# Patient Record
Sex: Female | Born: 1950
Health system: Southern US, Community
[De-identification: ages and names within clinical notes are randomized; demographics above are authoritative.]

## PROBLEM LIST (undated history)

## (undated) DIAGNOSIS — F32A Depression, unspecified: Secondary | ICD-10-CM

## (undated) DIAGNOSIS — K635 Polyp of colon: Secondary | ICD-10-CM

## (undated) DIAGNOSIS — I10 Essential (primary) hypertension: Secondary | ICD-10-CM

## (undated) DIAGNOSIS — G4700 Insomnia, unspecified: Secondary | ICD-10-CM

## (undated) DIAGNOSIS — F329 Major depressive disorder, single episode, unspecified: Secondary | ICD-10-CM

## (undated) DIAGNOSIS — K219 Gastro-esophageal reflux disease without esophagitis: Secondary | ICD-10-CM

## (undated) DIAGNOSIS — G43909 Migraine, unspecified, not intractable, without status migrainosus: Secondary | ICD-10-CM

## (undated) DIAGNOSIS — F419 Anxiety disorder, unspecified: Secondary | ICD-10-CM

## (undated) DIAGNOSIS — M810 Age-related osteoporosis without current pathological fracture: Secondary | ICD-10-CM

## (undated) DIAGNOSIS — F1320 Sedative, hypnotic or anxiolytic dependence, uncomplicated: Secondary | ICD-10-CM

## (undated) DIAGNOSIS — I2699 Other pulmonary embolism without acute cor pulmonale: Secondary | ICD-10-CM

## (undated) DIAGNOSIS — N3281 Overactive bladder: Secondary | ICD-10-CM

## (undated) DIAGNOSIS — J449 Chronic obstructive pulmonary disease, unspecified: Secondary | ICD-10-CM

## (undated) DIAGNOSIS — M1990 Unspecified osteoarthritis, unspecified site: Secondary | ICD-10-CM

## (undated) DIAGNOSIS — K5900 Constipation, unspecified: Secondary | ICD-10-CM

## (undated) DIAGNOSIS — E785 Hyperlipidemia, unspecified: Secondary | ICD-10-CM

## (undated) DIAGNOSIS — E890 Postprocedural hypothyroidism: Secondary | ICD-10-CM

## (undated) DIAGNOSIS — M419 Scoliosis, unspecified: Secondary | ICD-10-CM

## (undated) HISTORY — DX: Depression, unspecified: F32.A

## (undated) HISTORY — DX: Essential (primary) hypertension: I10

## (undated) HISTORY — DX: Insomnia, unspecified: G47.00

## (undated) HISTORY — DX: Unspecified osteoarthritis, unspecified site: M19.90

## (undated) HISTORY — DX: Migraine, unspecified, not intractable, without status migrainosus: G43.909

## (undated) HISTORY — PX: HEMORRHOID SURGERY: SHX153

## (undated) HISTORY — DX: Chronic obstructive pulmonary disease, unspecified: J44.9

## (undated) HISTORY — DX: Other pulmonary embolism without acute cor pulmonale: I26.99

## (undated) HISTORY — DX: Major depressive disorder, single episode, unspecified: F32.9

## (undated) HISTORY — DX: Sedative, hypnotic or anxiolytic dependence, uncomplicated: F13.20

## (undated) HISTORY — DX: Scoliosis, unspecified: M41.9

## (undated) HISTORY — DX: Polyp of colon: K63.5

## (undated) HISTORY — DX: Hyperlipidemia, unspecified: E78.5

## (undated) HISTORY — DX: Gastro-esophageal reflux disease without esophagitis: K21.9

## (undated) HISTORY — DX: Postprocedural hypothyroidism: E89.0

## (undated) HISTORY — PX: BREAST CYST ASPIRATION: SHX578

## (undated) HISTORY — DX: Anxiety disorder, unspecified: F41.9

## (undated) HISTORY — DX: Age-related osteoporosis without current pathological fracture: M81.0

## (undated) HISTORY — PX: THYROIDECTOMY: SHX17

## (undated) HISTORY — DX: Overactive bladder: N32.81

## (undated) HISTORY — DX: Constipation, unspecified: K59.00

---

## 1977-08-01 HISTORY — PX: TUBAL LIGATION: SHX77

## 1997-11-03 ENCOUNTER — Encounter: Admission: RE | Admit: 1997-11-03 | Discharge: 1998-02-01 | Payer: Self-pay | Admitting: Internal Medicine

## 1998-08-05 ENCOUNTER — Ambulatory Visit (HOSPITAL_COMMUNITY): Admission: RE | Admit: 1998-08-05 | Discharge: 1998-08-05 | Payer: Self-pay | Admitting: Family Medicine

## 1998-08-05 ENCOUNTER — Encounter: Payer: Self-pay | Admitting: Family Medicine

## 1998-09-08 ENCOUNTER — Ambulatory Visit (HOSPITAL_COMMUNITY): Admission: RE | Admit: 1998-09-08 | Discharge: 1998-09-08 | Payer: Self-pay | Admitting: Internal Medicine

## 1999-05-18 ENCOUNTER — Ambulatory Visit (HOSPITAL_COMMUNITY): Admission: RE | Admit: 1999-05-18 | Discharge: 1999-05-18 | Payer: Self-pay | Admitting: Family Medicine

## 1999-05-18 ENCOUNTER — Encounter: Payer: Self-pay | Admitting: Family Medicine

## 2000-07-10 ENCOUNTER — Emergency Department (HOSPITAL_COMMUNITY): Admission: EM | Admit: 2000-07-10 | Discharge: 2000-07-10 | Payer: Self-pay | Admitting: Emergency Medicine

## 2000-07-14 ENCOUNTER — Emergency Department (HOSPITAL_COMMUNITY): Admission: EM | Admit: 2000-07-14 | Discharge: 2000-07-14 | Payer: Self-pay | Admitting: Emergency Medicine

## 2000-07-14 ENCOUNTER — Encounter: Payer: Self-pay | Admitting: Emergency Medicine

## 2000-10-05 ENCOUNTER — Encounter: Payer: Self-pay | Admitting: Endocrinology

## 2000-10-05 ENCOUNTER — Ambulatory Visit (HOSPITAL_COMMUNITY): Admission: RE | Admit: 2000-10-05 | Discharge: 2000-10-05 | Payer: Self-pay | Admitting: Endocrinology

## 2001-02-13 ENCOUNTER — Emergency Department (HOSPITAL_COMMUNITY): Admission: EM | Admit: 2001-02-13 | Discharge: 2001-02-14 | Payer: Self-pay | Admitting: Emergency Medicine

## 2001-05-10 ENCOUNTER — Emergency Department (HOSPITAL_COMMUNITY): Admission: EM | Admit: 2001-05-10 | Discharge: 2001-05-10 | Payer: Self-pay | Admitting: Emergency Medicine

## 2007-12-24 ENCOUNTER — Emergency Department (HOSPITAL_COMMUNITY): Admission: EM | Admit: 2007-12-24 | Discharge: 2007-12-24 | Payer: Self-pay | Admitting: Emergency Medicine

## 2016-05-12 LAB — HM COLONOSCOPY

## 2016-12-07 DIAGNOSIS — G2581 Restless legs syndrome: Secondary | ICD-10-CM | POA: Insufficient documentation

## 2016-12-07 DIAGNOSIS — J449 Chronic obstructive pulmonary disease, unspecified: Secondary | ICD-10-CM | POA: Insufficient documentation

## 2016-12-07 DIAGNOSIS — F17210 Nicotine dependence, cigarettes, uncomplicated: Secondary | ICD-10-CM | POA: Insufficient documentation

## 2016-12-07 DIAGNOSIS — F411 Generalized anxiety disorder: Secondary | ICD-10-CM | POA: Insufficient documentation

## 2017-02-06 DIAGNOSIS — Z86010 Personal history of colonic polyps: Secondary | ICD-10-CM | POA: Insufficient documentation

## 2017-02-14 ENCOUNTER — Telehealth: Payer: Self-pay | Admitting: Family Medicine

## 2017-02-14 NOTE — Telephone Encounter (Signed)
Rcvd office notes, labs, colonoscopy from Dr Arnell AsalPaul Cohen

## 2017-04-11 ENCOUNTER — Ambulatory Visit: Payer: Self-pay | Admitting: Family Medicine

## 2017-04-20 ENCOUNTER — Ambulatory Visit: Payer: Self-pay | Admitting: Family Medicine

## 2017-05-02 ENCOUNTER — Ambulatory Visit (INDEPENDENT_AMBULATORY_CARE_PROVIDER_SITE_OTHER): Payer: Medicare Other | Admitting: Family Medicine

## 2017-05-02 ENCOUNTER — Encounter: Payer: Self-pay | Admitting: Family Medicine

## 2017-05-02 VITALS — BP 120/80 | HR 72 | Ht 59.5 in | Wt 131.0 lb

## 2017-05-02 DIAGNOSIS — F1320 Sedative, hypnotic or anxiolytic dependence, uncomplicated: Secondary | ICD-10-CM | POA: Diagnosis not present

## 2017-05-02 DIAGNOSIS — F17200 Nicotine dependence, unspecified, uncomplicated: Secondary | ICD-10-CM

## 2017-05-02 DIAGNOSIS — K635 Polyp of colon: Secondary | ICD-10-CM | POA: Insufficient documentation

## 2017-05-02 DIAGNOSIS — G4700 Insomnia, unspecified: Secondary | ICD-10-CM | POA: Insufficient documentation

## 2017-05-02 DIAGNOSIS — F32A Depression, unspecified: Secondary | ICD-10-CM | POA: Insufficient documentation

## 2017-05-02 DIAGNOSIS — Z720 Tobacco use: Secondary | ICD-10-CM | POA: Insufficient documentation

## 2017-05-02 DIAGNOSIS — E890 Postprocedural hypothyroidism: Secondary | ICD-10-CM | POA: Diagnosis not present

## 2017-05-02 DIAGNOSIS — K5900 Constipation, unspecified: Secondary | ICD-10-CM | POA: Insufficient documentation

## 2017-05-02 DIAGNOSIS — J449 Chronic obstructive pulmonary disease, unspecified: Secondary | ICD-10-CM | POA: Insufficient documentation

## 2017-05-02 DIAGNOSIS — N3281 Overactive bladder: Secondary | ICD-10-CM | POA: Diagnosis not present

## 2017-05-02 DIAGNOSIS — K5909 Other constipation: Secondary | ICD-10-CM | POA: Insufficient documentation

## 2017-05-02 DIAGNOSIS — G43909 Migraine, unspecified, not intractable, without status migrainosus: Secondary | ICD-10-CM | POA: Insufficient documentation

## 2017-05-02 DIAGNOSIS — I10 Essential (primary) hypertension: Secondary | ICD-10-CM | POA: Insufficient documentation

## 2017-05-02 DIAGNOSIS — K219 Gastro-esophageal reflux disease without esophagitis: Secondary | ICD-10-CM | POA: Diagnosis not present

## 2017-05-02 DIAGNOSIS — E785 Hyperlipidemia, unspecified: Secondary | ICD-10-CM | POA: Insufficient documentation

## 2017-05-02 DIAGNOSIS — Z7689 Persons encountering health services in other specified circumstances: Secondary | ICD-10-CM

## 2017-05-02 DIAGNOSIS — M1990 Unspecified osteoarthritis, unspecified site: Secondary | ICD-10-CM | POA: Insufficient documentation

## 2017-05-02 DIAGNOSIS — F329 Major depressive disorder, single episode, unspecified: Secondary | ICD-10-CM | POA: Insufficient documentation

## 2017-05-02 DIAGNOSIS — F419 Anxiety disorder, unspecified: Secondary | ICD-10-CM | POA: Insufficient documentation

## 2017-05-02 DIAGNOSIS — M419 Scoliosis, unspecified: Secondary | ICD-10-CM | POA: Insufficient documentation

## 2017-05-02 LAB — COMPREHENSIVE METABOLIC PANEL
AG RATIO: 1.8 (calc) (ref 1.0–2.5)
ALT: 9 U/L (ref 6–29)
AST: 15 U/L (ref 10–35)
Albumin: 4.4 g/dL (ref 3.6–5.1)
Alkaline phosphatase (APISO): 97 U/L (ref 33–130)
BILIRUBIN TOTAL: 0.4 mg/dL (ref 0.2–1.2)
BUN: 10 mg/dL (ref 7–25)
CALCIUM: 9.9 mg/dL (ref 8.6–10.4)
CO2: 25 mmol/L (ref 20–32)
Chloride: 96 mmol/L — ABNORMAL LOW (ref 98–110)
Creat: 0.78 mg/dL (ref 0.50–0.99)
GLUCOSE: 84 mg/dL (ref 65–99)
Globulin: 2.4 g/dL (calc) (ref 1.9–3.7)
Potassium: 3.8 mmol/L (ref 3.5–5.3)
Sodium: 132 mmol/L — ABNORMAL LOW (ref 135–146)
Total Protein: 6.8 g/dL (ref 6.1–8.1)

## 2017-05-02 LAB — CBC WITH DIFFERENTIAL/PLATELET
BASOS ABS: 22 {cells}/uL (ref 0–200)
Basophils Relative: 0.3 %
EOS ABS: 110 {cells}/uL (ref 15–500)
Eosinophils Relative: 1.5 %
HCT: 36.5 % (ref 35.0–45.0)
HEMOGLOBIN: 12.6 g/dL (ref 11.7–15.5)
Lymphs Abs: 1978 cells/uL (ref 850–3900)
MCH: 31.3 pg (ref 27.0–33.0)
MCHC: 34.5 g/dL (ref 32.0–36.0)
MCV: 90.8 fL (ref 80.0–100.0)
MONOS PCT: 5 %
MPV: 10.7 fL (ref 7.5–12.5)
NEUTROS ABS: 4825 {cells}/uL (ref 1500–7800)
NEUTROS PCT: 66.1 %
Platelets: 171 10*3/uL (ref 140–400)
RBC: 4.02 10*6/uL (ref 3.80–5.10)
RDW: 11.8 % (ref 11.0–15.0)
Total Lymphocyte: 27.1 %
WBC mixed population: 365 cells/uL (ref 200–950)
WBC: 7.3 10*3/uL (ref 3.8–10.8)

## 2017-05-02 LAB — LIPID PANEL
CHOLESTEROL: 200 mg/dL — AB (ref <200)
HDL: 53 mg/dL (ref >50)
LDL Cholesterol (Calc): 118 mg/dL (calc) — ABNORMAL HIGH
Non-HDL Cholesterol (Calc): 147 mg/dL (calc) — ABNORMAL HIGH (ref <130)
Total CHOL/HDL Ratio: 3.8 (calc) (ref <5.0)
Triglycerides: 175 mg/dL — ABNORMAL HIGH (ref <150)

## 2017-05-02 LAB — TSH: TSH: 0.48 mIU/L (ref 0.40–4.50)

## 2017-05-02 MED ORDER — ESOMEPRAZOLE MAGNESIUM 40 MG PO CPDR: 40.0000 mg | DELAYED_RELEASE_CAPSULE | Freq: Every day | ORAL | 3 refills | Status: DC

## 2017-05-02 NOTE — Progress Notes (Signed)
Subjective:    Patient ID: Brenda Dunn, female    DOB: 1951-07-28, 66 y.o.   MRN: [id number]  HPI Chief Complaint  Patient presents with  . new pt    new pt get established. needs refill on meds. would like thyroid checked and cholesterol checked. slow digestive problems.    She is new to the practice and is here to establish care.  Previous medical care: Dr. Iris Pert at [facility] PCP since May 2018 and prior to that was seeing Dr. Arnell Asal in [facility].  Last CPE: unknown.   Other providers: [facility] - Dr. Andrey Cota she would like to see a new GI when she is due for coloscopy and to help with her chronic constipation.  Urologist in [facility]  Podiatrist- here in town.   Has been referred to [facility] psychiatry and pain management in the past. Did not establish with either.    Dr. Corie Chiquito at [facility] is her psychiatrist. She saw her 2 months ago. She has an appointment later this month. She does not have a therapist and is not interested in seeing one.  History of anxiety and has been taking benzodiazepines for many years.   States she is having a lot of gas.  States she drinks a lot of Diet Coke, eats gas producing foods.   HTN- well controlled.  Hyperlipidemia - LDL within goal in June 2018  GERD- states she is not having indigestion. She would like to be switched back to Nexium.  Hypothyroidism post thyroidectomy - TSH and free T4 normal in July 2018 COPD- states she has been told she has mild emphysema. She was pulmonologist.  OAB- Myrbetriq seems to be helping. Requests refill because she states she cannot find her last 30 day supply and pharmacy said she is not due for a refill until November.   She is visibly upset about her son passing away years ago. She is tearful.   Social history: She is married and lives with husband, has been disables since 2010 for severe anxiety and scoliosis per medical record.   Smoker- is not willing to quit. States she has  been smoking since age 32. 1-2 ppd.   Immunizations: Prevnar 13 -09/26/2016  Health maintenance:  Mammogram: 05/2016  Colonoscopy: 05/2016. Polyps. Due back in 05/2019  Last Gynecological Exam: Pap smear has been years ago.  Mother with breast cancer.   Reviewed allergies, medications, past medical, surgical, family, and social history.   Review of Systems Pertinent positives and negatives in the history of present illness.     Objective:   Physical Exam BP 120/80   Pulse 72   Ht 4' 11.5" (1.511 m)   Wt 131 lb (59.4 kg)   BMI 26.02 kg/m  Alert and oriented and in no acute distress. Not otherwise examined.       Assessment & Plan:  Hypertension, unspecified type - Plan: CBC with Differential/Platelet, Comprehensive metabolic panel  Post-surgical hypothyroidism - Plan: TSH  Hyperlipidemia, unspecified hyperlipidemia type - Plan: Lipid panel  Benzodiazepine dependence (HCC)  Encounter to establish care  Smoker  OAB (overactive bladder)  Gastroesophageal reflux disease, esophagitis presence not specified - Plan: esomeprazole (NEXIUM) 40 MG capsule  HTN- well controlled and at goal today. Continue on current medications.  Will check thyroid function and change medication dose if needed.  She is fasting today, lipid panel checked per patient request.  Smoking cessation counseling done and she is not ready to stop.  Myrbetriq  sample given #7 tablets. Will refill this as appropriate.  GERD- counseled on reducing gas producing foods and carbonated beverages since this gives her more gas. Stop omeprazole and start Nexium per patient request.  She is aware that I am not interested in treating her chronic anxiety, sleep issues or other psychiatric issues. She will continue seeing her psychiatrist for this. She is dependent on benzodiazepines and verbalized understanding that I will not prescribe these for her.  Offered counseling for anxiety and she declines.  Follow up  pending labs.  She is also due for an AWV. She will schedule this.  Spent a minimum of 45 minutes face to face with patient and at least 50% was in counseling and coordination of care.

## 2017-05-02 NOTE — Patient Instructions (Addendum)
Stop the Omeprazole and start Nexium. Try to avoid carbonated beverages and high gas producing foods such as raw vegetables, onions, beans, cabbages. Avoid eating and laying down.  Smoking also makes reflux symptoms worse.  Let me know if you are ready to stop smoking and we can help.   Return in the next few months for your annual wellness visit.   We will call you with lab results.

## 2017-05-05 ENCOUNTER — Telehealth: Payer: Self-pay

## 2017-05-05 NOTE — Telephone Encounter (Signed)
Records from [facility] placed in your folder for review. Trixie Rude

## 2017-05-08 ENCOUNTER — Encounter: Payer: Self-pay | Admitting: Family Medicine

## 2017-05-10 ENCOUNTER — Ambulatory Visit (INDEPENDENT_AMBULATORY_CARE_PROVIDER_SITE_OTHER): Payer: Medicare Other | Admitting: Family Medicine

## 2017-05-10 ENCOUNTER — Encounter: Payer: Self-pay | Admitting: Family Medicine

## 2017-05-10 VITALS — BP 124/80 | HR 69 | Wt 133.0 lb

## 2017-05-10 DIAGNOSIS — E871 Hypo-osmolality and hyponatremia: Secondary | ICD-10-CM | POA: Diagnosis not present

## 2017-05-10 DIAGNOSIS — R059 Cough, unspecified: Secondary | ICD-10-CM

## 2017-05-10 DIAGNOSIS — R05 Cough: Secondary | ICD-10-CM

## 2017-05-10 DIAGNOSIS — E785 Hyperlipidemia, unspecified: Secondary | ICD-10-CM

## 2017-05-10 LAB — POCT URINALYSIS DIP (PROADVANTAGE DEVICE)
BILIRUBIN UA: NEGATIVE mg/dL
Bilirubin, UA: NEGATIVE
Glucose, UA: 500 mg/dL — AB
Nitrite, UA: NEGATIVE
PH UA: 6 (ref 5.0–8.0)
PROTEIN UA: NEGATIVE mg/dL
RBC UA: NEGATIVE
SPECIFIC GRAVITY, URINE: 1.02
UUROB: NEGATIVE

## 2017-05-10 NOTE — Progress Notes (Signed)
   Subjective:    Patient ID: Brenda Dunn, female    DOB: [birth year], 66 y.o.   MRN: [id number]  HPI Chief Complaint  Patient presents with  . discuss lab    discuss labs, increased water intake   She is here to discuss hyponatremia and hyperlipidemia.   Sodium was 132 at her last visit without previous history of hyponatremia per patient.    Her LDL was 118 and above goal. Denies missing any doses of atorvastatin.   States she is drinking approximately 16 ounces of water, 2-3 sodas per day and 6-8 cups of coffee in the morning,1-2 cups in the afternoon. States she does not eat a lot of salt.   Denies fever, chills, confusion, fatigue, dizziness, chest pain, palpitations, shortness of breath, abdominal pain, N/V/D, urinary symptoms, LE edema, muscle weakness or cramping.   States she has been coughing for the past 3-4 days and thinks she has bronchitis. Cough is dry. She used her albuterol inhaler 2 times daily for the past 2 days and states she does not usually need it.  History of "mild" COPD per patient.  She is a smoker.  States she has not had bronchitis in years.   Denies rhinorrhea, nasal congestion, ear pain, sore throat.   States she has seen an allergist in the past and states she has allergy to ragweed.   Walks 3-4 days per week.  She is concerned about weight gain but states she has not noticed any difference in her clothing sizes.   Reviewed allergies, medications, past medical, surgical, and social history.   Review of Systems Pertinent positives and negatives in the history of present illness.     Objective:   Physical Exam BP 124/80   Pulse 69   Wt 133 lb (60.3 kg)   BMI 26.41 kg/m   Alert and in no distress. No sinus tenderness. Nares with erythema, no edema or drainage. Tympanic membranes and canals are normal. Pharyngeal area is normal. Neck is supple without adenopathy or thyromegaly. Cardiac exam shows a regular sinus rhythm without murmurs or  gallops. Lungs are clear to auscultation. extremities without edema, normal pulses. Skin is warm and dry, no pallor or rash. PERRLA.  CN II-IX intact. No tremor. MSK strength is normal. DTRs normal.      Assessment & Plan:  Hyponatremia - Plan: POCT Urinalysis DIP (Proadvantage Device), Osmolality, Osmolality, urine, Hemoglobin A1c, BASIC METABOLIC PANEL WITH GFR  Cough  Hyperlipidemia, unspecified hyperlipidemia type  Counseled on cutting back on daily fluids and increasing her salt intake. She is euvolemic.  Will recheck BMP. Plan to look for underlying etiologies for hyponatremia but I suspect this is due to her significant fluid intake and low sodium diet.  Advised her to eat a low cholesterol diet, increase physical activity and continue on statin. She is not at goal and we will follow up on this in 6 months.  She appears to have a cold at this point. Recommend OTC medication with Mucinex. She will follow up if she worsens or if she is not improving in the next 2-3 days.

## 2017-05-10 NOTE — Patient Instructions (Signed)
Take Mucinex for cough and congestion. Continue using albuterol inhaler as needed.  If your cough gets worse or if you develop fever any new symptoms over the next week then let me know.   We will call you with lab results. In the meantime, increase your salt intake and cut back some on the fluids you are drinking.

## 2017-05-11 LAB — BASIC METABOLIC PANEL WITH GFR
BUN: 10 mg/dL (ref 7–25)
CHLORIDE: 93 mmol/L — AB (ref 98–110)
CO2: 24 mmol/L (ref 20–32)
Calcium: 9.3 mg/dL (ref 8.6–10.4)
Creat: 0.72 mg/dL (ref 0.50–0.99)
GFR, EST AFRICAN AMERICAN: 102 mL/min/{1.73_m2} (ref >=60)
GFR, Est Non African American: 88 mL/min/{1.73_m2} (ref >=60)
GLUCOSE: 75 mg/dL (ref 65–99)
POTASSIUM: 4.4 mmol/L (ref 3.5–5.3)
SODIUM: 126 mmol/L — AB (ref 135–146)

## 2017-05-11 LAB — HEMOGLOBIN A1C
HEMOGLOBIN A1C: 5.1 %{Hb} (ref <5.7)
Mean Plasma Glucose: 100 (calc)
eAG (mmol/L): 5.5 (calc)

## 2017-05-11 LAB — OSMOLALITY, URINE: OSMOLALITY UR: 376 mosm/kg (ref 50–1200)

## 2017-05-11 LAB — OSMOLALITY: Osmolality: 260 mOsm/kg — ABNORMAL LOW (ref 278–305)

## 2017-05-12 ENCOUNTER — Telehealth: Payer: Self-pay

## 2017-05-12 MED ORDER — AZITHROMYCIN 250 MG PO TABS: ORAL_TABLET | ORAL | 0 refills | Status: DC

## 2017-05-12 MED ORDER — PROMETHAZINE-DM 6.25-15 MG/5ML PO SYRP: 5.0000 mL | ORAL_SOLUTION | Freq: Every evening | ORAL | 0 refills | Status: DC | PRN

## 2017-05-12 NOTE — Telephone Encounter (Signed)
Pt can take zithromax and wants something for cough as she can not sleep. Per vickie ok to send in promethazine DM cough syrup

## 2017-05-12 NOTE — Telephone Encounter (Signed)
Pt states that she is getting worse. She states she is unable to get out and come to the office and requesting something be called in for her cough. She is not able to sleep at night and is exhausted because she is not able to rest. She is trying Mucinex and inhalers as directed. Trixie Rude

## 2017-05-12 NOTE — Telephone Encounter (Signed)
Please find out if she can take Zithromax (Z-pack) antibiotic. If so please send this in for her. Have her follow up if she is not improving after completing the antibiotic. If she develops fever or shortness of breath over the weekend she may have to go to urgent care.

## 2017-05-13 ENCOUNTER — Emergency Department (HOSPITAL_BASED_OUTPATIENT_CLINIC_OR_DEPARTMENT_OTHER)
Admission: EM | Admit: 2017-05-13 | Discharge: 2017-05-13 | Disposition: A | Discharge status: Home / Self Care | Payer: Medicare Other | Attending: Emergency Medicine | Admitting: Emergency Medicine

## 2017-05-13 ENCOUNTER — Emergency Department (HOSPITAL_BASED_OUTPATIENT_CLINIC_OR_DEPARTMENT_OTHER): Payer: Medicare Other

## 2017-05-13 ENCOUNTER — Encounter (HOSPITAL_BASED_OUTPATIENT_CLINIC_OR_DEPARTMENT_OTHER): Payer: Self-pay | Admitting: Emergency Medicine

## 2017-05-13 DIAGNOSIS — F17200 Nicotine dependence, unspecified, uncomplicated: Secondary | ICD-10-CM | POA: Diagnosis not present

## 2017-05-13 DIAGNOSIS — J189 Pneumonia, unspecified organism: Secondary | ICD-10-CM | POA: Diagnosis not present

## 2017-05-13 DIAGNOSIS — F419 Anxiety disorder, unspecified: Secondary | ICD-10-CM | POA: Diagnosis not present

## 2017-05-13 DIAGNOSIS — Z79899 Other long term (current) drug therapy: Secondary | ICD-10-CM | POA: Insufficient documentation

## 2017-05-13 DIAGNOSIS — M419 Scoliosis, unspecified: Secondary | ICD-10-CM | POA: Insufficient documentation

## 2017-05-13 DIAGNOSIS — J449 Chronic obstructive pulmonary disease, unspecified: Secondary | ICD-10-CM | POA: Insufficient documentation

## 2017-05-13 DIAGNOSIS — I10 Essential (primary) hypertension: Secondary | ICD-10-CM | POA: Insufficient documentation

## 2017-05-13 DIAGNOSIS — F329 Major depressive disorder, single episode, unspecified: Secondary | ICD-10-CM | POA: Insufficient documentation

## 2017-05-13 DIAGNOSIS — Z720 Tobacco use: Secondary | ICD-10-CM

## 2017-05-13 DIAGNOSIS — J181 Lobar pneumonia, unspecified organism: Secondary | ICD-10-CM

## 2017-05-13 DIAGNOSIS — R0602 Shortness of breath: Secondary | ICD-10-CM | POA: Diagnosis present

## 2017-05-13 LAB — I-STAT VENOUS BLOOD GAS, ED
ACID-BASE EXCESS: 1 mmol/L (ref 0.0–2.0)
Bicarbonate: 25.1 mmol/L (ref 20.0–28.0)
O2 Saturation: 92 %
TCO2: 26 mmol/L (ref 22–32)
pCO2, Ven: 36.4 mmHg — ABNORMAL LOW (ref 44.0–60.0)
pH, Ven: 7.445 — ABNORMAL HIGH (ref 7.250–7.430)
pO2, Ven: 60 mmHg — ABNORMAL HIGH (ref 32.0–45.0)

## 2017-05-13 LAB — CBC
HCT: 35.4 % — ABNORMAL LOW (ref 36.0–46.0)
Hemoglobin: 11.8 g/dL — ABNORMAL LOW (ref 12.0–15.0)
MCH: 31.4 pg (ref 26.0–34.0)
MCHC: 33.3 g/dL (ref 30.0–36.0)
MCV: 94.1 fL (ref 78.0–100.0)
PLATELETS: 202 10*3/uL (ref 150–400)
RBC: 3.76 MIL/uL — AB (ref 3.87–5.11)
RDW: 12.8 % (ref 11.5–15.5)
WBC: 8.2 10*3/uL (ref 4.0–10.5)

## 2017-05-13 LAB — BRAIN NATRIURETIC PEPTIDE: B NATRIURETIC PEPTIDE 5: 13 pg/mL (ref 0.0–100.0)

## 2017-05-13 LAB — COMPREHENSIVE METABOLIC PANEL
ALK PHOS: 97 U/L (ref 38–126)
ALT: 18 U/L (ref 14–54)
AST: 29 U/L (ref 15–41)
Albumin: 4.4 g/dL (ref 3.5–5.0)
Anion gap: 11 (ref 5–15)
BILIRUBIN TOTAL: 0.5 mg/dL (ref 0.3–1.2)
BUN: 12 mg/dL (ref 6–20)
CALCIUM: 9.7 mg/dL (ref 8.9–10.3)
CO2: 25 mmol/L (ref 22–32)
CREATININE: 0.78 mg/dL (ref 0.44–1.00)
Chloride: 96 mmol/L — ABNORMAL LOW (ref 101–111)
GFR calc non Af Amer: >60 mL/min (ref >60)
Glucose, Bld: 101 mg/dL — ABNORMAL HIGH (ref 65–99)
Potassium: 4.1 mmol/L (ref 3.5–5.1)
SODIUM: 132 mmol/L — AB (ref 135–145)
Total Protein: 7.6 g/dL (ref 6.5–8.1)

## 2017-05-13 LAB — TROPONIN I: Troponin I: <0.03 ng/mL (ref <0.03)

## 2017-05-13 MED ORDER — DEXTROSE 5 % IV SOLN
1.0000 g | Freq: Once | INTRAVENOUS | Status: AC
  Administered 2017-05-13: 1 g via INTRAVENOUS
  Filled 2017-05-13: qty 10

## 2017-05-13 MED ORDER — MAGNESIUM SULFATE 2 GM/50ML IV SOLN
2.0000 g | Freq: Once | INTRAVENOUS | Status: AC
  Administered 2017-05-13: 2 g via INTRAVENOUS
  Filled 2017-05-13: qty 50

## 2017-05-13 MED ORDER — DOXYCYCLINE HYCLATE 100 MG PO CAPS: 100.0000 mg | ORAL_CAPSULE | Freq: Two times a day (BID) | ORAL | 0 refills | Status: DC

## 2017-05-13 MED ORDER — ALBUTEROL SULFATE (2.5 MG/3ML) 0.083% IN NEBU
INHALATION_SOLUTION | RESPIRATORY_TRACT | Status: AC
  Administered 2017-05-13: 2.5 mg
  Filled 2017-05-13: qty 3

## 2017-05-13 MED ORDER — HYDROCODONE-HOMATROPINE 5-1.5 MG/5ML PO SYRP: 5.0000 mL | ORAL_SOLUTION | Freq: Four times a day (QID) | ORAL | 0 refills | Status: DC | PRN

## 2017-05-13 MED ORDER — ALBUTEROL SULFATE (2.5 MG/3ML) 0.083% IN NEBU
5.0000 mg | INHALATION_SOLUTION | Freq: Once | RESPIRATORY_TRACT | Status: AC
  Administered 2017-05-13: 5 mg via RESPIRATORY_TRACT
  Filled 2017-05-13: qty 6

## 2017-05-13 MED ORDER — ALBUTEROL SULFATE HFA 108 (90 BASE) MCG/ACT IN AERS: 2.0000 | INHALATION_SPRAY | RESPIRATORY_TRACT | 1 refills | Status: DC | PRN

## 2017-05-13 MED ORDER — AEROCHAMBER PLUS W/MASK MISC
1.0000 | Freq: Once | Status: AC
  Administered 2017-05-13: 1
  Filled 2017-05-13: qty 1

## 2017-05-13 MED ORDER — IPRATROPIUM-ALBUTEROL 0.5-2.5 (3) MG/3ML IN SOLN
RESPIRATORY_TRACT | Status: AC
  Administered 2017-05-13: 3 mL
  Filled 2017-05-13: qty 3

## 2017-05-13 MED ORDER — METHYLPREDNISOLONE SODIUM SUCC 125 MG IJ SOLR
125.0000 mg | Freq: Once | INTRAMUSCULAR | Status: AC
  Administered 2017-05-13: 125 mg via INTRAVENOUS
  Filled 2017-05-13: qty 2

## 2017-05-13 NOTE — ED Triage Notes (Addendum)
Patient states that she has been SOB since wed. The patient has course cough, audible wheezing in triage. Patient unable to complete sentances in triage and has RA sat of 89 % after walking to triage

## 2017-05-13 NOTE — ED Notes (Signed)
Portable Xray arrived

## 2017-05-13 NOTE — ED Notes (Signed)
Right Radial Artery x 1 attempt unsuccessful.  Right Brachial Artery x 1 attempt. Bleeding stopped. No complications.

## 2017-05-13 NOTE — ED Provider Notes (Signed)
[facility] Provider Note   CSN: [id number] Arrival date & time: 05/13/17  4098     History   Chief Complaint Chief Complaint  Patient presents with  . Shortness of Breath    HPI Brenda Dunn is a verbose 66 y.o. female. Who presents with SOB.  She had onset of cough 1 week ago. Patient is a heavy smoker. She says she rarely has to use an inhaler however felt that she was getting bronchitis is weak and saw a nurse practitioner at her doctor's office who told her to take Mucinex. She had an inhaler at home and states that she has been using it sometimes almost every 15 minutes. She has a cough which is at times productive. She states she's been unable to sleep tonight due to coughing. She denies orthopnea or PND. She denies peripheral edema, chest pain. She complains of tightness and wheezing. She went to an urgent care prior to arrival this morning who sent her immediately to the ER secondary to 1-2 word speech. She arrived at the ER with 89% oxygen saturations on room air and only speaking and staccato to 2 word sentences. She denies fevers or chills. She has had a DuoNeb prior to my initial evaluation and is speaking much more clearly. Patient states is upset that she did not get codeine cough syrup which she feels may have prevented her acute flare. She is currently taking Ventolin, azithromycin.  HPI  Past Medical History:  Diagnosis Date  . Anxiety   . Benzodiazepine dependence (HCC)   . Colonic polyp    mulitple polyps. repeat in 05/2019  . Constipation   . COPD (chronic obstructive pulmonary disease) (HCC)   . Depression   . GERD (gastroesophageal reflux disease)   . Hyperlipidemia   . Hypertension   . Insomnia   . Migraine headache   . OAB (overactive bladder)   . Osteoarthritis   . Post-surgical hypothyroidism   . Scoliosis     Patient Active Problem List   Diagnosis Date Noted  . Smoker 05/02/2017  . Hypertension   . GERD (gastroesophageal reflux  disease)   . Anxiety   . Post-surgical hypothyroidism   . Hyperlipidemia   . Osteoarthritis   . Depression   . Constipation   . Scoliosis   . Migraine headache   . Benzodiazepine dependence (HCC)   . COPD (chronic obstructive pulmonary disease) (HCC)   . Insomnia   . OAB (overactive bladder)   . Colonic polyp     Past Surgical History:  Procedure Laterality Date  . HEMORRHOID SURGERY    . THYROIDECTOMY     at [facility]  . TUBAL LIGATION  1979    OB History    No data available       Home Medications    Prior to Admission medications   Medication Sig Start Date End Date Taking? Authorizing Provider  atorvastatin (LIPITOR) 40 MG tablet Take 40 mg by mouth daily.    [provider]  azithromycin (ZITHROMAX) 250 MG tablet Take 2 tablets day 1 and 1 tablet days 2-5 05/12/17   Henson, [name], [name]  diazepam (VALIUM) 5 MG tablet Take 5 mg by mouth. Take 1 tablet in the am, 1/2 tablet at lunch and 1 tablet at night    [provider]  Docusate Calcium (STOOL SOFTENER PO) Take 3 tablets by mouth daily.    [provider]  doxepin (SINEQUAN) 50 MG capsule Take 50 mg by  mouth at bedtime.    [provider]  esomeprazole (NEXIUM) 40 MG capsule Take 1 capsule (40 mg total) by mouth daily. 05/02/17   Henson, [name], [name]  gabapentin (NEURONTIN) 300 MG capsule Take 300 mg by mouth 3 (three) times daily.    [provider]  levothyroxine (SYNTHROID, LEVOTHROID) 100 MCG tablet Take 100 mcg by mouth daily before breakfast.    [provider]  linaclotide (LINZESS) 290 MCG CAPS capsule Take 290 mcg by mouth daily before breakfast.    [provider]  lisinopril (PRINIVIL,ZESTRIL) 40 MG tablet Take 40 mg by mouth daily.    [provider]  mirabegron ER (MYRBETRIQ) 50 MG TB24 tablet Take 50 mg by mouth daily.    [provider]  OXcarbazepine (TRILEPTAL) 150 MG tablet Take 450 mg by mouth at bedtime.    [provider]  promethazine-dextromethorphan (PROMETHAZINE-DM) 6.25-15 MG/5ML syrup Take 5 mLs by mouth at bedtime as needed for cough. 05/12/17   Avanell Shackleton, [name]    Family History History reviewed. No pertinent family history.  Social History Social History  Substance Use Topics  . Smoking status: Current Every Day Smoker    Packs/day: 1.50    Years: 44.00  . Smokeless tobacco: Never Used  . Alcohol use No     Allergies   Patient has no known allergies.   Review of Systems Review of Systems Ten systems reviewed and are negative for acute change, except as noted in the HPI. \  Physical Exam Updated Vital Signs BP 131/65   Pulse 87   Temp 98.7 F (37.1 C) (Oral)   Resp 18   Ht  (1.499 m)   Wt 60.3 kg (133 lb)   SpO2 91%   BMI 26.86 kg/m   Physical Exam  Constitutional: She is oriented to person, place, and time. She appears well-developed and well-nourished. No distress.  Smell of tobacco is overwhelming when entering the room.  HENT:  Head: Normocephalic and atraumatic.  Eyes: Conjunctivae are normal. No scleral icterus.  Neck: Normal range of motion.  Cardiovascular: Normal rate, regular rhythm and normal heart sounds.  Exam reveals no gallop and no friction rub.   No murmur heard. Pulmonary/Chest: No respiratory distress. She has wheezes.  Tight, diffuse expiratory wheezes. Poor air mvmt Prolonged expiratory phase. Tight cough. Speaking in full sentences  Abdominal: Soft. Bowel sounds are normal. She exhibits no distension and no mass. There is no tenderness. There is no guarding.  Neurological: She is alert and oriented to person, place, and time.  Skin: Skin is warm and dry. She is not diaphoretic.  Psychiatric: Her behavior is normal.  Nursing note and vitals reviewed.    ED Treatments / Results  Labs (all labs ordered are listed, but only abnormal results are displayed) Labs Reviewed  COMPREHENSIVE METABOLIC PANEL - Abnormal; Notable  for the following:       Result Value   Sodium 132 (*)    Chloride 96 (*)    Glucose, Bld 101 (*)    All other components within normal limits  CBC - Abnormal; Notable for the following:    RBC 3.76 (*)    Hemoglobin 11.8 (*)    HCT 35.4 (*)    All other components within normal limits  BRAIN NATRIURETIC PEPTIDE  TROPONIN I  BLOOD GAS, ARTERIAL  URINALYSIS, ROUTINE W REFLEX MICROSCOPIC    EKG  EKG Interpretation  Date/Time:  Saturday May 13 2017 10:21:14  EDT Ventricular Rate:  87 PR Interval:    QRS Duration: 106 QT Interval:  385 QTC Calculation: 464 R Axis:   -58 Text Interpretation:  Sinus rhythm Incomplete RBBB and LAFB Low voltage, precordial leads Abnormal R-wave progression, late transition No old tracing to compare Confirmed by [place], Doreatha Martin [phone]) on 05/13/2017 10:24:45 AM       Radiology Dg Chest Port 1 View  Result Date: 05/13/2017 CLINICAL DATA:  Cough and shortness of breath for the past week. Decreased oxygen saturation. EXAM: PORTABLE CHEST 1 VIEW COMPARISON:  None. FINDINGS: Normal cardiac silhouette and mediastinal contours. Minimal left basilar/retrocardiac heterogeneous opacities. No pleural effusion or pneumothorax. No evidence of edema. No acute osseus abnormalities. IMPRESSION: Minimal left basilar / retrocardiac opacities, atelectasis versus infiltrate. Further evaluation with a PA and lateral chest radiograph may be obtained as clinically indicated. Electronically Signed   By: Simonne Come M.D.   On: 05/13/2017 11:00    Procedures Procedures (including critical care time)  Medications Ordered in ED Medications  magnesium sulfate IVPB 2 g 50 mL (2 g Intravenous New Bag/Given 05/13/17 1046)  albuterol (PROVENTIL) (2.5 MG/3ML) 0.083% nebulizer solution 5 mg (not administered)  albuterol (PROVENTIL) (2.5 MG/3ML) 0.083% nebulizer solution (2.5 mg  Given 05/13/17 1019)  ipratropium-albuterol (DUONEB) 0.5-2.5 (3) MG/3ML nebulizer solution (3 mLs   Given 05/13/17 1019)  methylPREDNISolone sodium succinate (SOLU-MEDROL) 125 mg/2 mL injection 125 mg (125 mg Intravenous Given 05/13/17 1043)     Initial Impression / Assessment and Plan / ED Course  I have reviewed the triage vital signs and the nursing notes.  Pertinent labs & imaging results that were available during my care of the patient were reviewed by me and considered in my medical decision making (see chart for details).     Patient's port score 55 with confirmed lingular pneumonia on x-ray. Her breathing is significantly improved after intervention here in the emergency department. Patient given Rocephin. She is taking and 500 mg of azithromycin yesterday and 250 8. Patient will be switched to doxycycline. I have given her a spacer to use with her inhaler she has difficulty timing the intake. Patient also received Solu-Medrol and magnesium here in the emergency department. The patient will be discharged with hydrocodone cough syrup and a refill on her Ventolin inhaler. She is advised to follow closely with her PCP. I discussed her return precautions. The patient was counseled on the dangers of tobacco use, and was advised to quit.  Reviewed strategies to maximize success, including removing cigarettes and smoking materials from environment, stress management, substitution of other forms of reinforcement, support of family/friends and written materials.  Final Clinical Impressions(s) / ED Diagnoses   Final diagnoses:  Community acquired pneumonia of left lower lobe of lung (HCC)  Tobacco abuse    New Prescriptions New Prescriptions   No medications on file     Arthor Captain, PA-C 05/13/17 1404    Doug Sou, MD 05/13/17 1551

## 2017-05-13 NOTE — ED Notes (Signed)
ED Provider at bedside. 

## 2017-05-13 NOTE — ED Notes (Signed)
Pt discharged to home with family. NAD.  

## 2017-05-13 NOTE — Discharge Instructions (Signed)
Follow-up as directed with your primary care physician. Please please ask your primary care if he can help you to quit smoking. Follow up with emergency department for the reasons we discussed.   Contact a health care provider if: You have a fever. You are losing sleep because you cannot control your cough with cough medicine. Get help right away if: You have worsening shortness of breath. You have increased chest pain. Your sickness becomes worse, especially if you are an older adult or have a weakened immune system. You cough up blood

## 2017-05-13 NOTE — ED Provider Notes (Signed)
Complains of productive cough onset 4 days ago. No nausea or vomiting no known fever. Treated with her inhaler with transient relief however she states she's been using her inhaler more frequent than normal. On exam patient in no distress lungs with end expiratory wheezes, speaks in paragraphs Chest x-ray viewed by me   Doug Sou, MD 05/13/17 1550

## 2017-05-13 NOTE — ED Notes (Signed)
This is not a VBG. This is an arterial Blood Gas.

## 2017-05-13 NOTE — ED Notes (Signed)
Patient transported to X-ray 

## 2017-05-14 ENCOUNTER — Emergency Department (HOSPITAL_BASED_OUTPATIENT_CLINIC_OR_DEPARTMENT_OTHER)
Admission: EM | Admit: 2017-05-14 | Discharge: 2017-05-14 | Disposition: A | Discharge status: Home / Self Care | Payer: Medicare Other | Attending: Emergency Medicine | Admitting: Emergency Medicine

## 2017-05-14 ENCOUNTER — Encounter (HOSPITAL_BASED_OUTPATIENT_CLINIC_OR_DEPARTMENT_OTHER): Payer: Self-pay | Admitting: Emergency Medicine

## 2017-05-14 DIAGNOSIS — F17200 Nicotine dependence, unspecified, uncomplicated: Secondary | ICD-10-CM | POA: Insufficient documentation

## 2017-05-14 DIAGNOSIS — Z79899 Other long term (current) drug therapy: Secondary | ICD-10-CM | POA: Diagnosis not present

## 2017-05-14 DIAGNOSIS — J441 Chronic obstructive pulmonary disease with (acute) exacerbation: Secondary | ICD-10-CM | POA: Insufficient documentation

## 2017-05-14 DIAGNOSIS — E039 Hypothyroidism, unspecified: Secondary | ICD-10-CM | POA: Diagnosis not present

## 2017-05-14 DIAGNOSIS — I10 Essential (primary) hypertension: Secondary | ICD-10-CM | POA: Diagnosis not present

## 2017-05-14 DIAGNOSIS — R05 Cough: Secondary | ICD-10-CM | POA: Diagnosis present

## 2017-05-14 LAB — BASIC METABOLIC PANEL
Anion gap: 9 (ref 5–15)
BUN: 13 mg/dL (ref 6–20)
CHLORIDE: 94 mmol/L — AB (ref 101–111)
CO2: 24 mmol/L (ref 22–32)
Calcium: 9.4 mg/dL (ref 8.9–10.3)
Creatinine, Ser: 0.74 mg/dL (ref 0.44–1.00)
GFR calc non Af Amer: >60 mL/min (ref >60)
Glucose, Bld: 99 mg/dL (ref 65–99)
POTASSIUM: 3.8 mmol/L (ref 3.5–5.1)
SODIUM: 127 mmol/L — AB (ref 135–145)

## 2017-05-14 LAB — CBC WITH DIFFERENTIAL/PLATELET
BASOS ABS: 0 10*3/uL (ref 0.0–0.1)
BASOS PCT: 0 %
Eosinophils Absolute: 0.6 10*3/uL (ref 0.0–0.7)
Eosinophils Relative: 5 %
HCT: 31.8 % — ABNORMAL LOW (ref 36.0–46.0)
HEMOGLOBIN: 10.6 g/dL — AB (ref 12.0–15.0)
LYMPHS PCT: 19 %
Lymphs Abs: 2.2 10*3/uL (ref 0.7–4.0)
MCH: 31.5 pg (ref 26.0–34.0)
MCHC: 33.3 g/dL (ref 30.0–36.0)
MCV: 94.6 fL (ref 78.0–100.0)
MONOS PCT: 7 %
Monocytes Absolute: 0.8 10*3/uL (ref 0.1–1.0)
NEUTROS ABS: 8 10*3/uL — AB (ref 1.7–7.7)
NEUTROS PCT: 69 %
Platelets: 214 10*3/uL (ref 150–400)
RBC: 3.36 MIL/uL — ABNORMAL LOW (ref 3.87–5.11)
RDW: 12.6 % (ref 11.5–15.5)
WBC: 11.7 10*3/uL — ABNORMAL HIGH (ref 4.0–10.5)

## 2017-05-14 MED ORDER — IPRATROPIUM BROMIDE 0.02 % IN SOLN
0.5000 mg | Freq: Once | RESPIRATORY_TRACT | Status: AC
  Administered 2017-05-14: 0.5 mg via RESPIRATORY_TRACT
  Filled 2017-05-14: qty 2.5

## 2017-05-14 MED ORDER — PREDNISONE 10 MG PO TABS: 50.0000 mg | ORAL_TABLET | Freq: Every day | ORAL | 0 refills | Status: DC

## 2017-05-14 MED ORDER — PREDNISONE 50 MG PO TABS
60.0000 mg | ORAL_TABLET | Freq: Once | ORAL | Status: AC
  Administered 2017-05-14: 10:00:00 60 mg via ORAL
  Filled 2017-05-14: qty 1

## 2017-05-14 MED ORDER — ALBUTEROL SULFATE (2.5 MG/3ML) 0.083% IN NEBU
5.0000 mg | INHALATION_SOLUTION | Freq: Once | RESPIRATORY_TRACT | Status: AC
  Administered 2017-05-14: 5 mg via RESPIRATORY_TRACT
  Filled 2017-05-14: qty 6

## 2017-05-14 NOTE — ED Provider Notes (Signed)
[facility] Provider Note   CSN: [id number] Arrival date & time: 05/14/17  0907     History   Chief Complaint Chief Complaint  Patient presents with  . Cough    HPI Brenda Dunn is a 66 y.o. female.  The history is provided by the patient.  Cough  This is a new problem. The current episode started more than 2 days ago. The problem occurs constantly. The problem has not changed since onset.The cough is non-productive. There has been no fever. Associated symptoms include shortness of breath and wheezing. Pertinent negatives include no chest pain. She has tried cough syrup for the symptoms. The treatment provided no relief. She is a smoker. Her past medical history is significant for COPD.    66 year old female with chronic tobacco abuse and COPD who presents with cough and wheezing. She reports 4 days of cough and shortness of breath. Seen in ED yesterday, and diagnosed with COPD exacerbation and lingular pneumonia. She was discharged with inhaler, doxycyline. States that she was up all night with coughing and tried many sleeping medications but persistent cough. Denies fever, sputum, chest pain, LE edema, leg pain. States shortness of breath is better, but wants something for cough.   Past Medical History:  Diagnosis Date  . Anxiety   . Benzodiazepine dependence (HCC)   . Colonic polyp    mulitple polyps. repeat in 05/2019  . Constipation   . COPD (chronic obstructive pulmonary disease) (HCC)   . Depression   . GERD (gastroesophageal reflux disease)   . Hyperlipidemia   . Hypertension   . Insomnia   . Migraine headache   . OAB (overactive bladder)   . Osteoarthritis   . Post-surgical hypothyroidism   . Scoliosis     Patient Active Problem List   Diagnosis Date Noted  . Smoker 05/02/2017  . Hypertension   . GERD (gastroesophageal reflux disease)   . Anxiety   . Post-surgical hypothyroidism   . Hyperlipidemia   . Osteoarthritis   . Depression   .  Constipation   . Scoliosis   . Migraine headache   . Benzodiazepine dependence (HCC)   . COPD (chronic obstructive pulmonary disease) (HCC)   . Insomnia   . OAB (overactive bladder)   . Colonic polyp     Past Surgical History:  Procedure Laterality Date  . HEMORRHOID SURGERY    . THYROIDECTOMY     at [facility]  . TUBAL LIGATION  1979    OB History    No data available       Home Medications    Prior to Admission medications   Medication Sig Start Date End Date Taking? Authorizing Provider  albuterol (PROVENTIL HFA;VENTOLIN HFA) 108 (90 Base) MCG/ACT inhaler Inhale 2 puffs into the lungs every 4 (four) hours as needed for wheezing or shortness of breath. 05/13/17   Harris, Cammy Copa, PA-C  atorvastatin (LIPITOR) 40 MG tablet Take 40 mg by mouth daily.    [provider]  azithromycin (ZITHROMAX) 250 MG tablet Take 2 tablets day 1 and 1 tablet days 2-5 05/12/17   Henson, [name], [name]  diazepam (VALIUM) 5 MG tablet Take 5 mg by mouth. Take 1 tablet in the am, 1/2 tablet at lunch and 1 tablet at night    [provider]  Docusate Calcium (STOOL SOFTENER PO) Take 3 tablets by mouth daily.    [provider]  doxepin (SINEQUAN) 50 MG capsule Take 50 mg by mouth at  bedtime.    [provider]  doxycycline (VIBRAMYCIN) 100 MG capsule Take 1 capsule (100 mg total) by mouth 2 (two) times daily. 05/13/17   Arthor Captain, PA-C  esomeprazole (NEXIUM) 40 MG capsule Take 1 capsule (40 mg total) by mouth daily. 05/02/17   Henson, [name], [name]  gabapentin (NEURONTIN) 300 MG capsule Take 300 mg by mouth 3 (three) times daily.    [provider]  HYDROcodone-homatropine (HYCODAN) 5-1.5 MG/5ML syrup Take 5 mLs by mouth every 6 (six) hours as needed for cough. 05/13/17   Harris, Cammy Copa, PA-C  levothyroxine (SYNTHROID, LEVOTHROID) 100 MCG tablet Take 100 mcg by mouth daily before breakfast.    [provider]  linaclotide (LINZESS) 290 MCG CAPS  capsule Take 290 mcg by mouth daily before breakfast.    [provider]  lisinopril (PRINIVIL,ZESTRIL) 40 MG tablet Take 40 mg by mouth daily.    [provider]  mirabegron ER (MYRBETRIQ) 50 MG TB24 tablet Take 50 mg by mouth daily.    [provider]  OXcarbazepine (TRILEPTAL) 150 MG tablet Take 450 mg by mouth at bedtime.    [provider]  predniSONE (DELTASONE) 10 MG tablet Take 5 tablets (50 mg total) by mouth daily. 05/15/17 05/19/17  Lavera Guise, MD  promethazine-dextromethorphan (PROMETHAZINE-DM) 6.25-15 MG/5ML syrup Take 5 mLs by mouth at bedtime as needed for cough. 05/12/17   Avanell Shackleton, [name]    Family History History reviewed. No pertinent family history.  Social History Social History  Substance Use Topics  . Smoking status: Current Every Day Smoker    Packs/day: 1.50    Years: 44.00  . Smokeless tobacco: Never Used  . Alcohol use No     Allergies   Patient has no known allergies.   Review of Systems Review of Systems  Constitutional: Negative for fever.  Respiratory: Positive for cough, shortness of breath and wheezing.   Cardiovascular: Negative for chest pain.  All other systems reviewed and are negative.    Physical Exam Updated Vital Signs BP (!) 147/88 (BP Location: Right Arm)   Pulse 87   Temp 98.1 F (36.7 C) (Oral)   Resp (!) 22   Ht  (1.499 m)   Wt 60.3 kg (133 lb)   SpO2 95%   BMI 26.86 kg/m   Physical Exam Physical Exam  Nursing note and vitals reviewed. Constitutional: Non-toxic, and in no acute distress Head: Normocephalic and atraumatic.  Mouth/Throat: Oropharynx is clear and moist.  Neck: Normal range of motion. Neck supple.  Cardiovascular: Normal rate and regular rhythm.   Pulmonary/Chest: Effort normal. Speaks in full sentences. breath sounds with diffuse wheezing  Abdominal: Soft. There is no tenderness. There is no rebound and no guarding.  Musculoskeletal: Normal range of  motion.  Neurological: Alert, no facial droop, fluent speech, moves all extremities symmetrically Skin: Skin is warm and dry.  Psychiatric: Cooperative   ED Treatments / Results  Labs (all labs ordered are listed, but only abnormal results are displayed) Labs Reviewed  CBC WITH DIFFERENTIAL/PLATELET - Abnormal; Notable for the following:       Result Value   WBC 11.7 (*)    RBC 3.36 (*)    Hemoglobin 10.6 (*)    HCT 31.8 (*)    Neutro Abs 8.0 (*)    All other components within normal limits  BASIC METABOLIC PANEL - Abnormal; Notable for the following:    Sodium 127 (*)    Chloride 94 (*)  All other components within normal limits    EKG  EKG Interpretation None       Radiology Dg Chest 2 View  Result Date: 05/13/2017 CLINICAL DATA:  Shortness of breath, cough, wheezing EXAM: CHEST  2 VIEW COMPARISON:  05/13/2017 at 1022 hours FINDINGS: Lingular opacity, suspicious for pneumonia. Mild right basilar opacity, atelectasis versus pneumonia. No pleural effusion or pneumothorax. The heart is normal in size. Degenerative changes of the visualized thoracolumbar spine. IMPRESSION: Lingular opacity, suspicious for pneumonia. Mild right basilar opacity, atelectasis versus pneumonia. Electronically Signed   By: Charline Bills M.D.   On: 05/13/2017 12:00   Dg Chest Port 1 View  Result Date: 05/13/2017 CLINICAL DATA:  Cough and shortness of breath for the past week. Decreased oxygen saturation. EXAM: PORTABLE CHEST 1 VIEW COMPARISON:  None. FINDINGS: Normal cardiac silhouette and mediastinal contours. Minimal left basilar/retrocardiac heterogeneous opacities. No pleural effusion or pneumothorax. No evidence of edema. No acute osseus abnormalities. IMPRESSION: Minimal left basilar / retrocardiac opacities, atelectasis versus infiltrate. Further evaluation with a PA and lateral chest radiograph may be obtained as clinically indicated. Electronically Signed   By: Simonne Come M.D.   On:  05/13/2017 11:00    Procedures Procedures (including critical care time)  Medications Ordered in ED Medications  albuterol (PROVENTIL) (2.5 MG/3ML) 0.083% nebulizer solution 5 mg (5 mg Nebulization Given 05/14/17 1022)  ipratropium (ATROVENT) nebulizer solution 0.5 mg (0.5 mg Nebulization Given 05/14/17 1022)  predniSONE (DELTASONE) tablet 60 mg (60 mg Oral Given 05/14/17 1011)     Initial Impression / Assessment and Plan / ED Course  I have reviewed the triage vital signs and the nursing notes.  Pertinent labs & imaging results that were available during my care of the patient were reviewed by me and considered in my medical decision making (see chart for details).     Returns to ED for persistent cough after evaluation the ED yesterday for COPD exacerbation and pneumonia. She was seen ambulating from triage to her assigned ED room, without increased work of breathing. Pulse ox on arrival to her room was between 96 and 100% on room air.   She does have bronchospastic cough with diffuse expiratory wheezing on lung exam. Given albuterol and Atrovent times one, and wheezing now fully resolve. Coughing subsided. She feels significantly better. At this time it does not appear that her pneumonia has clinically worsened. She does have some hyponatremia of 127, worsened from 131 yesterday. However 4 days ago her sodium was 126 when she was seen in the PCPs office. Told to increase salt intake, which is reaffirmed with her. Does appear euvolemic here on exam. Mentation normal. She has scheduled follow-up with PCP in 2-3 days, where she will have repeat BMP.   Strict return and follow-up instructions reviewed. She expressed understanding of all discharge instructions and felt comfortable with the plan of care.  Smoking cessation instruction/counseling given:  counseled patient on the dangers of tobacco use, advised patient to stop smoking, and reviewed strategies to maximize success   Final  Clinical Impressions(s) / ED Diagnoses   Final diagnoses:  Acute exacerbation of chronic obstructive pulmonary disease (COPD) (HCC)    New Prescriptions New Prescriptions   PREDNISONE (DELTASONE) 10 MG TABLET    Take 5 tablets (50 mg total) by mouth daily.     Lavera Guise, MD 05/14/17 [phone]

## 2017-05-14 NOTE — ED Triage Notes (Signed)
Patient states that she has never had pneumonia before and she has had a cough all night. The patient is coughing sputum up but the "coughing is wearing me out"  - the patient reports that she used up her inhaler and feels like she is using it too much.

## 2017-05-14 NOTE — Discharge Instructions (Signed)
Please do your best to stop smoking as it is making her lung problems worse.  Or the next 24 hours give yourself 8 puffs of albuterol through your spacer every 3-4 hours scheduled. Then you can use her albuterol 2 puffs as needed every 4 hours for shortness of breath only.  Start taking your steroids tomorrow.finish taking her doxycycline.  Please follow-up with her PCP as scheduled on Wednesday. You can discuss with them about whether you need a nebulizer at home.  Please return without fail for worsening symptoms, including difficulty breathing, confusion, fevers, extreme fatigue, or any other symptoms concerning to you.

## 2017-05-18 ENCOUNTER — Encounter: Payer: Self-pay | Admitting: Family Medicine

## 2017-05-18 ENCOUNTER — Ambulatory Visit (INDEPENDENT_AMBULATORY_CARE_PROVIDER_SITE_OTHER): Payer: Medicare Other | Admitting: Family Medicine

## 2017-05-18 VITALS — BP 122/78 | HR 81 | Temp 97.8°F | Resp 16

## 2017-05-18 DIAGNOSIS — J189 Pneumonia, unspecified organism: Secondary | ICD-10-CM | POA: Diagnosis not present

## 2017-05-18 DIAGNOSIS — E871 Hypo-osmolality and hyponatremia: Secondary | ICD-10-CM | POA: Diagnosis not present

## 2017-05-18 LAB — POCT URINALYSIS DIP (PROADVANTAGE DEVICE)
BILIRUBIN UA: NEGATIVE mg/dL
Bilirubin, UA: NEGATIVE
Glucose, UA: NEGATIVE mg/dL
Leukocytes, UA: NEGATIVE
Nitrite, UA: NEGATIVE
PH UA: 6 (ref 5.0–8.0)
PROTEIN UA: NEGATIVE mg/dL
RBC UA: NEGATIVE
SPECIFIC GRAVITY, URINE: 1.02
UUROB: NEGATIVE

## 2017-05-18 MED ORDER — HYDROCODONE-HOMATROPINE 5-1.5 MG/5ML PO SYRP: 5.0000 mL | ORAL_SOLUTION | Freq: Four times a day (QID) | ORAL | 0 refills | Status: DC | PRN

## 2017-05-18 NOTE — Progress Notes (Signed)
   Subjective:    Patient ID: Brenda Dunn, female    DOB: 01/02/1951, 66 y.o.   MRN: [id number]  HPI Chief Complaint  Patient presents with  . follow-up    follow-up. decreased water intake   She is here for follow up on hyponatremia. States she has cut back on fluids and increased salt intake. Denies history of hyponatremia.   She was seen in the ED on 10/13 and again on 10/14 for cough and shortness of breath and is currently being treated for CAP with Doxycycline. She completed a course of steroids today. States she is still coughing but feels at least 75% better. She has DOE but improved somewhat. No new symptoms.  History of COPD. States this has been "mild". She is still smoking heavily and is not interested in stopping.  She was given Hycodan for cough by the ED provider and is requesting a refill.   Denies fever, chills, dizziness, chest pain, palpitations,  abdominal pain, N/V/D, urinary symptoms, LE edema.    Reviewed allergies, medications, past medical, surgical, and social history.   Review of Systems Pertinent positives and negatives in the history of present illness.     Objective:   Physical Exam BP 122/78   Pulse 81   Resp 16   SpO2 94%   Alert and in no distress. Pharyngeal area is erythematous. Neck is supple without adenopathy or thyromegaly. Cardiac exam shows a regular sinus rhythm without murmurs or gallops. Lungs with expiratory wheezing mainly in RUL and RLL and diminished in the bases. Normal work of breathing. Speaking in full sentences. Skin warm and dry, no pallor.       Assessment & Plan:  Hypo-osmolality and hyponatremia - Plan: POCT Urinalysis DIP (Proadvantage Device), Basic metabolic panel, Sodium, urine, random  Community acquired pneumonia, unspecified laterality  Discussed that there is no obvious etiology for hyponatremia at this point. Will check BMP and urine sodium. If hyponatremia persists, I plan to refer her to nephrology. Dr.  Susann GivensLalonde is in agreement.  Her breathing appears to be improving. She completed oral steroids today, will complete Doxycycline and follow up when she is at baseline to have PFTs and further testing for COPD. She will call or come in if she has any new or worsening symptoms.  Smoking cessation done.  Hycodan prescription given to patient.

## 2017-05-18 NOTE — Patient Instructions (Signed)
Follow up when you are back to your baseline breathing status so that we can do more testing in regards to your COPD.   If you get worse or are not back to baseline when you finish the antibiotic, let us know.   We will call you with your lab results.

## 2017-05-19 LAB — BASIC METABOLIC PANEL
BUN: 18 mg/dL (ref 7–25)
CO2: 25 mmol/L (ref 20–32)
CREATININE: 0.98 mg/dL (ref 0.50–0.99)
Calcium: 9.6 mg/dL (ref 8.6–10.4)
Chloride: 100 mmol/L (ref 98–110)
GLUCOSE: 128 mg/dL — AB (ref 65–99)
Potassium: 4.7 mmol/L (ref 3.5–5.3)
Sodium: 133 mmol/L — ABNORMAL LOW (ref 135–146)

## 2017-05-19 LAB — SODIUM, URINE, RANDOM: SODIUM UR: 47 mmol/L (ref 28–272)

## 2017-05-22 ENCOUNTER — Ambulatory Visit
Admission: RE | Admit: 2017-05-22 | Discharge: 2017-05-22 | Disposition: A | Discharge status: Home / Self Care | Payer: Medicare Other | Source: Ambulatory Visit | Attending: Family Medicine | Admitting: Family Medicine

## 2017-05-22 ENCOUNTER — Encounter: Payer: Self-pay | Admitting: Family Medicine

## 2017-05-22 ENCOUNTER — Ambulatory Visit (INDEPENDENT_AMBULATORY_CARE_PROVIDER_SITE_OTHER): Payer: Medicare Other | Admitting: Family Medicine

## 2017-05-22 VITALS — BP 110/70 | HR 78 | Temp 98.3°F | Resp 18

## 2017-05-22 DIAGNOSIS — R0602 Shortness of breath: Secondary | ICD-10-CM | POA: Diagnosis not present

## 2017-05-22 DIAGNOSIS — J189 Pneumonia, unspecified organism: Secondary | ICD-10-CM

## 2017-05-22 DIAGNOSIS — J441 Chronic obstructive pulmonary disease with (acute) exacerbation: Secondary | ICD-10-CM | POA: Diagnosis not present

## 2017-05-22 LAB — CBC WITH DIFFERENTIAL/PLATELET
BASOS PCT: 0.4 %
Basophils Absolute: 43 cells/uL (ref 0–200)
Eosinophils Absolute: 1220 cells/uL — ABNORMAL HIGH (ref 15–500)
Eosinophils Relative: 11.3 %
HEMATOCRIT: 35.7 % (ref 35.0–45.0)
HEMOGLOBIN: 12.2 g/dL (ref 11.7–15.5)
LYMPHS ABS: 2657 {cells}/uL (ref 850–3900)
MCH: 31.4 pg (ref 27.0–33.0)
MCHC: 34.2 g/dL (ref 32.0–36.0)
MCV: 91.8 fL (ref 80.0–100.0)
MPV: 9.5 fL (ref 7.5–12.5)
Monocytes Relative: 6.8 %
NEUTROS ABS: 6145 {cells}/uL (ref 1500–7800)
Neutrophils Relative %: 56.9 %
Platelets: 206 10*3/uL (ref 140–400)
RBC: 3.89 10*6/uL (ref 3.80–5.10)
RDW: 11.6 % (ref 11.0–15.0)
Total Lymphocyte: 24.6 %
WBC: 10.8 10*3/uL (ref 3.8–10.8)
WBCMIX: 734 {cells}/uL (ref 200–950)

## 2017-05-22 MED ORDER — LEVOFLOXACIN 500 MG PO TABS: 500.0000 mg | ORAL_TABLET | Freq: Every day | ORAL | 0 refills | Status: DC

## 2017-05-22 MED ORDER — ALBUTEROL SULFATE (2.5 MG/3ML) 0.083% IN NEBU
2.5000 mg | INHALATION_SOLUTION | Freq: Once | RESPIRATORY_TRACT | Status: AC
  Administered 2017-05-22: 2.5 mg via RESPIRATORY_TRACT

## 2017-05-22 MED ORDER — TIOTROPIUM BROMIDE-OLODATEROL 2.5-2.5 MCG/ACT IN AERS: 2.0000 | INHALATION_SPRAY | Freq: Every day | RESPIRATORY_TRACT | 0 refills | Status: DC

## 2017-05-22 NOTE — Patient Instructions (Signed)
Start the Levaquin today.   Use the Stiolto inhaler 2 puffs once daily.   Use the albuterol as prescribed.   We will call you with your lab and chest XR results.   If you get worse then you will need to go the emergency department or call 911.

## 2017-05-22 NOTE — Progress Notes (Signed)
Subjective:    Patient ID: Brenda Dunn, female    DOB: 01/04/1951, 66 y.o.   MRN: [id number]  HPI Chief Complaint  Patient presents with  . not feeling better    not feeling better, coughing alot, can't sleep,    She is here with complaints of worsening cough, shortness of breath, and fatigue.  States she finished Doxycycline yesterday and she is not any better.  States she is not worse but her SpO2 appears worse upon arrival. She completed a course of steroids approximately 5 days ago. She has been to the ED twice in the past 2 weeks for shortness of breath and cough.  Reports that she spilled her bottle of Hycodan over the weekend and she is unable to sleep due to cough.   Using her albuterol inhaler 2-3 times per day.   She is still smoking 1 ppd and there is an overwhelming odor of cigarette smoke in the room.   Denies fever, chills, dizziness, chest pain, palpitations, N/V/D.   Reviewed allergies, medications, past medical, surgical, and social history.   Review of Systems Pertinent positives and negatives in the history of present illness.     Objective:   Physical Exam  Constitutional: She is oriented to person, place, and time. She appears well-developed and well-nourished. No distress.  HENT:  Nose: Mucosal edema present.  Mouth/Throat: Uvula is midline, oropharynx is clear and moist and mucous membranes are normal.  Eyes: Pupils are equal, round, and reactive to light. Conjunctivae and lids are normal.  Neck: Full passive range of motion without pain. Neck supple. No JVD present.  Cardiovascular: Normal rate, regular rhythm and normal pulses.   No LE edema  Pulmonary/Chest: Effort normal. She has decreased breath sounds in the right lower field and the left lower field. She has wheezes.  Speaking in full sentences  Lymphadenopathy:    She has no cervical adenopathy.  Neurological: She is alert and oriented to person, place, and time. She has normal strength. No  cranial nerve deficit. Gait normal.  Skin: Skin is warm and dry. She is not diaphoretic. No cyanosis. No pallor.  Psychiatric: She has a normal mood and affect. Her speech is normal and behavior is normal. Thought content normal.   BP 110/70   Pulse 78   Temp 98.3 F (36.8 C) (Oral)   Resp 18   SpO2 90%       Assessment & Plan:  Community acquired pneumonia, unspecified laterality - Plan: albuterol (PROVENTIL) (2.5 MG/3ML) 0.083% nebulizer solution 2.5 mg, CBC with Differential/Platelet, DG Chest 2 View, levofloxacin (LEVAQUIN) 500 MG tablet  Shortness of breath - Plan: CBC with Differential/Platelet, DG Chest 2 View, levofloxacin (LEVAQUIN) 500 MG tablet  COPD with acute exacerbation (HCC)  Post breathing treatment SpO2 90% and patient reported improved breathing. She continues to have a course cough, no longer having audible wheezing but expiratory wheezes throughout persist.  Plan to start her on Levaquin. She has not improved with azithromycin or Doxycycline.  Discussed potential risk of QT elongation based on medications but benefit outweighs the risk. She will report any new symptoms Will check CBC and get a repeat chest XR.  She has known COPD but apparently this has not been treated in the past. Sample of Stiolto given with instructions for use. She will use her albuterol as prescribed.  Strict precautions to go to the ED or call 911 if her symptoms worsen. She and her husband verbalize understanding.  I will  follow up pending XR and lab results and see her back later in the week unless she needs to come in sooner.  Advised that I cannot refill her Hycodan even though she reports "spilling" the bottle. She may use OTC medications for cough. She declines anything without codeine in it and states it does not work for her.

## 2017-05-23 ENCOUNTER — Encounter (HOSPITAL_COMMUNITY): Payer: Self-pay

## 2017-05-23 ENCOUNTER — Emergency Department (HOSPITAL_COMMUNITY)
Admission: EM | Admit: 2017-05-23 | Discharge: 2017-05-23 | Disposition: A | Discharge status: Home / Self Care | Payer: Medicare Other | Attending: Emergency Medicine | Admitting: Emergency Medicine

## 2017-05-23 ENCOUNTER — Telehealth: Payer: Self-pay | Admitting: Internal Medicine

## 2017-05-23 DIAGNOSIS — J189 Pneumonia, unspecified organism: Secondary | ICD-10-CM

## 2017-05-23 DIAGNOSIS — Z79899 Other long term (current) drug therapy: Secondary | ICD-10-CM | POA: Insufficient documentation

## 2017-05-23 DIAGNOSIS — J449 Chronic obstructive pulmonary disease, unspecified: Secondary | ICD-10-CM | POA: Diagnosis not present

## 2017-05-23 DIAGNOSIS — F17210 Nicotine dependence, cigarettes, uncomplicated: Secondary | ICD-10-CM | POA: Diagnosis not present

## 2017-05-23 DIAGNOSIS — I10 Essential (primary) hypertension: Secondary | ICD-10-CM | POA: Diagnosis not present

## 2017-05-23 DIAGNOSIS — J181 Lobar pneumonia, unspecified organism: Secondary | ICD-10-CM | POA: Insufficient documentation

## 2017-05-23 DIAGNOSIS — R05 Cough: Secondary | ICD-10-CM | POA: Diagnosis present

## 2017-05-23 MED ORDER — ALBUTEROL SULFATE (2.5 MG/3ML) 0.083% IN NEBU
5.0000 mg | INHALATION_SOLUTION | Freq: Once | RESPIRATORY_TRACT | Status: AC
  Administered 2017-05-23: 5 mg via RESPIRATORY_TRACT

## 2017-05-23 MED ORDER — ALBUTEROL SULFATE (2.5 MG/3ML) 0.083% IN NEBU
INHALATION_SOLUTION | RESPIRATORY_TRACT | Status: DC
Start: 2017-05-23 — End: 2017-05-23
  Filled 2017-05-23: qty 3

## 2017-05-23 MED ORDER — HYDROCOD POLST-CPM POLST ER 10-8 MG/5ML PO SUER: 5.0000 mL | Freq: Every evening | ORAL | 0 refills | Status: DC | PRN

## 2017-05-23 MED ORDER — ALBUTEROL SULFATE HFA 108 (90 BASE) MCG/ACT IN AERS
6.0000 | INHALATION_SPRAY | Freq: Once | RESPIRATORY_TRACT | Status: AC
  Administered 2017-05-23: 6 via RESPIRATORY_TRACT
  Filled 2017-05-23: qty 6.7

## 2017-05-23 NOTE — Discharge Instructions (Signed)
Use your inhaler every 4 hours(6 puffs) while awake, return for sudden worsening shortness of breath, or if you need to use your inhaler more often.  ° °

## 2017-05-23 NOTE — ED Provider Notes (Signed)
[facility] EMERGENCY DEPARTMENT Provider Note   CSN: [id number] Arrival date & time: 05/23/17  1113     History   Chief Complaint Chief Complaint  Patient presents with  . Shortness of Breath    HPI Brenda Dunn is a 66 y.o. female.  66 year old female with a chief complaint of coughing. Going on for the past 10 days. The patient has not had any fevers or chills. This is her fourth palpation visit for the same. She has been on steroids as well as this is her second round of antibiotics. She started yesterday on Levaquin. Continues to have coughing fits having trouble sleeping due to coughing and became to the ED. She feels that she needs to be admitted. She has been out of her albuterol inhaler for the past couple weeks.    The history is provided by the patient.  Illness  This is a new problem. The current episode started more than 1 week ago. The problem occurs constantly. The problem has been gradually worsening. Associated symptoms include shortness of breath. Pertinent negatives include no chest pain and no headaches. Nothing aggravates the symptoms. Nothing relieves the symptoms. She has tried nothing for the symptoms. The treatment provided no relief.    Past Medical History:  Diagnosis Date  . Anxiety   . Benzodiazepine dependence (HCC)   . Colonic polyp    mulitple polyps. repeat in 05/2019  . Constipation   . COPD (chronic obstructive pulmonary disease) (HCC)   . Depression   . GERD (gastroesophageal reflux disease)   . Hyperlipidemia   . Hypertension   . Insomnia   . Migraine headache   . OAB (overactive bladder)   . Osteoarthritis   . Post-surgical hypothyroidism   . Scoliosis     Patient Active Problem List   Diagnosis Date Noted  . Hypo-osmolality and hyponatremia 05/18/2017  . Smoker 05/02/2017  . Hypertension   . GERD (gastroesophageal reflux disease)   . Anxiety   . Post-surgical hypothyroidism   . Hyperlipidemia   .  Osteoarthritis   . Depression   . Constipation   . Scoliosis   . Migraine headache   . Benzodiazepine dependence (HCC)   . COPD (chronic obstructive pulmonary disease) (HCC)   . Insomnia   . OAB (overactive bladder)   . Colonic polyp     Past Surgical History:  Procedure Laterality Date  . HEMORRHOID SURGERY    . THYROIDECTOMY     at [facility]  . TUBAL LIGATION  1979    OB History    No data available       Home Medications    Prior to Admission medications   Medication Sig Start Date End Date Taking? Authorizing Provider  albuterol (PROVENTIL HFA;VENTOLIN HFA) 108 (90 Base) MCG/ACT inhaler Inhale 2 puffs into the lungs every 4 (four) hours as needed for wheezing or shortness of breath. 05/13/17   Harris, Cammy Copa, PA-C  atorvastatin (LIPITOR) 40 MG tablet Take 40 mg by mouth daily.    [provider]  chlorpheniramine-HYDROcodone (TUSSIONEX PENNKINETIC ER) 10-8 MG/5ML SUER Take 5 mLs by mouth at bedtime as needed for cough. 05/23/17   Melene Plan, DO  diazepam (VALIUM) 5 MG tablet Take 5 mg by mouth. Take 1 tablet in the am, 1/2 tablet at lunch and 1 tablet at night    [provider]  Docusate Calcium (STOOL SOFTENER PO) Take 3 tablets by mouth daily.    [provider]  doxepin (  SINEQUAN) 50 MG capsule Take 50 mg by mouth at bedtime.    [provider]  doxycycline (VIBRAMYCIN) 100 MG capsule Take 1 capsule (100 mg total) by mouth 2 (two) times daily. 05/13/17   Arthor Captain, PA-C  esomeprazole (NEXIUM) 40 MG capsule Take 1 capsule (40 mg total) by mouth daily. 05/02/17   Henson, [name], [name]  gabapentin (NEURONTIN) 300 MG capsule Take 300 mg by mouth 3 (three) times daily.    [provider]  HYDROcodone-homatropine (HYCODAN) 5-1.5 MG/5ML syrup Take 5 mLs by mouth every 6 (six) hours as needed for cough. Patient not taking: Reported on 05/22/2017 05/18/17   Hetty Blend L, [name]  levofloxacin (LEVAQUIN) 500 MG tablet Take 1  tablet (500 mg total) by mouth daily. 05/22/17   Henson, [name], [name]  levothyroxine (SYNTHROID, LEVOTHROID) 100 MCG tablet Take 100 mcg by mouth daily before breakfast.    [provider]  linaclotide (LINZESS) 290 MCG CAPS capsule Take 290 mcg by mouth daily before breakfast.    [provider]  lisinopril (PRINIVIL,ZESTRIL) 40 MG tablet Take 40 mg by mouth daily.    [provider]  mirabegron ER (MYRBETRIQ) 50 MG TB24 tablet Take 50 mg by mouth daily.    [provider]  OXcarbazepine (TRILEPTAL) 150 MG tablet Take 450 mg by mouth at bedtime.    [provider]  Tiotropium Bromide-Olodaterol (STIOLTO RESPIMAT) 2.5-2.5 MCG/ACT AERS Inhale 2 puffs into the lungs daily. 05/22/17   Avanell Shackleton, [name]    Family History No family history on file.  Social History Social History  Substance Use Topics  . Smoking status: Current Every Day Smoker    Packs/day: 1.50    Years: 44.00  . Smokeless tobacco: Never Used  . Alcohol use No     Allergies   Patient has no known allergies.   Review of Systems Review of Systems  Constitutional: Negative for chills and fever.  HENT: Positive for congestion and voice change. Negative for rhinorrhea.   Eyes: Negative for redness and visual disturbance.  Respiratory: Positive for cough and shortness of breath. Negative for wheezing.   Cardiovascular: Negative for chest pain and palpitations.  Gastrointestinal: Negative for nausea and vomiting.  Genitourinary: Negative for dysuria and urgency.  Musculoskeletal: Negative for arthralgias and myalgias.  Skin: Negative for pallor and wound.  Neurological: Negative for dizziness and headaches.     Physical Exam Updated Vital Signs BP 104/71   Pulse 80   Temp 97.8 F (36.6 C) (Oral)   Resp 15   Wt 60.3 kg (133 lb)   SpO2 94%   BMI 26.86 kg/m   Physical Exam  Constitutional: She is oriented to person, place, and time. She appears  well-developed and well-nourished. No distress.  HENT:  Head: Normocephalic and atraumatic.  Eyes: Pupils are equal, round, and reactive to light. EOM are normal.  Neck: Normal range of motion. Neck supple.  Cardiovascular: Normal rate and regular rhythm.  Exam reveals no gallop and no friction rub.   No murmur heard. Pulmonary/Chest: Effort normal. She has wheezes (end expiratory, diffuse). She has no rales.  Abdominal: Soft. She exhibits no distension and no mass. There is no tenderness. There is no guarding.  Musculoskeletal: She exhibits no edema or tenderness.  Neurological: She is alert and oriented to person, place, and time.  Skin: Skin is warm and dry. She is not diaphoretic.  Psychiatric: She has a normal mood and affect. Her behavior is normal.  Nursing note and vitals reviewed.    ED Treatments / Results  Labs (all labs ordered are listed, but only abnormal results are displayed) Labs Reviewed - No data to display  EKG  EKG Interpretation  Date/Time:  Tuesday May 23 2017 15:21:57 EDT Ventricular Rate:  81 PR Interval:    QRS Duration: 106 QT Interval:  385 QTC Calculation: 447 R Axis:   -89 Text Interpretation:  Sinus rhythm LAD, consider left anterior fascicular block Low voltage, precordial leads Consider anterior infarct No significant change since last tracing Confirmed by Melene PlanFloyd, Taren Dymek [phone]([id number]) on 05/23/2017 3:24:12 PM       Radiology Dg Chest 2 View  Result Date: 05/22/2017 CLINICAL DATA:  Cough and chest congestion. Recent pneumonia. Smoker. EXAM: CHEST  2 VIEW COMPARISON:  05/13/2017. FINDINGS: Normal sized heart. Small amount of linear density at both lung bases. Minimal airspace opacity in the lingula with a mild increase. Minimal patchy opacity at the right lung base. Thoracic spine degenerative changes and mild thoracolumbar scoliosis. IMPRESSION: 1. Minimally increased patchy opacity in the lingula suspicious for pneumonia. 2. Interval minimal patchy  opacity at the right lung base. This could represent atelectasis or pneumonia. 3. Mild linear atelectasis at both lung bases. Electronically Signed   By: Beckie SaltsSteven  Reid M.D.   On: 05/22/2017 13:50    Procedures Procedures (including critical care time)  Medications Ordered in ED Medications  albuterol (PROVENTIL) (2.5 MG/3ML) 0.083% nebulizer solution (  Not Given 05/23/17 1208)  albuterol (PROVENTIL) (2.5 MG/3ML) 0.083% nebulizer solution 5 mg (5 mg Nebulization Given 05/23/17 1208)  albuterol (PROVENTIL HFA;VENTOLIN HFA) 108 (90 Base) MCG/ACT inhaler 6 puff (6 puffs Inhalation Given 05/23/17 1543)     Initial Impression / Assessment and Plan / ED Course  I have reviewed the triage vital signs and the nursing notes.  Pertinent labs & imaging results that were available during my care of the patient were reviewed by me and considered in my medical decision making (see chart for details).     66 yo F With chief complaints of coughing. Diagnosis yesterday with pneumonia. Was changed to Levaquin. There is faint wheezes on my exam. No hypoxic. Well appearing,nontoxic. Patient has been without her albuterol inhaler as well as continues to smoke. I feel this is likely the cause of her continued coughing. We'll do a short course of the narcotic cough syrup to help with sleep. Given an albuterol inhaler. Discharge home.  3:47 PM:  I have discussed the diagnosis/risks/treatment options with the patient and family and believe the pt to be eligible for discharge home to follow-up with PCP. We also discussed returning to the ED immediately if new or worsening sx occur. We discussed the sx which are most concerning (e.g., sudden worsening sob, need to use inhaler more often than every 4 hours) that necessitate immediate return. Medications administered to the patient during their visit and any new prescriptions provided to the patient are listed below.  Medications given during this visit Medications    albuterol (PROVENTIL) (2.5 MG/3ML) 0.083% nebulizer solution (  Not Given 05/23/17 1208)  albuterol (PROVENTIL) (2.5 MG/3ML) 0.083% nebulizer solution 5 mg (5 mg Nebulization Given 05/23/17 1208)  albuterol (PROVENTIL HFA;VENTOLIN HFA) 108 (90 Base) MCG/ACT inhaler 6 puff (6 puffs Inhalation Given 05/23/17 1543)     The patient appears reasonably screen and/or stabilized for discharge and I doubt any other medical condition or other [facility] requiring further screening, evaluation, or treatment in the ED at this time prior  to discharge.    Final Clinical Impressions(s) / ED Diagnoses   Final diagnoses:  Community acquired pneumonia of left upper lobe of lung (HCC)    New Prescriptions New Prescriptions   CHLORPHENIRAMINE-HYDROCODONE (TUSSIONEX PENNKINETIC ER) 10-8 MG/5ML SUER    Take 5 mLs by mouth at bedtime as needed for cough.     Melene Plan, DO 05/23/17 [phone]

## 2017-05-23 NOTE — ED Notes (Signed)
Pt stable, ambulatory, states understanding of discharge instructions 

## 2017-05-23 NOTE — ED Triage Notes (Signed)
Pt arrives POV from home after being dx with pneumonia yesterday and started on inhaler and Levaquin. Pt states she has been sob and coughing since 10/14. She states this morning when she woke up she felt just as short of breath so she decided to come in.

## 2017-05-23 NOTE — Telephone Encounter (Signed)
That is fine. Thanks 

## 2017-05-23 NOTE — Telephone Encounter (Signed)
Just FYI- pt's husband called to let us know that she is worse than yesterday and thinks her oxygen level has dropped even more since being in the office and is taking her to the ER to be checked out.

## 2017-05-26 ENCOUNTER — Encounter: Payer: Self-pay | Admitting: Family Medicine

## 2017-05-26 ENCOUNTER — Ambulatory Visit (INDEPENDENT_AMBULATORY_CARE_PROVIDER_SITE_OTHER): Payer: Medicare Other | Admitting: Family Medicine

## 2017-05-26 VITALS — BP 120/74 | HR 80 | Temp 98.2°F | Resp 16

## 2017-05-26 DIAGNOSIS — J439 Emphysema, unspecified: Secondary | ICD-10-CM | POA: Diagnosis not present

## 2017-05-26 DIAGNOSIS — J189 Pneumonia, unspecified organism: Secondary | ICD-10-CM

## 2017-05-26 DIAGNOSIS — F17200 Nicotine dependence, unspecified, uncomplicated: Secondary | ICD-10-CM

## 2017-05-26 DIAGNOSIS — R143 Flatulence: Secondary | ICD-10-CM | POA: Diagnosis not present

## 2017-05-26 MED ORDER — TIOTROPIUM BROMIDE-OLODATEROL 2.5-2.5 MCG/ACT IN AERS: 2.0000 | INHALATION_SPRAY | Freq: Every day | RESPIRATORY_TRACT | 0 refills | Status: DC

## 2017-05-26 NOTE — Patient Instructions (Signed)
For gas- avoid foods that cause gas such as broccoli, cabbage, onions, beans, carbonated drinks, chewing gum, hard candy.  You can take over the counter gas medications. Take a probiotic.  Avoid eating and laying down.   You are well hydrated when your urine is a light yellow. If your urine is clear like water then you are too hydrated.   I am referring you to a pulmonologist, a lung specialist. They will call you to schedule an appointment.   Continue using the Stialto once daily inhaler for COPD maintenance. Use your albuterol inhaler as needed but not more than prescribed.   Stop smoking.

## 2017-05-26 NOTE — Progress Notes (Signed)
   Subjective:    Patient ID: Brenda Dunn, female    DOB: 06/26/1951, 66 y.o.   MRN: [id number]  HPI Chief Complaint  Patient presents with  . follow-up    follow-up on pneumonia   She is a 66 year old caucasian female with a history of COPD and a heavy smoking history who is here to follow up on recent CAP diagnosis. At her last visit here on 05/22/2017, she felt like she was having worsening shortness of breath and cough. She had taken Z-pak, oral steroids and Doxycycline so we started her on Levaquin. She is still taking Levaquin and symptoms seem to be improving. Continues having congestion and cough but shortness of breath is improving. We also started her on Stialto for COPD and she states she is doing fine on this.  Apparently she went to the ED on 05/23/2017 for the same symptoms and was given another albuterol inhaler and codeine cough syrup. She has had 3 different codeine cough syrups over the past 2 weeks.   Her husband states that her illness has not kept her "from going". States she is still very active.   COPD- diagnosed as "mild" per patient by her PCP in [place] in 2011 or prior. States she has never taken any maintenance medication for COPD. Has not established with a pulmonologist since moving to [place].   States her psychiatrist increased gabapentin to 4 times per day and she is sleeping better with this change.   Reports ongoing issue with gas. States she likes to eat onions, broccoli, salads and drinks carbonated beverages. She is chewing gum during our visit.  States she is taking gas X and Beano.   Denies fever, chills, chest pain, palpitations, abdominal pain, N/V/D. No constipation.   Reviewed allergies, medications, past medical, surgical, and social history.   Review of Systems Pertinent positives and negatives in the history of present illness.     Objective:   Physical Exam BP 120/74   Pulse 80   Temp 98.2 F (36.8 C) (Oral)   Resp 16    SpO2 93%   Alert and in no distress. Pharyngeal area is normal. Neck is supple without adenopathy or thyromegaly. Cardiac exam shows a regular sinus rhythm without murmurs or gallops. Lungs with expiratory wheezes throughout. Normal expansion and work of breathing.      Assessment & Plan:  Community acquired pneumonia, unspecified laterality - Plan: Ambulatory referral to Pulmonology  Pulmonary emphysema, unspecified emphysema type (HCC) - Plan: Ambulatory referral to Pulmonology  Smoker - Plan: Ambulatory referral to Pulmonology  Excessive gas  Reviewed recent ED visit notes and  Discussed that she appears to be doing better. She is afebrile and hemodynamically stable. Normal work of breathing and speaking in full sentences. No sign of distress.  History of COPD and at the time she established with us she was not been taking maintenance medication for this. Started her on  Stialto last week. She is using her albuterol as needed.  Smoking cessation counseling done.  Plan to refer to pulmonology to establish care and for further evaluation.  Counseled on avoiding gas producing foods and carbonated beverages.

## 2017-05-28 ENCOUNTER — Observation Stay (HOSPITAL_BASED_OUTPATIENT_CLINIC_OR_DEPARTMENT_OTHER)
Admission: EM | Admit: 2017-05-28 | Discharge: 2017-05-30 | Disposition: A | Discharge status: Home / Self Care | Payer: Medicare Other | Attending: Internal Medicine | Admitting: Internal Medicine

## 2017-05-28 ENCOUNTER — Encounter (HOSPITAL_BASED_OUTPATIENT_CLINIC_OR_DEPARTMENT_OTHER): Payer: Self-pay | Admitting: Emergency Medicine

## 2017-05-28 ENCOUNTER — Emergency Department (HOSPITAL_BASED_OUTPATIENT_CLINIC_OR_DEPARTMENT_OTHER): Payer: Medicare Other

## 2017-05-28 DIAGNOSIS — F1320 Sedative, hypnotic or anxiolytic dependence, uncomplicated: Secondary | ICD-10-CM | POA: Diagnosis not present

## 2017-05-28 DIAGNOSIS — J9621 Acute and chronic respiratory failure with hypoxia: Principal | ICD-10-CM | POA: Insufficient documentation

## 2017-05-28 DIAGNOSIS — Z720 Tobacco use: Secondary | ICD-10-CM | POA: Diagnosis present

## 2017-05-28 DIAGNOSIS — E871 Hypo-osmolality and hyponatremia: Secondary | ICD-10-CM | POA: Diagnosis not present

## 2017-05-28 DIAGNOSIS — J440 Chronic obstructive pulmonary disease with acute lower respiratory infection: Secondary | ICD-10-CM | POA: Insufficient documentation

## 2017-05-28 DIAGNOSIS — N3281 Overactive bladder: Secondary | ICD-10-CM | POA: Insufficient documentation

## 2017-05-28 DIAGNOSIS — E785 Hyperlipidemia, unspecified: Secondary | ICD-10-CM | POA: Diagnosis not present

## 2017-05-28 DIAGNOSIS — E0590 Thyrotoxicosis, unspecified without thyrotoxic crisis or storm: Secondary | ICD-10-CM | POA: Diagnosis not present

## 2017-05-28 DIAGNOSIS — F419 Anxiety disorder, unspecified: Secondary | ICD-10-CM | POA: Diagnosis not present

## 2017-05-28 DIAGNOSIS — K219 Gastro-esophageal reflux disease without esophagitis: Secondary | ICD-10-CM | POA: Diagnosis not present

## 2017-05-28 DIAGNOSIS — F17210 Nicotine dependence, cigarettes, uncomplicated: Secondary | ICD-10-CM | POA: Diagnosis not present

## 2017-05-28 DIAGNOSIS — D649 Anemia, unspecified: Secondary | ICD-10-CM | POA: Insufficient documentation

## 2017-05-28 DIAGNOSIS — J441 Chronic obstructive pulmonary disease with (acute) exacerbation: Secondary | ICD-10-CM

## 2017-05-28 DIAGNOSIS — J9620 Acute and chronic respiratory failure, unspecified whether with hypoxia or hypercapnia: Secondary | ICD-10-CM | POA: Diagnosis present

## 2017-05-28 DIAGNOSIS — J432 Centrilobular emphysema: Secondary | ICD-10-CM | POA: Insufficient documentation

## 2017-05-28 DIAGNOSIS — I10 Essential (primary) hypertension: Secondary | ICD-10-CM | POA: Diagnosis present

## 2017-05-28 DIAGNOSIS — Z79899 Other long term (current) drug therapy: Secondary | ICD-10-CM | POA: Diagnosis not present

## 2017-05-28 DIAGNOSIS — E890 Postprocedural hypothyroidism: Secondary | ICD-10-CM | POA: Diagnosis present

## 2017-05-28 DIAGNOSIS — F17200 Nicotine dependence, unspecified, uncomplicated: Secondary | ICD-10-CM | POA: Diagnosis not present

## 2017-05-28 LAB — CBC WITH DIFFERENTIAL/PLATELET
Basophils Absolute: 0 10*3/uL (ref 0.0–0.1)
Basophils Relative: 0 %
EOS ABS: 1.1 10*3/uL — AB (ref 0.0–0.7)
Eosinophils Relative: 14 %
HEMATOCRIT: 30.5 % — AB (ref 36.0–46.0)
HEMOGLOBIN: 10.4 g/dL — AB (ref 12.0–15.0)
LYMPHS ABS: 1.4 10*3/uL (ref 0.7–4.0)
Lymphocytes Relative: 18 %
MCH: 31.4 pg (ref 26.0–34.0)
MCHC: 34.1 g/dL (ref 30.0–36.0)
MCV: 92.1 fL (ref 78.0–100.0)
MONO ABS: 0.4 10*3/uL (ref 0.1–1.0)
MONOS PCT: 6 %
NEUTROS PCT: 62 %
Neutro Abs: 4.9 10*3/uL (ref 1.7–7.7)
Platelets: 164 10*3/uL (ref 150–400)
RBC: 3.31 MIL/uL — ABNORMAL LOW (ref 3.87–5.11)
RDW: 12.2 % (ref 11.5–15.5)
WBC: 7.8 10*3/uL (ref 4.0–10.5)

## 2017-05-28 LAB — BASIC METABOLIC PANEL
Anion gap: 8 (ref 5–15)
BUN: 9 mg/dL (ref 6–20)
CO2: 25 mmol/L (ref 22–32)
CREATININE: 0.72 mg/dL (ref 0.44–1.00)
Calcium: 9.2 mg/dL (ref 8.9–10.3)
Chloride: 89 mmol/L — ABNORMAL LOW (ref 101–111)
GFR calc Af Amer: >60 mL/min (ref >60)
GFR calc non Af Amer: >60 mL/min (ref >60)
Glucose, Bld: 90 mg/dL (ref 65–99)
Potassium: 3.9 mmol/L (ref 3.5–5.1)
SODIUM: 122 mmol/L — AB (ref 135–145)

## 2017-05-28 LAB — PROCALCITONIN: Procalcitonin: <0.1 ng/mL

## 2017-05-28 MED ORDER — ALBUTEROL SULFATE (2.5 MG/3ML) 0.083% IN NEBU: 2.5000 mg | INHALATION_SOLUTION | RESPIRATORY_TRACT | Status: DC | PRN

## 2017-05-28 MED ORDER — GABAPENTIN 300 MG PO CAPS
300.0000 mg | ORAL_CAPSULE | Freq: Three times a day (TID) | ORAL | Status: DC
  Administered 2017-05-28 – 2017-05-30 (×5): 300 mg via ORAL
  Filled 2017-05-28 (×5): qty 1

## 2017-05-28 MED ORDER — GUAIFENESIN-DM 100-10 MG/5ML PO SYRP
5.0000 mL | ORAL_SOLUTION | ORAL | Status: DC | PRN
  Administered 2017-05-29 (×2): 5 mL via ORAL
  Filled 2017-05-28 (×2): qty 10

## 2017-05-28 MED ORDER — PHENOL 1.4 % MT LIQD
1.0000 | OROMUCOSAL | Status: DC | PRN
  Administered 2017-05-28: 1 via OROMUCOSAL
  Filled 2017-05-28: qty 177

## 2017-05-28 MED ORDER — LEVOTHYROXINE SODIUM 100 MCG PO TABS
100.0000 ug | ORAL_TABLET | Freq: Every day | ORAL | Status: DC
  Administered 2017-05-29 – 2017-05-30 (×2): 100 ug via ORAL
  Filled 2017-05-28 (×2): qty 1

## 2017-05-28 MED ORDER — MAGNESIUM SULFATE 2 GM/50ML IV SOLN
2.0000 g | Freq: Once | INTRAVENOUS | Status: AC
  Administered 2017-05-28: 2 g via INTRAVENOUS
  Filled 2017-05-28: qty 50

## 2017-05-28 MED ORDER — ONDANSETRON HCL 4 MG/2ML IJ SOLN: 4.0000 mg | Freq: Four times a day (QID) | INTRAMUSCULAR | Status: DC | PRN

## 2017-05-28 MED ORDER — IPRATROPIUM BROMIDE 0.02 % IN SOLN
0.5000 mg | Freq: Once | RESPIRATORY_TRACT | Status: AC
  Administered 2017-05-28: 0.5 mg via RESPIRATORY_TRACT
  Filled 2017-05-28: qty 2.5

## 2017-05-28 MED ORDER — ZOLPIDEM TARTRATE 5 MG PO TABS: 5.0000 mg | ORAL_TABLET | Freq: Every evening | ORAL | Status: DC | PRN

## 2017-05-28 MED ORDER — PREDNISONE 50 MG PO TABS
60.0000 mg | ORAL_TABLET | Freq: Once | ORAL | Status: AC
  Administered 2017-05-28: 12:00:00 60 mg via ORAL
  Filled 2017-05-28: qty 1

## 2017-05-28 MED ORDER — DIAZEPAM 5 MG PO TABS
2.5000 mg | ORAL_TABLET | Freq: Every day | ORAL | Status: DC
  Administered 2017-05-29 – 2017-05-30 (×2): 2.5 mg via ORAL
  Filled 2017-05-28 (×2): qty 1

## 2017-05-28 MED ORDER — HYDROCOD POLST-CPM POLST ER 10-8 MG/5ML PO SUER
5.0000 mL | Freq: Once | ORAL | Status: AC
  Administered 2017-05-28: 5 mL via ORAL
  Filled 2017-05-28: qty 5

## 2017-05-28 MED ORDER — ENOXAPARIN SODIUM 40 MG/0.4ML ~~LOC~~ SOLN
40.0000 mg | SUBCUTANEOUS | Status: DC
  Administered 2017-05-28 – 2017-05-29 (×2): 40 mg via SUBCUTANEOUS
  Filled 2017-05-28 (×2): qty 0.4

## 2017-05-28 MED ORDER — MIRABEGRON ER 25 MG PO TB24
50.0000 mg | ORAL_TABLET | Freq: Every day | ORAL | Status: DC
  Administered 2017-05-29: 50 mg via ORAL
  Filled 2017-05-28 (×2): qty 2

## 2017-05-28 MED ORDER — SODIUM CHLORIDE 0.9 % IV BOLUS (SEPSIS)
1000.0000 mL | Freq: Once | INTRAVENOUS | Status: AC
  Administered 2017-05-28: 1000 mL via INTRAVENOUS

## 2017-05-28 MED ORDER — ACETAMINOPHEN 325 MG PO TABS: 650.0000 mg | ORAL_TABLET | Freq: Four times a day (QID) | ORAL | Status: DC | PRN

## 2017-05-28 MED ORDER — DIAZEPAM 5 MG PO TABS
5.0000 mg | ORAL_TABLET | Freq: Every day | ORAL | Status: DC
  Administered 2017-05-28 – 2017-05-29 (×2): 5 mg via ORAL
  Filled 2017-05-28 (×2): qty 1

## 2017-05-28 MED ORDER — ATORVASTATIN CALCIUM 40 MG PO TABS
40.0000 mg | ORAL_TABLET | Freq: Every day | ORAL | Status: DC
  Administered 2017-05-29 – 2017-05-30 (×2): 40 mg via ORAL
  Filled 2017-05-28 (×2): qty 1

## 2017-05-28 MED ORDER — LISINOPRIL 20 MG PO TABS
40.0000 mg | ORAL_TABLET | Freq: Every day | ORAL | Status: DC
  Administered 2017-05-29 – 2017-05-30 (×2): 40 mg via ORAL
  Filled 2017-05-28 (×2): qty 2

## 2017-05-28 MED ORDER — LINACLOTIDE 145 MCG PO CAPS
290.0000 ug | ORAL_CAPSULE | Freq: Every day | ORAL | Status: DC
  Administered 2017-05-28 – 2017-05-29 (×2): 290 ug via ORAL
  Filled 2017-05-28 (×2): qty 2

## 2017-05-28 MED ORDER — IPRATROPIUM-ALBUTEROL 0.5-2.5 (3) MG/3ML IN SOLN
3.0000 mL | Freq: Four times a day (QID) | RESPIRATORY_TRACT | Status: DC
  Administered 2017-05-28 – 2017-05-29 (×5): 3 mL via RESPIRATORY_TRACT
  Filled 2017-05-28 (×5): qty 3

## 2017-05-28 MED ORDER — DIAZEPAM 5 MG PO TABS: 5.0000 mg | ORAL_TABLET | ORAL | Status: DC

## 2017-05-28 MED ORDER — PANTOPRAZOLE SODIUM 40 MG PO TBEC
40.0000 mg | DELAYED_RELEASE_TABLET | Freq: Every day | ORAL | Status: DC
Start: 2017-05-29 — End: 2017-05-30
  Administered 2017-05-29 (×2): 40 mg via ORAL
  Filled 2017-05-28 (×2): qty 1

## 2017-05-28 MED ORDER — DIAZEPAM 5 MG PO TABS
5.0000 mg | ORAL_TABLET | Freq: Every morning | ORAL | Status: DC
  Administered 2017-05-29 – 2017-05-30 (×2): 5 mg via ORAL
  Filled 2017-05-28 (×2): qty 1

## 2017-05-28 MED ORDER — LINACLOTIDE 145 MCG PO CAPS
290.0000 ug | ORAL_CAPSULE | Freq: Every day | ORAL | Status: DC
  Filled 2017-05-28: qty 2

## 2017-05-28 MED ORDER — METHYLPREDNISOLONE SODIUM SUCC 125 MG IJ SOLR
80.0000 mg | Freq: Two times a day (BID) | INTRAMUSCULAR | Status: DC
  Administered 2017-05-28 – 2017-05-30 (×4): 80 mg via INTRAVENOUS
  Filled 2017-05-28 (×4): qty 2

## 2017-05-28 MED ORDER — DOCUSATE SODIUM 100 MG PO CAPS
100.0000 mg | ORAL_CAPSULE | Freq: Two times a day (BID) | ORAL | Status: DC
  Administered 2017-05-28 – 2017-05-30 (×4): 100 mg via ORAL
  Filled 2017-05-28 (×4): qty 1

## 2017-05-28 MED ORDER — ONDANSETRON HCL 4 MG PO TABS
4.0000 mg | ORAL_TABLET | Freq: Four times a day (QID) | ORAL | Status: DC | PRN
Start: 2017-05-28 — End: 2017-05-30

## 2017-05-28 MED ORDER — IPRATROPIUM-ALBUTEROL 0.5-2.5 (3) MG/3ML IN SOLN
3.0000 mL | Freq: Four times a day (QID) | RESPIRATORY_TRACT | Status: DC
  Filled 2017-05-28: qty 3

## 2017-05-28 MED ORDER — ALBUTEROL (5 MG/ML) CONTINUOUS INHALATION SOLN
10.0000 mg/h | INHALATION_SOLUTION | Freq: Once | RESPIRATORY_TRACT | Status: AC
  Administered 2017-05-28: 10 mg/h via RESPIRATORY_TRACT

## 2017-05-28 MED ORDER — IPRATROPIUM-ALBUTEROL 0.5-2.5 (3) MG/3ML IN SOLN
3.0000 mL | Freq: Once | RESPIRATORY_TRACT | Status: AC
  Administered 2017-05-28: 3 mL via RESPIRATORY_TRACT

## 2017-05-28 MED ORDER — SODIUM CHLORIDE 0.9 % IV SOLN: Freq: Once | INTRAVENOUS | Status: DC

## 2017-05-28 MED ORDER — ACETAMINOPHEN 650 MG RE SUPP: 650.0000 mg | Freq: Four times a day (QID) | RECTAL | Status: DC | PRN

## 2017-05-28 MED ORDER — TRAZODONE HCL 50 MG PO TABS
50.0000 mg | ORAL_TABLET | Freq: Once | ORAL | Status: AC
  Administered 2017-05-28: 50 mg via ORAL
  Filled 2017-05-28: qty 1

## 2017-05-28 NOTE — ED Provider Notes (Signed)
[facility] EMERGENCY DEPARTMENT Provider Note   CSN: [id number] Arrival date & time: 05/28/17  1042     History   Chief Complaint Chief Complaint  Patient presents with  . Cough    HPI Brenda Dunn is a 66 y.o. female.  HPI Brenda Dunn is a 66 y.o. female with hx of COPD, HTN, presents to ED with complaining of cough. States has been diagnosed with pneumonia 10 days ago. Was initially treated with zpack and steroids. Was started on doxycycline on  10/13.  Was not improving, started on Levaquin on 10/22, still has 1 left. States cough continues.  Was seen 2 days ago by her PCP last, will be referred to pulmonologist.  Has not gotten an appointment yet.  States she is here today because the cough is just getting worse, she is unable to sleep, she states now she is having pain everywhere from coughing.  Reports associated shortness of breath worse with exertion.  Total of 7 visits between PCP and ER For same complaint.   Past Medical History:  Diagnosis Date  . Anxiety   . Benzodiazepine dependence (HCC)   . Colonic polyp    mulitple polyps. repeat in 05/2019  . Constipation   . COPD (chronic obstructive pulmonary disease) (HCC)   . Depression   . GERD (gastroesophageal reflux disease)   . Hyperlipidemia   . Hypertension   . Insomnia   . Migraine headache   . OAB (overactive bladder)   . Osteoarthritis   . Post-surgical hypothyroidism   . Scoliosis     Patient Active Problem List   Diagnosis Date Noted  . Hypo-osmolality and hyponatremia 05/18/2017  . Smoker 05/02/2017  . Hypertension   . GERD (gastroesophageal reflux disease)   . Anxiety   . Post-surgical hypothyroidism   . Hyperlipidemia   . Osteoarthritis   . Depression   . Constipation   . Scoliosis   . Migraine headache   . Benzodiazepine dependence (HCC)   . COPD (chronic obstructive pulmonary disease) (HCC)   . Insomnia   . OAB (overactive bladder)   . Colonic polyp     Past Surgical  History:  Procedure Laterality Date  . HEMORRHOID SURGERY    . TH[facility]ROIDECTOMY     at [facility]  . TUBAL LIGATION  1979    OB History    No data available       Home Medications    Prior to Admission medications   Medication Sig Start Date End Date Taking? Authorizing Provider  albuterol (PROVENTIL HFA;VENTOLIN HFA) 108 (90 Base) MCG/ACT inhaler Inhale 2 puffs into the lungs every 4 (four) hours as needed for wheezing or shortness of breath. 05/13/17   Harris, Cammy Copa, PA-C  atorvastatin (LIPITOR) 40 MG tablet Take 40 mg by mouth daily.    [provider]  chlorpheniramine-HYDROcodone (TUSSIONEX PENNKINETIC ER) 10-8 MG/5ML SUER Take 5 mLs by mouth at bedtime as needed for cough. 05/23/17   Melene Plan, DO  diazepam (VALIUM) 5 MG tablet Take 5 mg by mouth. Take 1 tablet in the am, 1/2 tablet at lunch and 1 tablet at night    [provider]  Docusate Calcium (STOOL SOFTENER PO) Take 3 tablets by mouth daily.    [provider]  doxepin (SINEQUAN) 50 MG capsule Take 50 mg by mouth at bedtime.    [provider]  esomeprazole (NEXIUM) 40 MG capsule Take 1 capsule (40 mg total) by mouth daily. 05/02/17  Henson, [name], [name]  gabapentin (NEURONTIN) 300 MG capsule Take 300 mg by mouth 3 (three) times daily.    [provider]  HYDROcodone-homatropine (HYCODAN) 5-1.5 MG/5ML syrup Take 5 mLs by mouth every 6 (six) hours as needed for cough. 05/18/17   Henson, [name], [name]  levofloxacin (LEVAQUIN) 500 MG tablet Take 1 tablet (500 mg total) by mouth daily. 05/22/17   Henson, [name], [name]  levothyroxine (SYNTHROID, LEVOTHROID) 100 MCG tablet Take 100 mcg by mouth daily before breakfast.    [provider]  linaclotide (LINZESS) 290 MCG CAPS capsule Take 290 mcg by mouth daily before breakfast.    [provider]  lisinopril (PRINIVIL,ZESTRIL) 40 MG tablet Take 40 mg by mouth daily.    [provider]  mirabegron ER (MYRBETRIQ)  50 MG TB24 tablet Take 50 mg by mouth daily.    [provider]  OXcarbazepine (TRILEPTAL) 150 MG tablet Take 450 mg by mouth at bedtime.    [provider]  Tiotropium Bromide-Olodaterol (STIOLTO RESPIMAT) 2.5-2.5 MCG/ACT AERS Inhale 2 puffs into the lungs daily. 05/26/17   Avanell ShackletonHenson, [name], [name]    Family History No family history on file.  Social History Social History  Substance Use Topics  . Smoking status: Current Every Day Smoker    Packs/day: 1.50    Years: 44.00  . Smokeless tobacco: Never Used  . Alcohol use No     Allergies   Patient has no known allergies.   Review of Systems Review of Systems  Constitutional: Negative for chills and fever.  Respiratory: Positive for cough and shortness of breath. Negative for chest tightness.   Cardiovascular: Negative for chest pain, palpitations and leg swelling.  Gastrointestinal: Negative for abdominal pain, diarrhea, nausea and vomiting.  Genitourinary: Negative for dysuria, flank pain and pelvic pain.  Musculoskeletal: Negative for arthralgias, myalgias, neck pain and neck stiffness.  Skin: Negative for rash.  Neurological: Negative for dizziness, weakness and headaches.  All other systems reviewed and are negative.    Physical Exam Updated Vital Signs BP 118/65 (BP Location: Right Arm)   Pulse 74   Temp 99.4 F (37.4 C) (Oral)   Resp (!) 22   Ht 4\' 11"  (1.499 m)   Wt 60.3 kg (133 lb)   SpO2 95%   BMI 26.86 kg/m   Physical Exam  Constitutional: She is oriented to person, place, and time. She appears well-developed and well-nourished. No distress.  HENT:  Head: Normocephalic.  Eyes: Conjunctivae are normal.  Neck: Neck supple.  Cardiovascular: Normal rate, regular rhythm and normal heart sounds.   Pulmonary/Chest: She has wheezes. She has no rales.  Tachepneic.  Inspiratory and expiratory wheezes bilaterally.  Decreased air movement bilaterally.  Rhonchi present in all lung fields    Abdominal: Soft. Bowel sounds are normal. She exhibits no distension. There is no tenderness. There is no rebound.  Musculoskeletal: She exhibits no edema.  Neurological: She is alert and oriented to person, place, and time.  Skin: Skin is warm and dry.  Psychiatric: She has a normal mood and affect. Her behavior is normal.  Nursing note and vitals reviewed.    ED Treatments / Results  Labs (all labs ordered are listed, but only abnormal results are displayed) Labs Reviewed  CBC WITH DIFFERENTIAL/PLATELET - Abnormal; Notable for the following:       Result Value   RBC 3.31 (*)    Hemoglobin 10.4 (*)    HCT 30.5 (*)  Eosinophils Absolute 1.1 (*)    All other components within normal limits  BASIC METABOLIC PANEL - Abnormal; Notable for the following:    Sodium 122 (*)    Chloride 89 (*)    All other components within normal limits    EKG  EKG Interpretation None       Radiology Dg Chest 2 View  Result Date: 05/28/2017 CLINICAL DATA:  Pt with cough x 2-3 weeks; smoker; pt was dx about a week ago with pneumonia; COPD EXAM: CHEST  2 VIEW COMPARISON:  05/22/2017 FINDINGS: Lungs are mildly hyperinflated. There is mild perihilar peribronchial thickening. There has been improvement in aeration of the lingula and right lung base. No new infiltrates are identified. Heart size is normal. There is mild midthoracic spondylosis. IMPRESSION: 1. Bronchitic changes. 2. Interval resolution of infiltrates. Electronically Signed   By: Norva Pavlov M.D.   On: 05/28/2017 12:58    Procedures Procedures (including critical care time)  Medications Ordered in ED Medications - No data to display   Initial Impression / Assessment and Plan / ED Course  I have reviewed the triage vital signs and the nursing notes.  Pertinent labs & imaging results that were available during my care of the patient were reviewed by me and considered in my medical decision making (see chart for details).      Patient in emergency department with worsening cough and shortness of breath.  Has been seen 7 times prior to today for the same symptoms.  Has had 3 rounds of antibiotics.  One round of steroids.  Currently taking albuterol and Stiolto respimat.  Patient does appear to be short of breath, she is tachypneic, she has decreased air movement bilaterally with wheezing and rhonchi.  Will repeat chest x-ray to make sure of the resolution of pneumonia.  Will administer another breathing treatment.   Patient's chest x-ray improved.  She is not improving with one regular breathing treatment and 1 hour long treatment.  She is hypoxic with oxygen in the mid to upper 80s on room air.  Given failed courses of antibiotics and at home treatment, will admit for COPD exacerbation with hypoxia.  Patient is also found to be hyponatremic with sodium of 122.  Fluid boluses ordered.  Spoke with Dr. Melynda Ripple with [facility], who will admit.  Vitals:   05/28/17 1500 05/28/17 1527 05/28/17 1600 05/28/17 1618  BP: 110/78 132/85  (!) 143/73  Pulse: 91 94 95 98  Resp: 17 17 19 19   Temp:      TempSrc:      SpO2: 92% 96% 98% 97%  Weight:      Height:         Final Clinical Impressions(s) / ED Diagnoses   Final diagnoses:  COPD exacerbation [facility])    New Prescriptions New Prescriptions   No medications on file     Jaynie Crumble, PA-C 05/28/17 1635    Arby Barrette, MD 06/05/17 [phone]

## 2017-05-28 NOTE — H&P (Addendum)
History and Physical    Brenda Dunn XBJ:[id number] DOB: September 19, 1950 DOA: 05/28/2017  PCP: Avanell Shackleton, [name] Consultants:  Kathryne Eriksson Patient coming from:  Home - lives with husband; NOK: husband, [phone]  Chief Complaint:  cough  HPI: Brenda Dunn is a 66 y.o. female with medical history significant of hypothyroidism; HTN; HLD: overactive bladder; GERD; depression/anxiety; IBS with constipation; and COPD not on home O2 presenting with persistent cough and SOB despite multiple visits/treatments.  She established care with her new PCP on 10/2 and had labs that showed hyponatremia; she was seen in f/u on 10/10. At that visit, she reported cough for 3-4 days and she thought it was bronchitis; it was thought to be a viral URI and she was encouraged to take Mucinex.  She has h/o acute bronchitis in the past but none in 7-8 years.  She called the PCP back with ongoing symptoms on 10/12; the PCP called in a z-pack and "a cough medicine that didn't work" (promethazine DM).  She went to urgent care on 10/13, couldn't speak in more than 2-work sentences.  O2 sat was 89% and they sent her to the ER and they diagnosed her with lingular pneumonia.  She was given breathing treatments, Rocephin, Solumedrol, and magnesium; she was discharged with doxycycline and hydrocodone cough syrup as well as prn albuterol MDI.  The following day, 10/14, she was coughing a nonproductive cough so hard that she would gag.  She went back to the hospital that AM; they gave her another breathing treatment and prednisone and encouraged her to stop smoking.  She has had 2 additional clinic visits on 10/18 and 10/22 and was finally given Levaquin and Stialto.  She finally asked the ER doctor for admission on 10/23 because she wasn't getting any relief; he prescribed Tussionex cough syrup.  She was again seen in f/u with her PCP on 10/26 and referred to pulmonology.  No problems at all prior to 10/13.  She has had low  sodium for uncertain reason - no alcohol use and her husband also has it; this has been asymptomatic but her only other current medical concern. +SOB all the time.  +cough with post-tussive emesis, minimally productive.  No fever.  No abdominal pain other than from coughing.  She has overactive bladder and sleeps separately from her husband; she would wake up gasping for air and use the inhaler.  She was using her inhaler constantly yesterday.  She seems to be wheezing more now.  She smokes 1ppd, sometimes 1.5 ppd.  She can't smoke because of this, reports last cigarette was on 10/13 (although the recent notes report ongoing smoking and her room smells strongly of tobacco).  No night sweats other than with severe coughing spells.  She has coughed so much her throat is sore and feels swollen. Maybe for the last 3-4 days, her scales say she is gaining weight of a couple of pounds.  No LE edema.  She currently feels better, like she could walk around.  She thinks the oxygen is what is making her better.  They moved here in April.  She has "always had nerve problems" and they had to go to a psychiatrist to get the pills prescribed.  Poor sleep.   ED Course:  4 PCP visits plus 4 ER visits (including today), 3 rounds of antibiotics with ongoing symptoms.  CXR shows improvement with clearing of pneumonia.  Given regular neb and continuous neb with persistent hypoxia to  mid to upper 80s on room air.  Also with hyponatremia to 122.  Review of Systems: As per HPI; otherwise review of systems reviewed and negative.   Ambulatory Status:  Ambulates without assistance  Past Medical History:  Diagnosis Date  . Anxiety   . Benzodiazepine dependence (HCC)   . Colonic polyp    mulitple polyps. repeat in 05/2019  . Constipation    IBS  . COPD (chronic obstructive pulmonary disease) (HCC)   . Depression   . GERD (gastroesophageal reflux disease)   . Hyperlipidemia   . Hypertension   . Insomnia   . Migraine  headache   . OAB (overactive bladder)   . Osteoarthritis   . Post-surgical hypothyroidism   . Scoliosis     Past Surgical History:  Procedure Laterality Date  . HEMORRHOID SURGERY    . THYROIDECTOMY     at [facility]  . TUBAL LIGATION  1979    Social History   Social History  . Marital status: Married    Spouse name: N/A  . Number of children: N/A  . Years of education: N/A   Occupational History  . disabled due "my nerves"    Social History Main Topics  . Smoking status: Current Every Day Smoker    Packs/day: 1.50    Years: 44.00  . Smokeless tobacco: Never Used  . Alcohol use No  . Drug use: No  . Sexual activity: Not on file   Other Topics Concern  . Not on file   Social History Narrative  . No narrative on file    No Known Allergies  History reviewed. No pertinent family history.  Prior to Admission medications   Medication Sig Start Date End Date Taking? Authorizing Provider  albuterol (PROVENTIL HFA;VENTOLIN HFA) 108 (90 Base) MCG/ACT inhaler Inhale 2 puffs into the lungs every 4 (four) hours as needed for wheezing or shortness of breath. 05/13/17   Harris, Cammy Copa, PA-C  atorvastatin (LIPITOR) 40 MG tablet Take 40 mg by mouth daily.    [provider]  chlorpheniramine-HYDROcodone (TUSSIONEX PENNKINETIC ER) 10-8 MG/5ML SUER Take 5 mLs by mouth at bedtime as needed for cough. 05/23/17   Melene Plan, DO  diazepam (VALIUM) 5 MG tablet Take 5 mg by mouth. Take 1 tablet in the am, 1/2 tablet at lunch and 1 tablet at night    [provider]  Docusate Calcium (STOOL SOFTENER PO) Take 3 tablets by mouth daily.    [provider]  doxepin (SINEQUAN) 50 MG capsule Take 50 mg by mouth at bedtime.    [provider]  esomeprazole (NEXIUM) 40 MG capsule Take 1 capsule (40 mg total) by mouth daily. 05/02/17   Henson, [name], [name]  gabapentin (NEURONTIN) 300 MG capsule Take 300 mg by mouth 3 (three) times daily.    [provider]   HYDROcodone-homatropine (HYCODAN) 5-1.5 MG/5ML syrup Take 5 mLs by mouth every 6 (six) hours as needed for cough. 05/18/17   Henson, [name], [name]  levofloxacin (LEVAQUIN) 500 MG tablet Take 1 tablet (500 mg total) by mouth daily. 05/22/17   Henson, [name], [name]  levothyroxine (SYNTHROID, LEVOTHROID) 100 MCG tablet Take 100 mcg by mouth daily before breakfast.    [provider]  linaclotide (LINZESS) 290 MCG CAPS capsule Take 290 mcg by mouth daily before breakfast.    [provider]  lisinopril (PRINIVIL,ZESTRIL) 40 MG tablet Take 40 mg by mouth daily.    [provider]  mirabegron ER (  MYRBETRIQ) 50 MG TB24 tablet Take 50 mg by mouth daily.    [provider]  OXcarbazepine (TRILEPTAL) 150 MG tablet Take 450 mg by mouth at bedtime.    [provider]  Tiotropium Bromide-Olodaterol (STIOLTO RESPIMAT) 2.5-2.5 MCG/ACT AERS Inhale 2 puffs into the lungs daily. 05/26/17   Avanell Shackleton, [name]    Physical Exam: Vitals:   05/28/17 1527 05/28/17 1600 05/28/17 1618 05/28/17 1815  BP: 132/85  (!) 143/73 106/86  Pulse: 94 95 98 96  Resp: 17 19 19 16   Temp:    97.9 F (36.6 C)  TempSrc:    Oral  SpO2: 96% 98% 97% 93%  Weight:    64.3 kg (141 lb 12.1 oz)  Height:    4' 11.5" (1.511 m)     General:  Appears calm and comfortable and is NAD; very conversant; she did not cough despite very long conversation Eyes:  PERRL, EOMI, normal lids, iris ENT:  grossly normal hearing, lips & tongue, mmm Neck:  no LAD, masses or thyromegaly Cardiovascular:  RRR, no m/r/g. No LE edema.  Respiratory:   CTA bilaterally with scattered wheezes.  Normal respiratory effort. Abdomen:  soft, NT, ND, NABS Back:   normal alignment, no CVAT Skin:  no rash or induration seen on limited exam Musculoskeletal:  grossly normal tone BUE/BLE, good ROM, no bony abnormality Lower extremity:  No LE edema.   2+ distal pulses. Psychiatric:  grossly normal mood and affect,  speech fluent and appropriate, AOx3 Neurologic:  CN 2-12 grossly intact, moves all extremities in coordinated fashion, sensation intact    Radiological Exams on Admission: Dg Chest 2 View  Result Date: 05/28/2017 CLINICAL DATA:  Pt with cough x 2-3 weeks; smoker; pt was dx about a week ago with pneumonia; COPD EXAM: CHEST  2 VIEW COMPARISON:  05/22/2017 FINDINGS: Lungs are mildly hyperinflated. There is mild perihilar peribronchial thickening. There has been improvement in aeration of the lingula and right lung base. No new infiltrates are identified. Heart size is normal. There is mild midthoracic spondylosis. IMPRESSION: 1. Bronchitic changes. 2. Interval resolution of infiltrates. Electronically Signed   By: Norva Pavlov M.D.   On: 05/28/2017 12:58    EKG: Independently reviewed.  NSR with rate 75; Incomplete RBBB and LAFB; low voltage with no evidence of acute ischemia; NSCSLT   Labs on Admission: I have personally reviewed the available labs and imaging studies at the time of the admission.  Pertinent labs:   Na++ 122 - variable from 126 to 132 recently; 144 on 01/11/17 Chl 89 WBC 7.8 Hgb 10.4; prior 12.2 on 10/22 Differential on CBC with eosinophilia, new since 10/14   Assessment/Plan Principal Problem:   Acute on chronic respiratory failure (HCC) Active Problems:   Hypertension   Post-surgical hypothyroidism   Benzodiazepine dependence (HCC)   Smoker   Hypo-osmolality and hyponatremia   COPD exacerbation (HCC)   Acute on chronic respiratory failure -Patient with 8 total clinic and ER visits since 10/10 for persistent cough, SOB -Patient with marked smoking history and ongoing use and her husband also continues to smoke -She has been treated with 3 courses of antibiotics (Azithromycin, Doxycycline, and Levaquin) without improvement - which appears to be a fairly strong indication that this is not bacterial in nature -CXR previously showed PNA and now shows  improvement -This raises concern for alternate causes - to include underlying ILD; malignancy; viral or atypical infection including Legionella (see below); progressive COPD now advanced and requiring home  O2 and nebs (unusual to have developed so quickly, but perhaps the patient simply overlooked earlier symptoms). -She also had eosinophilia, raising concern for eosinophilic PNA or pneumonitis. -Will observe overnight -Chest CT -Duonebs standing, Albuterol q2h prn -Chloraseptic for throat pain -Solu-Medrol 80 mg IV BID  -PT/OT evaluations; nutrition, CM, SW, RT consults -Legionella testing - Legionella is a concern in the summer; with elderly patients, smokers, and those with COPD; and in the setting of hyponatremia.  It is effectively treated with Azithromycin, so the initial round of antibiotics would have been expected to treat this infection. -Respiratory virus panel ordered to include pertussis -Will order lower respiratory tract procalcitonin level.  Antibiotics would not be indicated for PCT <0.1 and probably should not be used for < 0.25.  >0.5 indicates infection and >>0.5 indicates more serious disease.  As the procalcitonin level normalizes, it will be reasonable to consider de-escalation of antibiotic coverage. -Based on CT results, consider pulmonary consult tomorrow  Hyponatremia -Etiology appears to be euvolemic hyponatremia -This likely evolved as a result of excessive oral fluid intake in conjunction with medications to include Trileptal (known to cause hyponatremia) and doxepin (can cause SIADH) -SIADH is also a consideration, particularly given her extensive tobacco history.  Will check CT C/A/P. -Will check urinary and serum Osm as well as urinary sodium and potassium levels -Because the patient is asymptomatic, it is likely chronic in nature -Will treat with fluid restriction of 1.5L day -This should be adjusted based on UOP with goal fluid intake of 500 cc less per day  than her total urinary output -Will recheck Na++ in the AM -If fluid restriction is unsuccessful, would need to consider vaptan therapy  HTN -Continue Lisinopril  Hypothyroidism -Recent low-normal TSH -Continue Synthroid at home dose at this time  BZD dependence -Continue Valium -Holding Trileptal (and doxepin) -Will give Ambien as a substitute  Tobacco dependence -Encourage cessation.  This was discussed with the patient and should be reviewed on an ongoing basis.     DVT prophylaxis:   Lovenox Code Status:  Full - confirmed with patient/family.  She would not want more than 1 month of artificial life sustaining measures. Family Communication: Husband present throughout evaluation  Disposition Plan:  Home once clinically improved Consults called: PT/OT/RT/Nutrition/CM/SW  Admission status: It is my clinical opinion that referral for OBSERVATION is reasonable and necessary in this patient based on the above information provided. The aforementioned taken together are felt to place the patient at high risk for further clinical deterioration. However it is anticipated that the patient may be medically stable for discharge from the hospital within 24 to 48 hours.    Jonah BlueJennifer Glendal Cassaday MD [facility]  If note is complete, please contact covering daytime or nighttime physician. [email or web address] Password [email or web address]  05/28/2017, 7:59 PM

## 2017-05-28 NOTE — ED Notes (Signed)
SpO2 conssitently running 88% on room air, placed on 1l/m [place] RN aware

## 2017-05-28 NOTE — ED Triage Notes (Addendum)
"   I have been to the DR 4 times in the 2 weeks   and I need more cough medicine " Cough, productive at times . Has 1 dose of levequin 500mg  left and scant amt of hydrocodone syrup

## 2017-05-28 NOTE — Plan of Care (Signed)
Problem: Safety: Goal: Ability to remain free from injury will improve Outcome: Completed/Met Date Met: 05/28/17 Bed alarm set. Discussed safety prevention plan, pt agreeable

## 2017-05-28 NOTE — Plan of Care (Signed)
[age]yo F with pmhx COPD, Migraine, HTN, bezo dependency. She's had 8 provider visits for this exacerbation. Orginally diagnosed with pna. Has done once round of steroids. Has been on a course of azithomycin, levaquin and doxyclcline. CXR today shows prior CXR has resolved. Needing 2L of O2.   Pt has low NA of unknown origin. IVF started in ED.  Haydee SalterPhillip M Hobbs

## 2017-05-29 ENCOUNTER — Ambulatory Visit: Payer: Medicare Other | Admitting: Family Medicine

## 2017-05-29 ENCOUNTER — Observation Stay (HOSPITAL_COMMUNITY): Payer: Medicare Other

## 2017-05-29 ENCOUNTER — Encounter (HOSPITAL_COMMUNITY): Payer: Self-pay | Admitting: Radiology

## 2017-05-29 DIAGNOSIS — J441 Chronic obstructive pulmonary disease with (acute) exacerbation: Secondary | ICD-10-CM | POA: Diagnosis not present

## 2017-05-29 DIAGNOSIS — F17200 Nicotine dependence, unspecified, uncomplicated: Secondary | ICD-10-CM

## 2017-05-29 DIAGNOSIS — F1320 Sedative, hypnotic or anxiolytic dependence, uncomplicated: Secondary | ICD-10-CM

## 2017-05-29 DIAGNOSIS — I10 Essential (primary) hypertension: Secondary | ICD-10-CM

## 2017-05-29 DIAGNOSIS — E871 Hypo-osmolality and hyponatremia: Secondary | ICD-10-CM

## 2017-05-29 DIAGNOSIS — J9621 Acute and chronic respiratory failure with hypoxia: Secondary | ICD-10-CM | POA: Diagnosis not present

## 2017-05-29 LAB — RESPIRATORY PANEL BY PCR
Adenovirus: NOT DETECTED
BORDETELLA PERTUSSIS-RVPCR: NOT DETECTED
CORONAVIRUS 229E-RVPPCR: NOT DETECTED
CORONAVIRUS HKU1-RVPPCR: NOT DETECTED
CORONAVIRUS OC43-RVPPCR: NOT DETECTED
Chlamydophila pneumoniae: NOT DETECTED
Coronavirus NL63: NOT DETECTED
INFLUENZA B-RVPPCR: NOT DETECTED
Influenza A: NOT DETECTED
MYCOPLASMA PNEUMONIAE-RVPPCR: NOT DETECTED
Metapneumovirus: NOT DETECTED
PARAINFLUENZA VIRUS 1-RVPPCR: NOT DETECTED
Parainfluenza Virus 2: NOT DETECTED
Parainfluenza Virus 3: NOT DETECTED
Parainfluenza Virus 4: NOT DETECTED
RESPIRATORY SYNCYTIAL VIRUS-RVPPCR: NOT DETECTED
Rhinovirus / Enterovirus: NOT DETECTED

## 2017-05-29 LAB — CBC
HCT: 33.5 % — ABNORMAL LOW (ref 36.0–46.0)
Hemoglobin: 11.5 g/dL — ABNORMAL LOW (ref 12.0–15.0)
MCH: 31.3 pg (ref 26.0–34.0)
MCHC: 34.3 g/dL (ref 30.0–36.0)
MCV: 91 fL (ref 78.0–100.0)
PLATELETS: 191 10*3/uL (ref 150–400)
RBC: 3.68 MIL/uL — AB (ref 3.87–5.11)
RDW: 12.6 % (ref 11.5–15.5)
WBC: 12.6 10*3/uL — AB (ref 4.0–10.5)

## 2017-05-29 LAB — BASIC METABOLIC PANEL
Anion gap: 12 (ref 5–15)
BUN: 11 mg/dL (ref 6–20)
CALCIUM: 9.6 mg/dL (ref 8.9–10.3)
CO2: 20 mmol/L — ABNORMAL LOW (ref 22–32)
CREATININE: 0.78 mg/dL (ref 0.44–1.00)
Chloride: 96 mmol/L — ABNORMAL LOW (ref 101–111)
GFR calc Af Amer: >60 mL/min (ref >60)
Glucose, Bld: 141 mg/dL — ABNORMAL HIGH (ref 65–99)
Potassium: 4 mmol/L (ref 3.5–5.1)
SODIUM: 128 mmol/L — AB (ref 135–145)

## 2017-05-29 LAB — HIV ANTIBODY (ROUTINE TESTING W REFLEX): HIV SCREEN 4TH GENERATION: NONREACTIVE

## 2017-05-29 LAB — OSMOLALITY, URINE: Osmolality, Ur: 271 mOsm/kg — ABNORMAL LOW (ref 300–900)

## 2017-05-29 LAB — NA AND K (SODIUM & POTASSIUM), RAND UR
POTASSIUM UR: 26 mmol/L
SODIUM UR: 65 mmol/L

## 2017-05-29 LAB — OSMOLALITY: OSMOLALITY: 267 mosm/kg — AB (ref 275–295)

## 2017-05-29 MED ORDER — GI COCKTAIL ~~LOC~~
30.0000 mL | Freq: Three times a day (TID) | ORAL | Status: DC | PRN
  Administered 2017-05-29: 30 mL via ORAL
  Filled 2017-05-29: qty 30

## 2017-05-29 MED ORDER — IOPAMIDOL (ISOVUE-300) INJECTION 61%
INTRAVENOUS | Status: AC
  Administered 2017-05-29: 30 mL via ORAL
  Filled 2017-05-29: qty 30

## 2017-05-29 MED ORDER — IOPAMIDOL (ISOVUE-300) INJECTION 61%: 15.0000 mL | Freq: Once | INTRAVENOUS | Status: AC | PRN

## 2017-05-29 MED ORDER — IOPAMIDOL (ISOVUE-300) INJECTION 61%
INTRAVENOUS | Status: AC
  Filled 2017-05-29: qty 100

## 2017-05-29 MED ORDER — IOPAMIDOL (ISOVUE-300) INJECTION 61%
100.0000 mL | Freq: Once | INTRAVENOUS | Status: AC | PRN
  Administered 2017-05-29: 100 mL via INTRAVENOUS

## 2017-05-29 MED ORDER — OXCARBAZEPINE 150 MG PO TABS
450.0000 mg | ORAL_TABLET | Freq: Every day | ORAL | Status: DC
  Administered 2017-05-29: 450 mg via ORAL
  Filled 2017-05-29: qty 1

## 2017-05-29 MED ORDER — NICOTINE 21 MG/24HR TD PT24
21.0000 mg | MEDICATED_PATCH | Freq: Every day | TRANSDERMAL | Status: DC
  Filled 2017-05-29 (×2): qty 1

## 2017-05-29 MED ORDER — IPRATROPIUM-ALBUTEROL 0.5-2.5 (3) MG/3ML IN SOLN
3.0000 mL | Freq: Three times a day (TID) | RESPIRATORY_TRACT | Status: DC
  Administered 2017-05-30: 3 mL via RESPIRATORY_TRACT
  Filled 2017-05-29 (×2): qty 3

## 2017-05-29 NOTE — Evaluation (Signed)
Physical Therapy Evaluation Patient Details Name: Brenda Dunn MRN: [id number] DOB: Mar 09, 1951 Today's Date: 05/29/2017   History of Present Illness  Brenda Dunn is a 66 y.o. female with medical history significant of hypothyroidism; HTN; HLD: overactive bladder; GERD; depression/anxiety; IBS with constipation; and COPD not on home O2 presenting with persistent cough and SOB despite multiple visits/treatments.  Clinical Impression  The patient is mobilizing without assistance. Oxygen saturation > 90% on RA for ambulation. Has multiple steps to enter home s  Will practice and check oxygen next visit. Pt admitted with above diagnosis. Pt currently with functional limitations due to the deficits listed below (see PT Problem List).  Pt will benefit from skilled PT to increase their independence and safety with mobility to allow discharge to the venue listed below.       Follow Up Recommendations No PT follow up (ppulmonary rehab out patient program)    Equipment Recommendations  None recommended by PT    Recommendations for Other Services       Precautions / Restrictions Precautions Precautions: None      Mobility  Bed Mobility Overal bed mobility: Independent                Transfers Overall transfer level: Independent                  Ambulation/Gait Ambulation/Gait assistance: Independent Ambulation Distance (Feet): 200 Feet Assistive device: None Gait Pattern/deviations: WFL(Within Functional Limits)   Gait velocity interpretation: at or above normal speed for age/gender General Gait Details: no assistance required, did hold onto the  Dynamap , no coughing while ambulatring. oxygen saturation >90%  Stairs            Wheelchair Mobility    Modified Rankin (Stroke Patients Only)       Balance Overall balance assessment: Independent                                           Pertinent Vitals/Pain Pain Assessment: No/denies  pain    Home Living Family/patient expects to be discharged to:: Private residence Living Arrangements: Spouse/significant other Available Help at Discharge: Family Type of Home: House Home Access: Stairs to enter Entrance Stairs-Rails: Right Entrance Stairs-Number of Steps: 7 x 2 Home Layout: One level Home Equipment: None      Prior Function Level of Independence: Independent               Hand Dominance        Extremity/Trunk Assessment        Lower Extremity Assessment Lower Extremity Assessment: Overall WFL for tasks assessed    Cervical / Trunk Assessment Cervical / Trunk Assessment: Normal  Communication   Communication: No difficulties  Cognition Arousal/Alertness: Awake/alert Behavior During Therapy: WFL for tasks assessed/performed Overall Cognitive Status: Within Functional Limits for tasks assessed                                        General Comments      Exercises     Assessment/Plan    PT Assessment Patient needs continued PT services  PT Problem List Cardiopulmonary status limiting activity       PT Treatment Interventions      PT Goals (Current goals can be found in the  Care Plan section)  Acute Rehab PT Goals Patient Stated Goal: to get back wlaking PT Goal Formulation: With patient Time For Goal Achievement: 05/31/17 Potential to Achieve Goals: Good    Frequency Min 2X/week   Barriers to discharge        Co-evaluation               AM-PAC PT "6 Clicks" Daily Activity  Outcome Measure Difficulty turning over in bed (including adjusting bedclothes, sheets and blankets)?: None Difficulty moving from lying on back to sitting on the side of the bed? : None Difficulty sitting down on and standing up from a chair with arms (e.g., wheelchair, bedside commode, etc,.)?: None Help needed moving to and from a bed to chair (including a wheelchair)?: None Help needed walking in hospital room?: None Help  needed climbing 3-5 steps with a railing? : A Little 6 Click Score: 23    End of Session   Activity Tolerance: Patient tolerated treatment well Patient left: in bed Nurse Communication: Mobility status PT Visit Diagnosis: Difficulty in walking, not elsewhere classified (R26.2)    Time: 1430-1500 PT Time Calculation (min) (ACUTE ONLY): 30 min   Charges:   PT Evaluation $PT Eval Low Complexity: 1 Low PT Treatments $Gait Training: 8-22 mins   PT G Codes:   PT G-Codes **NOT FOR INPATIENT CLASS** Functional Assessment Tool Used: AM-PAC 6 Clicks Basic Mobility;Clinical judgement Functional Limitation: Mobility: Walking and moving around Mobility: Walking and Moving Around Current Status ([id number](G8978): At least 1 percent but less than 20 percent impaired, limited or restricted Mobility: Walking and Moving Around Goal Status [phone](G8979): 0 percent impaired, limited or restricted    Blanchard KelchKaren Olive Zmuda PT [phone] }  Rada HayHill, Kamilla Hands Elizabeth 05/29/2017, 3:21 PM

## 2017-05-29 NOTE — Progress Notes (Signed)
CSW received consult for "COPD protocol". Patient has not had 3 admissions within the past 6 months and does not meet COPD protocol requirements.   Stacy GardnerErin Jackilyn Umphlett, [facility]SWA Emergency Room Clinical Social Worker [phone]

## 2017-05-29 NOTE — Progress Notes (Signed)
Nutrition Brief Note  RD consulted via COPD gold protocol.  Pt with weight gain. Pt noted weight gain on her scales at home.  Wt Readings from Last 15 Encounters:  05/28/17 141 lb 12.1 oz (64.3 kg)  05/23/17 133 lb (60.3 kg)  05/14/17 133 lb (60.3 kg)  05/13/17 133 lb (60.3 kg)  05/10/17 133 lb (60.3 kg)  05/02/17 131 lb (59.4 kg)    Body mass index is 28.15 kg/m. Patient meets criteria for overweight based on current BMI.   Current diet order is Regular. Labs and medications reviewed.   No nutrition interventions warranted at this time. If nutrition issues arise, please consult RD.   Tilda FrancoLindsey Hilary Milks, MS, RD, LDN [facility] Clinical Dietitian Pager: [phone] After Hours Pager: [phone]

## 2017-05-29 NOTE — Care Management Note (Signed)
Case Management Note  Patient Details  Name: Brenda Dunn MRN: [name] Date of Birth: 08/18/1950  Subjective/Objective:66 y/o f admitted w/COPD. From home. Hx: smokes, depression,anxiety. PT cons-await recc.                   Action/Plan:d/c plan home.   Expected Discharge Date:                  Expected Discharge Plan:  Home/Self Care  In-House Referral:     Discharge planning Services  CM Consult  Post Acute Care Choice:    Choice offered to:     DME Arranged:    DME Agency:     HH Arranged:    HH Agency:     Status of Service:  In process, will continue to follow  If discussed at Long Length of Stay Meetings, dates discussed:    Additional Comments:  Lanier ClamMahabir, Otniel Hoe, RN 05/29/2017, 12:10 PM

## 2017-05-29 NOTE — Progress Notes (Signed)
Pt's home medications sent home with husband. Pt is anxious and frustrated that not all her home medications are ordered. Pt informed that these medications are being held per MD order. On call provider made aware. Will continue to monitor.

## 2017-05-29 NOTE — Progress Notes (Signed)
OT Cancellation Note  Patient Details Name: Brenda PonderSonja Dunn No MRN: [id number] DOB: 06/01/1951    Cancelled Treatment:    Pt was working with PT, now back in bed. Will check on pt in the morning Lise AuerLori Albertus Chiarelli, [place] [phone]  Einar CrowEDDING, [name] 05/29/2017, 3:33 PM

## 2017-05-29 NOTE — Care Management Obs Status (Signed)
MEDICARE OBSERVATION STATUS NOTIFICATION   Patient Details  Name: Elisha PonderSonja E Stipes MRN: [id number] Date of Birth: 09/04/1950   Medicare Observation Status Notification Given:  Yes    MahabirOlegario Messier, Eliya Geiman, RN 05/29/2017, 3:17 PM

## 2017-05-29 NOTE — Progress Notes (Signed)
PT Cancellation Note  Patient Details Name: Brenda Dunn MRN: [id number] DOB: 11/27/1950   Cancelled Treatment:    Reason Eval/Treat Not Completed: Patient at procedure or test/unavailable   Sharen HeckHill, Lamaj Metoyer Elizabeth Hessie Varone PT [phone]  05/29/2017, 11:38 AM

## 2017-05-29 NOTE — Progress Notes (Signed)
PROGRESS NOTE    Brenda Dunn  ZOX:[id number] DOB: January 25, 1951 DOA: 05/28/2017 PCP: Avanell Shackleton, [name]    Brief Narrative: Brenda Dunn is a 66 y.o. female with medical history significant of hypothyroidism; HTN; HLD: overactive bladder; GERD; depression/anxiety; IBS with constipation; and COPD not on home O2 presenting with persistent cough and SOB despite multiple visits/treatments.  Assessment & Plan:   Principal Problem:   Acute on chronic respiratory failure (HCC) Active Problems:   Hypertension   Post-surgical hypothyroidism   Benzodiazepine dependence (HCC)   Smoker   Hypo-osmolality and hyponatremia   COPD exacerbation (HCC)   Acute respiratory failure:  Unclear etiology. Strongly suspect from copd exacerbation, as she reports active smoking.  CXR shows improving pneumonia.  Plan for CT chest for further evaluation.  Resume solumedrol, prn duonebs and [place] OXYGEN as needed.    Hyponatremia: probably SIADH vs from meds.  Improving.  Continue to monitor.    Hypertension;  Well controlled.    Hyperthyroidism:  Resume synthroid.   Tobacco dependence  Resume nicotine patch.    BZD dependence:  Resume al lmeds except for doxepin sec to hyponatremia   Mild normocytic anemia:  Continue to monitor hemoglobin.   Constipation:  Stool softeners added.   DVT prophylaxis: lovenox.  Code Status: full code.  Family Communication: none at bedside.  Disposition Plan: pending eval by PT.    Consultants:  None.   Procedures: none.   Antimicrobials: None.  Subjective: Unable to sleep, sob .   Objective: Vitals:   05/28/17 2045 05/29/17 0143 05/29/17 0429 05/29/17 0751  BP:   (!) 145/63   Pulse:   95   Resp:   18   Temp:   97.9 F (36.6 C)   TempSrc:   Oral   SpO2: 94% 97% 95% 94%  Weight:      Height:        Intake/Output Summary (Last 24 hours) at 05/29/17 1447 Last data filed at 05/29/17 1237  Gross per 24 hour  Intake             1290 ml    Output             2300 ml  Net            -1010 ml   Filed Weights   05/28/17 1051 05/28/17 1815  Weight: 60.3 kg (133 lb) 64.3 kg (141 lb 12.1 oz)    Examination:  General exam: Appears calm and comfortable  Respiratory system: Clear to auscultation. Respiratory effort normal. Cardiovascular system: S1 & S2 heard, RRR. No JVD, murmurs, rubs, gallops or clicks. No pedal edema. Gastrointestinal system: Abdomen is nondistended, soft and nontender. No organomegaly or masses felt. Normal bowel sounds heard. Central nervous system: Alert and oriented. No focal neurological deficits. Extremities: Symmetric 5 x 5 power. Skin: No rashes, lesions or ulcers Psychiatry: Judgement and insight appear normal. Mood & affect appropriate.     Data Reviewed: I have personally reviewed following labs and imaging studies  CBC:  Recent Labs Lab 05/28/17 1138 05/29/17 0527  WBC 7.8 12.6*  NEUTROABS 4.9  --   HGB 10.4* 11.5*  HCT 30.5* 33.5*  MCV 92.1 91.0  PLT 164 191   Basic Metabolic Panel:  Recent Labs Lab 05/28/17 1138 05/29/17 0527  NA 122* 128*  K 3.9 4.0  CL 89* 96*  CO2 25 20*  GLUCOSE 90 141*  BUN 9 11  CREATININE 0.72 0.78  CALCIUM 9.2 9.6  GFR: Estimated Creatinine Clearance: 58 mL/min (by C-G formula based on SCr of 0.78 mg/dL). Liver Function Tests: No results for input(s): AST, ALT, ALKPHOS, BILITOT, PROT, ALBUMIN in the last 168 hours. No results for input(s): LIPASE, AMYLASE in the last 168 hours. No results for input(s): AMMONIA in the last 168 hours. Coagulation Profile: No results for input(s): INR, PROTIME in the last 168 hours. Cardiac Enzymes: No results for input(s): CKTOTAL, CKMB, CKMBINDEX, TROPONINI in the last 168 hours. BNP (last 3 results) No results for input(s): PROBNP in the last 8760 hours. HbA1C: No results for input(s): HGBA1C in the last 72 hours. CBG: No results for input(s): GLUCAP in the last 168 hours. Lipid Profile: No results  for input(s): CHOL, HDL, LDLCALC, TRIG, CHOLHDL, LDLDIRECT in the last 72 hours. Thyroid Function Tests: No results for input(s): TSH, T4TOTAL, FREET4, T3FREE, THYROIDAB in the last 72 hours. Anemia Panel: No results for input(s): VITAMINB12, FOLATE, FERRITIN, TIBC, IRON, RETICCTPCT in the last 72 hours. Sepsis Labs:  Recent Labs Lab 05/28/17 2153  PROCALCITON <0.10    Recent Results (from the past 240 hour(s))  Respiratory Panel by PCR     Status: None   Collection Time: 05/28/17  9:25 PM  Result Value Ref Range Status   Adenovirus NOT DETECTED NOT DETECTED Final   Coronavirus 229E NOT DETECTED NOT DETECTED Final   Coronavirus HKU1 NOT DETECTED NOT DETECTED Final   Coronavirus NL63 NOT DETECTED NOT DETECTED Final   Coronavirus OC43 NOT DETECTED NOT DETECTED Final   Metapneumovirus NOT DETECTED NOT DETECTED Final   Rhinovirus / Enterovirus NOT DETECTED NOT DETECTED Final   Influenza A NOT DETECTED NOT DETECTED Final   Influenza B NOT DETECTED NOT DETECTED Final   Parainfluenza Virus 1 NOT DETECTED NOT DETECTED Final   Parainfluenza Virus 2 NOT DETECTED NOT DETECTED Final   Parainfluenza Virus 3 NOT DETECTED NOT DETECTED Final   Parainfluenza Virus 4 NOT DETECTED NOT DETECTED Final   Respiratory Syncytial Virus NOT DETECTED NOT DETECTED Final   Bordetella pertussis NOT DETECTED NOT DETECTED Final   Chlamydophila pneumoniae NOT DETECTED NOT DETECTED Final   Mycoplasma pneumoniae NOT DETECTED NOT DETECTED Final    Comment: Performed at [facility], [address] [place], [place] [address]         Radiology Studies: Dg Chest 2 View  Result Date: 05/28/2017 CLINICAL DATA:  Pt with cough x 2-3 weeks; smoker; pt was dx about a week ago with pneumonia; COPD EXAM: CHEST  2 VIEW COMPARISON:  05/22/2017 FINDINGS: Lungs are mildly hyperinflated. There is mild perihilar peribronchial thickening. There has been improvement in aeration of the lingula and right lung base. No new  infiltrates are identified. Heart size is normal. There is mild midthoracic spondylosis. IMPRESSION: 1. Bronchitic changes. 2. Interval resolution of infiltrates. Electronically Signed   By: Norva PavlovElizabeth  Brown M.D.   On: 05/28/2017 12:58   Ct Chest W Contrast  Result Date: 05/29/2017 CLINICAL DATA:  Cough for the past 2 weeks.  Hyponatremia. EXAM: CT CHEST, ABDOMEN, AND PELVIS WITH CONTRAST TECHNIQUE: Multidetector CT imaging of the chest, abdomen and pelvis was performed following the standard protocol during bolus administration of intravenous contrast. CONTRAST:  100mL ISOVUE-300 IOPAMIDOL (ISOVUE-300) INJECTION 61%, 30mL ISOVUE-300 IOPAMIDOL (ISOVUE-300) INJECTION 61% COMPARISON:  Chest x-ray from yesterday. FINDINGS: CT CHEST FINDINGS Cardiovascular: No significant vascular findings. Normal heart size. No pericardial effusion. Normal caliber thoracic aorta. Aortic atherosclerotic vascular calcifications. Mediastinum/Nodes: No enlarged mediastinal, hilar, or axillary lymph  nodes. Thyroid gland, trachea, and esophagus demonstrate no significant findings. Lungs/Pleura: Mild upper lobe centrilobular emphysema. Diffuse peribronchial thickening. Scattered areas of scarring. Bibasilar subsegmental atelectasis. 5 mm solid nodule in the lingula (series 4, image 85). No consolidation, pleural effusion, or pneumothorax. Musculoskeletal: No acute or significant osseous findings. Degenerative changes of the thoracic spine. CT ABDOMEN PELVIS FINDINGS Hepatobiliary: No focal liver abnormality is seen. No gallstones, gallbladder wall thickening, or biliary dilatation. Pancreas: Unremarkable. No pancreatic ductal dilatation or surrounding inflammatory changes. Spleen: Normal in size without focal abnormality. Adrenals/Urinary Tract: Adrenal glands are unremarkable. Kidneys are normal, without renal calculi, focal lesion, or hydronephrosis. Bladder is prominently distended. Stomach/Bowel: Small hiatal hernia. The stomach is  otherwise within normal limits. There are a few loops of mildly distended small bowel in the left abdomen. There is no evidence of obstruction. Oral contrast reaches the cecum. Moderate colonic stool burden. Normal appendix. Vascular/Lymphatic: Aortic atherosclerosis. No enlarged abdominal or pelvic lymph nodes. Reproductive: Uterus and bilateral adnexa are unremarkable. Other: No free fluid or pneumoperitoneum. Musculoskeletal: No acute or significant osseous findings. Dextroscoliosis and degenerative changes of the lower spine. Grade 1 anterolisthesis of L4 on L5. Small amount of stranding and a few foci of subcutaneous air along the right anterolateral lower abdominal wall. Correlate for recent injection. IMPRESSION: 1.  No acute intrathoracic process. 2. Diffuse peribronchial thickening and mild upper lobe centrilobular emphysema, consistent with COPD. Emphysema (ICD10-J43.9). 3. 5 mm nodule in the lingula. No follow-up needed if patient is low-risk. Non-contrast chest CT can be considered in 12 months if patient is high-risk. This recommendation follows the consensus statement: Guidelines for Management of Incidental Pulmonary Nodules Detected on CT Images: From the Fleischner Society 2017; Radiology 2017; 284:228-243. 4.  No acute intra-abdominal process. 5. There are a few loops of mildly distended small bowel in the left abdomen, which may reflect dysmotility. No evidence of obstruction. 6.  Prominent stool throughout the colon favors constipation. 7.  Aortic atherosclerosis (ICD10-I70.0). Electronically Signed   By: Obie Dredge M.D.   On: 05/29/2017 12:26   Ct Abdomen Pelvis W Contrast  Result Date: 05/29/2017 CLINICAL DATA:  Cough for the past 2 weeks.  Hyponatremia. EXAM: CT CHEST, ABDOMEN, AND PELVIS WITH CONTRAST TECHNIQUE: Multidetector CT imaging of the chest, abdomen and pelvis was performed following the standard protocol during bolus administration of intravenous contrast. CONTRAST:   ISOVUE-300 IOPAMIDOL (ISOVUE-300) INJECTION 61%, 30mL ISOVUE-300 IOPAMIDOL (ISOVUE-300) INJECTION 61% COMPARISON:  Chest x-ray from yesterday. FINDINGS: CT CHEST FINDINGS Cardiovascular: No significant vascular findings. Normal heart size. No pericardial effusion. Normal caliber thoracic aorta. Aortic atherosclerotic vascular calcifications. Mediastinum/Nodes: No enlarged mediastinal, hilar, or axillary lymph nodes. Thyroid gland, trachea, and esophagus demonstrate no significant findings. Lungs/Pleura: Mild upper lobe centrilobular emphysema. Diffuse peribronchial thickening. Scattered areas of scarring. Bibasilar subsegmental atelectasis. 5 mm solid nodule in the lingula (series 4, image 85). No consolidation, pleural effusion, or pneumothorax. Musculoskeletal: No acute or significant osseous findings. Degenerative changes of the thoracic spine. CT ABDOMEN PELVIS FINDINGS Hepatobiliary: No focal liver abnormality is seen. No gallstones, gallbladder wall thickening, or biliary dilatation. Pancreas: Unremarkable. No pancreatic ductal dilatation or surrounding inflammatory changes. Spleen: Normal in size without focal abnormality. Adrenals/Urinary Tract: Adrenal glands are unremarkable. Kidneys are normal, without renal calculi, focal lesion, or hydronephrosis. Bladder is prominently distended. Stomach/Bowel: Small hiatal hernia. The stomach is otherwise within normal limits. There are a few loops of mildly distended small bowel in the left abdomen. There is no evidence of obstruction.  Oral contrast reaches the cecum. Moderate colonic stool burden. Normal appendix. Vascular/Lymphatic: Aortic atherosclerosis. No enlarged abdominal or pelvic lymph nodes. Reproductive: Uterus and bilateral adnexa are unremarkable. Other: No free fluid or pneumoperitoneum. Musculoskeletal: No acute or significant osseous findings. Dextroscoliosis and degenerative changes of the lower spine. Grade 1 anterolisthesis of L4 on L5. Small  amount of stranding and a few foci of subcutaneous air along the right anterolateral lower abdominal wall. Correlate for recent injection. IMPRESSION: 1.  No acute intrathoracic process. 2. Diffuse peribronchial thickening and mild upper lobe centrilobular emphysema, consistent with COPD. Emphysema (ICD10-J43.9). 3. 5 mm nodule in the lingula. No follow-up needed if patient is low-risk. Non-contrast chest CT can be considered in 12 months if patient is high-risk. This recommendation follows the consensus statement: Guidelines for Management of Incidental Pulmonary Nodules Detected on CT Images: From the Fleischner Society 2017; Radiology 2017; 284:228-243. 4.  No acute intra-abdominal process. 5. There are a few loops of mildly distended small bowel in the left abdomen, which may reflect dysmotility. No evidence of obstruction. 6.  Prominent stool throughout the colon favors constipation. 7.  Aortic atherosclerosis (ICD10-I70.0). Electronically Signed   By: Obie Dredge M.D.   On: 05/29/2017 12:26        Scheduled Meds: . atorvastatin  40 mg Oral Daily  . diazepam  2.5 mg Oral Q lunch  . diazepam  5 mg Oral q morning - 10a  . diazepam  5 mg Oral QHS  . docusate sodium  100 mg Oral BID  . enoxaparin (LOVENOX) injection  40 mg Subcutaneous Q24H  . gabapentin  300 mg Oral TID  . iopamidol      . ipratropium-albuterol  3 mL Nebulization Q6H  . levothyroxine  100 mcg Oral QAC breakfast  . linaclotide  290 mcg Oral QHS  . lisinopril  40 mg Oral Daily  . methylPREDNISolone (SOLU-MEDROL) injection  80 mg Intravenous Q12H  . mirabegron ER  50 mg Oral Daily  . nicotine  21 mg Transdermal Daily  . OXcarbazepine  450 mg Oral QHS  . pantoprazole  40 mg Oral Daily   Continuous Infusions:   LOS: 0 days    Time spent: 35 minutes.     Kathlen Mody, MD [facility] Pager [phone]  If 7PM-7AM, please contact night-coverage [email or web address] Password [email or web address] 05/29/2017, 2:47 PM

## 2017-05-30 DIAGNOSIS — J9621 Acute and chronic respiratory failure with hypoxia: Secondary | ICD-10-CM | POA: Diagnosis not present

## 2017-05-30 DIAGNOSIS — I10 Essential (primary) hypertension: Secondary | ICD-10-CM | POA: Diagnosis not present

## 2017-05-30 DIAGNOSIS — J441 Chronic obstructive pulmonary disease with (acute) exacerbation: Secondary | ICD-10-CM | POA: Diagnosis not present

## 2017-05-30 LAB — BASIC METABOLIC PANEL
ANION GAP: 11 (ref 5–15)
BUN: 17 mg/dL (ref 6–20)
CALCIUM: 9.7 mg/dL (ref 8.9–10.3)
CO2: 25 mmol/L (ref 22–32)
CREATININE: 0.85 mg/dL (ref 0.44–1.00)
Chloride: 98 mmol/L — ABNORMAL LOW (ref 101–111)
GFR calc Af Amer: >60 mL/min (ref >60)
GFR calc non Af Amer: >60 mL/min (ref >60)
Glucose, Bld: 105 mg/dL — ABNORMAL HIGH (ref 65–99)
Potassium: 4.3 mmol/L (ref 3.5–5.1)
Sodium: 134 mmol/L — ABNORMAL LOW (ref 135–145)

## 2017-05-30 LAB — LEGIONELLA PNEUMOPHILA SEROGP 1 UR AG: L. PNEUMOPHILA SEROGP 1 UR AG: NEGATIVE

## 2017-05-30 MED ORDER — IPRATROPIUM-ALBUTEROL 0.5-2.5 (3) MG/3ML IN SOLN: 3.0000 mL | Freq: Three times a day (TID) | RESPIRATORY_TRACT | 1 refills | Status: DC

## 2017-05-30 MED ORDER — NICOTINE 21 MG/24HR TD PT24: 21.0000 mg | MEDICATED_PATCH | Freq: Every day | TRANSDERMAL | 0 refills | Status: DC

## 2017-05-30 MED ORDER — GUAIFENESIN-DM 100-10 MG/5ML PO SYRP: 5.0000 mL | ORAL_SOLUTION | ORAL | 0 refills | Status: DC | PRN

## 2017-05-30 MED ORDER — PREDNISONE 10 MG (21) PO TBPK: ORAL_TABLET | ORAL | 0 refills | Status: DC

## 2017-05-30 NOTE — Progress Notes (Signed)
Physical Therapy Treatment Patient Details Name: Brenda Dunn MRN: [id number] DOB: [birth year] Today's Date: 05/30/2017    History of Present Illness Brenda Dunn is a 66 y.o. female with medical history significant of hypothyroidism; HTN; HLD: overactive bladder; GERD; depression/anxiety; IBS with constipation; and COPD not on home O2 presenting with persistent cough and SOB despite multiple visits/treatments.    PT Comments    Pt is independent with mobility, she ambulated 350' without an assistive device with no loss of balance, SaO2 91-95% on room air walking. She is ready to DC home from PT standpoint. PT signing off, encouraged pt to ambulate in halls independently TID.   Follow Up Recommendations  No PT follow up (ppulmonary rehab out patient program)     Equipment Recommendations  None recommended by PT    Recommendations for Other Services       Precautions / Restrictions Precautions Precautions: None Restrictions Weight Bearing Restrictions: No    Mobility  Bed Mobility Overal bed mobility: Independent                Transfers Overall transfer level: Independent                  Ambulation/Gait Ambulation/Gait assistance: Independent Ambulation Distance (Feet): 350 Feet Assistive device: None Gait Pattern/deviations: WFL(Within Functional Limits)   Gait velocity interpretation: at or above normal speed for age/gender General Gait Details: SaO2 91-95% on room air with walking and talking, no LOB   Stairs            Wheelchair Mobility    Modified Rankin (Stroke Patients Only)       Balance Overall balance assessment: Independent                                          Cognition Arousal/Alertness: Awake/alert Behavior During Therapy: WFL for tasks assessed/performed Overall Cognitive Status: Within Functional Limits for tasks assessed                                        Exercises       General Comments        Pertinent Vitals/Pain Pain Assessment: No/denies pain    Home Living                      Prior Function            PT Goals (current goals can now be found in the care plan section) Acute Rehab PT Goals Patient Stated Goal: to get back walking PT Goal Formulation: All assessment and education complete, DC therapy Time For Goal Achievement: 05/31/17 Potential to Achieve Goals: Good Progress towards PT goals: Goals met/education completed, patient discharged from PT    Frequency    Min 2X/week      PT Plan Current plan remains appropriate    Co-evaluation              AM-PAC PT "6 Clicks" Daily Activity  Outcome Measure  Difficulty turning over in bed (including adjusting bedclothes, sheets and blankets)?: None Difficulty moving from lying on back to sitting on the side of the bed? : None Difficulty sitting down on and standing up from a chair with arms (e.g., wheelchair, bedside commode, etc,.)?: None Help needed moving to  and from a bed to chair (including a wheelchair)?: None Help needed walking in hospital room?: None Help needed climbing 3-5 steps with a railing? : A Little 6 Click Score: 23    End of Session   Activity Tolerance: Patient tolerated treatment well Patient left: in bed;with family/visitor present Nurse Communication: Mobility status PT Visit Diagnosis: Difficulty in walking, not elsewhere classified (R26.2)     Time: 4196-2229 PT Time Calculation (min) (ACUTE ONLY): 21 min  Charges:  $Gait Training: 8-22 mins                    G Codes:          Blondell Reveal Kistler 05/30/2017, 11:18 AM [phone]

## 2017-05-30 NOTE — Evaluation (Signed)
Occupational Therapy Evaluation Patient Details Name: Brenda Dunn MRN: [id number] DOB: Mar 02, 1951 Today's Date: 05/30/2017    History of Present Illness SHATORA WEATHERBEE is a 66 y.o. female with medical history significant of hypothyroidism; HTN; HLD: overactive bladder; GERD; depression/anxiety; IBS with constipation; and COPD not on home O2 presenting with persistent cough and SOB despite multiple visits/treatments.   Clinical Impression   OT eval complete.  Pt overall I with ADL activity     Follow Up Recommendations  No OT follow up    Equipment Recommendations  None recommended by OT       Precautions / Restrictions Precautions Precautions: None Restrictions Weight Bearing Restrictions: No      Mobility Bed Mobility Overal bed mobility: Independent                Transfers Overall transfer level: Independent                    Balance Overall balance assessment: Independent                                         ADL either performed or assessed with clinical judgement   ADL Overall ADL's : Modified independent                                             Vision Patient Visual Report: No change from baseline              Pertinent Vitals/Pain Pain Assessment: No/denies pain        Extremity/Trunk Assessment Upper Extremity Assessment Upper Extremity Assessment: Overall WFL for tasks assessed           Communication Communication Communication: No difficulties   Cognition Arousal/Alertness: Awake/alert Behavior During Therapy: WFL for tasks assessed/performed Overall Cognitive Status: Within Functional Limits for tasks assessed                                                Home Living Family/patient expects to be discharged to:: Private residence Living Arrangements: Spouse/significant other Available Help at Discharge: Family Type of Home: House Home Access: Stairs to  enter Secretary/administrator of Steps: 7 x 2 Entrance Stairs-Rails: Right Home Layout: One level     Bathroom Shower/Tub: Producer, television/film/video: Standard     Home Equipment: None          Prior Functioning/Environment Level of Independence: Independent                          OT Goals(Current goals can be found in the care plan section) Acute Rehab OT Goals Patient Stated Goal: to get back walking  OT Frequency:      AM-PAC PT "6 Clicks" Daily Activity     Outcome Measure Help from another person eating meals?: None Help from another person taking care of personal grooming?: None Help from another person toileting, which includes using toliet, bedpan, or urinal?: None Help from another person bathing (including washing, rinsing, drying)?: None Help from another person to put on and taking off regular upper body  clothing?: None Help from another person to put on and taking off regular lower body clothing?: None 6 Click Score: 24   End of Session    Activity Tolerance: Patient tolerated treatment well Patient left: in chair;with call bell/phone within reach                   Time: 1301-1319 OT Time Calculation (min): 18 min Charges:  OT General Charges $OT Visit: 1 Visit OT Evaluation $OT Eval Moderate Complexity: 1 Mod G-Codes:     Lise AuerLori Maniya Dunn, OT [phone]  Einar CrowEDDING, [employer] D 05/30/2017, 1:49 PM

## 2017-05-30 NOTE — Progress Notes (Signed)
D/C instructions reviewed w/ pt and SO. Both verbalize understanding and all questions answered. Pt d/c in stable condition in w/c by NT to SO's car. Pt in possession of d/c packet, script, and all personal belongings.

## 2017-05-30 NOTE — Care Management Note (Signed)
Case Management Note  Patient Details  Name: Elisha PonderSonja E Lennon MRN: [id number] Date of Birth: 04/09/1951  Subjective/Objective:PT recc otpt pulmonary rehab-provided patient w/otpt rehab resource list. Will need otpt Pulmonary manual script-MD aware. Benefit checked for albuterol neb soln/duo nebs-cost $5-Tier 1, neb machine-$5-Informed patient of cost-voiced understanding.[facility] will deliver neb machine to rm prior d/c once ordered.                     Action/Plan:d/c home w/neb machine.   Expected Discharge Date:                  Expected Discharge Plan:  OP Rehab  In-House Referral:     Discharge planning Services  CM Consult  Post Acute Care Choice:    Choice offered to:  Patient  DME Arranged:  Nebulizer machine DME Agency:  [facility]  [facility]:     Status of Service:  Completed, signed off  If discussed at Long Length of Stay Meetings, dates discussed:    Additional Comments:  Lanier ClamMahabir, Minh [place], RN 05/30/2017, 10:29 AM

## 2017-05-31 ENCOUNTER — Ambulatory Visit (INDEPENDENT_AMBULATORY_CARE_PROVIDER_SITE_OTHER): Payer: Medicare Other | Admitting: Family Medicine

## 2017-05-31 ENCOUNTER — Encounter: Payer: Self-pay | Admitting: Family Medicine

## 2017-05-31 VITALS — BP 110/68 | HR 101 | Wt 130.2 lb

## 2017-05-31 DIAGNOSIS — J441 Chronic obstructive pulmonary disease with (acute) exacerbation: Secondary | ICD-10-CM

## 2017-05-31 DIAGNOSIS — J189 Pneumonia, unspecified organism: Secondary | ICD-10-CM

## 2017-05-31 MED ORDER — HYDROCOD POLST-CPM POLST ER 10-8 MG/5ML PO SUER: 5.0000 mL | Freq: Every evening | ORAL | 0 refills | Status: DC | PRN

## 2017-05-31 NOTE — Progress Notes (Signed)
   Subjective:    Patient ID: Elisha PonderSonja E Morace, female    DOB: 03/24/1951, 66 y.o.   MRN: [id number]  HPI she is here after recent discharge from: Hospital. She was sent home yesterday. The discharge summary is still not in the chart. She was seen by multiple people there and she does have underlying COPD and did have pneumonia. She also has had difficulty with hypoxia and hyponatremia. They did stop the doxepin thinking that might of been causing the hyponatremia. She has concerns over the medicines that she is on. Presently she is taking DuoNeb and is also using Stiolto. She has concerns over need of albuterol. It also like to have more cough medication. She states she is using this mainly at night.  Review of Systems     Objective:   Physical Exam Alert and in no distress otherwise not examined.       Assessment & Plan:  COPD with acute exacerbation (HCC)  Community acquired pneumonia, unspecified laterality - Plan: chlorpheniramine-HYDROcodone (TUSSIONEX PENNKINETIC ER) 10-8 MG/5ML SUER I reviewed her medications with her. Explained that she is actually getting albuterol and one of her inhalers. Recommend she continue with her present dosing regimen of the above inhalers. We'll give her a small 2 months prescription for Tussionex and strongly encouraged her to be careful with its use. We will check the state website concerning controlled substances.

## 2017-06-01 ENCOUNTER — Encounter: Payer: Self-pay | Admitting: Internal Medicine

## 2017-06-01 ENCOUNTER — Ambulatory Visit (INDEPENDENT_AMBULATORY_CARE_PROVIDER_SITE_OTHER): Payer: Medicare Other | Admitting: Internal Medicine

## 2017-06-01 VITALS — BP 118/70 | HR 97 | Ht 59.0 in | Wt 128.8 lb

## 2017-06-01 DIAGNOSIS — I10 Essential (primary) hypertension: Secondary | ICD-10-CM

## 2017-06-01 DIAGNOSIS — R911 Solitary pulmonary nodule: Secondary | ICD-10-CM | POA: Diagnosis not present

## 2017-06-01 DIAGNOSIS — J449 Chronic obstructive pulmonary disease, unspecified: Secondary | ICD-10-CM

## 2017-06-01 MED ORDER — BUDESONIDE-FORMOTEROL FUMARATE 80-4.5 MCG/ACT IN AERO: 2.0000 | INHALATION_SPRAY | Freq: Two times a day (BID) | RESPIRATORY_TRACT | 0 refills | Status: DC

## 2017-06-01 NOTE — Patient Instructions (Addendum)
Congratulations on not smoking  Plan A = Automatic =  Breathe clean air / finish all your prednisone  (we could add stiolto x 2 pffs as new Plan A if you start to get worse)   Plan B = Backup Only use your albuterol as a rescue medication to be used if you can't catch your breath by resting or doing a relaxed purse lip breathing pattern.  - The less you use it, the better it will work when you need it. - Ok to use the inhaler up to 2 puffs  every 4 hours if you must but call for appointment if use goes up over your usual need - Don't leave home without it !!  (think of it like the spare tire for your car)   Plan C = Crisis - only use your albuterol nebulizer if you first try Plan B and it fails to help > ok to use the nebulizer up to every 4 hours but if start needing it regularly call for immediate appointment   For cough/ congestion > mucinex 1200 mg every 12 hours as needed    Please schedule a follow up office visit in 2  weeks, sooner if needed  with all medications /inhalers/ solutions in hand so we can verify exactly what you are taking. This includes all medications from all doctors and over the counters

## 2017-06-01 NOTE — Progress Notes (Signed)
Subjective:     Patient ID: Brenda Dunn, female   DOB: 06/01/1951,     MRN: [id number]  HPI  17 yowf says quit smoking May 15 2017 with admit to [facility]:  Admit 05/28/17 D/c     05/30/17    Assessment & Plan:   Principal Problem:   Acute on chronic respiratory failure (HCC) Active Problems:   Hypertension   Post-surgical hypothyroidism   Benzodiazepine dependence (HCC)   Smoker   Hypo-osmolality and hyponatremia   COPD exacerbation (HCC)   Acute respiratory failure:  Unclear etiology. Strongly suspect from copd exacerbation, as she reports active smoking.  CXR shows improving pneumonia.  Plan for CT chest for further evaluation.  Resume solumedrol, prn duonebs and [place] OXYGEN as needed.    Hyponatremia: probably SIADH vs from meds.  Improving.  Continue to monitor.    Hypertension;  Well controlled.    Hyperthyroidism:  Resume synthroid.   Tobacco dependence  Resume nicotine patch.    BZD dependence:  Resume al lmeds except for doxepin sec to hyponatremia   Mild normocytic anemia:  Continue to monitor hemoglobin.   Constipation:  Stool softeners added.      06/02/2017 [facility] office visit/ Amzie Sillas   Chief Complaint  Patient presents with  . Advice Only    Referred from Dr. Blake Divine. hospitalization 10/28-10/30, COPD   Baseline doe up to a mile and a half s stopping including some hills  Not typically on any maint rx but sent home on neb/ multiple forms of albuterol extremely confused re details of care/ how to use meds  Neb was used around 7 h prior to OV  And doe  Now = MMRC2 = can't walk a nl pace on a flat grade s sob but does fine slow and flat eg shopping ok    Does have intermittent mild hb/ cough better on tussionex but still on ACEi    No obvious day to day or daytime variability or assoc excess/ purulent sputum or mucus plugs or hemoptysis or cp or chest tightness, subjective wheeze or overt sinus  symptoms. No unusual  exp hx or h/o childhood pna/ asthma or knowledge of premature birth.  Sleeping ok flat without nocturnal  or early am exacerbation  of respiratory  c/o's or need for noct saba. Also denies any obvious fluctuation of symptoms with weather or environmental changes or other aggravating or alleviating factors except as outlined above   Current Allergies, Medications/Complete Past Medical History, Past Surgical History, Family History, and Social History were reviewed in [employer] record.  ROS  The following are not active complaints unless bolded Hoarseness, sore throat, dysphagia, dental problems, itching, sneezing,  nasal congestion or discharge of excess mucus or purulent secretions, ear ache,   fever, chills, sweats, unintended wt loss or wt gain, classically pleuritic or exertional cp,  orthopnea pnd or leg swelling, presyncope, palpitations, abdominal pain, anorexia, nausea, vomiting, diarrhea  or change in bowel habits or change in bladder habits, change in stools or change in urine, dysuria, hematuria,  rash, arthralgias, visual complaints, headache, numbness, weakness or ataxia or problems with walking or coordination,  change in mood/affect or memory.               Review of Systems     Objective:   Physical Exam    amb extremely animated wf nad   Wt Readings from Last 3 Encounters:  06/01/17 128 lb 12.8 oz (58.4 kg)  05/31/17 130 lb 3.2 oz (59.1 kg)  05/28/17 141 lb 12.1 oz (64.3 kg)    Vital signs reviewed  - Note on arrival 02 sats  97% on RA     HEENT: nl dentition, turbinates bilaterally, and oropharynx. Nl external ear canals without cough reflex   NECK :  without JVD/Nodes/TM/ nl carotid upstrokes bilaterally   LUNGS: no acc muscle use,  Nl contour chest which is clear to A and P bilaterally without cough on insp or exp maneuvers   CV:  RRR  no s3 or murmur or increase in P2, and no edema   ABD:  soft and nontender with nl inspiratory  excursion in the supine position. No bruits or organomegaly appreciated, bowel sounds nl  MS:  Nl gait/ ext warm without deformities, calf tenderness, cyanosis or clubbing No obvious joint restrictions   SKIN: warm and dry without lesions    NEURO:  alert, approp, nl sensorium with  no motor or cerebellar deficits apparent.     I personally reviewed images and agree with radiology impression as follows:   Chest CT w/contrast 05/29/17 2. Diffuse peribronchial thickening and mild upper lobe centrilobular emphysema, consistent with COPD. Emphysema (ICD10-J43.9). 3. 5 mm nodule in the lingula. No follow-up needed if patient is low-risk. Non-contrast chest CT can be considered in 12 months  Assessment:

## 2017-06-02 ENCOUNTER — Telehealth: Payer: Self-pay | Admitting: Internal Medicine

## 2017-06-02 DIAGNOSIS — R911 Solitary pulmonary nodule: Secondary | ICD-10-CM | POA: Insufficient documentation

## 2017-06-02 NOTE — Assessment & Plan Note (Addendum)
See CT 05/29/17  SPN  Lingula 5 mm > placed in reminder file for recall 05/29/18

## 2017-06-02 NOTE — Assessment & Plan Note (Signed)
Variably reports quit smoking - 06/01/2017  After extensive coaching HFA effectiveness =    75% from a baseline of 25  Baseline ex tol per pt reports is very good and likely if she does continue off cigs should stabilize out with reasonable function   I reviewed the Fletcher curve with the patient that basically indicates  if you quit smoking when your best day FEV1 is still   preserved (as is should prove to be   the case here)  it is highly unlikely you will progress to severe disease and informed the patient there was  no medication on the market that has proven to alter the curve/ its downward trajectory  or the likelihood of progression of their disease(unlike other chronic medical conditions such as atheroclerosis where we do think we can change the natural hx with risk reducing meds)    Therefore stopping smoking and maintaining abstinence is the most important aspect of care, not choice of inhalers or for that matter, doctors.    rx is focused going forward on symptom management and reduction of exacerbations, not trying to change the natural hx of the dz so if not well controlled on prn saba then ok to add back laba/lama as a maint rx (since she already has stiolto on hand but not using) pending f/u here with full pfts   Total time devoted to counseling  > 50 % of initial 60 min office visit:  review case (including extensive epic records)  with pt/ discussion of options/alternatives/ personally creating written customized instructions in presence of pt  then going over those specific Instructions directly with the pt including how to use all of the meds but in particular covering each new medication in detail and the difference between the maintenance= "automatic" meds and the prns using an action plan format for the latter (If this problem/symptom => do that organization reading Left to right).  Please see AVS from this visit for a full list of these instructions which I personally wrote for this  pt and  are unique to this visit.

## 2017-06-02 NOTE — Telephone Encounter (Signed)
She seemed very agitated typical of a pt on steroids and is on ACEi which may need to be d/c'd on return so best to set up with Tammy NP and let her sort out next step which may just be f/u PCP

## 2017-06-02 NOTE — Telephone Encounter (Signed)
Spoke with patient. She is aware of MW's recs. Appt with MW has been cancelled. She has been scheduled with TP on 11/16 at 1115. She verbalized understanding. Nothing else needed at time of call.

## 2017-06-02 NOTE — Telephone Encounter (Signed)
Called spoke with patient at length regarding her [id number] consult with MW Pt is requesting to change providers She is currently scheduled for 11.15.18 for 2 week follow up - [facility]  Dr Sherene SiresWert please advise, thank you

## 2017-06-02 NOTE — Assessment & Plan Note (Signed)
Note pt on acei.  ACE inhibitors are problematic in  pts with airway complaints because  even experienced pulmonologists can't always distinguish ace effects from copd/asthma.  By themselves they don't actually cause a problem, much like oxygen can't by itself start a fire, but they certainly serve as a powerful catalyst or enhancer for any "fire"  or inflammatory process in the upper airway, be it caused by an ET  tube or more commonly reflux (especially in the obese or pts with known GERD or who are on biphoshonates).    if symptoms flare would have very low threshold to replace acei with arb on a trial basis .

## 2017-06-05 NOTE — Discharge Summary (Signed)
Physician Discharge Summary  [name]:[id number] DOB: Mar 06, 1951 DOA: 05/28/2017  PCP: Avanell Shackleton, [name]  Admit date: 05/28/2017 Discharge date: 05/30/2017  Admitted From: Home Disposition: Home.   Recommendations for Outpatient Follow-up:  1. Follow up with PCP in 1-2 weeks 2. Please obtain BMP/CBC in one week Please follow up with pulmonary as recommended.   Discharge Condition:stable.  CODE STATUS:full code.  Diet recommendation: Heart Healthy   Brief/Interim Summary: Brenda Dunn a 66 y.o.femalewith medical history significant of hypothyroidism; HTN; HLD: overactive bladder; GERD; depression/anxiety; IBS with constipation; and COPD not on home O2 presenting with persistent cough and SOB despite multiple visits/treatments    Discharge Diagnoses:  Principal Problem:   Acute on chronic respiratory failure (HCC) Active Problems:   Essential hypertension   Post-surgical hypothyroidism   Benzodiazepine dependence (HCC)   Smoker   Hypo-osmolality and hyponatremia   COPD exacerbation (HCC)   Acute respiratory failure:  Unclear etiology. Strongly suspect from copd exacerbation, as she reports active smoking.  CXR shows improving pneumonia.   CT chest and abdomen showed Diffuse peribronchial thickening and mild upper lobe centrilobular emphysema, consistent with COPD. Emphysema  5 mm nodule in the lingula. No follow-up needed if patient is low-risk. Non-contrast chest CT can be considered in 12 months if patient is high-risk. This recommendation follows the consensus statement: Guidelines for Management of Incidental Pulmonary Nodules  Scheduled an appt with pulmonary.  She was discharged on tapering steroids and duonebs.     Hyponatremia: probably SIADH vs from meds.  Improving.  Continue to monitor.    Hypertension;  Well controlled.    Hyperthyroidism:  Resume synthroid.   Tobacco dependence  Resume nicotine patch.    BZD  dependence:  Resume all meds except for doxepin sec to hyponatremia   Mild normocytic anemia:  Continue to monitor hemoglobin.   Constipation:  Stool softeners added.      Discharge Instructions  Discharge Instructions    Diet - low sodium heart healthy   Complete by:  As directed    Discharge instructions   Complete by:  As directed    Follow up with Pulmonology on Thursday at 3 pm.  Follow up with PCP in one week.     Allergies as of 05/30/2017   No Known Allergies     Medication List    STOP taking these medications   doxepin 50 MG capsule Commonly known as:  SINEQUAN   levofloxacin 500 MG tablet Commonly known as:  LEVAQUIN     TAKE these medications   albuterol 108 (90 Base) MCG/ACT inhaler Commonly known as:  PROVENTIL HFA;VENTOLIN HFA Inhale 2 puffs into the lungs every 4 (four) hours as needed for wheezing or shortness of breath.   atorvastatin 40 MG tablet Commonly known as:  LIPITOR Take 40 mg by mouth daily.   diazepam 5 MG tablet Commonly known as:  VALIUM Take 5 mg by mouth. Take 1 tablet in the am, 1/2 tablet at lunch and 1 tablet at night   esomeprazole 40 MG capsule Commonly known as:  NEXIUM Take 1 capsule (40 mg total) by mouth daily.   gabapentin 300 MG capsule Commonly known as:  NEURONTIN Take 300 mg by mouth 3 (three) times daily.   guaiFENesin-dextromethorphan 100-10 MG/5ML syrup Commonly known as:  ROBITUSSIN DM Take 5 mLs by mouth every 4 (four) hours as needed for cough (chest congestion).   HYDROcodone-homatropine 5-1.5 MG/5ML syrup Commonly known as:  HYCODAN Take 5 mLs  by mouth every 6 (six) hours as needed for cough.   ipratropium-albuterol 0.5-2.5 (3) MG/3ML Soln Commonly known as:  DUONEB Take 3 mLs by nebulization 3 (three) times daily.   levothyroxine 100 MCG tablet Commonly known as:  SYNTHROID, LEVOTHROID Take 100 mcg by mouth daily before breakfast.   LINZESS 290 MCG Caps capsule Generic drug:   linaclotide Take 290 mcg by mouth daily before breakfast.   lisinopril 40 MG tablet Commonly known as:  PRINIVIL,ZESTRIL Take 40 mg by mouth daily.   MYRBETRIQ 50 MG Tb24 tablet Generic drug:  mirabegron ER Take 50 mg by mouth daily.   OXcarbazepine 150 MG tablet Commonly known as:  TRILEPTAL Take 450 mg by mouth at bedtime.   predniSONE 10 MG (21) Tbpk tablet Commonly known as:  STERAPRED UNI-PAK 21 TAB Prednisone 60 mg daily  for 3 days followed by  Prednisone 40 mg daily for 3 days followed by  Prednisone 20 mg daily for 3 days followed by  Prednisone 10 mg daily for 3 days.   STOOL SOFTENER PO Take 3 tablets by mouth daily.   Tiotropium Bromide-Olodaterol 2.5-2.5 MCG/ACT Aers Commonly known as:  STIOLTO RESPIMAT Inhale 2 puffs into the lungs daily.      Follow-up Information    [facility] - Dme Follow up.   Why:  nebulizer machine Contact information: [address] [place] [address] [phone]        [facility] Follow up on 06/01/2017.   Specialty:  Pulmonology Why:  at 3 pm , will need pulmonary function test. Contact information: [address] [place] [address] [phone]       Avanell Shackleton, [name]. Schedule an appointment as soon as possible for a visit in 1 week(s).   Specialty:  Family Medicine Contact information: [address] [place] [address] [phone]          No Known Allergies  Consultations:  None.    Procedures/Studies: Dg Chest 2 View  Result Date: 05/28/2017 CLINICAL DATA:  Pt with cough x 2-3 weeks; smoker; pt was dx about a week ago with pneumonia; COPD EXAM: CHEST  2 VIEW COMPARISON:  05/22/2017 FINDINGS: Lungs are mildly hyperinflated. There is mild perihilar peribronchial thickening. There has been improvement in aeration of the lingula and right lung base. No new infiltrates are identified. Heart size is normal. There is mild midthoracic  spondylosis. IMPRESSION: 1. Bronchitic changes. 2. Interval resolution of infiltrates. Electronically Signed   By: Norva Pavlov M.D.   On: 05/28/2017 12:58   Dg Chest 2 View  Result Date: 05/22/2017 CLINICAL DATA:  Cough and chest congestion. Recent pneumonia. Smoker. EXAM: CHEST  2 VIEW COMPARISON:  05/13/2017. FINDINGS: Normal sized heart. Small amount of linear density at both lung bases. Minimal airspace opacity in the lingula with a mild increase. Minimal patchy opacity at the right lung base. Thoracic spine degenerative changes and mild thoracolumbar scoliosis. IMPRESSION: 1. Minimally increased patchy opacity in the lingula suspicious for pneumonia. 2. Interval minimal patchy opacity at the right lung base. This could represent atelectasis or pneumonia. 3. Mild linear atelectasis at both lung bases. Electronically Signed   By: Beckie Salts M.D.   On: 05/22/2017 13:50   Dg Chest 2 View  Result Date: 05/13/2017 CLINICAL DATA:  Shortness of breath, cough, wheezing EXAM: CHEST  2 VIEW COMPARISON:  05/13/2017 at 1022 hours FINDINGS: Lingular opacity, suspicious for pneumonia. Mild right basilar opacity, atelectasis versus pneumonia. No  pleural effusion or pneumothorax. The heart is normal in size. Degenerative changes of the visualized thoracolumbar spine. IMPRESSION: Lingular opacity, suspicious for pneumonia. Mild right basilar opacity, atelectasis versus pneumonia. Electronically Signed   By: Charline BillsSriyesh  Krishnan M.D.   On: 05/13/2017 12:00   Ct Chest W Contrast  Result Date: 05/29/2017 CLINICAL DATA:  Cough for the past 2 weeks.  Hyponatremia. EXAM: CT CHEST, ABDOMEN, AND PELVIS WITH CONTRAST TECHNIQUE: Multidetector CT imaging of the chest, abdomen and pelvis was performed following the standard protocol during bolus administration of intravenous contrast. CONTRAST:  100mL ISOVUE-300 IOPAMIDOL (ISOVUE-300) INJECTION 61%, 30mL ISOVUE-300 IOPAMIDOL (ISOVUE-300) INJECTION 61% COMPARISON:  Chest  x-ray from yesterday. FINDINGS: CT CHEST FINDINGS Cardiovascular: No significant vascular findings. Normal heart size. No pericardial effusion. Normal caliber thoracic aorta. Aortic atherosclerotic vascular calcifications. Mediastinum/Nodes: No enlarged mediastinal, hilar, or axillary lymph nodes. Thyroid gland, trachea, and esophagus demonstrate no significant findings. Lungs/Pleura: Mild upper lobe centrilobular emphysema. Diffuse peribronchial thickening. Scattered areas of scarring. Bibasilar subsegmental atelectasis. 5 mm solid nodule in the lingula (series 4, image 85). No consolidation, pleural effusion, or pneumothorax. Musculoskeletal: No acute or significant osseous findings. Degenerative changes of the thoracic spine. CT ABDOMEN PELVIS FINDINGS Hepatobiliary: No focal liver abnormality is seen. No gallstones, gallbladder wall thickening, or biliary dilatation. Pancreas: Unremarkable. No pancreatic ductal dilatation or surrounding inflammatory changes. Spleen: Normal in size without focal abnormality. Adrenals/Urinary Tract: Adrenal glands are unremarkable. Kidneys are normal, without renal calculi, focal lesion, or hydronephrosis. Bladder is prominently distended. Stomach/Bowel: Small hiatal hernia. The stomach is otherwise within normal limits. There are a few loops of mildly distended small bowel in the left abdomen. There is no evidence of obstruction. Oral contrast reaches the cecum. Moderate colonic stool burden. Normal appendix. Vascular/Lymphatic: Aortic atherosclerosis. No enlarged abdominal or pelvic lymph nodes. Reproductive: Uterus and bilateral adnexa are unremarkable. Other: No free fluid or pneumoperitoneum. Musculoskeletal: No acute or significant osseous findings. Dextroscoliosis and degenerative changes of the lower spine. Grade 1 anterolisthesis of L4 on L5. Small amount of stranding and a few foci of subcutaneous air along the right anterolateral lower abdominal wall. Correlate for  recent injection. IMPRESSION: 1.  No acute intrathoracic process. 2. Diffuse peribronchial thickening and mild upper lobe centrilobular emphysema, consistent with COPD. Emphysema (ICD10-J43.9). 3. 5 mm nodule in the lingula. No follow-up needed if patient is low-risk. Non-contrast chest CT can be considered in 12 months if patient is high-risk. This recommendation follows the consensus statement: Guidelines for Management of Incidental Pulmonary Nodules Detected on CT Images: From the Fleischner Society 2017; Radiology 2017; 284:228-243. 4.  No acute intra-abdominal process. 5. There are a few loops of mildly distended small bowel in the left abdomen, which may reflect dysmotility. No evidence of obstruction. 6.  Prominent stool throughout the colon favors constipation. 7.  Aortic atherosclerosis (ICD10-I70.0). Electronically Signed   By: Obie DredgeWilliam T Derry M.D.   On: 05/29/2017 12:26   Ct Abdomen Pelvis W Contrast  Result Date: 05/29/2017 CLINICAL DATA:  Cough for the past 2 weeks.  Hyponatremia. EXAM: CT CHEST, ABDOMEN, AND PELVIS WITH CONTRAST TECHNIQUE: Multidetector CT imaging of the chest, abdomen and pelvis was performed following the standard protocol during bolus administration of intravenous contrast. CONTRAST:  100mL ISOVUE-300 IOPAMIDOL (ISOVUE-300) INJECTION 61%, 30mL ISOVUE-300 IOPAMIDOL (ISOVUE-300) INJECTION 61% COMPARISON:  Chest x-ray from yesterday. FINDINGS: CT CHEST FINDINGS Cardiovascular: No significant vascular findings. Normal heart size. No pericardial effusion. Normal caliber thoracic aorta. Aortic atherosclerotic vascular calcifications. Mediastinum/Nodes: No enlarged mediastinal, hilar,  or axillary lymph nodes. Thyroid gland, trachea, and esophagus demonstrate no significant findings. Lungs/Pleura: Mild upper lobe centrilobular emphysema. Diffuse peribronchial thickening. Scattered areas of scarring. Bibasilar subsegmental atelectasis. 5 mm solid nodule in the lingula (series 4, image  85). No consolidation, pleural effusion, or pneumothorax. Musculoskeletal: No acute or significant osseous findings. Degenerative changes of the thoracic spine. CT ABDOMEN PELVIS FINDINGS Hepatobiliary: No focal liver abnormality is seen. No gallstones, gallbladder wall thickening, or biliary dilatation. Pancreas: Unremarkable. No pancreatic ductal dilatation or surrounding inflammatory changes. Spleen: Normal in size without focal abnormality. Adrenals/Urinary Tract: Adrenal glands are unremarkable. Kidneys are normal, without renal calculi, focal lesion, or hydronephrosis. Bladder is prominently distended. Stomach/Bowel: Small hiatal hernia. The stomach is otherwise within normal limits. There are a few loops of mildly distended small bowel in the left abdomen. There is no evidence of obstruction. Oral contrast reaches the cecum. Moderate colonic stool burden. Normal appendix. Vascular/Lymphatic: Aortic atherosclerosis. No enlarged abdominal or pelvic lymph nodes. Reproductive: Uterus and bilateral adnexa are unremarkable. Other: No free fluid or pneumoperitoneum. Musculoskeletal: No acute or significant osseous findings. Dextroscoliosis and degenerative changes of the lower spine. Grade 1 anterolisthesis of L4 on L5. Small amount of stranding and a few foci of subcutaneous air along the right anterolateral lower abdominal wall. Correlate for recent injection. IMPRESSION: 1.  No acute intrathoracic process. 2. Diffuse peribronchial thickening and mild upper lobe centrilobular emphysema, consistent with COPD. Emphysema (ICD10-J43.9). 3. 5 mm nodule in the lingula. No follow-up needed if patient is low-risk. Non-contrast chest CT can be considered in 12 months if patient is high-risk. This recommendation follows the consensus statement: Guidelines for Management of Incidental Pulmonary Nodules Detected on CT Images: From the Fleischner Society 2017; Radiology 2017; 284:228-243. 4.  No acute intra-abdominal process.  5. There are a few loops of mildly distended small bowel in the left abdomen, which may reflect dysmotility. No evidence of obstruction. 6.  Prominent stool throughout the colon favors constipation. 7.  Aortic atherosclerosis (ICD10-I70.0). Electronically Signed   By: Obie Dredge M.D.   On: 05/29/2017 12:26   Dg Chest Port 1 View  Result Date: 05/13/2017 CLINICAL DATA:  Cough and shortness of breath for the past week. Decreased oxygen saturation. EXAM: PORTABLE CHEST 1 VIEW COMPARISON:  None. FINDINGS: Normal cardiac silhouette and mediastinal contours. Minimal left basilar/retrocardiac heterogeneous opacities. No pleural effusion or pneumothorax. No evidence of edema. No acute osseus abnormalities. IMPRESSION: Minimal left basilar / retrocardiac opacities, atelectasis versus infiltrate. Further evaluation with a PA and lateral chest radiograph may be obtained as clinically indicated. Electronically Signed   By: Simonne Come M.D.   On: 05/13/2017 11:00       Subjective: No new complaints.   Discharge Exam: Vitals:   05/30/17 0923 05/30/17 1331  BP: 119/65 127/64  Pulse: 95 85  Resp: 16 18  Temp:  98.3 F (36.8 C)  SpO2: 99% 96%   Vitals:   05/30/17 0512 05/30/17 0806 05/30/17 0923 05/30/17 1331  BP: 109/73  119/65 127/64  Pulse: 92  95 85  Resp: 18  16 18   Temp: 97.9 F (36.6 C)   98.3 F (36.8 C)  TempSrc: Oral   Oral  SpO2: 98% 98% 99% 96%  Weight:      Height:        General: Pt is alert, awake, not in acute distress Cardiovascular: RRR, S1/S2 +, no rubs, no gallops Respiratory: CTA bilaterally, no wheezing, no rhonchi Abdominal: Soft, NT, ND, bowel sounds +  Extremities: no edema, no cyanosis    The results of significant diagnostics from this hospitalization (including imaging, microbiology, ancillary and laboratory) are listed below for reference.     Microbiology: Recent Results (from the past 240 hour(s))  Respiratory Panel by PCR     Status: None    Collection Time: 05/28/17  9:25 PM  Result Value Ref Range Status   Adenovirus NOT DETECTED NOT DETECTED Final   Coronavirus 229E NOT DETECTED NOT DETECTED Final   Coronavirus HKU1 NOT DETECTED NOT DETECTED Final   Coronavirus NL63 NOT DETECTED NOT DETECTED Final   Coronavirus OC43 NOT DETECTED NOT DETECTED Final   Metapneumovirus NOT DETECTED NOT DETECTED Final   Rhinovirus / Enterovirus NOT DETECTED NOT DETECTED Final   Influenza A NOT DETECTED NOT DETECTED Final   Influenza B NOT DETECTED NOT DETECTED Final   Parainfluenza Virus 1 NOT DETECTED NOT DETECTED Final   Parainfluenza Virus 2 NOT DETECTED NOT DETECTED Final   Parainfluenza Virus 3 NOT DETECTED NOT DETECTED Final   Parainfluenza Virus 4 NOT DETECTED NOT DETECTED Final   Respiratory Syncytial Virus NOT DETECTED NOT DETECTED Final   Bordetella pertussis NOT DETECTED NOT DETECTED Final   Chlamydophila pneumoniae NOT DETECTED NOT DETECTED Final   Mycoplasma pneumoniae NOT DETECTED NOT DETECTED Final    Comment: Performed at [facility], [address], [place], [place] [address]     Labs: BNP (last 3 results) Recent Labs    05/13/17 1029  BNP 13.0   Basic Metabolic Panel: Recent Labs  Lab 05/30/17 1015  NA 134*  K 4.3  CL 98*  CO2 25  GLUCOSE 105*  BUN 17  CREATININE 0.85  CALCIUM 9.7   Liver Function Tests: No results for input(s): AST, ALT, ALKPHOS, BILITOT, PROT, ALBUMIN in the last 168 hours. No results for input(s): LIPASE, AMYLASE in the last 168 hours. No results for input(s): AMMONIA in the last 168 hours. CBC: No results for input(s): WBC, NEUTROABS, HGB, HCT, MCV, PLT in the last 168 hours. Cardiac Enzymes: No results for input(s): CKTOTAL, CKMB, CKMBINDEX, TROPONINI in the last 168 hours. BNP: Invalid input(s): POCBNP CBG: No results for input(s): GLUCAP in the last 168 hours. D-Dimer No results for input(s): DDIMER in the last 72 hours. Hgb A1c No results for input(s): HGBA1C in the  last 72 hours. Lipid Profile No results for input(s): CHOL, HDL, LDLCALC, TRIG, CHOLHDL, LDLDIRECT in the last 72 hours. Thyroid function studies No results for input(s): TSH, T4TOTAL, T3FREE, THYROIDAB in the last 72 hours.  Invalid input(s): FREET3 Anemia work up No results for input(s): VITAMINB12, FOLATE, FERRITIN, TIBC, IRON, RETICCTPCT in the last 72 hours. Urinalysis    Component Value Date/Time   LABSPEC 1.020 05/18/2017 1402   BILIRUBINUR negative 05/18/2017 1402   KETONESUR negative 05/18/2017 1402   PROTEINUR negative 05/18/2017 1402   NITRITE Negative 05/18/2017 1402   LEUKOCYTESUR Negative 05/18/2017 1402   Sepsis Labs Invalid input(s): PROCALCITONIN,  WBC,  LACTICIDVEN Microbiology Recent Results (from the past 240 hour(s))  Respiratory Panel by PCR     Status: None   Collection Time: 05/28/17  9:25 PM  Result Value Ref Range Status   Adenovirus NOT DETECTED NOT DETECTED Final   Coronavirus 229E NOT DETECTED NOT DETECTED Final   Coronavirus HKU1 NOT DETECTED NOT DETECTED Final   Coronavirus NL63 NOT DETECTED NOT DETECTED Final   Coronavirus OC43 NOT DETECTED NOT DETECTED Final   Metapneumovirus NOT DETECTED NOT DETECTED Final  Rhinovirus / Enterovirus NOT DETECTED NOT DETECTED Final   Influenza A NOT DETECTED NOT DETECTED Final   Influenza B NOT DETECTED NOT DETECTED Final   Parainfluenza Virus 1 NOT DETECTED NOT DETECTED Final   Parainfluenza Virus 2 NOT DETECTED NOT DETECTED Final   Parainfluenza Virus 3 NOT DETECTED NOT DETECTED Final   Parainfluenza Virus 4 NOT DETECTED NOT DETECTED Final   Respiratory Syncytial Virus NOT DETECTED NOT DETECTED Final   Bordetella pertussis NOT DETECTED NOT DETECTED Final   Chlamydophila pneumoniae NOT DETECTED NOT DETECTED Final   Mycoplasma pneumoniae NOT DETECTED NOT DETECTED Final    Comment: Performed at [facility], [address] [place], [place] [address]     Time coordinating discharge: Over 30  minutes  SIGNED:   Kathlen Mody, MD  [facility] 06/05/2017, 11:35 PM Pager   If 7PM-7AM, please contact night-coverage [email or web address] Password [email or web address]

## 2017-06-12 ENCOUNTER — Other Ambulatory Visit: Payer: Self-pay | Admitting: Family Medicine

## 2017-06-12 NOTE — Telephone Encounter (Signed)
Ok

## 2017-06-12 NOTE — Telephone Encounter (Signed)
Is this okay to refill? 

## 2017-06-15 ENCOUNTER — Ambulatory Visit: Payer: Medicare Other | Admitting: Internal Medicine

## 2017-06-16 ENCOUNTER — Ambulatory Visit: Payer: Medicare Other | Admitting: Adult Health

## 2017-06-16 ENCOUNTER — Encounter: Payer: Self-pay | Admitting: Adult Health

## 2017-06-16 VITALS — BP 112/64 | HR 78 | Ht 59.5 in | Wt 132.6 lb

## 2017-06-16 DIAGNOSIS — J441 Chronic obstructive pulmonary disease with (acute) exacerbation: Secondary | ICD-10-CM

## 2017-06-16 DIAGNOSIS — R911 Solitary pulmonary nodule: Secondary | ICD-10-CM | POA: Diagnosis not present

## 2017-06-16 NOTE — Patient Instructions (Addendum)
Work on not smoking .  Mucinex DM Twice daily As needed  Cough/congestion  CT chest 05/2018 to follow lung nodule  Can discuss with Primary MD that Lisinopril may aggravate cough if it returns.  Follow up with Dr. Isaiah SergeMannam in 3 months with PFT and  As needed

## 2017-06-16 NOTE — Assessment & Plan Note (Signed)
Plan  CT chest 05/2018 .

## 2017-06-16 NOTE — Assessment & Plan Note (Signed)
Recent flare now resolved  Smoking cessation encouraged.  Spirometry shows no significant obstruction w. Moderate restriction  CT chest showed emphysema  Return for full PFT .   Plan  Patient Instructions  Work on not smoking .  Mucinex DM Twice daily As needed  Cough/congestion  CT chest 05/2018 to follow lung nodule  Can discuss with Primary MD that Lisinopril may aggravate cough if it returns.  Follow up with Dr. Isaiah SergeMannam in 3 months with PFT and  As needed

## 2017-06-16 NOTE — Progress Notes (Signed)
 @Patient  ID: Brenda PonderSonja E Dunn, female    DOB: 02/16/1951, 66 y.o.   MRN: [id number]  Chief Complaint  Patient presents with  . Follow-up    COPD     Referring provider: Avanell ShackletonHenson, [name], [name]  HPI: 66 yo female active smoker seen for pulmonary consult 06/01/17 after recent hospitalization for presumed COPD exacerbation -PFT pending  .   TEST  CT chest 05/2017 noted Emphysema, 5 mm lingula nodule .   06/16/2017 Follow up : COPD  Pt returns for 2 week follow up .  She was admitted in late Oct for COPD exacerbation .  CT chest showed emphysema and 5 mm lingula nodule . She was treated for PNA in early to mid Oct with antibiotics. Serial CXR showed resolution of infiltrates.  She says she is smoking 1/2 PPD and using vaps. Not interested in stopping smoking . Says "everyone dies of something and if she is it is going to be with a cigarette. "  She stopped the inhalers recommended. Feels she does not need them. Feels her breathing only got bad because of bad weather outside.  Spirometry today shows moderate restriction with FVC at 73%, FEV1 70%, ratio 73.  She is feeling better with little cough . Denies dyspnea or wheezing .    No Known Allergies  Immunization History  Administered Date(s) Administered  . Influenza, High Dose Seasonal PF 05/07/2017    Past Medical History:  Diagnosis Date  . Anxiety   . Benzodiazepine dependence (HCC)   . Colonic polyp    mulitple polyps. repeat in 05/2019  . Constipation    IBS  . COPD (chronic obstructive pulmonary disease) (HCC)   . Depression   . GERD (gastroesophageal reflux disease)   . Hyperlipidemia   . Hypertension   . Insomnia   . Migraine headache   . OAB (overactive bladder)   . Osteoarthritis   . Post-surgical hypothyroidism   . Scoliosis     Tobacco History: Social History   Tobacco Use  Smoking Status Current Every Day Smoker  . Packs/day: 1.50  . Years: 44.00  . Pack years: 66.00  Smokeless Tobacco Never Used    Ready to quit: No Counseling given: Yes   Outpatient Encounter Medications as of 06/16/2017  Medication Sig  . atorvastatin (LIPITOR) 40 MG tablet Take 40 mg by mouth daily.  . diazepam (VALIUM) 5 MG tablet Take 5 mg by mouth. Take 1 tablet in the am, 1/2 tablet at lunch and 1 tablet at night  . Docusate Calcium (STOOL SOFTENER PO) Take 3 tablets by mouth daily.  Marland Kitchen. esomeprazole (NEXIUM) 40 MG capsule Take 1 capsule (40 mg total) by mouth daily.  Marland Kitchen. gabapentin (NEURONTIN) 300 MG capsule Take 300 mg by mouth 3 (three) times daily.  Marland Kitchen. levothyroxine (SYNTHROID, LEVOTHROID) 100 MCG tablet Take 100 mcg by mouth daily before breakfast.  . LINZESS 290 MCG CAPS capsule TAKE 1 CAPSULE BY MOUTH ONCE A DAY  . lisinopril (PRINIVIL,ZESTRIL) 40 MG tablet Take 40 mg by mouth daily.  . mirabegron ER (MYRBETRIQ) 50 MG TB24 tablet Take 50 mg by mouth daily.  . OXcarbazepine (TRILEPTAL) 150 MG tablet Take 450 mg by mouth at bedtime.  Marland Kitchen. albuterol (PROVENTIL HFA;VENTOLIN HFA) 108 (90 Base) MCG/ACT inhaler Inhale 2 puffs into the lungs every 4 (four) hours as needed for wheezing or shortness of breath. (Patient not taking: Reported on 06/16/2017)  . ipratropium-albuterol (DUONEB) 0.5-2.5 (3) MG/3ML SOLN Take 3 mLs by nebulization 3 (  three) times daily. (Patient not taking: Reported on 06/16/2017)  . Tiotropium Bromide-Olodaterol (STIOLTO RESPIMAT) 2.5-2.5 MCG/ACT AERS Inhale 2 puffs into the lungs daily. (Patient not taking: Reported on 06/16/2017)  . [DISCONTINUED] chlorpheniramine-HYDROcodone (TUSSIONEX PENNKINETIC ER) 10-8 MG/5ML SUER Take 5 mLs by mouth at bedtime as needed for cough. (Patient not taking: Reported on 06/16/2017)  . [DISCONTINUED] guaiFENesin-dextromethorphan (ROBITUSSIN DM) 100-10 MG/5ML syrup Take 5 mLs by mouth every 4 (four) hours as needed for cough (chest congestion). (Patient not taking: Reported on 06/16/2017)  . [DISCONTINUED] HYDROcodone-homatropine (HYCODAN) 5-1.5 MG/5ML syrup Take 5  mLs by mouth every 6 (six) hours as needed for cough. (Patient not taking: Reported on 05/31/2017)  . [DISCONTINUED] predniSONE (STERAPRED UNI-PAK 21 TAB) 10 MG (21) TBPK tablet Prednisone 60 mg daily  for 3 days followed by  Prednisone 40 mg daily for 3 days followed by  Prednisone 20 mg daily for 3 days followed by  Prednisone 10 mg daily for 3 days. (Patient not taking: Reported on 06/16/2017)   No facility-administered encounter medications on file as of 06/16/2017.      Review of Systems  Constitutional:   No  weight loss, night sweats,  Fevers, chills, fatigue, or  lassitude.  HEENT:   No headaches,  Difficulty swallowing,  Tooth/dental problems, or  Sore throat,                No sneezing, itching, ear ache, nasal congestion, post nasal drip,   CV:  No chest pain,  Orthopnea, PND, swelling in lower extremities, anasarca, dizziness, palpitations, syncope.   GI  No heartburn, indigestion, abdominal pain, nausea, vomiting, diarrhea, change in bowel habits, loss of appetite, bloody stools.   Resp: No shortness of breath with exertion or at rest.  No excess mucus, no productive cough,  No non-productive cough,  No coughing up of blood.  No change in color of mucus.  No wheezing.  No chest wall deformity  Skin: no rash or lesions.  GU: no dysuria, change in color of urine, no urgency or frequency.  No flank pain, no hematuria   MS:  No joint pain or swelling.  No decreased range of motion.  No back pain.    Physical Exam  BP 112/64 (BP Location: Left Arm, Cuff Size: Normal)   Pulse 78   Ht 4' 11.5" (1.511 m)   Wt 132 lb 9.6 oz (60.1 kg)   SpO2 97%   BMI 26.33 kg/m   GEN: A/Ox3; pleasant , NAD    HEENT:  [place]/AT,  EACs-clear, TMs-wnl, NOSE-clear, THROAT-clear, no lesions, no postnasal drip or exudate noted.   NECK:  Supple w/ fair ROM; no JVD; normal carotid impulses w/o bruits; no thyromegaly or nodules palpated; no lymphadenopathy.    RESP  Clear  P & A; w/o, wheezes/  rales/ or rhonchi. no accessory muscle use, no dullness to percussion  CARD:  RRR, no m/r/g, no peripheral edema, pulses intact, no cyanosis or clubbing.  GI:   Soft & nt; nml bowel sounds; no organomegaly or masses detected.   Musco: Warm bil, no deformities or joint swelling noted.   Neuro: alert, no focal deficits noted.    Skin: Warm, no lesions or rashes    Lab Results:   BMET  BNP  ProBNP No results found for: PROBNP  Imaging: Dg Chest 2 View  Result Date: 05/28/2017 CLINICAL DATA:  Pt with cough x 2-3 weeks; smoker; pt was dx about a week ago with pneumonia; COPD EXAM: CHEST  2 VIEW COMPARISON:  05/22/2017 FINDINGS: Lungs are mildly hyperinflated. There is mild perihilar peribronchial thickening. There has been improvement in aeration of the lingula and right lung base. No new infiltrates are identified. Heart size is normal. There is mild midthoracic spondylosis. IMPRESSION: 1. Bronchitic changes. 2. Interval resolution of infiltrates. Electronically Signed   By: Norva Pavlov M.D.   On: 05/28/2017 12:58   Dg Chest 2 View  Result Date: 05/22/2017 CLINICAL DATA:  Cough and chest congestion. Recent pneumonia. Smoker. EXAM: CHEST  2 VIEW COMPARISON:  05/13/2017. FINDINGS: Normal sized heart. Small amount of linear density at both lung bases. Minimal airspace opacity in the lingula with a mild increase. Minimal patchy opacity at the right lung base. Thoracic spine degenerative changes and mild thoracolumbar scoliosis. IMPRESSION: 1. Minimally increased patchy opacity in the lingula suspicious for pneumonia. 2. Interval minimal patchy opacity at the right lung base. This could represent atelectasis or pneumonia. 3. Mild linear atelectasis at both lung bases. Electronically Signed   By: Beckie Salts M.D.   On: 05/22/2017 13:50   Ct Chest W Contrast  Result Date: 05/29/2017 CLINICAL DATA:  Cough for the past 2 weeks.  Hyponatremia. EXAM: CT CHEST, ABDOMEN, AND PELVIS WITH  CONTRAST TECHNIQUE: Multidetector CT imaging of the chest, abdomen and pelvis was performed following the standard protocol during bolus administration of intravenous contrast. CONTRAST:  ISOVUE-300 IOPAMIDOL (ISOVUE-300) INJECTION 61%, 30mL ISOVUE-300 IOPAMIDOL (ISOVUE-300) INJECTION 61% COMPARISON:  Chest x-ray from yesterday. FINDINGS: CT CHEST FINDINGS Cardiovascular: No significant vascular findings. Normal heart size. No pericardial effusion. Normal caliber thoracic aorta. Aortic atherosclerotic vascular calcifications. Mediastinum/Nodes: No enlarged mediastinal, hilar, or axillary lymph nodes. Thyroid gland, trachea, and esophagus demonstrate no significant findings. Lungs/Pleura: Mild upper lobe centrilobular emphysema. Diffuse peribronchial thickening. Scattered areas of scarring. Bibasilar subsegmental atelectasis. 5 mm solid nodule in the lingula (series 4, image 85). No consolidation, pleural effusion, or pneumothorax. Musculoskeletal: No acute or significant osseous findings. Degenerative changes of the thoracic spine. CT ABDOMEN PELVIS FINDINGS Hepatobiliary: No focal liver abnormality is seen. No gallstones, gallbladder wall thickening, or biliary dilatation. Pancreas: Unremarkable. No pancreatic ductal dilatation or surrounding inflammatory changes. Spleen: Normal in size without focal abnormality. Adrenals/Urinary Tract: Adrenal glands are unremarkable. Kidneys are normal, without renal calculi, focal lesion, or hydronephrosis. Bladder is prominently distended. Stomach/Bowel: Small hiatal hernia. The stomach is otherwise within normal limits. There are a few loops of mildly distended small bowel in the left abdomen. There is no evidence of obstruction. Oral contrast reaches the cecum. Moderate colonic stool burden. Normal appendix. Vascular/Lymphatic: Aortic atherosclerosis. No enlarged abdominal or pelvic lymph nodes. Reproductive: Uterus and bilateral adnexa are unremarkable. Other: No free  fluid or pneumoperitoneum. Musculoskeletal: No acute or significant osseous findings. Dextroscoliosis and degenerative changes of the lower spine. Grade 1 anterolisthesis of L4 on L5. Small amount of stranding and a few foci of subcutaneous air along the right anterolateral lower abdominal wall. Correlate for recent injection. IMPRESSION: 1.  No acute intrathoracic process. 2. Diffuse peribronchial thickening and mild upper lobe centrilobular emphysema, consistent with COPD. Emphysema (ICD10-J43.9). 3. 5 mm nodule in the lingula. No follow-up needed if patient is low-risk. Non-contrast chest CT can be considered in 12 months if patient is high-risk. This recommendation follows the consensus statement: Guidelines for Management of Incidental Pulmonary Nodules Detected on CT Images: From the Fleischner Society 2017; Radiology 2017; 284:228-243. 4.  No acute intra-abdominal process. 5. There are a few loops of mildly distended small bowel in  the left abdomen, which may reflect dysmotility. No evidence of obstruction. 6.  Prominent stool throughout the colon favors constipation. 7.  Aortic atherosclerosis (ICD10-I70.0). Electronically Signed   By: Obie DredgeWilliam T Derry M.D.   On: 05/29/2017 12:26   Ct Abdomen Pelvis W Contrast  Result Date: 05/29/2017 CLINICAL DATA:  Cough for the past 2 weeks.  Hyponatremia. EXAM: CT CHEST, ABDOMEN, AND PELVIS WITH CONTRAST TECHNIQUE: Multidetector CT imaging of the chest, abdomen and pelvis was performed following the standard protocol during bolus administration of intravenous contrast. CONTRAST:  100mL ISOVUE-300 IOPAMIDOL (ISOVUE-300) INJECTION 61%, 30mL ISOVUE-300 IOPAMIDOL (ISOVUE-300) INJECTION 61% COMPARISON:  Chest x-ray from yesterday. FINDINGS: CT CHEST FINDINGS Cardiovascular: No significant vascular findings. Normal heart size. No pericardial effusion. Normal caliber thoracic aorta. Aortic atherosclerotic vascular calcifications. Mediastinum/Nodes: No enlarged mediastinal,  hilar, or axillary lymph nodes. Thyroid gland, trachea, and esophagus demonstrate no significant findings. Lungs/Pleura: Mild upper lobe centrilobular emphysema. Diffuse peribronchial thickening. Scattered areas of scarring. Bibasilar subsegmental atelectasis. 5 mm solid nodule in the lingula (series 4, image 85). No consolidation, pleural effusion, or pneumothorax. Musculoskeletal: No acute or significant osseous findings. Degenerative changes of the thoracic spine. CT ABDOMEN PELVIS FINDINGS Hepatobiliary: No focal liver abnormality is seen. No gallstones, gallbladder wall thickening, or biliary dilatation. Pancreas: Unremarkable. No pancreatic ductal dilatation or surrounding inflammatory changes. Spleen: Normal in size without focal abnormality. Adrenals/Urinary Tract: Adrenal glands are unremarkable. Kidneys are normal, without renal calculi, focal lesion, or hydronephrosis. Bladder is prominently distended. Stomach/Bowel: Small hiatal hernia. The stomach is otherwise within normal limits. There are a few loops of mildly distended small bowel in the left abdomen. There is no evidence of obstruction. Oral contrast reaches the cecum. Moderate colonic stool burden. Normal appendix. Vascular/Lymphatic: Aortic atherosclerosis. No enlarged abdominal or pelvic lymph nodes. Reproductive: Uterus and bilateral adnexa are unremarkable. Other: No free fluid or pneumoperitoneum. Musculoskeletal: No acute or significant osseous findings. Dextroscoliosis and degenerative changes of the lower spine. Grade 1 anterolisthesis of L4 on L5. Small amount of stranding and a few foci of subcutaneous air along the right anterolateral lower abdominal wall. Correlate for recent injection. IMPRESSION: 1.  No acute intrathoracic process. 2. Diffuse peribronchial thickening and mild upper lobe centrilobular emphysema, consistent with COPD. Emphysema (ICD10-J43.9). 3. 5 mm nodule in the lingula. No follow-up needed if patient is low-risk.  Non-contrast chest CT can be considered in 12 months if patient is high-risk. This recommendation follows the consensus statement: Guidelines for Management of Incidental Pulmonary Nodules Detected on CT Images: From the Fleischner Society 2017; Radiology 2017; 284:228-243. 4.  No acute intra-abdominal process. 5. There are a few loops of mildly distended small bowel in the left abdomen, which may reflect dysmotility. No evidence of obstruction. 6.  Prominent stool throughout the colon favors constipation. 7.  Aortic atherosclerosis (ICD10-I70.0). Electronically Signed   By: Obie DredgeWilliam T Derry M.D.   On: 05/29/2017 12:26     Assessment & Plan:   COPD exacerbation (HCC) Recent flare now resolved  Smoking cessation encouraged.  Spirometry shows no significant obstruction w. Moderate restriction  CT chest showed emphysema  Return for full PFT .   Plan  Patient Instructions  Work on not smoking .  Mucinex DM Twice daily As needed  Cough/congestion  CT chest 05/2018 to follow lung nodule  Can discuss with Primary MD that Lisinopril may aggravate cough if it returns.  Follow up with Dr. Isaiah SergeMannam in 3 months with PFT and  As needed  Solitary pulmonary nodule on lung CT Plan  CT chest 05/2018 .       Rubye Oaks, NP 06/16/2017

## 2017-06-18 NOTE — Progress Notes (Signed)
Chart and office note reviewed in detail  > agree with a/p as outlined    

## 2017-07-06 ENCOUNTER — Other Ambulatory Visit: Payer: Self-pay | Admitting: Family Medicine

## 2017-07-06 NOTE — Telephone Encounter (Signed)
Okay to refill?  Thank you, last provider was historical so just wanted to check.

## 2017-07-06 NOTE — Telephone Encounter (Signed)
Ok

## 2017-07-14 ENCOUNTER — Encounter (HOSPITAL_BASED_OUTPATIENT_CLINIC_OR_DEPARTMENT_OTHER): Payer: Self-pay | Admitting: *Deleted

## 2017-07-14 ENCOUNTER — Inpatient Hospital Stay (HOSPITAL_BASED_OUTPATIENT_CLINIC_OR_DEPARTMENT_OTHER)
Admission: EM | Admit: 2017-07-14 | Discharge: 2017-07-16 | DRG: 175 | Disposition: A | Discharge status: Home / Self Care | Payer: Medicare Other | Attending: Internal Medicine | Admitting: Internal Medicine

## 2017-07-14 ENCOUNTER — Other Ambulatory Visit: Payer: Self-pay

## 2017-07-14 ENCOUNTER — Emergency Department (HOSPITAL_BASED_OUTPATIENT_CLINIC_OR_DEPARTMENT_OTHER): Payer: Medicare Other

## 2017-07-14 DIAGNOSIS — Z79899 Other long term (current) drug therapy: Secondary | ICD-10-CM

## 2017-07-14 DIAGNOSIS — K5900 Constipation, unspecified: Secondary | ICD-10-CM | POA: Diagnosis present

## 2017-07-14 DIAGNOSIS — R778 Other specified abnormalities of plasma proteins: Secondary | ICD-10-CM

## 2017-07-14 DIAGNOSIS — E785 Hyperlipidemia, unspecified: Secondary | ICD-10-CM | POA: Diagnosis present

## 2017-07-14 DIAGNOSIS — I2699 Other pulmonary embolism without acute cor pulmonale: Secondary | ICD-10-CM | POA: Diagnosis not present

## 2017-07-14 DIAGNOSIS — F419 Anxiety disorder, unspecified: Secondary | ICD-10-CM | POA: Diagnosis present

## 2017-07-14 DIAGNOSIS — F17210 Nicotine dependence, cigarettes, uncomplicated: Secondary | ICD-10-CM | POA: Diagnosis present

## 2017-07-14 DIAGNOSIS — J9621 Acute and chronic respiratory failure with hypoxia: Secondary | ICD-10-CM | POA: Diagnosis present

## 2017-07-14 DIAGNOSIS — E890 Postprocedural hypothyroidism: Secondary | ICD-10-CM | POA: Diagnosis present

## 2017-07-14 DIAGNOSIS — F4329 Adjustment disorder with other symptoms: Secondary | ICD-10-CM | POA: Diagnosis present

## 2017-07-14 DIAGNOSIS — Z803 Family history of malignant neoplasm of breast: Secondary | ICD-10-CM

## 2017-07-14 DIAGNOSIS — Z801 Family history of malignant neoplasm of trachea, bronchus and lung: Secondary | ICD-10-CM

## 2017-07-14 DIAGNOSIS — R7989 Other specified abnormal findings of blood chemistry: Secondary | ICD-10-CM

## 2017-07-14 DIAGNOSIS — J9601 Acute respiratory failure with hypoxia: Secondary | ICD-10-CM | POA: Diagnosis present

## 2017-07-14 DIAGNOSIS — E871 Hypo-osmolality and hyponatremia: Secondary | ICD-10-CM | POA: Diagnosis not present

## 2017-07-14 DIAGNOSIS — K219 Gastro-esophageal reflux disease without esophagitis: Secondary | ICD-10-CM | POA: Diagnosis present

## 2017-07-14 DIAGNOSIS — J9620 Acute and chronic respiratory failure, unspecified whether with hypoxia or hypercapnia: Secondary | ICD-10-CM | POA: Diagnosis present

## 2017-07-14 DIAGNOSIS — J441 Chronic obstructive pulmonary disease with (acute) exacerbation: Secondary | ICD-10-CM | POA: Diagnosis present

## 2017-07-14 DIAGNOSIS — D649 Anemia, unspecified: Secondary | ICD-10-CM | POA: Diagnosis present

## 2017-07-14 DIAGNOSIS — I248 Other forms of acute ischemic heart disease: Secondary | ICD-10-CM | POA: Diagnosis present

## 2017-07-14 DIAGNOSIS — I10 Essential (primary) hypertension: Secondary | ICD-10-CM | POA: Diagnosis present

## 2017-07-14 LAB — CBC
HCT: 32.5 % — ABNORMAL LOW (ref 36.0–46.0)
HEMATOCRIT: 34.5 % — AB (ref 36.0–46.0)
HEMOGLOBIN: 11.8 g/dL — AB (ref 12.0–15.0)
HEMOGLOBIN: 11 g/dL — AB (ref 12.0–15.0)
MCH: 31.3 pg (ref 26.0–34.0)
MCH: 31.1 pg (ref 26.0–34.0)
MCHC: 34.2 g/dL (ref 30.0–36.0)
MCHC: 33.8 g/dL (ref 30.0–36.0)
MCV: 91.5 fL (ref 78.0–100.0)
MCV: 91.8 fL (ref 78.0–100.0)
Platelets: 179 10*3/uL (ref 150–400)
Platelets: 197 10*3/uL (ref 150–400)
RBC: 3.77 MIL/uL — ABNORMAL LOW (ref 3.87–5.11)
RBC: 3.54 MIL/uL — AB (ref 3.87–5.11)
RDW: 12.6 % (ref 11.5–15.5)
RDW: 12.8 % (ref 11.5–15.5)
WBC: 9.5 10*3/uL (ref 4.0–10.5)
WBC: 10.2 10*3/uL (ref 4.0–10.5)

## 2017-07-14 LAB — RESPIRATORY PANEL BY PCR
ADENOVIRUS-RVPPCR: NOT DETECTED
Bordetella pertussis: NOT DETECTED
CORONAVIRUS HKU1-RVPPCR: NOT DETECTED
CORONAVIRUS NL63-RVPPCR: NOT DETECTED
CORONAVIRUS OC43-RVPPCR: NOT DETECTED
Chlamydophila pneumoniae: NOT DETECTED
Coronavirus 229E: NOT DETECTED
INFLUENZA A-RVPPCR: NOT DETECTED
Influenza B: NOT DETECTED
MYCOPLASMA PNEUMONIAE-RVPPCR: NOT DETECTED
Metapneumovirus: NOT DETECTED
PARAINFLUENZA VIRUS 1-RVPPCR: NOT DETECTED
PARAINFLUENZA VIRUS 3-RVPPCR: NOT DETECTED
PARAINFLUENZA VIRUS 4-RVPPCR: NOT DETECTED
Parainfluenza Virus 2: NOT DETECTED
Respiratory Syncytial Virus: NOT DETECTED
Rhinovirus / Enterovirus: NOT DETECTED

## 2017-07-14 LAB — URINALYSIS, ROUTINE W REFLEX MICROSCOPIC
Bilirubin Urine: NEGATIVE
GLUCOSE, UA: NEGATIVE mg/dL
HGB URINE DIPSTICK: NEGATIVE
Ketones, ur: NEGATIVE mg/dL
Leukocytes, UA: NEGATIVE
Nitrite: NEGATIVE
PH: 5 (ref 5.0–8.0)
PROTEIN: NEGATIVE mg/dL
Specific Gravity, Urine: 1.005 (ref 1.005–1.030)

## 2017-07-14 LAB — BASIC METABOLIC PANEL
ANION GAP: 10 (ref 5–15)
BUN: 10 mg/dL (ref 6–20)
CO2: 24 mmol/L (ref 22–32)
Calcium: 9.3 mg/dL (ref 8.9–10.3)
Chloride: 91 mmol/L — ABNORMAL LOW (ref 101–111)
Creatinine, Ser: 0.63 mg/dL (ref 0.44–1.00)
Glucose, Bld: 97 mg/dL (ref 65–99)
POTASSIUM: 4.1 mmol/L (ref 3.5–5.1)
SODIUM: 125 mmol/L — AB (ref 135–145)

## 2017-07-14 LAB — TROPONIN I
TROPONIN I: 0.04 ng/mL — AB (ref <0.03)
TROPONIN I: 0.04 ng/mL — AB (ref <0.03)

## 2017-07-14 LAB — D-DIMER, QUANTITATIVE (NOT AT ARMC): D DIMER QUANT: 0.74 ug{FEU}/mL — AB (ref 0.00–0.50)

## 2017-07-14 MED ORDER — DOCUSATE SODIUM 100 MG PO CAPS
100.0000 mg | ORAL_CAPSULE | Freq: Three times a day (TID) | ORAL | Status: DC
  Administered 2017-07-14 – 2017-07-16 (×5): 100 mg via ORAL
  Filled 2017-07-14 (×5): qty 1

## 2017-07-14 MED ORDER — ALBUTEROL SULFATE (2.5 MG/3ML) 0.083% IN NEBU: 2.5000 mg | INHALATION_SOLUTION | RESPIRATORY_TRACT | Status: DC | PRN

## 2017-07-14 MED ORDER — ONDANSETRON HCL 4 MG/2ML IJ SOLN: 4.0000 mg | Freq: Four times a day (QID) | INTRAMUSCULAR | Status: DC | PRN

## 2017-07-14 MED ORDER — LISINOPRIL 20 MG PO TABS
40.0000 mg | ORAL_TABLET | Freq: Every day | ORAL | Status: DC
  Administered 2017-07-14 – 2017-07-15 (×2): 40 mg via ORAL
  Filled 2017-07-14 (×2): qty 2

## 2017-07-14 MED ORDER — PANTOPRAZOLE SODIUM 40 MG PO TBEC
40.0000 mg | DELAYED_RELEASE_TABLET | Freq: Every day | ORAL | Status: DC
  Administered 2017-07-15: 40 mg via ORAL
  Filled 2017-07-14: qty 1

## 2017-07-14 MED ORDER — AZITHROMYCIN 250 MG PO TABS
500.0000 mg | ORAL_TABLET | Freq: Once | ORAL | Status: AC
  Administered 2017-07-14: 500 mg via ORAL
  Filled 2017-07-14: qty 2

## 2017-07-14 MED ORDER — DEXTROSE 5 % IV SOLN
1.0000 g | Freq: Three times a day (TID) | INTRAVENOUS | Status: DC
  Administered 2017-07-14 – 2017-07-16 (×5): 1 g via INTRAVENOUS
  Filled 2017-07-14 (×6): qty 1

## 2017-07-14 MED ORDER — MIRABEGRON ER 25 MG PO TB24
50.0000 mg | ORAL_TABLET | Freq: Every day | ORAL | Status: DC
  Administered 2017-07-14 – 2017-07-15 (×2): 50 mg via ORAL
  Filled 2017-07-14 (×2): qty 2

## 2017-07-14 MED ORDER — ALBUTEROL SULFATE (2.5 MG/3ML) 0.083% IN NEBU
2.5000 mg | INHALATION_SOLUTION | Freq: Once | RESPIRATORY_TRACT | Status: AC
  Administered 2017-07-14: 2.5 mg via RESPIRATORY_TRACT
  Filled 2017-07-14: qty 3

## 2017-07-14 MED ORDER — DIAZEPAM 5 MG PO TABS
2.5000 mg | ORAL_TABLET | Freq: Every day | ORAL | Status: DC
  Administered 2017-07-15 – 2017-07-16 (×2): 2.5 mg via ORAL
  Filled 2017-07-14 (×2): qty 1

## 2017-07-14 MED ORDER — SODIUM CHLORIDE 0.9 % IV BOLUS (SEPSIS)
500.0000 mL | Freq: Once | INTRAVENOUS | Status: AC
Start: 2017-07-14 — End: 2017-07-14
  Administered 2017-07-14: 500 mL via INTRAVENOUS

## 2017-07-14 MED ORDER — ATORVASTATIN CALCIUM 40 MG PO TABS
40.0000 mg | ORAL_TABLET | Freq: Every day | ORAL | Status: DC
  Administered 2017-07-15: 40 mg via ORAL
  Filled 2017-07-14: qty 1

## 2017-07-14 MED ORDER — ALBUTEROL SULFATE (2.5 MG/3ML) 0.083% IN NEBU: 2.5000 mg | INHALATION_SOLUTION | RESPIRATORY_TRACT | Status: DC

## 2017-07-14 MED ORDER — ALBUTEROL SULFATE (2.5 MG/3ML) 0.083% IN NEBU
INHALATION_SOLUTION | RESPIRATORY_TRACT | Status: AC
  Administered 2017-07-14: 2.5 mg
  Filled 2017-07-14: qty 3

## 2017-07-14 MED ORDER — LINACLOTIDE 145 MCG PO CAPS
290.0000 ug | ORAL_CAPSULE | Freq: Every day | ORAL | Status: DC
  Administered 2017-07-14 – 2017-07-15 (×2): 290 ug via ORAL
  Filled 2017-07-14 (×2): qty 2

## 2017-07-14 MED ORDER — ACETAMINOPHEN 325 MG PO TABS: 650.0000 mg | ORAL_TABLET | Freq: Four times a day (QID) | ORAL | Status: DC | PRN

## 2017-07-14 MED ORDER — DIAZEPAM 5 MG PO TABS
5.0000 mg | ORAL_TABLET | Freq: Two times a day (BID) | ORAL | Status: DC
  Administered 2017-07-15 – 2017-07-16 (×4): 5 mg via ORAL
  Filled 2017-07-14 (×4): qty 1

## 2017-07-14 MED ORDER — AZITHROMYCIN 250 MG PO TABS: 250.0000 mg | ORAL_TABLET | Freq: Every day | ORAL | Status: DC

## 2017-07-14 MED ORDER — OXCARBAZEPINE 150 MG PO TABS
450.0000 mg | ORAL_TABLET | Freq: Every day | ORAL | Status: DC
  Administered 2017-07-14 – 2017-07-15 (×2): 450 mg via ORAL
  Filled 2017-07-14 (×2): qty 1

## 2017-07-14 MED ORDER — ACETAMINOPHEN 650 MG RE SUPP: 650.0000 mg | Freq: Four times a day (QID) | RECTAL | Status: DC | PRN

## 2017-07-14 MED ORDER — MIRTAZAPINE 15 MG PO TABS
45.0000 mg | ORAL_TABLET | Freq: Every day | ORAL | Status: DC
  Administered 2017-07-14 – 2017-07-15 (×2): 45 mg via ORAL
  Filled 2017-07-14 (×2): qty 3

## 2017-07-14 MED ORDER — IPRATROPIUM BROMIDE 0.02 % IN SOLN: 0.5000 mg | RESPIRATORY_TRACT | Status: DC

## 2017-07-14 MED ORDER — VANCOMYCIN HCL 10 G IV SOLR
1250.0000 mg | Freq: Once | INTRAVENOUS | Status: AC
  Administered 2017-07-15: 1250 mg via INTRAVENOUS
  Filled 2017-07-14: qty 1250

## 2017-07-14 MED ORDER — DEXTROSE 5 % IV SOLN
500.0000 mg | INTRAVENOUS | Status: DC
  Administered 2017-07-15: 500 mg via INTRAVENOUS
  Filled 2017-07-14: qty 500

## 2017-07-14 MED ORDER — IPRATROPIUM-ALBUTEROL 0.5-2.5 (3) MG/3ML IN SOLN
RESPIRATORY_TRACT | Status: AC
  Administered 2017-07-14: 3 mL
  Filled 2017-07-14: qty 3

## 2017-07-14 MED ORDER — ONDANSETRON HCL 4 MG PO TABS: 4.0000 mg | ORAL_TABLET | Freq: Four times a day (QID) | ORAL | Status: DC | PRN

## 2017-07-14 MED ORDER — ENOXAPARIN SODIUM 40 MG/0.4ML ~~LOC~~ SOLN
40.0000 mg | Freq: Every day | SUBCUTANEOUS | Status: DC
  Filled 2017-07-14: qty 0.4

## 2017-07-14 MED ORDER — GABAPENTIN 300 MG PO CAPS
300.0000 mg | ORAL_CAPSULE | Freq: Three times a day (TID) | ORAL | Status: DC
  Administered 2017-07-14 – 2017-07-16 (×5): 300 mg via ORAL
  Filled 2017-07-14 (×5): qty 1

## 2017-07-14 MED ORDER — LEVOTHYROXINE SODIUM 100 MCG PO TABS
100.0000 ug | ORAL_TABLET | Freq: Every day | ORAL | Status: DC
  Administered 2017-07-14 – 2017-07-15 (×2): 100 ug via ORAL
  Filled 2017-07-14 (×2): qty 1

## 2017-07-14 MED ORDER — HYDROCOD POLST-CPM POLST ER 10-8 MG/5ML PO SUER
5.0000 mL | Freq: Once | ORAL | Status: AC
  Administered 2017-07-14: 5 mL via ORAL
  Filled 2017-07-14: qty 5

## 2017-07-14 MED ORDER — HYDROCOD POLST-CPM POLST ER 10-8 MG/5ML PO SUER
5.0000 mL | Freq: Two times a day (BID) | ORAL | Status: DC | PRN
  Administered 2017-07-14 – 2017-07-16 (×3): 5 mL via ORAL
  Filled 2017-07-14 (×3): qty 5

## 2017-07-14 MED ORDER — IPRATROPIUM-ALBUTEROL 0.5-2.5 (3) MG/3ML IN SOLN
3.0000 mL | Freq: Four times a day (QID) | RESPIRATORY_TRACT | Status: DC
  Administered 2017-07-14: 3 mL via RESPIRATORY_TRACT

## 2017-07-14 MED ORDER — IPRATROPIUM-ALBUTEROL 0.5-2.5 (3) MG/3ML IN SOLN
3.0000 mL | RESPIRATORY_TRACT | Status: DC
  Administered 2017-07-15: 3 mL via RESPIRATORY_TRACT
  Filled 2017-07-14: qty 3

## 2017-07-14 MED ORDER — IPRATROPIUM-ALBUTEROL 0.5-2.5 (3) MG/3ML IN SOLN
3.0000 mL | Freq: Once | RESPIRATORY_TRACT | Status: AC
  Administered 2017-07-14: 3 mL via RESPIRATORY_TRACT
  Filled 2017-07-14: qty 3

## 2017-07-14 MED ORDER — DEXAMETHASONE SODIUM PHOSPHATE 10 MG/ML IJ SOLN
10.0000 mg | Freq: Once | INTRAMUSCULAR | Status: AC
  Administered 2017-07-14: 10 mg via INTRAVENOUS
  Filled 2017-07-14: qty 1

## 2017-07-14 NOTE — ED Provider Notes (Signed)
[facility] EMERGENCY DEPARTMENT Provider Note   CSN: [id number] Arrival date & time: 07/14/17  1117     History   Chief Complaint Chief Complaint  Patient presents with  . Cough    HPI Brenda Dunn is a 66 y.o. female presenting with cough and shortness of breath.  Patient states that for the past 4 days, she has had persistent dry cough and shortness of breath.  She has been using her at home nebulizer every couple hours with short-term mild improvement.  She describes a feeling of mucus being stuck at the base of her throat, but she is unable to cough it up.  Shortness of breath is constant, worse with exertion.  She has a history of COPD, but does not take any daily inhalers.  She denies fevers, chills, sore throat, chest pain, nausea, vomiting, abdominal pain, urinary symptoms, abnormal bowel movements, or leg pain or swelling.  She smokes and vapes daily.  She was recently on a cruise, returned December 6.  Symptoms started several days later.  HPI  Past Medical History:  Diagnosis Date  . Anxiety   . Benzodiazepine dependence (HCC)   . Colonic polyp    mulitple polyps. repeat in 05/2019  . Constipation    IBS  . COPD (chronic obstructive pulmonary disease) (HCC)   . Depression   . GERD (gastroesophageal reflux disease)   . Hyperlipidemia   . Hypertension   . Insomnia   . Migraine headache   . OAB (overactive bladder)   . Osteoarthritis   . Post-surgical hypothyroidism   . Scoliosis     Patient Active Problem List   Diagnosis Date Noted  . Solitary pulmonary nodule on lung CT 06/02/2017  . COPD exacerbation (HCC) 05/28/2017  . Acute on chronic respiratory failure (HCC) 05/28/2017  . Hypo-osmolality and hyponatremia 05/18/2017  . Smoker 05/02/2017  . Essential hypertension   . GERD (gastroesophageal reflux disease)   . Anxiety   . Post-surgical hypothyroidism   . Hyperlipidemia   . Osteoarthritis   . Depression   . Constipation   . Scoliosis    . Migraine headache   . Benzodiazepine dependence (HCC)   . COPD pfts pending    . Insomnia   . OAB (overactive bladder)   . Colonic polyp     Past Surgical History:  Procedure Laterality Date  . HEMORRHOID SURGERY    . TH[facility]ROIDECTOMY     at [facility]  . TUBAL LIGATION  1979    OB History    No data available       Home Medications    Prior to Admission medications   Medication Sig Start Date End Date Taking? Authorizing Provider  albuterol (PROVENTIL HFA;VENTOLIN HFA) 108 (90 Base) MCG/ACT inhaler Inhale 2 puffs into the lungs every 4 (four) hours as needed for wheezing or shortness of breath. Patient not taking: Reported on 06/16/2017 05/13/17   Arthor Captain, PA-C  atorvastatin (LIPITOR) 40 MG tablet Take 40 mg by mouth daily.    [provider]  diazepam (VALIUM) 5 MG tablet Take 5 mg by mouth. Take 1 tablet in the am, 1/2 tablet at lunch and 1 tablet at night    [provider]  Docusate Calcium (STOOL SOFTENER PO) Take 3 tablets by mouth daily.    [provider]  esomeprazole (NEXIUM) 40 MG capsule Take 1 capsule (40 mg total) by mouth daily. 05/02/17   Henson, [name], [name]  gabapentin (NEURONTIN) 300 MG capsule  Take 300 mg by mouth 3 (three) times daily.    [provider]  ipratropium-albuterol (DUONEB) 0.5-2.5 (3) MG/3ML SOLN Take 3 mLs by nebulization 3 (three) times daily. Patient not taking: Reported on 06/16/2017 05/30/17   Kathlen ModyAkula, Vijaya, MD  levothyroxine (SYNTHROID, LEVOTHROID) 100 MCG tablet Take 100 mcg by mouth daily before breakfast.    [provider]  LINZESS 290 MCG CAPS capsule TAKE 1 CAPSULE BY MOUTH ONCE A DAY 06/12/17   Henson, [name], [name]  lisinopril (PRINIVIL,ZESTRIL) 40 MG tablet Take 40 mg by mouth daily.    [provider]  MYRBETRIQ 50 MG TB24 tablet TAKE 1 TABLET BY MOUTH EVERY DAY 07/06/17   Henson, [name], [name]  OXcarbazepine (TRILEPTAL) 150 MG tablet Take 450 mg by mouth at bedtime.     [provider]  Tiotropium Bromide-Olodaterol (STIOLTO RESPIMAT) 2.5-2.5 MCG/ACT AERS Inhale 2 puffs into the lungs daily. Patient not taking: Reported on 06/16/2017 05/26/17   Avanell ShackletonHenson, [name], [name]    Family History Family History  Problem Relation Age of Onset  . Breast cancer Mother   . Lung cancer Maternal Uncle   . Lung cancer Maternal Grandmother     Social History Social History   Tobacco Use  . Smoking status: Current Every Day Smoker    Packs/day: 1.50    Years: 44.00    Pack years: 66.00  . Smokeless tobacco: Never Used  Substance Use Topics  . Alcohol use: No  . Drug use: No     Allergies   Patient has no known allergies.   Review of Systems Review of Systems  Respiratory: Positive for cough, chest tightness, shortness of breath and wheezing.   All other systems reviewed and are negative.    Physical Exam Updated Vital Signs BP 139/73   Pulse 100   Temp 99 F (37.2 C) (Oral)   Resp 13   Ht 4\' 11"  (1.499 m)   Wt 59.9 kg (132 lb)   SpO2 92%   BMI 26.66 kg/m   Physical Exam  Constitutional: She is oriented to person, place, and time. She appears well-developed and well-nourished.  HENT:  Head: Normocephalic and atraumatic.  Right Ear: Tympanic membrane, external ear and ear canal normal.  Left Ear: Tympanic membrane, external ear and ear canal normal.  Nose: Nose normal.  Mouth/Throat: Uvula is midline, oropharynx is clear and moist and mucous membranes are normal.  Eyes: EOM are normal.  Neck: Normal range of motion.  Cardiovascular: Normal rate, regular rhythm and intact distal pulses.  Pulmonary/Chest: No respiratory distress. She has wheezes.  Patient with increased of breathing, taking a breath every couple words.  Wheezing heard in all fields.  Abdominal: Soft. She exhibits no distension. There is no tenderness.  Musculoskeletal: Normal range of motion.  No leg pain or swelling  Lymphadenopathy:    She has no cervical  adenopathy.  Neurological: She is alert and oriented to person, place, and time.  Skin: Skin is warm and dry.  Psychiatric: She has a normal mood and affect.  Nursing note and vitals reviewed.    ED Treatments / Results  Labs (all labs ordered are listed, but only abnormal results are displayed) Labs Reviewed  BASIC METABOLIC PANEL - Abnormal; Notable for the following components:      Result Value   Sodium 125 (*)    Chloride 91 (*)    All other components within normal limits  CBC - Abnormal; Notable for the following components:  RBC 3.77 (*)    Hemoglobin 11.8 (*)    HCT 34.5 (*)    All other components within normal limits  TROPONIN I - Abnormal; Notable for the following components:   Troponin I 0.04 (*)    All other components within normal limits  RESPIRATORY PANEL BY PCR  URINALYSIS, ROUTINE W REFLEX MICROSCOPIC  TROPONIN I    EKG  EKG Interpretation  Date/Time:  Friday July 14 2017 11:52:48 EST Ventricular Rate:  86 PR Interval:    QRS Duration: 95 QT Interval:  384 QTC Calculation: 460 R Axis:   -96 Text Interpretation:  Sinus rhythm Left anterior fascicular block Low voltage, precordial leads Abnormal R-wave progression, late transition No significant change since last tracing Confirmed by Linwood Dibbles [phone]) on 07/14/2017 11:56:30 AM       Radiology Dg Chest 2 View  Result Date: 07/14/2017 CLINICAL DATA:  Shortness of breath.  Chest tightness. EXAM: CHEST  2 VIEW COMPARISON:  CT 05/29/2017.  Chest x-ray 05/28/2017. FINDINGS: Mediastinum and hilar structures normal. Cardiomegaly with normal pulmonary vascularity. Mild bilateral increased interstitial markings. Mild pneumonitis cannot be excluded No focal infiltrate. No pleural effusion or pneumothorax. Degenerative changes thoracic spine. IMPRESSION: Mild bilateral increased interstitial markings. Mild pneumonitis cannot be excluded. Electronically Signed   By: Maisie Fus  Register   On: 07/14/2017 12:59      Procedures Procedures (including critical care time)  Medications Ordered in ED Medications  azithromycin (ZITHROMAX) tablet 250 mg (not administered)  albuterol (PROVENTIL) (2.5 MG/3ML) 0.083% nebulizer solution (2.5 mg  Given 07/14/17 1146)  ipratropium-albuterol (DUONEB) 0.5-2.5 (3) MG/3ML nebulizer solution (3 mLs  Given 07/14/17 1146)  ipratropium-albuterol (DUONEB) 0.5-2.5 (3) MG/3ML nebulizer solution 3 mL (3 mLs Nebulization Given 07/14/17 1251)  dexamethasone (DECADRON) injection 10 mg (10 mg Intravenous Given 07/14/17 1428)  sodium chloride 0.9 % bolus 500 mL (0 mLs Intravenous Stopped 07/14/17 1502)  chlorpheniramine-HYDROcodone (TUSSIONEX) 10-8 MG/5ML suspension 5 mL (5 mLs Oral Given 07/14/17 1600)  azithromycin (ZITHROMAX) tablet 500 mg (500 mg Oral Given 07/14/17 1601)  ipratropium-albuterol (DUONEB) 0.5-2.5 (3) MG/3ML nebulizer solution 3 mL (3 mLs Nebulization Given 07/14/17 1625)  albuterol (PROVENTIL) (2.5 MG/3ML) 0.083% nebulizer solution 2.5 mg (2.5 mg Nebulization Given 07/14/17 1625)     Initial Impression / Assessment and Plan / ED Course  I have reviewed the triage vital signs and the nursing notes.  Pertinent labs & imaging results that were available during my care of the patient were reviewed by me and considered in my medical decision making (see chart for details).     Patient presented with several day history of shortness of breath and cough.  Mildly relieved with nebulizer, but returns quickly.  Physical exam shows patient who has increase in work of breathing and wheezing throughout even after breathing treatment.  Physical exam otherwise reassuring.  Doubt PE.  Will obtain x-ray to rule out pneumonia.  Basic labs obtained for further evaluation.  Another breathing treatment given.  On reassessment, lung sounds are much improved, but the patient reports she is still feeling short of breath and her chest is tight.  Her oxygen saturations vary between  89 and 93.  Not on oxygen at home.  X-ray negative for infiltrate.  Troponin with slight increase at 0.04.  Likely due to respiratory exertion, doubt ACS. EKG reassuring. BMP shows hyponatremia, this has been present in the past.  Hemoglobin stable.  Will give Decadron for chest tightness, gentle IV fluids, and ambulate on pulse ox. Case  discussed with attending, Dr. Lynelle DoctorKnapp evaluated the pt.   While ambulating on pulse ox, SPO2 decreased to 88% afterwards.  Will consult with hospitalist for admission for COPD exacerbation and troponin trending.  Case discussed with hospitalist, patient to be admitted. Requests Respiratory panel and UA. On reassessment, patient reports shortness of breath and tightness improved but did not completely resolved with Decadron.  She has continued cough.  Tussionex given.  Wheezing returned, will give another breathing treatment.  Final Clinical Impressions(s) / ED Diagnoses   Final diagnoses:  COPD exacerbation (HCC)  Hyponatremia  Elevated troponin    ED Discharge Orders    None       Alveria ApleyCaccavale, Venie Montesinos, PA-C 07/14/17 1652    Linwood DibblesKnapp, Jon, MD 07/15/17 [phone]

## 2017-07-14 NOTE — ED Triage Notes (Signed)
Sob and cough x 4 days.

## 2017-07-14 NOTE — Progress Notes (Signed)
Pharmacy Antibiotic Note  Brenda Dunn is a 66 y.o. female with cough and SOB admitted on 07/14/2017 with pneumonia.  Pharmacy has been consulted for vancomycin dosing.  Plan: Vancomycin 1250 mg x1 then 500 mg IV q24h for est AUC=437 Goal AUC = 400-500 Cefepime 1 Gm IV q8h (MD) Zmax 500 mg IV q24h (MD)  Height: 4\' 11"  (149.9 cm) Weight: 132 lb (59.9 kg) IBW/kg (Calculated) : 43.2  Temp (24hrs), Avg:98.6 F (37 C), Min:98.2 F (36.8 C), Max:99 F (37.2 C)  Recent Labs  Lab 07/14/17 1201  WBC 9.5  CREATININE 0.63    Estimated Creatinine Clearance: 55.2 mL/min (by C-G formula based on SCr of 0.63 mg/dL).    No Known Allergies  Antimicrobials this admission: 12/14 cefepime  >>  12/14  zmax  >>  12/14 vancomycin >>  Dose adjustments this admission:   Microbiology results:  BCx:   UCx:    Sputum:    MRSA PCR:   Thank you for allowing pharmacy to be a part of this patient's care.  Lorenza EvangelistGreen, Anaisa Radi R 07/14/2017 10:33 PM

## 2017-07-14 NOTE — Progress Notes (Addendum)
Brenda Dunn, Brenda Dunn Female, 66 y.o., [birth year]  H/o copd, not on home oxygen not on home oxygen, [facility] due to cough/ short of breath for 4 days.  ED course: No fever, no hypoxia , blood pressure stable.  She does have significant dyspnea, diffuse wheezing, respiration rate in the 20s, initially she was not able to finish full sentences. Chest x-ray no acute infiltrate , labs no leukocytosis, as has hyponatremia, sodium 125.  she received nebulizer, steroid.  EDP request admit.    I request EDP to add on flu test, respiratory viral  panel . Start  Z-Pak.  MedSurg obs.  Please call low meld up on patient's arrival.

## 2017-07-14 NOTE — ED Notes (Signed)
Ambulated in hall on room air, HR 100-110, RR 24-28, SpO2 93-95%, denies +DOE, dry congested cough remains.

## 2017-07-14 NOTE — ED Notes (Signed)
SpO2 88% on monitored once back in room.

## 2017-07-14 NOTE — ED Notes (Signed)
RT in to access.

## 2017-07-14 NOTE — Progress Notes (Addendum)
Up to the bathroom to void, developed coughing & s.o.b , Dr Toniann FailKakrakandy called and will be her in 1 hour

## 2017-07-15 ENCOUNTER — Observation Stay (HOSPITAL_BASED_OUTPATIENT_CLINIC_OR_DEPARTMENT_OTHER): Payer: Medicare Other

## 2017-07-15 ENCOUNTER — Encounter (HOSPITAL_COMMUNITY): Payer: Self-pay | Admitting: Radiology

## 2017-07-15 ENCOUNTER — Observation Stay (HOSPITAL_COMMUNITY): Payer: Medicare Other

## 2017-07-15 DIAGNOSIS — K5900 Constipation, unspecified: Secondary | ICD-10-CM | POA: Diagnosis present

## 2017-07-15 DIAGNOSIS — F17210 Nicotine dependence, cigarettes, uncomplicated: Secondary | ICD-10-CM | POA: Diagnosis present

## 2017-07-15 DIAGNOSIS — I10 Essential (primary) hypertension: Secondary | ICD-10-CM | POA: Diagnosis present

## 2017-07-15 DIAGNOSIS — E785 Hyperlipidemia, unspecified: Secondary | ICD-10-CM | POA: Diagnosis present

## 2017-07-15 DIAGNOSIS — J441 Chronic obstructive pulmonary disease with (acute) exacerbation: Secondary | ICD-10-CM | POA: Diagnosis present

## 2017-07-15 DIAGNOSIS — R748 Abnormal levels of other serum enzymes: Secondary | ICD-10-CM

## 2017-07-15 DIAGNOSIS — I248 Other forms of acute ischemic heart disease: Secondary | ICD-10-CM | POA: Diagnosis present

## 2017-07-15 DIAGNOSIS — R7989 Other specified abnormal findings of blood chemistry: Secondary | ICD-10-CM

## 2017-07-15 DIAGNOSIS — J9601 Acute respiratory failure with hypoxia: Secondary | ICD-10-CM | POA: Diagnosis not present

## 2017-07-15 DIAGNOSIS — E871 Hypo-osmolality and hyponatremia: Secondary | ICD-10-CM | POA: Diagnosis present

## 2017-07-15 DIAGNOSIS — Z801 Family history of malignant neoplasm of trachea, bronchus and lung: Secondary | ICD-10-CM | POA: Diagnosis not present

## 2017-07-15 DIAGNOSIS — D649 Anemia, unspecified: Secondary | ICD-10-CM | POA: Diagnosis present

## 2017-07-15 DIAGNOSIS — Z79899 Other long term (current) drug therapy: Secondary | ICD-10-CM | POA: Diagnosis not present

## 2017-07-15 DIAGNOSIS — F4329 Adjustment disorder with other symptoms: Secondary | ICD-10-CM | POA: Diagnosis present

## 2017-07-15 DIAGNOSIS — J9621 Acute and chronic respiratory failure with hypoxia: Secondary | ICD-10-CM | POA: Diagnosis present

## 2017-07-15 DIAGNOSIS — I509 Heart failure, unspecified: Secondary | ICD-10-CM

## 2017-07-15 DIAGNOSIS — R778 Other specified abnormalities of plasma proteins: Secondary | ICD-10-CM

## 2017-07-15 DIAGNOSIS — I2699 Other pulmonary embolism without acute cor pulmonale: Secondary | ICD-10-CM | POA: Diagnosis present

## 2017-07-15 DIAGNOSIS — Z803 Family history of malignant neoplasm of breast: Secondary | ICD-10-CM | POA: Diagnosis not present

## 2017-07-15 DIAGNOSIS — E890 Postprocedural hypothyroidism: Secondary | ICD-10-CM | POA: Diagnosis present

## 2017-07-15 DIAGNOSIS — F419 Anxiety disorder, unspecified: Secondary | ICD-10-CM | POA: Diagnosis present

## 2017-07-15 DIAGNOSIS — K219 Gastro-esophageal reflux disease without esophagitis: Secondary | ICD-10-CM | POA: Diagnosis present

## 2017-07-15 LAB — ECHOCARDIOGRAM COMPLETE
HEIGHTINCHES: 59 in
Weight: 2112 oz

## 2017-07-15 LAB — CBC
HEMATOCRIT: 30 % — AB (ref 36.0–46.0)
HEMOGLOBIN: 10 g/dL — AB (ref 12.0–15.0)
MCH: 30.9 pg (ref 26.0–34.0)
MCHC: 33.3 g/dL (ref 30.0–36.0)
MCV: 92.6 fL (ref 78.0–100.0)
Platelets: 212 10*3/uL (ref 150–400)
RBC: 3.24 MIL/uL — ABNORMAL LOW (ref 3.87–5.11)
RDW: 13 % (ref 11.5–15.5)
WBC: 9.8 10*3/uL (ref 4.0–10.5)

## 2017-07-15 LAB — BASIC METABOLIC PANEL
ANION GAP: 12 (ref 5–15)
ANION GAP: 9 (ref 5–15)
BUN: 9 mg/dL (ref 6–20)
BUN: 12 mg/dL (ref 6–20)
CALCIUM: 9 mg/dL (ref 8.9–10.3)
CHLORIDE: 93 mmol/L — AB (ref 101–111)
CO2: 22 mmol/L (ref 22–32)
CO2: 24 mmol/L (ref 22–32)
Calcium: 9.2 mg/dL (ref 8.9–10.3)
Chloride: 95 mmol/L — ABNORMAL LOW (ref 101–111)
Creatinine, Ser: 0.72 mg/dL (ref 0.44–1.00)
Creatinine, Ser: 0.64 mg/dL (ref 0.44–1.00)
GFR calc Af Amer: >60 mL/min (ref >60)
GFR calc non Af Amer: >60 mL/min (ref >60)
GFR calc non Af Amer: >60 mL/min (ref >60)
GLUCOSE: 102 mg/dL — AB (ref 65–99)
Glucose, Bld: 156 mg/dL — ABNORMAL HIGH (ref 65–99)
POTASSIUM: 3.7 mmol/L (ref 3.5–5.1)
Potassium: 4.5 mmol/L (ref 3.5–5.1)
Sodium: 127 mmol/L — ABNORMAL LOW (ref 135–145)
Sodium: 128 mmol/L — ABNORMAL LOW (ref 135–145)

## 2017-07-15 LAB — TROPONIN I
Troponin I: 0.03 ng/mL (ref <0.03)
Troponin I: <0.03 ng/mL (ref <0.03)
Troponin I: <0.03 ng/mL (ref <0.03)

## 2017-07-15 LAB — TSH: TSH: 0.439 u[IU]/mL (ref 0.350–4.500)

## 2017-07-15 MED ORDER — METHYLPREDNISOLONE SODIUM SUCC 40 MG IJ SOLR
40.0000 mg | Freq: Every day | INTRAMUSCULAR | Status: DC
  Administered 2017-07-15 – 2017-07-16 (×2): 40 mg via INTRAVENOUS
  Filled 2017-07-15 (×2): qty 1

## 2017-07-15 MED ORDER — FAMOTIDINE 20 MG PO TABS
20.0000 mg | ORAL_TABLET | Freq: Every day | ORAL | Status: DC
  Administered 2017-07-15 – 2017-07-16 (×2): 20 mg via ORAL
  Filled 2017-07-15 (×2): qty 1

## 2017-07-15 MED ORDER — VANCOMYCIN HCL 500 MG IV SOLR
500.0000 mg | INTRAVENOUS | Status: DC
  Administered 2017-07-15: 500 mg via INTRAVENOUS
  Filled 2017-07-15: qty 500

## 2017-07-15 MED ORDER — BUDESONIDE 0.25 MG/2ML IN SUSP
0.2500 mg | Freq: Two times a day (BID) | RESPIRATORY_TRACT | Status: DC
  Administered 2017-07-15 – 2017-07-16 (×3): 0.25 mg via RESPIRATORY_TRACT
  Filled 2017-07-15 (×3): qty 2

## 2017-07-15 MED ORDER — GUAIFENESIN-DM 100-10 MG/5ML PO SYRP
5.0000 mL | ORAL_SOLUTION | ORAL | Status: DC | PRN
  Administered 2017-07-15 – 2017-07-16 (×4): 5 mL via ORAL
  Filled 2017-07-15 (×4): qty 10

## 2017-07-15 MED ORDER — BENZONATATE 100 MG PO CAPS
100.0000 mg | ORAL_CAPSULE | Freq: Three times a day (TID) | ORAL | Status: DC
  Administered 2017-07-15 – 2017-07-16 (×4): 100 mg via ORAL
  Filled 2017-07-15 (×4): qty 1

## 2017-07-15 MED ORDER — ALUM & MAG HYDROXIDE-SIMETH 200-200-20 MG/5ML PO SUSP: 30.0000 mL | ORAL | Status: DC | PRN

## 2017-07-15 MED ORDER — IOPAMIDOL (ISOVUE-370) INJECTION 76%
100.0000 mL | Freq: Once | INTRAVENOUS | Status: AC | PRN
  Administered 2017-07-15: 100 mL via INTRAVENOUS

## 2017-07-15 MED ORDER — DM-GUAIFENESIN ER 30-600 MG PO TB12
1.0000 | ORAL_TABLET | Freq: Two times a day (BID) | ORAL | Status: DC
  Administered 2017-07-15 – 2017-07-16 (×3): 1 via ORAL
  Filled 2017-07-15 (×3): qty 1

## 2017-07-15 MED ORDER — IPRATROPIUM-ALBUTEROL 0.5-2.5 (3) MG/3ML IN SOLN
3.0000 mL | Freq: Three times a day (TID) | RESPIRATORY_TRACT | Status: DC
  Administered 2017-07-15 – 2017-07-16 (×4): 3 mL via RESPIRATORY_TRACT
  Filled 2017-07-15 (×5): qty 3

## 2017-07-15 MED ORDER — APIXABAN 5 MG PO TABS
10.0000 mg | ORAL_TABLET | Freq: Two times a day (BID) | ORAL | Status: DC
  Administered 2017-07-15 – 2017-07-16 (×3): 10 mg via ORAL
  Filled 2017-07-15 (×3): qty 2

## 2017-07-15 MED ORDER — APIXABAN 5 MG PO TABS: 5.0000 mg | ORAL_TABLET | Freq: Two times a day (BID) | ORAL | Status: DC

## 2017-07-15 MED ORDER — IOPAMIDOL (ISOVUE-370) INJECTION 76%
INTRAVENOUS | Status: AC
  Filled 2017-07-15: qty 100

## 2017-07-15 NOTE — Progress Notes (Signed)
Patient requesting medication for heartburn. Dr. Allena KatzPatel notified via text page.

## 2017-07-15 NOTE — H&P (Signed)
History and Physical    Brenda Dunn:[id number] DOB: Mar 19, 1951 DOA: 07/14/2017  PCP: Avanell Shackleton, [name]  Patient coming from: Home.  Chief Complaint: Cough and shortness of breath.  HPI: Brenda Dunn is a 66 y.o. female with COPD, hypertension, hyperlipidemia and tobacco abuse presents to the ER with complaints of persistent cough since December 11.  Patient had just come back from a cruise from [place] and released on December 6.  Patient states that during the cruise he did have some throat congestion.  Denies any fever chills chest pain nausea vomiting diarrhea fever or chills.  December 11 patient has been having persistent dry cough which has been progressively worsening with shortness of breath.  ED Course: In the ER patient had a chest x-ray which shows possible pneumonitis.  Was found to be wheezing which improved with nebulizer treatment and Decadron.  But since patient has been persistently coughing and short of breath admitted for further management.  At the time of my exam patient is still coughing but not in distress and is mildly short of breath.  Labs revealed mildly elevated troponin EKG shows normal sinus rhythm.  Review of Systems: As per HPI, rest all negative.   Past Medical History:  Diagnosis Date  . Anxiety   . Benzodiazepine dependence (HCC)   . Colonic polyp    mulitple polyps. repeat in 05/2019  . Constipation    IBS  . COPD (chronic obstructive pulmonary disease) (HCC)   . Depression   . GERD (gastroesophageal reflux disease)   . Hyperlipidemia   . Hypertension   . Insomnia   . Migraine headache   . OAB (overactive bladder)   . Osteoarthritis   . Post-surgical hypothyroidism   . Scoliosis     Past Surgical History:  Procedure Laterality Date  . HEMORRHOID SURGERY    . TH[facility]ROIDECTOMY     at [facility]  . TUBAL LIGATION  1979     reports that she has been smoking.  She has a 66.00 pack-year smoking history. she has never used smokeless  tobacco. She reports that she does not drink alcohol or use drugs.  No Known Allergies  Family History  Problem Relation Age of Onset  . Breast cancer Mother   . Lung cancer Maternal Uncle   . Lung cancer Maternal Grandmother     Prior to Admission medications   Medication Sig Start Date End Date Taking? Authorizing Provider  albuterol (PROVENTIL HFA;VENTOLIN HFA) 108 (90 Base) MCG/ACT inhaler Inhale 2 puffs into the lungs every 4 (four) hours as needed for wheezing or shortness of breath. 05/13/17  Yes Harris, Abigail, PA-C  atorvastatin (LIPITOR) 40 MG tablet Take 40 mg by mouth daily.   Yes [provider]  chlorpheniramine-HYDROcodone (TUSSIONEX) 10-8 MG/5ML SUER Take 5 mLs by mouth at bedtime. 06/01/17  Yes [provider]  diazepam (VALIUM) 5 MG tablet Take 5 mg by mouth. Take 1 tablet in the am, 1/2 tablet at lunch and 1 tablet at night   Yes [provider]  Docusate Calcium (STOOL SOFTENER PO) Take 3 tablets by mouth daily.   Yes [provider]  gabapentin (NEURONTIN) 300 MG capsule Take 300 mg by mouth 3 (three) times daily.   Yes [provider]  ipratropium-albuterol (DUONEB) 0.5-2.5 (3) MG/3ML SOLN Take 3 mLs by nebulization 3 (three) times daily. 05/30/17  Yes Kathlen Mody, MD  levothyroxine (SYNTHROID, LEVOTHROID) 100 MCG tablet Take 100 mcg by mouth daily before breakfast.  Yes [provider]  LINZESS 290 MCG CAPS capsule TAKE 1 CAPSULE BY MOUTH ONCE A DAY 06/12/17  Yes Henson, [name], [name]  lisinopril (PRINIVIL,ZESTRIL) 40 MG tablet Take 40 mg by mouth daily.   Yes [provider]  mirtazapine (REMERON) 45 MG tablet Take 45 mg by mouth at bedtime.   Yes [provider]  MYRBETRIQ 50 MG TB24 tablet TAKE 1 TABLET BY MOUTH EVERY DAY 07/06/17  Yes Henson, [name], [name]  omeprazole (PRILOSEC) 40 MG capsule Take 40 mg by mouth daily.   Yes [provider]  OXcarbazepine (TRILEPTAL) 150 MG tablet  Take 450 mg by mouth at bedtime.   Yes [provider]  Tiotropium Bromide-Olodaterol (STIOLTO RESPIMAT) 2.5-2.5 MCG/ACT AERS Inhale 2 puffs into the lungs daily. 05/26/17  Yes Henson, [name], [name]  esomeprazole (NEXIUM) 40 MG capsule Take 1 capsule (40 mg total) by mouth daily. Patient not taking: Reported on 07/14/2017 05/02/17   Avanell ShackletonHenson, [name], [name]    Physical Exam: Vitals:   07/14/17 1949 07/14/17 2033 07/15/17 0035 07/15/17 0521  BP: (!) 114/96   99/63  Pulse: 99   86  Resp: 18   15  Temp: 98.2 F (36.8 C)   97.8 F (36.6 C)  TempSrc: Oral   Oral  SpO2: 95% 92% 94% 96%  Weight:      Height:          Constitutional: Moderately built and nourished. Vitals:   07/14/17 1949 07/14/17 2033 07/15/17 0035 07/15/17 0521  BP: (!) 114/96   99/63  Pulse: 99   86  Resp: 18   15  Temp: 98.2 F (36.8 C)   97.8 F (36.6 C)  TempSrc: Oral   Oral  SpO2: 95% 92% 94% 96%  Weight:      Height:       Eyes: Anicteric no pallor. ENMT: No discharge from the ears eyes nose or mouth. Neck: No mass felt.  No neck rigidity. Respiratory: No rhonchi or crepitation at the time of my exam.  Patient's nurse states that she had wheezing periodically. Cardiovascular: S1-S2 heard. Abdomen: Soft nontender bowel sounds present. Musculoskeletal: No edema.  No joint effusion. Skin: No rash.  Skin appears warm. Neurologic: Alert awake oriented to time place and person.  Moves all extremities. Psychiatric: Appears normal.  Normal affect.   Labs on Admission: I have personally reviewed following labs and imaging studies  CBC: Recent Labs  Lab 07/14/17 1201 07/14/17 2249  WBC 9.5 10.2  HGB 11.8* 11.0*  HCT 34.5* 32.5*  MCV 91.5 91.8  PLT 179 197   Basic Metabolic Panel: Recent Labs  Lab 07/14/17 1201 07/14/17 2249  NA 125* 127*  K 4.1 4.5  CL 91* 93*  CO2 24 22  GLUCOSE 97 156*  BUN 10 9  CREATININE 0.63 0.72  CALCIUM 9.3 9.2   GFR: Estimated Creatinine Clearance:  55.2 mL/min (by C-G formula based on SCr of 0.72 mg/dL). Liver Function Tests: No results for input(s): AST, ALT, ALKPHOS, BILITOT, PROT, ALBUMIN in the last 168 hours. No results for input(s): LIPASE, AMYLASE in the last 168 hours. No results for input(s): AMMONIA in the last 168 hours. Coagulation Profile: No results for input(s): INR, PROTIME in the last 168 hours. Cardiac Enzymes: Recent Labs  Lab 07/14/17 1201 07/14/17 1630 07/14/17 2249  TROPONINI 0.04* 0.04* 0.03*   BNP (last 3 results) No results for input(s): PROBNP in the last 8760 hours. HbA1C: No results for input(s):  HGBA1C in the last 72 hours. CBG: No results for input(s): GLUCAP in the last 168 hours. Lipid Profile: No results for input(s): CHOL, HDL, LDLCALC, TRIG, CHOLHDL, LDLDIRECT in the last 72 hours. Thyroid Function Tests: No results for input(s): TSH, T4TOTAL, FREET4, T3FREE, THYROIDAB in the last 72 hours. Anemia Panel: No results for input(s): VITAMINB12, FOLATE, FERRITIN, TIBC, IRON, RETICCTPCT in the last 72 hours. Urine analysis:    Component Value Date/Time   COLORURINE STRAW (A) 07/14/2017 2110   APPEARANCEUR CLEAR 07/14/2017 2110   LABSPEC 1.005 07/14/2017 2110   LABSPEC 1.020 05/18/2017 1402   PHURINE 5.0 07/14/2017 2110   GLUCOSEU NEGATIVE 07/14/2017 2110   HGBUR NEGATIVE 07/14/2017 2110   BILIRUBINUR NEGATIVE 07/14/2017 2110   BILIRUBINUR negative 05/18/2017 1402   KETONESUR NEGATIVE 07/14/2017 2110   PROTEINUR NEGATIVE 07/14/2017 2110   NITRITE NEGATIVE 07/14/2017 2110   LEUKOCYTESUR NEGATIVE 07/14/2017 2110   Sepsis Labs: @LABRCNTIP (procalcitonin:4,lacticidven:4) ) Recent Results (from the past 240 hour(s))  Respiratory Panel by PCR     Status: None   Collection Time: 07/14/17  3:46 PM  Result Value Ref Range Status   Adenovirus NOT DETECTED NOT DETECTED Final   Coronavirus 229E NOT DETECTED NOT DETECTED Final   Coronavirus HKU1 NOT DETECTED NOT DETECTED Final   Coronavirus  NL63 NOT DETECTED NOT DETECTED Final   Coronavirus OC43 NOT DETECTED NOT DETECTED Final   Metapneumovirus NOT DETECTED NOT DETECTED Final   Rhinovirus / Enterovirus NOT DETECTED NOT DETECTED Final   Influenza A NOT DETECTED NOT DETECTED Final   Influenza B NOT DETECTED NOT DETECTED Final   Parainfluenza Virus 1 NOT DETECTED NOT DETECTED Final   Parainfluenza Virus 2 NOT DETECTED NOT DETECTED Final   Parainfluenza Virus 3 NOT DETECTED NOT DETECTED Final   Parainfluenza Virus 4 NOT DETECTED NOT DETECTED Final   Respiratory Syncytial Virus NOT DETECTED NOT DETECTED Final   Bordetella pertussis NOT DETECTED NOT DETECTED Final   Chlamydophila pneumoniae NOT DETECTED NOT DETECTED Final   Mycoplasma pneumoniae NOT DETECTED NOT DETECTED Final    Comment: Performed at [facility], [address] [place], [place] [address]     Radiological Exams on Admission: Dg Chest 2 View  Result Date: 07/14/2017 CLINICAL DATA:  Shortness of breath.  Chest tightness. EXAM: CHEST  2 VIEW COMPARISON:  CT 05/29/2017.  Chest x-ray 05/28/2017. FINDINGS: Mediastinum and hilar structures normal. Cardiomegaly with normal pulmonary vascularity. Mild bilateral increased interstitial markings. Mild pneumonitis cannot be excluded No focal infiltrate. No pleural effusion or pneumothorax. Degenerative changes thoracic spine. IMPRESSION: Mild bilateral increased interstitial markings. Mild pneumonitis cannot be excluded. Electronically Signed   By: Maisie Fus  Register   On: 07/14/2017 12:59    EKG: Independently reviewed.  Normal sinus rhythm.  Assessment/Plan Active Problems:   Essential hypertension   COPD exacerbation (HCC)   Acute on chronic respiratory failure (HCC)   Hyponatremia   Acute respiratory failure with hypoxia (HCC)   Elevated troponin    1. Acute respiratory failure with hypoxia -differentials include possible COPD exacerbation/possible pneumonia.  Respiratory viral panel has been pending.  Check  urine for Legionella strep antigen sputum cultures.  Patient is empirically placed on antibiotics for healthcare associated pneumonia since patient was recently admitted in October.  Will check d-dimer if positive will get CT angiogram of the chest to rule out PE. 2. Elevated troponin -denies any chest pain.  We will cycle cardiac markers check 2D echo.  3. Hyponatremia -appears to be chronic.  Patient is also on Trileptal but patient not want at this time to be stopping it.  Will check urine studies for hyponatremia check TSH.  Follow metabolic panel closely. 4. Hypertension on lisinopril. 5. Hyperlipidemia on statins. 6. Tobacco abuse patient states she stopped 3 days ago. 7. Hypothyroidism on Synthroid. 8. Constipation on stool softeners. 9. Anxiety on Valium. 10. Chronic anemia -follow CBC. 11. Patient states she takes Trileptal for sleeping.   DVT prophylaxis: Lovenox. Code Status: Full code. Family Communication: Discussed with patient. Disposition Plan: Home. Consults called: None. Admission status: Observation.   Eduard ClosArshad N Octavion Mollenkopf MD [facility] Pager [phone].  If 7PM-7AM, please contact night-coverage [email or web address] Password [email or web address]  07/15/2017, 7:34 AM

## 2017-07-15 NOTE — Progress Notes (Signed)
[facility] PLAN OF CARE NOTE Patient: Brenda Dunn BJY:[id number]   PCP: Avanell ShackletonHenson, [name], [name] DOB: 04/21/1951   DOA: 07/14/2017   DOS: 07/15/2017    Patient was admitted by my colleague Dr. Toniann FailKakrakandy earlier on 07/15/2017. I have reviewed the H&P as well as assessment and plan and agree with the same. Important changes in the plan are listed below.  Plan of care: Active Problems:   Essential hypertension   COPD exacerbation (HCC)   Acute on chronic respiratory failure (HCC)   Hyponatremia   Acute respiratory failure with hypoxia (HCC)   Elevated troponin Acute pulmonary embolism. Provoked, mild elevation of d-dimer, recent long travel with the cruise. CT scan positive for left lower lobe pulmonary arterial embolus. Echocardiogram shows preserved EF, normal RV function. Troponin negative. No history of significant GI bleed in the past no recent surgery no recent procedures. Given that I recommend patient start on oral DO AC medication for antic regulation for at least 3 months. Check lower extremity Doppler. After discussion with case manager Eliquis was chosen for treatment. Patient was given information regarding Coumadin, heparin, Lovenox, Xarelto including their side effects and reversal method.  Author: Lynden OxfordPranav Patel, MD [facility] Pager: [phone] 07/15/2017 5:14 PM   If 7PM-7AM, please contact night-coverage at [email or web address], password [email or web address]

## 2017-07-15 NOTE — Progress Notes (Signed)
Pt taken to radiology via wheelchair off tele, off O2 for her CT chest

## 2017-07-15 NOTE — Progress Notes (Addendum)
ANTICOAGULATION CONSULT NOTE   Pharmacy Consult for Eliquis Indication: pulmonary embolus  No Known Allergies  Patient Measurements: Height: 4\' 11"  (149.9 cm) Weight: 132 lb (59.9 kg) IBW/kg (Calculated) : 43.2  Vital Signs: Temp: 97.8 F (36.6 C) (12/15 0521) Temp Source: Oral (12/15 0521) BP: 99/63 (12/15 0521) Pulse Rate: 86 (12/15 0521)  Labs: Recent Labs    07/14/17 1201 07/14/17 1630 07/14/17 2249 07/15/17 0536  HGB 11.8*  --  11.0* 10.0*  HCT 34.5*  --  32.5* 30.0*  PLT 179  --  197 212  CREATININE 0.63  --  0.72 0.64  TROPONINI 0.04* 0.04* 0.03* <0.03    Estimated Creatinine Clearance: 55.2 mL/min (by C-G formula based on SCr of 0.64 mg/dL).   Medical History: Past Medical History:  Diagnosis Date  . Anxiety   . Benzodiazepine dependence (HCC)   . Colonic polyp    mulitple polyps. repeat in 05/2019  . Constipation    IBS  . COPD (chronic obstructive pulmonary disease) (HCC)   . Depression   . GERD (gastroesophageal reflux disease)   . Hyperlipidemia   . Hypertension   . Insomnia   . Migraine headache   . OAB (overactive bladder)   . Osteoarthritis   . Post-surgical hypothyroidism   . Scoliosis     Medications:  Medications Prior to Admission  Medication Sig Dispense Refill Last Dose  . albuterol (PROVENTIL HFA;VENTOLIN HFA) 108 (90 Base) MCG/ACT inhaler Inhale 2 puffs into the lungs every 4 (four) hours as needed for wheezing or shortness of breath. 1 Inhaler 1 07/13/2017 at Unknown time  . atorvastatin (LIPITOR) 40 MG tablet Take 40 mg by mouth daily.   07/13/2017 at Unknown time  . chlorpheniramine-HYDROcodone (TUSSIONEX) 10-8 MG/5ML SUER Take 5 mLs by mouth at bedtime.  0 07/13/2017 at Unknown time  . diazepam (VALIUM) 5 MG tablet Take 5 mg by mouth. Take 1 tablet in the am, 1/2 tablet at lunch and 1 tablet at night   07/13/2017 at Unknown time  . Docusate Calcium (STOOL SOFTENER PO) Take 3 tablets by mouth daily.   07/13/2017 at Unknown  time  . gabapentin (NEURONTIN) 300 MG capsule Take 300 mg by mouth 3 (three) times daily.   07/13/2017 at Unknown time  . ipratropium-albuterol (DUONEB) 0.5-2.5 (3) MG/3ML SOLN Take 3 mLs by nebulization 3 (three) times daily. 360 mL 1 07/14/2017 at Unknown time  . levothyroxine (SYNTHROID, LEVOTHROID) 100 MCG tablet Take 100 mcg by mouth daily before breakfast.   07/13/2017 at Unknown time  . LINZESS 290 MCG CAPS capsule TAKE 1 CAPSULE BY MOUTH ONCE A DAY 90 capsule 0 07/13/2017 at Unknown time  . lisinopril (PRINIVIL,ZESTRIL) 40 MG tablet Take 40 mg by mouth daily.   07/13/2017 at Unknown time  . mirtazapine (REMERON) 45 MG tablet Take 45 mg by mouth at bedtime.   07/13/2017 at Unknown time  . MYRBETRIQ 50 MG TB24 tablet TAKE 1 TABLET BY MOUTH EVERY DAY 90 tablet 1 07/13/2017 at Unknown time  . omeprazole (PRILOSEC) 40 MG capsule Take 40 mg by mouth daily.   07/13/2017 at Unknown time  . OXcarbazepine (TRILEPTAL) 150 MG tablet Take 450 mg by mouth at bedtime.   07/13/2017 at Unknown time  . Tiotropium Bromide-Olodaterol (STIOLTO RESPIMAT) 2.5-2.5 MCG/ACT AERS Inhale 2 puffs into the lungs daily. 1 Inhaler 0 unknown  . esomeprazole (NEXIUM) 40 MG capsule Take 1 capsule (40 mg total) by mouth daily. (Patient not taking: Reported on 07/14/2017) 30 capsule 3  Not Taking at Unknown time   Scheduled:  . apixaban  10 mg Oral BID   Followed by  . [START ON 07/22/2017] apixaban  5 mg Oral BID  . atorvastatin  40 mg Oral q1800  . benzonatate  100 mg Oral TID  . budesonide (PULMICORT) nebulizer solution  0.25 mg Nebulization BID  . dextromethorphan-guaiFENesin  1 tablet Oral BID  . diazepam  2.5 mg Oral QPC lunch  . diazepam  5 mg Oral BID  . docusate sodium  100 mg Oral TID  . gabapentin  300 mg Oral TID  . iopamidol      . ipratropium-albuterol  3 mL Nebulization TID  . levothyroxine  100 mcg Oral QHS  . linaclotide  290 mcg Oral QHS  . lisinopril  40 mg Oral QHS  . methylPREDNISolone  (SOLU-MEDROL) injection  40 mg Intravenous Daily  . mirabegron ER  50 mg Oral QHS  . mirtazapine  45 mg Oral QHS  . OXcarbazepine  450 mg Oral QHS  . pantoprazole  40 mg Oral QHS    Assessment: [age] yoF with COPD, HTN, HLD, tobacco use admitted for acute resp failure. Treating for COPD vs PNA, but following elevated D-dimer and elevated troponins, a CTa was done which showed LLL thrombus. Pharmacy to dose Eliquis for PE.   Today, 07/15/2017:  Hgb low but stable; Plt wnl  SCr wnl; CrCl 55 ml/min  Although charted as incomplete, patient states she did receive Lovenox 40 mg SQ injection this morning some time before CTa done around 7 AM  No drug interactions with current or home medications noted  Goal of Therapy:  Prevention of worsening PE or recurrent PE   Plan:   Discontinue Lovenox SQ ppx  Eliquis 10 mg PO bid x 7 days, followed by 5 mg PO bid thereafter  Monitor SCr, CBC while on Eliquis  Monitor for signs of bleeding or worsened thrombosis  Pharmacy to provide education prior to discharge   Bernadene Personrew Storm Dulski, PharmD, BCPS Pager: [phone] 07/15/2017, 10:49 AM

## 2017-07-15 NOTE — Progress Notes (Signed)
SATURATION QUALIFICATIONS: (This note is used to comply with regulatory documentation for home oxygen)  Patient Saturations on Room Air at Rest = 94%  Patient Saturations on Room Air while Ambulating = 90%    

## 2017-07-15 NOTE — Progress Notes (Signed)
  Echocardiogram 2D Echocardiogram has been performed.  Roosvelt MaserLane, Zenon Leaf F 07/15/2017, 3:41 PM

## 2017-07-15 NOTE — Progress Notes (Signed)
3rd Troponin level = 0.03 - had been 0.04

## 2017-07-15 NOTE — Care Management Note (Signed)
Case Management Note  Patient Details  Name: Brenda Dunn MRN: [id number] Date of Birth: 10/29/1950  Subjective/Objective:      COPD exacerbation              Action/Plan: Discharge Planning: NCM spoke to pt and provided with Eliquis 30 day free trial card. [facility]. Unable to check benefit on weekend. Has neb machine and RW. Will check to make home oxygen is not needed. Will have Unit RN check walking oxygen sat.   PCP Avanell ShackletonHENSON, [name] MD  Expected Discharge Date:  (07/16/17)               Expected Discharge Plan:  Home/Self Care  In-House Referral:  NA  Discharge planning Services  CM Consult  Post Acute Care Choice:  NA Choice offered to:  NA  DME Arranged:  N/A DME Agency:  NA  HH Arranged:  NA HH Agency:  NA  Status of Service:  Completed, signed off  If discussed at Long Length of Stay Meetings, dates discussed:    Additional Comments:  Elliot CousinShavis, Kaesen Rodriguez Ellen, RN 07/15/2017, 4:33 PM

## 2017-07-15 NOTE — Discharge Instructions (Addendum)
Information on my medicine - ELIQUIS (apixaban)  This medication education was reviewed with me or my healthcare representative as part of my discharge preparation.    Why was Eliquis prescribed for you? Eliquis was prescribed to treat blood clots that may have been found in the veins of your legs (deep vein thrombosis) or in your lungs (pulmonary embolism) and to reduce the risk of them occurring again.  What do You need to know about Eliquis ? The starting dose is 10 mg (two 5 mg tablets) taken TWICE daily for the FIRST SEVEN (7) DAYS, then on 07/22/17  the dose is reduced to ONE 5 mg tablet taken TWICE daily.  Eliquis may be taken with or without food.   Try to take the dose about the same time in the morning and in the evening. If you have difficulty swallowing the tablet whole please discuss with your pharmacist how to take the medication safely.  Take Eliquis exactly as prescribed and DO NOT stop taking Eliquis without talking to the doctor who prescribed the medication.  Stopping may increase your risk of developing a new blood clot.  Refill your prescription before you run out.  After discharge, you should have regular check-up appointments with your healthcare provider that is prescribing your Eliquis.    What do you do if you miss a dose? If a dose of ELIQUIS is not taken at the scheduled time, take it as soon as possible on the same day and twice-daily administration should be resumed. The dose should not be doubled to make up for a missed dose.  Important Safety Information A possible side effect of Eliquis is bleeding. You should call your healthcare provider right away if you experience any of the following: ? Bleeding from an injury or your nose that does not stop. ? Unusual colored urine (red or dark brown) or unusual colored stools (red or black). ? Unusual bruising for unknown reasons. ? A serious fall or if you hit your head (even if there is no bleeding).  Some  medicines may interact with Eliquis and might increase your risk of bleeding or clotting while on Eliquis. To help avoid this, consult your healthcare provider or pharmacist prior to using any new prescription or non-prescription medications, including herbals, vitamins, non-steroidal anti-inflammatory drugs (NSAIDs) and supplements.  This website has more information on Eliquis (apixaban): http://www.eliquis.com/eliquis/home      Chronic Obstructive Pulmonary Disease Exacerbation Chronic obstructive pulmonary disease (COPD) is a common lung problem. In COPD, the flow of air from the lungs is limited. COPD exacerbations are times that breathing gets worse and you need extra treatment. Without treatment they can be life threatening. If they happen often, your lungs can become more damaged. If your COPD gets worse, your doctor may treat you with:  Medicines.  Oxygen.  Different ways to clear your airway, such as using a mask.  Follow these instructions at home:  Do not smoke.  Avoid tobacco smoke and other things that bother your lungs.  If given, take your antibiotic medicine as told. Finish the medicine even if you start to feel better.  Only take medicines as told by your doctor.  Drink enough fluids to keep your pee (urine) clear or pale yellow (unless your doctor has told you not to).  Use a cool mist machine (vaporizer).  If you use oxygen or a machine that turns liquid medicine into a mist (nebulizer), continue to use them as told.  Keep up with shots (vaccinations)  as told by your doctor.  Exercise regularly.  Eat healthy foods.  Keep all doctor visits as told. Get help right away if:  You are very short of breath and it gets worse.  You have trouble talking.  You have bad chest pain.  You have blood in your spit (sputum).  You have a fever.  You keep throwing up (vomiting).  You feel weak, or you pass out (faint).  You feel confused.  You keep  getting worse. This information is not intended to replace advice given to you by your health care provider. Make sure you discuss any questions you have with your health care provider. Document Released: 07/07/2011 Document Revised: 12/24/2015 Document Reviewed: 03/22/2013 Elsevier Interactive Patient Education  2017 [employer]

## 2017-07-16 ENCOUNTER — Inpatient Hospital Stay (HOSPITAL_COMMUNITY): Payer: Medicare Other

## 2017-07-16 DIAGNOSIS — I2699 Other pulmonary embolism without acute cor pulmonale: Secondary | ICD-10-CM

## 2017-07-16 LAB — BASIC METABOLIC PANEL
ANION GAP: 8 (ref 5–15)
BUN: 12 mg/dL (ref 6–20)
CALCIUM: 9.4 mg/dL (ref 8.9–10.3)
CO2: 24 mmol/L (ref 22–32)
CREATININE: 0.79 mg/dL (ref 0.44–1.00)
Chloride: 102 mmol/L (ref 101–111)
GLUCOSE: 137 mg/dL — AB (ref 65–99)
Potassium: 3.8 mmol/L (ref 3.5–5.1)
Sodium: 134 mmol/L — ABNORMAL LOW (ref 135–145)

## 2017-07-16 LAB — CBC
HCT: 34.1 % — ABNORMAL LOW (ref 36.0–46.0)
HEMOGLOBIN: 11.1 g/dL — AB (ref 12.0–15.0)
MCH: 31 pg (ref 26.0–34.0)
MCHC: 32.6 g/dL (ref 30.0–36.0)
MCV: 95.3 fL (ref 78.0–100.0)
PLATELETS: 214 10*3/uL (ref 150–400)
RBC: 3.58 MIL/uL — ABNORMAL LOW (ref 3.87–5.11)
RDW: 13.6 % (ref 11.5–15.5)
WBC: 12.7 10*3/uL — ABNORMAL HIGH (ref 4.0–10.5)

## 2017-07-16 MED ORDER — TIOTROPIUM BROMIDE-OLODATEROL 2.5-2.5 MCG/ACT IN AERS: 2.0000 | INHALATION_SPRAY | Freq: Every day | RESPIRATORY_TRACT | 0 refills | Status: DC

## 2017-07-16 MED ORDER — HYDROCOD POLST-CPM POLST ER 10-8 MG/5ML PO SUER: 5.0000 mL | Freq: Two times a day (BID) | ORAL | 0 refills | Status: DC | PRN

## 2017-07-16 MED ORDER — ALBUTEROL SULFATE HFA 108 (90 BASE) MCG/ACT IN AERS: 2.0000 | INHALATION_SPRAY | RESPIRATORY_TRACT | 1 refills | Status: DC | PRN

## 2017-07-16 MED ORDER — PREDNISONE 10 MG PO TABS: ORAL_TABLET | ORAL | 0 refills | Status: DC

## 2017-07-16 MED ORDER — DM-GUAIFENESIN ER 30-600 MG PO TB12: 1.0000 | ORAL_TABLET | Freq: Two times a day (BID) | ORAL | 0 refills | Status: DC

## 2017-07-16 MED ORDER — BENZONATATE 100 MG PO CAPS: 100.0000 mg | ORAL_CAPSULE | Freq: Three times a day (TID) | ORAL | 0 refills | Status: DC

## 2017-07-16 MED ORDER — IPRATROPIUM-ALBUTEROL 0.5-2.5 (3) MG/3ML IN SOLN: 3.0000 mL | Freq: Three times a day (TID) | RESPIRATORY_TRACT | 0 refills | Status: DC

## 2017-07-16 MED ORDER — HYDROCOD POLST-CPM POLST ER 10-8 MG/5ML PO SUER
5.0000 mL | Freq: Two times a day (BID) | ORAL | Status: DC
  Administered 2017-07-16: 5 mL via ORAL
  Filled 2017-07-16: qty 5

## 2017-07-16 MED ORDER — DOXYCYCLINE HYCLATE 100 MG PO TABS
100.0000 mg | ORAL_TABLET | Freq: Two times a day (BID) | ORAL | Status: DC
  Administered 2017-07-16: 100 mg via ORAL
  Filled 2017-07-16: qty 1

## 2017-07-16 MED ORDER — APIXABAN 5 MG PO TABS: ORAL_TABLET | ORAL | 0 refills | Status: DC

## 2017-07-16 MED ORDER — DOXYCYCLINE HYCLATE 100 MG PO TABS: 100.0000 mg | ORAL_TABLET | Freq: Two times a day (BID) | ORAL | 0 refills | Status: AC

## 2017-07-16 NOTE — Progress Notes (Signed)
SATURATION QUALIFICATIONS: (This note is used to comply with regulatory documentation for home oxygen)  Patient Saturations on Room Air at Rest = 96%  Patient Saturations on Room Air while Ambulating = 93%  

## 2017-07-16 NOTE — Progress Notes (Signed)
Discharge instructions and medications discussed with patient.  Prescription and AVS given to patient.  All questions answered.  

## 2017-07-16 NOTE — Progress Notes (Signed)
LE venous duplex prelim: negative for DVT. Cherye Gaertner Eunice, RDMS, RVT  

## 2017-07-17 ENCOUNTER — Telehealth: Payer: Self-pay | Admitting: Family Medicine

## 2017-07-17 NOTE — Telephone Encounter (Signed)
Called pt for hospital follow up, she is doing better, said has blood clot in lung, taking Eliquis.  Her meds were reconciled current and discharged, she has gotten her new meds and are taking them with no problems.  Appt was set for Thursday.

## 2017-07-18 NOTE — Discharge Summary (Signed)
[facility] Discharge Summary   Patient: Brenda PonderSonja E Guidroz ZOX:[id number]   PCP: Avanell ShackletonHenson, [name], [name] DOB: 05/11/1951   Date of admission: 07/14/2017   Date of discharge: 07/16/2017     Discharge Diagnoses:  Active Problems:   Essential hypertension   COPD exacerbation (HCC)   Acute on chronic respiratory failure (HCC)   Hyponatremia   Acute respiratory failure with hypoxia (HCC)   Elevated troponin   Acute pulmonary embolism (HCC)   Admitted From: home Disposition:  home  Recommendations for Outpatient Follow-up:  1. Please follow-up with PCP in 1 week  Follow-up Information    Henson, [name], [name]. Schedule an appointment as soon as possible for a visit in 1 week(s).   Specialty:  Family Medicine Contact information: [address] [place] [address] [phone]          Diet recommendation: Cardiac diet  Activity: The patient is advised to gradually reintroduce usual activities.  Discharge Condition: good  Code Status: Full code  History of present illness: As per the H and P dictated on admission, "Brenda PonderSonja E Bleier is a 66 y.o. female with COPD, hypertension, hyperlipidemia and tobacco abuse presents to the ER with complaints of persistent cough since December 11.  Patient had just come back from a cruise from [place] and released on December 6.  Patient states that during the cruise he did have some throat congestion.  Denies any fever chills chest pain nausea vomiting diarrhea fever or chills.  December 11 patient has been having persistent dry cough which has been progressively worsening with shortness of breath.  "  Hospital Course:  Summary of her active problems in the hospital is as following. Acute pulmonary embolism. Provoked, mild elevation of d-dimer, recent long travel with the cruise. CT scan positive for left lower lobe pulmonary arterial embolus. Echocardiogram shows preserved EF, normal RV function. Troponin negative. No history  of significant GI bleed in the past no recent surgery no recent procedures. Given that I recommend patient start on oral DO AC medication for antic regulation for at least 3 months. Negative lower extremity Doppler. After discussion with case manager Eliquis was chosen for treatment. Patient was given information regarding other treatment options including their side effects and reversal method.  Acute respiratory failure. COPD exacerbation with bronchitis. Continue antibiotics steroids and nebulizer on home. Continue Mucinex. Patient has severe coughing spells, will also provide Tussionex on discharge.  Elevated troponin. Most likely demand ischemia, no evidence of acute ACS. Outpatient follow-up.  Hyponatremia. Chronic. Patient asymptomatic. Most likely from medication that she is on. Patient would not want to change any medication at present. Follow-up with PCP as an outpatient.  Essential hypertension. Continue home regimen.  Dyslipidemia. Continue statin.  Active smoker. Recommended patient as well as a her husband to stop smoking as patient can have reactive airway disease due to secondhand smoking.  Hypothyroidism. Continue Synthroid.  Constipation. Resolved with stool softeners in the hospital.  Anxiety and prolonged grief.. Continue home regimen for now.  All other chronic medical condition were stable during the hospitalization.  Patient was ambulatory without any assistance. On the day of the discharge the patient's vitals were stable, and no other acute medical condition were reported by patient. the patient was felt safe to be discharge at home with home.  Procedures and Results:  Echocardiogram      Consultations:  none  DISCHARGE MEDICATION: Allergies as of 07/16/2017   No Known Allergies     Medication List  STOP taking these medications   esomeprazole 40 MG capsule Commonly known as:  NEXIUM     TAKE these medications   albuterol  108 (90 Base) MCG/ACT inhaler Commonly known as:  PROVENTIL HFA;VENTOLIN HFA Inhale 2 puffs into the lungs every 4 (four) hours as needed for wheezing or shortness of breath.   apixaban 5 MG Tabs tablet Commonly known as:  ELIQUIS Take 10 mg twice a day till 07/21/2017, start taking 5 mg twice a day from 07/22/2017   atorvastatin 40 MG tablet Commonly known as:  LIPITOR Take 40 mg by mouth daily.   benzonatate 100 MG capsule Commonly known as:  TESSALON Take 1 capsule (100 mg total) by mouth 3 (three) times daily.   chlorpheniramine-HYDROcodone 10-8 MG/5ML Suer Commonly known as:  TUSSIONEX Take 5 mLs by mouth every 12 (twelve) hours as needed for cough. What changed:    when to take this  reasons to take this   dextromethorphan-guaiFENesin 30-600 MG 12hr tablet Commonly known as:  MUCINEX DM Take 1 tablet by mouth 2 (two) times daily.   diazepam 5 MG tablet Commonly known as:  VALIUM Take 5 mg by mouth. Take 1 tablet in the am, 1/2 tablet at lunch and 1 tablet at night   doxycycline 100 MG tablet Commonly known as:  VIBRA-TABS Take 1 tablet (100 mg total) by mouth every 12 (twelve) hours for 4 days.   gabapentin 300 MG capsule Commonly known as:  NEURONTIN Take 300 mg by mouth 3 (three) times daily.   ipratropium-albuterol 0.5-2.5 (3) MG/3ML Soln Commonly known as:  DUONEB Take 3 mLs by nebulization 3 (three) times daily.   levothyroxine 100 MCG tablet Commonly known as:  SYNTHROID, LEVOTHROID Take 100 mcg by mouth daily before breakfast.   LINZESS 290 MCG Caps capsule Generic drug:  linaclotide TAKE 1 CAPSULE BY MOUTH ONCE A DAY   lisinopril 40 MG tablet Commonly known as:  PRINIVIL,ZESTRIL Take 40 mg by mouth daily.   mirtazapine 45 MG tablet Commonly known as:  REMERON Take 45 mg by mouth at bedtime.   MYRBETRIQ 50 MG Tb24 tablet Generic drug:  mirabegron ER TAKE 1 TABLET BY MOUTH EVERY DAY   omeprazole 40 MG capsule Commonly known as:   PRILOSEC Take 40 mg by mouth daily.   OXcarbazepine 150 MG tablet Commonly known as:  TRILEPTAL Take 450 mg by mouth at bedtime.   predniSONE 10 MG tablet Commonly known as:  DELTASONE Take 30mg  daily for 3days,Take 20mg  daily for 3days,Take 10mg  daily for 3days, then stop.   STOOL SOFTENER PO Take 3 tablets by mouth daily.   Tiotropium Bromide-Olodaterol 2.5-2.5 MCG/ACT Aers Commonly known as:  STIOLTO RESPIMAT Inhale 2 puffs into the lungs daily.      No Known Allergies Discharge Instructions    Diet - low sodium heart healthy   Complete by:  As directed    Discharge instructions   Complete by:  As directed    It is important that you read following instructions as well as go over your medication list with RN to help you understand your care after this hospitalization.  Discharge Instructions: Please follow-up with PCP in one week  Please request your primary care physician to go over all Hospital Tests and Procedure/Radiological results at the follow up,  Please get all Hospital records sent to your PCP by signing hospital release before you go home.   Do not drive, operating heavy machinery, perform activities at heights, swimming or participation  in water activities or provide baby sitting services while you are on Pain, Sleep and Anxiety Medications; until you have been seen by Primary Care Physician or a Neurologist and advised to do so again. Do not take more than prescribed Pain, Sleep and Anxiety Medications. You were cared for by a hospitalist during your hospital stay. If you have any questions about your discharge medications or the care you received while you were in the hospital after you are discharged, you can call the unit and ask to speak with the hospitalist on call if the hospitalist that took care of you is not available.  Once you are discharged, your primary care physician will handle any further medical issues. Please note that NO REFILLS for any  discharge medications will be authorized once you are discharged, as it is imperative that you return to your primary care physician (or establish a relationship with a primary care physician if you do not have one) for your aftercare needs so that they can reassess your need for medications and monitor your lab values. You Must read complete instructions/literature along with all the possible adverse reactions/side effects for all the Medicines you take and that have been prescribed to you. Take any new Medicines after you have completely understood and accept all the possible adverse reactions/side effects. Wear Seat belts while driving. If you have smoked or chewed Tobacco in the last 2 yrs please stop smoking and/or stop any Recreational drug use.   Increase activity slowly   Complete by:  As directed      Discharge Exam: Filed Weights   07/14/17 1130  Weight: 59.9 kg (132 lb)   Vitals:   07/16/17 0823 07/16/17 0825  BP:    Pulse:    Resp:    Temp:    SpO2: 96% 96%   General: Appear in no distress, no Rash; Oral Mucosa moist. Cardiovascular: S1 and S2 Present, no Murmur, no JVD Respiratory: Bilateral Air entry present and Clear to Auscultation, no Crackles, no wheezes Abdomen: Bowel Sound present, Soft and no tenderness Extremities: no Pedal edema, no calf tenderness Neurology: Grossly no focal neuro deficit.  The results of significant diagnostics from this hospitalization (including imaging, microbiology, ancillary and laboratory) are listed below for reference.    Significant Diagnostic Studies: Dg Chest 2 View  Result Date: 07/14/2017 CLINICAL DATA:  Shortness of breath.  Chest tightness. EXAM: CHEST  2 VIEW COMPARISON:  CT 05/29/2017.  Chest x-ray 05/28/2017. FINDINGS: Mediastinum and hilar structures normal. Cardiomegaly with normal pulmonary vascularity. Mild bilateral increased interstitial markings. Mild pneumonitis cannot be excluded No focal infiltrate. No pleural  effusion or pneumothorax. Degenerative changes thoracic spine. IMPRESSION: Mild bilateral increased interstitial markings. Mild pneumonitis cannot be excluded. Electronically Signed   By: Maisie Fushomas  Register   On: 07/14/2017 12:59   Ct Angio Chest Pe W Or Wo Contrast  Result Date: 07/15/2017 CLINICAL DATA:  Shortness of breath with cough for several days EXAM: CT ANGIOGRAPHY CHEST WITH CONTRAST TECHNIQUE: Multidetector CT imaging of the chest was performed using the standard protocol during bolus administration of intravenous contrast. Multiplanar CT image reconstructions and MIPs were obtained to evaluate the vascular anatomy. CONTRAST:  100mL ISOVUE-370 IOPAMIDOL (ISOVUE-370) INJECTION 76% COMPARISON:  Plain film from 07/14/2017, CT from 05/29/2017. FINDINGS: Cardiovascular: Thoracic aorta shows mild atherosclerotic change without aneurysmal dilatation. No cardiac enlargement is seen. No significant coronary calcifications are noted. The pulmonary artery demonstrates a normal branching pattern. Decreased opacification of the left lower lobe posteromedial branches  is noted with relatively normal appearing opacification in the remainder of the lungs. Some motion artifact is noted and a vague filling defect is identified consistent with pulmonary embolus. No other filling defects are identified. Mediastinum/Nodes: No significant hilar or mediastinal adenopathy is noted. The thoracic inlet is within normal limits. The esophagus is within normal limits with the exception of a small sliding-type hiatal hernia. Lungs/Pleura: The lungs are well aerated bilaterally with mild bibasilar atelectasis. Mild emphysematous changes are noted. No focal infiltrate or sizable effusion is seen. No sizable parenchymal nodules are noted. Upper Abdomen: No acute abnormality. Musculoskeletal: Degenerative changes of the thoracic spine are noted. No acute bony abnormality is seen. Review of the MIP images confirms the above findings.  IMPRESSION: Left lower lobe pulmonary arterial embolus with decreased opacification of the distal branches posterior medially. No focal infiltrate is seen.  Mild bibasilar atelectasis is noted. Aortic Atherosclerosis (ICD10-I70.0) and Emphysema (ICD10-J43.9). Electronically Signed   By: Alcide Clever M.D.   On: 07/15/2017 07:35    Microbiology: Recent Results (from the past 240 hour(s))  Respiratory Panel by PCR     Status: None   Collection Time: 07/14/17  3:46 PM  Result Value Ref Range Status   Adenovirus NOT DETECTED NOT DETECTED Final   Coronavirus 229E NOT DETECTED NOT DETECTED Final   Coronavirus HKU1 NOT DETECTED NOT DETECTED Final   Coronavirus NL63 NOT DETECTED NOT DETECTED Final   Coronavirus OC43 NOT DETECTED NOT DETECTED Final   Metapneumovirus NOT DETECTED NOT DETECTED Final   Rhinovirus / Enterovirus NOT DETECTED NOT DETECTED Final   Influenza A NOT DETECTED NOT DETECTED Final   Influenza B NOT DETECTED NOT DETECTED Final   Parainfluenza Virus 1 NOT DETECTED NOT DETECTED Final   Parainfluenza Virus 2 NOT DETECTED NOT DETECTED Final   Parainfluenza Virus 3 NOT DETECTED NOT DETECTED Final   Parainfluenza Virus 4 NOT DETECTED NOT DETECTED Final   Respiratory Syncytial Virus NOT DETECTED NOT DETECTED Final   Bordetella pertussis NOT DETECTED NOT DETECTED Final   Chlamydophila pneumoniae NOT DETECTED NOT DETECTED Final   Mycoplasma pneumoniae NOT DETECTED NOT DETECTED Final    Comment: Performed at [facility], [address], [place], [place] [address]     Labs: CBC: Recent Labs  Lab 07/14/17 1201 07/14/17 2249 07/15/17 0536 07/16/17 0626  WBC 9.5 10.2 9.8 12.7*  HGB 11.8* 11.0* 10.0* 11.1*  HCT 34.5* 32.5* 30.0* 34.1*  MCV 91.5 91.8 92.6 95.3  PLT 179 197 212 214   Basic Metabolic Panel: Recent Labs  Lab 07/14/17 1201 07/14/17 2249 07/15/17 0536 07/16/17 0626  NA 125* 127* 128* 134*  K 4.1 4.5 3.7 3.8  CL 91* 93* 95* 102  CO2 24 22 24 24   GLUCOSE  97 156* 102* 137*  BUN 10 9 12 12   CREATININE 0.63 0.72 0.64 0.79  CALCIUM 9.3 9.2 9.0 9.4   Liver Function Tests: No results for input(s): AST, ALT, ALKPHOS, BILITOT, PROT, ALBUMIN in the last 168 hours. No results for input(s): LIPASE, AMYLASE in the last 168 hours. No results for input(s): AMMONIA in the last 168 hours. Cardiac Enzymes: Recent Labs  Lab 07/14/17 1201 07/14/17 1630 07/14/17 2249 07/15/17 0536 07/15/17 1021  TROPONINI 0.04* 0.04* 0.03* <0.03 <0.03   BNP (last 3 results) Recent Labs    05/13/17 1029  BNP 13.0   CBG: No results for input(s): GLUCAP in the last 168 hours. Time spent: 35 minutes  Signed:  Lynden Oxford  [facility] 07/16/2017 , 9:04 AM

## 2017-07-20 ENCOUNTER — Ambulatory Visit (INDEPENDENT_AMBULATORY_CARE_PROVIDER_SITE_OTHER): Payer: Medicare Other | Admitting: Family Medicine

## 2017-07-20 ENCOUNTER — Encounter: Payer: Self-pay | Admitting: Family Medicine

## 2017-07-20 VITALS — BP 132/86 | HR 88 | Temp 98.4°F | Resp 16 | Ht 59.5 in | Wt 134.6 lb

## 2017-07-20 DIAGNOSIS — Z09 Encounter for follow-up examination after completed treatment for conditions other than malignant neoplasm: Secondary | ICD-10-CM

## 2017-07-20 DIAGNOSIS — I2699 Other pulmonary embolism without acute cor pulmonale: Secondary | ICD-10-CM | POA: Diagnosis not present

## 2017-07-20 DIAGNOSIS — F17200 Nicotine dependence, unspecified, uncomplicated: Secondary | ICD-10-CM

## 2017-07-20 DIAGNOSIS — I10 Essential (primary) hypertension: Secondary | ICD-10-CM

## 2017-07-20 DIAGNOSIS — R05 Cough: Secondary | ICD-10-CM

## 2017-07-20 DIAGNOSIS — E871 Hypo-osmolality and hyponatremia: Secondary | ICD-10-CM | POA: Diagnosis not present

## 2017-07-20 DIAGNOSIS — K5900 Constipation, unspecified: Secondary | ICD-10-CM | POA: Diagnosis not present

## 2017-07-20 DIAGNOSIS — J449 Chronic obstructive pulmonary disease, unspecified: Secondary | ICD-10-CM | POA: Diagnosis not present

## 2017-07-20 DIAGNOSIS — R059 Cough, unspecified: Secondary | ICD-10-CM

## 2017-07-20 MED ORDER — BENZONATATE 100 MG PO CAPS: 100.0000 mg | ORAL_CAPSULE | Freq: Three times a day (TID) | ORAL | 0 refills | Status: DC

## 2017-07-20 NOTE — Progress Notes (Signed)
   Subjective:    Patient ID: Brenda Dunn, female    DOB: 01/23/1951, 66 y.o.   MRN: [id number]  HPI Chief Complaint  Patient presents with  . Follow-up    hospital follow up. Still coughing even though she is using all these numerous cough medications, inhalers and nebulizer as directed. Asking for refill on albuterol inhaler and tessalon perles.    She is here to follow up on recent hospitalization for COPD exacerbation and acute pulmonary embolism.  She was discharged on 07/18/2017.  She was advised to follow-up in 1 week with me however due to the holidays she is decided to come in today.  Denies previous history of blood clot.  PE presumed to be related to recent vacation she took which included a cruise where she was less mobile than usual.  She is taking Eliquis and doing fine on this.  States she stopped smoking for approximately 1 week but has started back.  While in the hospital she had a cardiac workup.  She also had Doppler studies of her lower extremities and no DVT was found.   Denies fever, chills, dizziness, chest pain, palpitations, shortness of breath, abdominal pain, N/V/D, urinary symptoms, LE edema.  No bleeding.     Review of Systems Pertinent positives and negatives in the history of present illness.     Objective:   Physical Exam BP 132/86   Pulse 88   Temp 98.4 F (36.9 C) (Tympanic)   Resp 16   Ht 4' 11.5" (1.511 m)   Wt 134 lb 9.6 oz (61.1 kg)   SpO2 95%   BMI 26.73 kg/m   Alert and in no distress. Pharyngeal area is normal. Neck is supple without adenopathy or thyromegaly. Cardiac exam shows a regular sinus rhythm without murmurs or gallops. LLL with diminished lung sounds, otherwise lungs are clear to auscultation.  Normal work of breathing.  Speaking in full sentences without difficulty.  Skin is warm and dry, no pallor or cyanosis.       Assessment & Plan:  Other acute pulmonary embolism without acute cor pulmonale  (HCC)  Hyponatremia  Chronic obstructive pulmonary disease, unspecified COPD type (HCC)  Essential hypertension  Smoker  Follow-up exam  Cough - Plan: benzonatate (TESSALON) 100 MG capsule  Reviewed lab results, imaging results and notes from recent hospitalization and discharge. She appears to be doing well currently.  Reports good compliance with Eliquis and all medications. She is not in any distress today and has a normal work of breathing. She is in good spirits and is looking forward to the holidays.  Requests a refill on Tessalon Perles. Strongly encouraged her to stop smoking.  Her husband is with her who also smokes and I discussed smoking cessation with both of them. Reports having an appointment with a pulmonologist in the next 3-4 weeks. Vital signs including blood pressure is within goal range today. She is scheduled to return for Medicare annual wellness visit with me in 2 weeks.  At that time we will check labs.

## 2017-07-20 NOTE — Patient Instructions (Signed)
Add Miralax to your regimen for constipation. Start with 1 cap full in 8 ounces of water or you can even put this in your coffee. You can titrate this medication to keep your stools soft and regular.   Take the Eliquis daily as prescribed. Be aware of the dose change on the 22nd.   I will see you back in January as scheduled.

## 2017-07-21 ENCOUNTER — Telehealth: Payer: Self-pay | Admitting: Family Medicine

## 2017-07-21 MED ORDER — LEVOFLOXACIN 500 MG PO TABS: 500.0000 mg | ORAL_TABLET | Freq: Every day | ORAL | 0 refills | Status: DC

## 2017-07-21 NOTE — Telephone Encounter (Signed)
Pt called and states she is still having a real bad dry cough, states she was up all night coughing, states vickie gave her a refill on the albuterol inhaler and the cough pearls, states she can not get anything productive to come up, she wants to know if she can take any muci nux DM or anything that can ,help with the cough pt can be reached at [phone] informed pt that vickie was out of town

## 2017-07-21 NOTE — Telephone Encounter (Signed)
Pt aware. /RLB  

## 2017-07-21 NOTE — Telephone Encounter (Signed)
Pt states she has just finished doxycycline given to her in the hospital. She is still not feeling better. Already taking Mucinex DM. Vickie prescribed tessalon 100mg - no relief in cough. She has also taken tussionex at night with no relief. Pt is using inhaler every 4 hours. She is taking Prednisone.  Any other recommendations?    Advised cool mist humidifer. Trixie Rude/RLB

## 2017-07-21 NOTE — Telephone Encounter (Signed)
She can take Mucinex DM.  She can also try NyQuil at night.

## 2017-07-21 NOTE — Telephone Encounter (Signed)
Let her know that I called a different antibiotic in 

## 2017-07-28 ENCOUNTER — Telehealth: Payer: Self-pay | Admitting: Family Medicine

## 2017-07-28 ENCOUNTER — Other Ambulatory Visit: Payer: Self-pay

## 2017-07-28 MED ORDER — DOXYCYCLINE HYCLATE 100 MG PO TABS: 100.0000 mg | ORAL_TABLET | Freq: Two times a day (BID) | ORAL | 0 refills | Status: DC

## 2017-07-28 NOTE — Telephone Encounter (Signed)
Pt said she still has yellowish mucus, congestion, cough. She can not walk much like she used to without getting tired and out of breathe. Pt requesting another round of antibiotic. She is out of town but plan to be back this afternoon.

## 2017-07-28 NOTE — Telephone Encounter (Signed)
Submitted

## 2017-07-28 NOTE — Telephone Encounter (Signed)
I am ok putting her back on Doxycycline for now and see if this helps. When is her appointment with pulmonology? We may need to see if they can see her sooner if she is getting worse. Please send in Doxycycline 100 mg bid x 7 days. No refills.

## 2017-08-02 ENCOUNTER — Other Ambulatory Visit: Payer: Self-pay | Admitting: Family Medicine

## 2017-08-02 ENCOUNTER — Telehealth: Payer: Self-pay | Admitting: Family Medicine

## 2017-08-02 DIAGNOSIS — R059 Cough, unspecified: Secondary | ICD-10-CM

## 2017-08-02 DIAGNOSIS — R05 Cough: Secondary | ICD-10-CM

## 2017-08-02 MED ORDER — BENZONATATE 100 MG PO CAPS: 100.0000 mg | ORAL_CAPSULE | Freq: Three times a day (TID) | ORAL | 0 refills | Status: DC

## 2017-08-02 NOTE — Telephone Encounter (Signed)
Called pt she has appt in Feb for pulmonology.  She will try and get in sooner.  Sent in rx.  Pt is coming in tomorrow to see you.

## 2017-08-02 NOTE — Telephone Encounter (Signed)
Pt's husband called and stated that pt has been coughing really bad all afternoon. He was wanting to know if we could call something in for this pt for cough. Pt uses [facility] [address] [place]. He can be reached at  [phone].

## 2017-08-02 NOTE — Telephone Encounter (Signed)
I am ok with her having Tessalon refilled. Please let her know. When is her pulmonology appt? We need to get them involved with her chronic cough.

## 2017-08-03 ENCOUNTER — Inpatient Hospital Stay (HOSPITAL_COMMUNITY)
Admission: EM | Admit: 2017-08-03 | Discharge: 2017-08-07 | DRG: 190 | Disposition: A | Discharge status: Home / Self Care | Payer: PPO | Attending: Internal Medicine | Admitting: Internal Medicine

## 2017-08-03 ENCOUNTER — Emergency Department (HOSPITAL_COMMUNITY): Payer: PPO

## 2017-08-03 ENCOUNTER — Encounter (HOSPITAL_COMMUNITY): Payer: Self-pay | Admitting: Emergency Medicine

## 2017-08-03 ENCOUNTER — Ambulatory Visit (INDEPENDENT_AMBULATORY_CARE_PROVIDER_SITE_OTHER): Payer: PPO | Admitting: Family Medicine

## 2017-08-03 ENCOUNTER — Encounter: Payer: Self-pay | Admitting: Family Medicine

## 2017-08-03 VITALS — BP 124/72 | HR 86 | Temp 98.5°F | Resp 26 | Ht 59.5 in | Wt 137.0 lb

## 2017-08-03 DIAGNOSIS — R0602 Shortness of breath: Secondary | ICD-10-CM | POA: Diagnosis not present

## 2017-08-03 DIAGNOSIS — J441 Chronic obstructive pulmonary disease with (acute) exacerbation: Secondary | ICD-10-CM

## 2017-08-03 DIAGNOSIS — R05 Cough: Secondary | ICD-10-CM | POA: Diagnosis not present

## 2017-08-03 DIAGNOSIS — F17200 Nicotine dependence, unspecified, uncomplicated: Secondary | ICD-10-CM | POA: Diagnosis not present

## 2017-08-03 DIAGNOSIS — Z8701 Personal history of pneumonia (recurrent): Secondary | ICD-10-CM

## 2017-08-03 DIAGNOSIS — E785 Hyperlipidemia, unspecified: Secondary | ICD-10-CM | POA: Diagnosis not present

## 2017-08-03 DIAGNOSIS — E890 Postprocedural hypothyroidism: Secondary | ICD-10-CM | POA: Diagnosis not present

## 2017-08-03 DIAGNOSIS — J9601 Acute respiratory failure with hypoxia: Secondary | ICD-10-CM | POA: Diagnosis present

## 2017-08-03 DIAGNOSIS — Z86010 Personal history of colonic polyps: Secondary | ICD-10-CM

## 2017-08-03 DIAGNOSIS — Z86711 Personal history of pulmonary embolism: Secondary | ICD-10-CM | POA: Diagnosis not present

## 2017-08-03 DIAGNOSIS — R059 Cough, unspecified: Secondary | ICD-10-CM

## 2017-08-03 DIAGNOSIS — K219 Gastro-esophageal reflux disease without esophagitis: Secondary | ICD-10-CM | POA: Diagnosis present

## 2017-08-03 DIAGNOSIS — Z7901 Long term (current) use of anticoagulants: Secondary | ICD-10-CM

## 2017-08-03 DIAGNOSIS — M419 Scoliosis, unspecified: Secondary | ICD-10-CM | POA: Diagnosis present

## 2017-08-03 DIAGNOSIS — F419 Anxiety disorder, unspecified: Secondary | ICD-10-CM | POA: Diagnosis present

## 2017-08-03 DIAGNOSIS — I10 Essential (primary) hypertension: Secondary | ICD-10-CM | POA: Diagnosis present

## 2017-08-03 DIAGNOSIS — Z79899 Other long term (current) drug therapy: Secondary | ICD-10-CM

## 2017-08-03 DIAGNOSIS — R062 Wheezing: Secondary | ICD-10-CM

## 2017-08-03 DIAGNOSIS — K589 Irritable bowel syndrome without diarrhea: Secondary | ICD-10-CM | POA: Diagnosis not present

## 2017-08-03 DIAGNOSIS — N3281 Overactive bladder: Secondary | ICD-10-CM | POA: Diagnosis present

## 2017-08-03 DIAGNOSIS — F17210 Nicotine dependence, cigarettes, uncomplicated: Secondary | ICD-10-CM | POA: Diagnosis not present

## 2017-08-03 DIAGNOSIS — M1990 Unspecified osteoarthritis, unspecified site: Secondary | ICD-10-CM | POA: Diagnosis not present

## 2017-08-03 DIAGNOSIS — R0682 Tachypnea, not elsewhere classified: Secondary | ICD-10-CM

## 2017-08-03 DIAGNOSIS — G4700 Insomnia, unspecified: Secondary | ICD-10-CM | POA: Diagnosis not present

## 2017-08-03 DIAGNOSIS — R0902 Hypoxemia: Secondary | ICD-10-CM

## 2017-08-03 DIAGNOSIS — F1320 Sedative, hypnotic or anxiolytic dependence, uncomplicated: Secondary | ICD-10-CM | POA: Diagnosis present

## 2017-08-03 DIAGNOSIS — Z720 Tobacco use: Secondary | ICD-10-CM

## 2017-08-03 LAB — CBC WITH DIFFERENTIAL/PLATELET
BASOS ABS: 0 10*3/uL (ref 0.0–0.1)
BASOS PCT: 0 %
Eosinophils Absolute: 1.6 10*3/uL — ABNORMAL HIGH (ref 0.0–0.7)
Eosinophils Relative: 16 %
HCT: 37.6 % (ref 36.0–46.0)
HEMOGLOBIN: 12.3 g/dL (ref 12.0–15.0)
Lymphocytes Relative: 27 %
Lymphs Abs: 2.6 10*3/uL (ref 0.7–4.0)
MCH: 31.2 pg (ref 26.0–34.0)
MCHC: 32.7 g/dL (ref 30.0–36.0)
MCV: 95.4 fL (ref 78.0–100.0)
Monocytes Absolute: 0.7 10*3/uL (ref 0.1–1.0)
Monocytes Relative: 7 %
NEUTROS PCT: 50 %
Neutro Abs: 4.8 10*3/uL (ref 1.7–7.7)
Platelets: 206 10*3/uL (ref 150–400)
RBC: 3.94 MIL/uL (ref 3.87–5.11)
RDW: 13.3 % (ref 11.5–15.5)
WBC: 9.6 10*3/uL (ref 4.0–10.5)

## 2017-08-03 LAB — BASIC METABOLIC PANEL
ANION GAP: 9 (ref 5–15)
BUN: 13 mg/dL (ref 6–20)
CHLORIDE: 98 mmol/L — AB (ref 101–111)
CO2: 26 mmol/L (ref 22–32)
CREATININE: 0.83 mg/dL (ref 0.44–1.00)
Calcium: 9.1 mg/dL (ref 8.9–10.3)
GFR calc non Af Amer: >60 mL/min (ref >60)
Glucose, Bld: 89 mg/dL (ref 65–99)
POTASSIUM: 3.9 mmol/L (ref 3.5–5.1)
SODIUM: 133 mmol/L — AB (ref 135–145)

## 2017-08-03 LAB — I-STAT TROPONIN, ED: TROPONIN I, POC: 0 ng/mL (ref 0.00–0.08)

## 2017-08-03 MED ORDER — DM-GUAIFENESIN ER 30-600 MG PO TB12
1.0000 | ORAL_TABLET | Freq: Two times a day (BID) | ORAL | Status: DC
  Administered 2017-08-03 – 2017-08-07 (×8): 1 via ORAL
  Filled 2017-08-03 (×8): qty 1

## 2017-08-03 MED ORDER — GABAPENTIN 300 MG PO CAPS
600.0000 mg | ORAL_CAPSULE | Freq: Every day | ORAL | Status: DC
  Administered 2017-08-03 – 2017-08-06 (×4): 600 mg via ORAL
  Filled 2017-08-03 (×4): qty 2

## 2017-08-03 MED ORDER — ATORVASTATIN CALCIUM 40 MG PO TABS
40.0000 mg | ORAL_TABLET | Freq: Every day | ORAL | Status: DC
  Administered 2017-08-04 – 2017-08-06 (×3): 40 mg via ORAL
  Filled 2017-08-03 (×3): qty 1

## 2017-08-03 MED ORDER — LEVOTHYROXINE SODIUM 88 MCG PO TABS
88.0000 ug | ORAL_TABLET | Freq: Every day | ORAL | Status: DC
  Administered 2017-08-04 – 2017-08-07 (×4): 88 ug via ORAL
  Filled 2017-08-03 (×4): qty 1

## 2017-08-03 MED ORDER — OXCARBAZEPINE 300 MG PO TABS
450.0000 mg | ORAL_TABLET | Freq: Every day | ORAL | Status: DC
  Administered 2017-08-03 – 2017-08-06 (×4): 450 mg via ORAL
  Filled 2017-08-03 (×4): qty 1

## 2017-08-03 MED ORDER — HYDROCOD POLST-CPM POLST ER 10-8 MG/5ML PO SUER
5.0000 mL | Freq: Two times a day (BID) | ORAL | Status: DC | PRN
  Administered 2017-08-04 – 2017-08-05 (×3): 5 mL via ORAL
  Filled 2017-08-03 (×3): qty 5

## 2017-08-03 MED ORDER — LINACLOTIDE 145 MCG PO CAPS
290.0000 ug | ORAL_CAPSULE | Freq: Every day | ORAL | Status: DC
  Administered 2017-08-04 – 2017-08-07 (×4): 290 ug via ORAL
  Filled 2017-08-03 (×4): qty 2

## 2017-08-03 MED ORDER — ALBUTEROL SULFATE (2.5 MG/3ML) 0.083% IN NEBU: 2.5000 mg | INHALATION_SOLUTION | RESPIRATORY_TRACT | Status: DC | PRN

## 2017-08-03 MED ORDER — METHYLPREDNISOLONE SODIUM SUCC 125 MG IJ SOLR
125.0000 mg | Freq: Once | INTRAMUSCULAR | Status: AC
  Administered 2017-08-03: 125 mg via INTRAVENOUS
  Filled 2017-08-03: qty 2

## 2017-08-03 MED ORDER — GABAPENTIN 300 MG PO CAPS: 300.0000 mg | ORAL_CAPSULE | Freq: Four times a day (QID) | ORAL | Status: DC

## 2017-08-03 MED ORDER — ALBUTEROL SULFATE (2.5 MG/3ML) 0.083% IN NEBU: 2.5000 mg | INHALATION_SOLUTION | RESPIRATORY_TRACT | Status: DC

## 2017-08-03 MED ORDER — APIXABAN 5 MG PO TABS
5.0000 mg | ORAL_TABLET | Freq: Two times a day (BID) | ORAL | Status: DC
  Administered 2017-08-03 – 2017-08-07 (×8): 5 mg via ORAL
  Filled 2017-08-03 (×8): qty 1

## 2017-08-03 MED ORDER — ALBUTEROL SULFATE (2.5 MG/3ML) 0.083% IN NEBU
5.0000 mg | INHALATION_SOLUTION | Freq: Once | RESPIRATORY_TRACT | Status: AC
  Administered 2017-08-03: 5 mg via RESPIRATORY_TRACT
  Filled 2017-08-03: qty 6

## 2017-08-03 MED ORDER — IPRATROPIUM BROMIDE 0.02 % IN SOLN
0.5000 mg | Freq: Once | RESPIRATORY_TRACT | Status: AC
  Administered 2017-08-03: 0.5 mg via RESPIRATORY_TRACT
  Filled 2017-08-03: qty 2.5

## 2017-08-03 MED ORDER — ALBUTEROL (5 MG/ML) CONTINUOUS INHALATION SOLN
10.0000 mg/h | INHALATION_SOLUTION | RESPIRATORY_TRACT | Status: DC
  Administered 2017-08-03: 10 mg/h via RESPIRATORY_TRACT
  Filled 2017-08-03: qty 20

## 2017-08-03 MED ORDER — IPRATROPIUM-ALBUTEROL 0.5-2.5 (3) MG/3ML IN SOLN
3.0000 mL | Freq: Three times a day (TID) | RESPIRATORY_TRACT | Status: DC
  Administered 2017-08-04 – 2017-08-07 (×9): 3 mL via RESPIRATORY_TRACT
  Filled 2017-08-03 (×9): qty 3

## 2017-08-03 MED ORDER — PANTOPRAZOLE SODIUM 40 MG PO TBEC
40.0000 mg | DELAYED_RELEASE_TABLET | Freq: Every day | ORAL | Status: DC
  Administered 2017-08-04 – 2017-08-07 (×4): 40 mg via ORAL
  Filled 2017-08-03 (×4): qty 1

## 2017-08-03 MED ORDER — DIAZEPAM 5 MG PO TABS
5.0000 mg | ORAL_TABLET | Freq: Two times a day (BID) | ORAL | Status: DC | PRN
  Administered 2017-08-04 – 2017-08-07 (×7): 5 mg via ORAL
  Filled 2017-08-03 (×7): qty 1

## 2017-08-03 MED ORDER — MAGNESIUM SULFATE 2 GM/50ML IV SOLN
2.0000 g | Freq: Once | INTRAVENOUS | Status: AC
  Administered 2017-08-03: 2 g via INTRAVENOUS
  Filled 2017-08-03: qty 50

## 2017-08-03 MED ORDER — ALBUTEROL SULFATE HFA 108 (90 BASE) MCG/ACT IN AERS: 2.0000 | INHALATION_SPRAY | RESPIRATORY_TRACT | Status: DC | PRN

## 2017-08-03 MED ORDER — GABAPENTIN 300 MG PO CAPS
300.0000 mg | ORAL_CAPSULE | Freq: Two times a day (BID) | ORAL | Status: DC
  Administered 2017-08-04 – 2017-08-07 (×7): 300 mg via ORAL
  Filled 2017-08-03 (×7): qty 1

## 2017-08-03 MED ORDER — BENZONATATE 100 MG PO CAPS
100.0000 mg | ORAL_CAPSULE | Freq: Three times a day (TID) | ORAL | Status: DC
  Administered 2017-08-03 – 2017-08-07 (×11): 100 mg via ORAL
  Filled 2017-08-03 (×11): qty 1

## 2017-08-03 MED ORDER — LISINOPRIL 20 MG PO TABS
40.0000 mg | ORAL_TABLET | Freq: Every day | ORAL | Status: DC
  Filled 2017-08-03: qty 2

## 2017-08-03 MED ORDER — LEVOFLOXACIN IN D5W 750 MG/150ML IV SOLN
750.0000 mg | Freq: Once | INTRAVENOUS | Status: AC
  Administered 2017-08-03: 750 mg via INTRAVENOUS
  Filled 2017-08-03: qty 150

## 2017-08-03 MED ORDER — MIRABEGRON ER 25 MG PO TB24
50.0000 mg | ORAL_TABLET | Freq: Every day | ORAL | Status: DC
  Administered 2017-08-03 – 2017-08-07 (×5): 50 mg via ORAL
  Filled 2017-08-03 (×5): qty 2

## 2017-08-03 MED ORDER — IPRATROPIUM-ALBUTEROL 0.5-2.5 (3) MG/3ML IN SOLN: 3.0000 mL | Freq: Three times a day (TID) | RESPIRATORY_TRACT | Status: DC

## 2017-08-03 MED ORDER — IPRATROPIUM-ALBUTEROL 0.5-2.5 (3) MG/3ML IN SOLN
3.0000 mL | RESPIRATORY_TRACT | Status: DC
  Administered 2017-08-03: 3 mL via RESPIRATORY_TRACT
  Filled 2017-08-03: qty 3

## 2017-08-03 MED ORDER — METHYLPREDNISOLONE SODIUM SUCC 125 MG IJ SOLR
60.0000 mg | Freq: Four times a day (QID) | INTRAMUSCULAR | Status: DC
  Administered 2017-08-04 – 2017-08-06 (×10): 60 mg via INTRAVENOUS
  Filled 2017-08-03 (×10): qty 2

## 2017-08-03 MED ORDER — LEVOFLOXACIN 500 MG PO TABS
500.0000 mg | ORAL_TABLET | Freq: Every day | ORAL | Status: DC
  Administered 2017-08-04 – 2017-08-06 (×3): 500 mg via ORAL
  Filled 2017-08-03 (×3): qty 1

## 2017-08-03 MED ORDER — MIRTAZAPINE 30 MG PO TABS
45.0000 mg | ORAL_TABLET | Freq: Every day | ORAL | Status: DC
  Administered 2017-08-03 – 2017-08-06 (×4): 45 mg via ORAL
  Filled 2017-08-03 (×4): qty 2

## 2017-08-03 NOTE — ED Triage Notes (Signed)
Per [facility] patient from medical center for SOB, cough sometimes productive that has been on going for weeks..Brenda Dunn

## 2017-08-03 NOTE — Progress Notes (Signed)
   Subjective:    Patient ID: Elisha PonderSonja E Bouley, female    DOB: 10/01/1950, 67 y.o.   MRN: [id number]  HPI Chief Complaint  Patient presents with  . Acute Visit    same thing as before, coughing, wheezing    She is a 67 year old female with a history of COPD and recent diagnosis of PE who is here with complaints of shortness of breath, coughing and wheezing. She is visibly in distress and unable to speak in complete sentences.   She is currently on doxycycline. Reports good compliance with Eliquis.   Completed oral steroids 2-3 weeks ago and states will not take steroids again.     Review of Systems Pertinent positives and negatives in the history of present illness.     Objective:   Physical Exam  Constitutional: She is oriented to person, place, and time. She appears well-developed and well-nourished. No distress.  Cardiovascular: Normal rate, regular rhythm and normal heart sounds.  Pulmonary/Chest: Accessory muscle usage present. She is in respiratory distress. She has wheezes. She has rhonchi.  Unable to speak in full sentences. Labored breathing   Neurological: She is alert and oriented to person, place, and time. No cranial nerve deficit.  Skin: Skin is warm and dry.   BP 124/72 (BP Location: Right Arm, Patient Position: Sitting)   Pulse 86   Temp 98.5 F (36.9 C) (Oral)   Resp (!) 26   Ht 4' 11.5" (1.511 m)   Wt 137 lb (62.1 kg)   SpO2 (!) 84%   BMI 27.21 kg/m       Assessment & Plan:  Hypoxic  COPD with acute exacerbation (HCC)  Tachypnea  Smoker  Chronic anticoagulation  She is in acute respiratory distress with SpO2 84%. Patient is asking for oxygen. Plan to send her to the ED via ambulance.

## 2017-08-03 NOTE — H&P (Signed)
History and Physical    Brenda Dunn ZDG:[id number] DOB: 1950/11/23 DOA: 08/03/2017  PCP: Avanell Shackleton, NP-CPatient coming from: home.  I have personally briefly reviewed patient's old medical records in [facility]  Chief Complaint: cant breathe  HPI: Brenda Dunn is a 67 y.o. female with medical history significant of copd,recent PE admitted with increasing shortness of breath.  She has a past medical history significant for hypertension hyperlipidemia history of PE on Eliquis COPD, hypothyroidism, admitted with shortness of breath wheezing and cough for 1 week.  She saw her primary doctor as an outpatient who prescribed her doxycycline and Deltasone.  Patient reports that she took doxycycline but she did not take Deltasone as she does not like the way it makes her feel.  She was hospitalized last month for acute pulmonary embolism and discharged on Eliquis she reports that she has been taking Eliquis faithfully.  Before that she was hospitalized in October with possible pneumonia and she reports that she has been taking a lot of antibiotics in the past few months.  Her oxygen saturation was 84% with PCP office.  She was placed on 2 L of oxygen and his saturation went up.  She denies any fever chills chest pain nausea vomiting or diarrhea.  She continues to smoke apparently she is cut down her smoking to 1 pack/week. ED Course: She received IV Solu-Medrol Levaquin and continued nebulizer treatment and magnesium sulfate.  Her saturation drops with exertion to the 80s.  Review of Systems: As per HPI otherwise 10 point review of systems negative.   Past Medical History:  Diagnosis Date  . Anxiety   . Benzodiazepine dependence (HCC)   . Colonic polyp    mulitple polyps. repeat in 05/2019  . Constipation    IBS  . COPD (chronic obstructive pulmonary disease) (HCC)   . Depression   . GERD (gastroesophageal reflux disease)   . Hyperlipidemia   . Hypertension   . Insomnia   . Migraine  headache   . OAB (overactive bladder)   . Osteoarthritis   . Post-surgical hypothyroidism   . Scoliosis     Past Surgical History:  Procedure Laterality Date  . HEMORRHOID SURGERY    . THYROIDECTOMY     at [facility]  . TUBAL LIGATION  1979     reports that she has been smoking cigarettes.  She has a 66.00 pack-year smoking history. she has never used smokeless tobacco. She reports that she does not drink alcohol or use drugs.  No Known Allergies  Family History  Problem Relation Age of Onset  . Breast cancer Mother   . Lung cancer Maternal Uncle   . Lung cancer Maternal Grandmother    Unacceptable: Noncontributory, unremarkable, or negative. Acceptable: Family history reviewed and not pertinent (If you reviewed it)  Prior to Admission medications   Medication Sig Start Date End Date Taking? Authorizing Provider  albuterol (PROVENTIL HFA;VENTOLIN HFA) 108 (90 Base) MCG/ACT inhaler Inhale 2 puffs into the lungs every 4 (four) hours as needed for wheezing or shortness of breath. 07/16/17  Yes Rolly Salter, MD  apixaban (ELIQUIS) 5 MG TABS tablet Take 10 mg twice a day till 07/21/2017, start taking 5 mg twice a day from 07/22/2017 Patient taking differently: Take 5 mg by mouth 2 (two) times daily.  07/16/17  Yes Rolly Salter, MD  atorvastatin (LIPITOR) 40 MG tablet Take 40 mg by mouth daily.   Yes [provider]  benzonatate (TESSALON) 100  MG capsule Take 1 capsule (100 mg total) by mouth 3 (three) times daily. 08/02/17  Yes Henson, [name], [name]  chlorpheniramine-HYDROcodone (TUSSIONEX) 10-8 MG/5ML SUER Take 5 mLs by mouth every 12 (twelve) hours as needed for cough. 07/16/17  Yes Rolly Salter, MD  dextromethorphan-guaiFENesin [facility] DM) 30-600 MG 12hr tablet Take 1 tablet by mouth 2 (two) times daily. 07/16/17  Yes Rolly Salter, MD  diazepam (VALIUM) 5 MG tablet Take 5 mg by mouth. Take 1 tablet in the am, 1/2 tablet at lunch and 1 tablet at night   Yes [provider]  Docusate Calcium (STOOL SOFTENER PO) Take 3 tablets by mouth daily.   Yes [provider]  doxycycline (VIBRA-TABS) 100 MG tablet Take 1 tablet (100 mg total) by mouth 2 (two) times daily. 07/28/17  Yes Henson, [name], [name]  gabapentin (NEURONTIN) 300 MG capsule Take 300 mg by mouth 4 (four) times daily. 300 MG IN THE MORNING, 300 MG AT LUNCH AND 600 MG AT BEDTIME   Yes [provider]  ipratropium-albuterol (DUONEB) 0.5-2.5 (3) MG/3ML SOLN Take 3 mLs by nebulization 3 (three) times daily. 07/16/17  Yes Rolly Salter, MD  levothyroxine (SYNTHROID, LEVOTHROID) 88 MCG tablet TAKE 1 TABLET EVERY DAY ON EMPTY STOMACH 07/31/17  Yes [provider]  LINZESS 290 MCG CAPS capsule TAKE 1 CAPSULE BY MOUTH ONCE A DAY 06/12/17  Yes Henson, [name], [name]  lisinopril (PRINIVIL,ZESTRIL) 40 MG tablet Take 40 mg by mouth daily.   Yes [provider]  mirtazapine (REMERON) 45 MG tablet Take 45 mg by mouth at bedtime.   Yes [provider]  MYRBETRIQ 50 MG TB24 tablet TAKE 1 TABLET BY MOUTH EVERY DAY 07/06/17  Yes Henson, [name], [name]  omeprazole (PRILOSEC) 40 MG capsule Take 40 mg by mouth daily.   Yes [provider]  OXcarbazepine (TRILEPTAL) 150 MG tablet Take 450 mg by mouth at bedtime.   Yes [provider]  levofloxacin (LEVAQUIN) 500 MG tablet Take 1 tablet (500 mg total) by mouth daily. Patient not taking: Reported on 08/03/2017 07/21/17   Ronnald Nian, MD  predniSONE (DELTASONE) 10 MG tablet Take 30mg  daily for 3days,Take 20mg  daily for 3days,Take 10mg  daily for 3days, then stop. Patient not taking: Reported on 08/03/2017 07/16/17   Rolly Salter, MD  Tiotropium Bromide-Olodaterol (STIOLTO RESPIMAT) 2.5-2.5 MCG/ACT AERS Inhale 2 puffs into the lungs daily. Patient not taking: Reported on 08/03/2017 07/16/17   Rolly Salter, MD    Physical Exam: Vitals:   08/03/17 1231 08/03/17 1441 08/03/17 1640 08/03/17 1704  BP: (!)  172/102 122/74  118/71  Pulse:  85  80  Resp:  17  18  Temp:      TempSrc:      SpO2: 90% 95% 98% 100%  Weight:      Height:        Constitutional: NAD, calm, comfortable Vitals:   08/03/17 1231 08/03/17 1441 08/03/17 1640 08/03/17 1704  BP: (!) 172/102 122/74  118/71  Pulse:  85  80  Resp:  17  18  Temp:      TempSrc:      SpO2: 90% 95% 98% 100%  Weight:      Height:       Eyes: PERRL, lids and conjunctivae normal ENMT: Mucous membranes are moist. Posterior pharynx clear of any exudate or lesions.Normal dentition.  Neck: normal, supple, no masses, no thyromegaly Respiratory: Coarse breath sounds and wheezing  bilaterally, no crackles. Normal respiratory effort. No accessory muscle use.  Cardiovascular: Regular rate and rhythm, no murmurs / rubs / gallops. No extremity edema. 2+ pedal pulses. No carotid bruits.  Abdomen: no tenderness, no masses palpated. No hepatosplenomegaly. Bowel sounds positive.  Musculoskeletal: no clubbing / cyanosis. No joint deformity upper and lower extremities. Good ROM, no contractures. Normal muscle tone.  Skin: no rashes, lesions, ulcers. No induration Neurologic: CN 2-12 grossly intact. Sensation intact, DTR normal. Strength 5/5 in all 4.  Psychiatric: Normal judgment and insight. Alert and oriented x 3. Normal mood.   Labs on Admission: I have personally reviewed following labs and imaging studies  CBC: Recent Labs  Lab 08/03/17 1428  WBC 9.6  NEUTROABS 4.8  HGB 12.3  HCT 37.6  MCV 95.4  PLT 206   Basic Metabolic Panel: Recent Labs  Lab 08/03/17 1428  NA 133*  K 3.9  CL 98*  CO2 26  GLUCOSE 89  BUN 13  CREATININE 0.83  CALCIUM 9.1   GFR: Estimated Creatinine Clearance: 54.9 mL/min (by C-G formula based on SCr of 0.83 mg/dL). Liver Function Tests: No results for input(s): AST, ALT, ALKPHOS, BILITOT, PROT, ALBUMIN in the last 168 hours. No results for input(s): LIPASE, AMYLASE in the last 168 hours. No results for input(s):  AMMONIA in the last 168 hours. Coagulation Profile: No results for input(s): INR, PROTIME in the last 168 hours. Cardiac Enzymes: No results for input(s): CKTOTAL, CKMB, CKMBINDEX, TROPONINI in the last 168 hours. BNP (last 3 results) No results for input(s): PROBNP in the last 8760 hours. HbA1C: No results for input(s): HGBA1C in the last 72 hours. CBG: No results for input(s): GLUCAP in the last 168 hours. Lipid Profile: No results for input(s): CHOL, HDL, LDLCALC, TRIG, CHOLHDL, LDLDIRECT in the last 72 hours. Thyroid Function Tests: No results for input(s): TSH, T4TOTAL, FREET4, T3FREE, THYROIDAB in the last 72 hours. Anemia Panel: No results for input(s): VITAMINB12, FOLATE, FERRITIN, TIBC, IRON, RETICCTPCT in the last 72 hours. Urine analysis:    Component Value Date/Time   COLORURINE STRAW (A) 07/14/2017 2110   APPEARANCEUR CLEAR 07/14/2017 2110   LABSPEC 1.005 07/14/2017 2110   LABSPEC 1.020 05/18/2017 1402   PHURINE 5.0 07/14/2017 2110   GLUCOSEU NEGATIVE 07/14/2017 2110   HGBUR NEGATIVE 07/14/2017 2110   BILIRUBINUR NEGATIVE 07/14/2017 2110   BILIRUBINUR negative 05/18/2017 1402   KETONESUR NEGATIVE 07/14/2017 2110   PROTEINUR NEGATIVE 07/14/2017 2110   NITRITE NEGATIVE 07/14/2017 2110   LEUKOCYTESUR NEGATIVE 07/14/2017 2110    Radiological Exams on Admission: Dg Chest 2 View  Result Date: 08/03/2017 CLINICAL DATA:  Productive cough. EXAM: CHEST  2 VIEW COMPARISON:  CT 07/15/2017.  Chest x-ray 07/14/2017 . FINDINGS: Mediastinum and hilar structures normal. Interval near complete clearing of bibasilar atelectasis. No focal alveolar infiltrate. No pleural effusion or pneumothorax. Degenerative changes thoracic spine. IMPRESSION: No focal alveolar infiltrate. Interim near complete clearing of previously identified bibasilar atelectasis . Electronically Signed   By: Maisie Fushomas  Register   On: 08/03/2017 14:11    EKG: Independently reviewed.  Assessment/Plan Active  Problems:   COPD exacerbation (HCC) Acute COPD exacerbation in a patient with ongoing smoking.  Patient has had multiple hospital admissions for the same however she continues to smoke.  I will treated with IV Solu-Medrol and IV Levaquin and taper the dose as possible continue as needed oxygen and SVN every 4.  Start Protonix 40 mg daily.  Chest x-raytoday shows actually looks better than 2 weeks  ago.  With no evidence of inflammatory infiltrates.  Recent acute pulmonary embolism restart L Eliquis 5 mg twice a day.  History of anxiety with benzodiazepine dependence I will restart her home dose of Valium.  Constipation continue Linzess  Insomnia I will continue her Remeron.     DVT prophylaxis Eliquis Code Status: Full code Family Communication husband in the room discussed with him Disposition Plan: TBD none Consults called: Admission status: Inpatient  Alwyn Ren MD [facility]  If 7PM-7AM, please contact night-coverage [email or web address] Password [facility]  08/03/2017, 6:24 PM

## 2017-08-03 NOTE — ED Notes (Signed)
ED TO INPATIENT HANDOFF REPORT  Name/Age/Gender Brenda Dunn 67 y.o. female  Code Status    Code Status Orders  (From admission, onward)        Start     Ordered   08/03/17 1810  Full code  Continuous     08/03/17 1810    Code Status History    Date Active Date Inactive Code Status Order ID Comments User Context   07/14/2017 22:07 07/16/2017 17:25 Full Code [id number]  Rise Patience, MD Inpatient   05/28/2017 19:51 05/30/2017 18:36 Full Code [id number]  Karmen Bongo, MD Inpatient      Home/SNF/Other Home  Chief Complaint SOB  Level of Care/Admitting Diagnosis ED Disposition    ED Disposition Condition [place]: [facility] [[id number]]  Level of Care: Med-Surg [16]  Diagnosis: COPD exacerbation [facility]) [[id number]]  Admitting Physician: Georgette Shell [[id number]]  Attending Physician: Georgette Shell [phone]  Estimated length of stay: 3 - 4 days  Certification:: I certify this patient will need inpatient services for at least 2 midnights  PT Class (Do Not Modify): Inpatient [101]  PT Acc Code (Do Not Modify): Private [1]       Medical History Past Medical History:  Diagnosis Date  . Anxiety   . Benzodiazepine dependence ([place])   . Colonic polyp    mulitple polyps. repeat in 05/2019  . Constipation    IBS  . COPD (chronic obstructive pulmonary disease) ([place])   . Depression   . GERD (gastroesophageal reflux disease)   . Hyperlipidemia   . Hypertension   . Insomnia   . Migraine headache   . OAB (overactive bladder)   . Osteoarthritis   . Post-surgical hypothyroidism   . Scoliosis     Allergies No Known Allergies  IV Location/Drains/Wounds Patient Lines/Drains/Airways Status   Active Line/Drains/Airways    None          Labs/Imaging Results for orders placed or performed during the hospital encounter of 08/03/17 (from the past 48 hour(s))  CBC with Differential     Status: Abnormal   Collection Time: 08/03/17  2:28 PM  Result Value Ref [place]   WBC 9.6 4.0 - 10.5 K/uL   RBC 3.94 3.87 - 5.11 MIL/uL   Hemoglobin 12.3 12.0 - 15.0 g/dL   HCT 37.6 36.0 - 46.0 %   MCV 95.4 78.0 - 100.0 fL   MCH 31.2 26.0 - 34.0 pg   MCHC 32.7 30.0 - 36.0 g/dL   RDW 13.3 11.5 - 15.5 %   Platelets 206 150 - 400 K/uL   Neutrophils Relative % 50 %   Neutro Abs 4.8 1.7 - 7.7 K/uL   Lymphocytes Relative 27 %   Lymphs Abs 2.6 0.7 - 4.0 K/uL   Monocytes Relative 7 %   Monocytes Absolute 0.7 0.1 - 1.0 K/uL   Eosinophils Relative 16 %   Eosinophils Absolute 1.6 (H) 0.0 - 0.7 K/uL   Basophils Relative 0 %   Basophils Absolute 0.0 0.0 - 0.1 K/uL  Basic metabolic panel     Status: Abnormal   Collection Time: 08/03/17  2:28 PM  Result Value Ref [place]   Sodium 133 (L) 135 - 145 mmol/L   Potassium 3.9 3.5 - 5.1 mmol/L   Chloride 98 (L) 101 - 111 mmol/L   CO2 26 22 - 32 mmol/L   Glucose, Bld 89 65 - 99 mg/dL   BUN 13 6 - 20 mg/dL  Creatinine, Ser 0.83 0.44 - 1.00 mg/dL   Calcium 9.1 8.9 - 10.3 mg/dL   GFR calc non Af Amer >60 >60 mL/min   GFR calc Af Amer >60 >60 mL/min    Comment: (NOTE) The eGFR has been calculated using the CKD EPI equation. This calculation has not been validated in all clinical situations. eGFR's persistently <60 mL/min signify possible Chronic Kidney Disease.    Anion gap 9 5 - 15  I-stat troponin, ED     Status: None   Collection Time: 08/03/17  3:09 PM  Result Value Ref [place]   Troponin i, poc 0.00 0.00 - 0.08 ng/mL   Comment 3            Comment: Due to the release kinetics of cTnI, a negative result within the first hours of the onset of symptoms does not rule out myocardial infarction with certainty. If myocardial infarction is still suspected, repeat the test at appropriate intervals.    Dg Chest 2 View  Result Date: 08/03/2017 CLINICAL DATA:  Productive cough. EXAM: CHEST  2 VIEW COMPARISON:  CT 07/15/2017.  Chest x-ray 07/14/2017 . FINDINGS:  Mediastinum and hilar structures normal. Interval near complete clearing of bibasilar atelectasis. No focal alveolar infiltrate. No pleural effusion or pneumothorax. Degenerative changes thoracic spine. IMPRESSION: No focal alveolar infiltrate. Interim near complete clearing of previously identified bibasilar atelectasis . Electronically Signed   By: Marcello Moores  Register   On: 08/03/2017 14:11    Pending Labs Unresulted Labs (From admission, onward)   None      Vitals/Pain Today's Vitals   08/03/17 1231 08/03/17 1441 08/03/17 1640 08/03/17 1704  BP: (!) 172/102 122/74  118/71  Pulse:  85  80  Resp:  17  18  Temp:      TempSrc:      SpO2: 90% 95% 98% 100%  Weight:      Height:        Isolation Precautions No active isolations  Medications Medications  albuterol (PROVENTIL,VENTOLIN) solution continuous neb (10 mg/hr Nebulization New Bag/Given 08/03/17 1640)  levofloxacin (LEVAQUIN) tablet 500 mg (not administered)  apixaban (ELIQUIS) tablet 5 mg (not administered)  atorvastatin (LIPITOR) tablet 40 mg (not administered)  benzonatate (TESSALON) capsule 100 mg (not administered)  chlorpheniramine-HYDROcodone (TUSSIONEX) 10-8 MG/5ML suspension 5 mL (not administered)  diazepam (VALIUM) tablet 5 mg (not administered)  dextromethorphan-guaiFENesin (MUCINEX DM) 30-600 MG per 12 hr tablet 1 tablet (not administered)  gabapentin (NEURONTIN) capsule 300 mg (not administered)  levothyroxine (SYNTHROID, LEVOTHROID) tablet 88 mcg (not administered)  ipratropium-albuterol (DUONEB) 0.5-2.5 (3) MG/3ML nebulizer solution 3 mL (not administered)  linaclotide (LINZESS) capsule 290 mcg (not administered)  lisinopril (PRINIVIL,ZESTRIL) tablet 40 mg (not administered)  mirtazapine (REMERON) tablet 45 mg (not administered)  mirabegron ER (MYRBETRIQ) tablet 50 mg (not administered)  OXcarbazepine (TRILEPTAL) tablet 450 mg (not administered)  albuterol (PROVENTIL) (2.5 MG/3ML) 0.083% nebulizer solution 2.5  mg (not administered)  methylPREDNISolone sodium succinate (SOLU-MEDROL) 125 mg/2 mL injection 60 mg (not administered)  pantoprazole (PROTONIX) EC tablet 40 mg (not administered)  albuterol (PROVENTIL) (2.5 MG/3ML) 0.083% nebulizer solution 5 mg (5 mg Nebulization Given 08/03/17 1235)  methylPREDNISolone sodium succinate (SOLU-MEDROL) 125 mg/2 mL injection 125 mg (125 mg Intravenous Given 08/03/17 1513)  albuterol (PROVENTIL) (2.5 MG/3ML) 0.083% nebulizer solution 5 mg (5 mg Nebulization Given 08/03/17 1452)  ipratropium (ATROVENT) nebulizer solution 0.5 mg (0.5 mg Nebulization Given 08/03/17 1545)  magnesium sulfate IVPB 2 g 50 mL (0 g Intravenous Stopped 08/03/17 1603)  levofloxacin (LEVAQUIN) IVPB 750 mg (0 mg Intravenous Stopped 08/03/17 1739)  ipratropium (ATROVENT) nebulizer solution 0.5 mg (0.5 mg Nebulization Given 08/03/17 1640)    Mobility walks

## 2017-08-03 NOTE — Progress Notes (Signed)
Admitted to room 1339 from er.

## 2017-08-03 NOTE — ED Provider Notes (Signed)
[facility]-EMERGENCY DEPT Provider Note   CSN: [id number] Arrival date & time: 08/03/17  1214     History   Chief Complaint Chief Complaint  Patient presents with  . Shortness of Breath    HPI Brenda Dunn is a 67 y.o. female with a PMHx of COPD, HTN, HLD, hypothyroidism, PE on eliquis, and other conditions listed below, who presents to the ED with complaints of gradually worsening SOB, wheezing, and cough x1 wk. Pt states that ever since her PNA in October, she has had to use her nebulizer and inhalers more often, and never fully recovered from the PNA. Chart review reveals that she was hospitalized for COPD exacerbation and acute LLL PE on 12/14-16/18, discharged on eliquis.  She states that she was doing slightly better after leaving the hospital, however on 07/26/17 she went to the beach and developed a cough and worsening symptoms again.  She called her PCP on 07/28/17 and received a doxycycline 100mg  BID rx which she started taking.  She was seen again today at her PCPs office and found to be hypoxic at 84% on RA, so she was transported here for further work up.  She states that her cough is productive of greenish yellowish sputum, and that when she coughs really hard it hurts in the center of her chest but only with coughing.  She reports associated shortness of breath and wheezing.  She has been using her inhalers, nebulizers Perles, and over-the-counter cold treatment with no relief of symptoms, no known aggravating factors.  She denies any known sick contacts.  She is compliant with her Eliquis.  She continues to smoke cigarettes however she is down to 1 pack/week.  She denies any fevers, chills, hemoptysis, leg swelling, abdominal pain, nausea, vomiting, diarrhea, constipation, dysuria, hematuria, myalgias, arthralgias, numbness, tingling, focal weakness, or any other complaints at this time.   The history is provided by the patient and medical records. No  language interpreter was used.  Shortness of Breath  This is a recurrent problem. The average episode lasts 1 week. The problem occurs continuously.The current episode started more than 2 days ago. The problem has been gradually worsening. Associated symptoms include cough, sputum production, wheezing and chest pain (only with coughing). Pertinent negatives include no fever, no hemoptysis, no vomiting, no abdominal pain and no leg swelling. It is unknown what precipitated the problem. Risk factors include smoking. She has tried beta-agonist inhalers (and doxycycline, OTC cold tx, and tessalon perles) for the symptoms. The treatment provided no relief. She has had prior hospitalizations. She has had prior ED visits. Associated medical issues include COPD and PE.    Past Medical History:  Diagnosis Date  . Anxiety   . Benzodiazepine dependence (HCC)   . Colonic polyp    mulitple polyps. repeat in 05/2019  . Constipation    IBS  . COPD (chronic obstructive pulmonary disease) (HCC)   . Depression   . GERD (gastroesophageal reflux disease)   . Hyperlipidemia   . Hypertension   . Insomnia   . Migraine headache   . OAB (overactive bladder)   . Osteoarthritis   . Post-surgical hypothyroidism   . Scoliosis     Patient Active Problem List   Diagnosis Date Noted  . Acute pulmonary embolism (HCC) 07/15/2017  . Elevated troponin   . Hyponatremia 07/14/2017  . Acute respiratory failure with hypoxia (HCC) 07/14/2017  . Solitary pulmonary nodule on lung CT 06/02/2017  . COPD exacerbation (HCC) 05/28/2017  .  Acute on chronic respiratory failure (HCC) 05/28/2017  . Hypo-osmolality and hyponatremia 05/18/2017  . Smoker 05/02/2017  . Essential hypertension   . GERD (gastroesophageal reflux disease)   . Anxiety   . Post-surgical hypothyroidism   . Hyperlipidemia   . Osteoarthritis   . Depression   . Constipation   . Scoliosis   . Migraine headache   . Benzodiazepine dependence (HCC)   .  COPD pfts pending    . Insomnia   . OAB (overactive bladder)   . Colonic polyp     Past Surgical History:  Procedure Laterality Date  . HEMORRHOID SURGERY    . THYROIDECTOMY     at [facility]  . TUBAL LIGATION  1979    OB History    No data available       Home Medications    Prior to Admission medications   Medication Sig Start Date End Date Taking? Authorizing Provider  albuterol (PROVENTIL HFA;VENTOLIN HFA) 108 (90 Base) MCG/ACT inhaler Inhale 2 puffs into the lungs every 4 (four) hours as needed for wheezing or shortness of breath. 07/16/17   Rolly Salter, MD  apixaban (ELIQUIS) 5 MG TABS tablet Take 10 mg twice a day till 07/21/2017, start taking 5 mg twice a day from 07/22/2017 07/16/17   Rolly Salter, MD  atorvastatin (LIPITOR) 40 MG tablet Take 40 mg by mouth daily.    [provider]  benzonatate (TESSALON) 100 MG capsule Take 1 capsule (100 mg total) by mouth 3 (three) times daily. 08/02/17   Henson, [name], [name]  chlorpheniramine-HYDROcodone (TUSSIONEX) 10-8 MG/5ML SUER Take 5 mLs by mouth every 12 (twelve) hours as needed for cough. 07/16/17   Rolly Salter, MD  dextromethorphan-guaiFENesin [facility] DM) 30-600 MG 12hr tablet Take 1 tablet by mouth 2 (two) times daily. 07/16/17   Rolly Salter, MD  diazepam (VALIUM) 5 MG tablet Take 5 mg by mouth. Take 1 tablet in the am, 1/2 tablet at lunch and 1 tablet at night    [provider]  Docusate Calcium (STOOL SOFTENER PO) Take 3 tablets by mouth daily.    [provider]  doxycycline (VIBRA-TABS) 100 MG tablet Take 1 tablet (100 mg total) by mouth 2 (two) times daily. Patient not taking: Reported on 08/03/2017 07/28/17   Hetty Blend L, [name]  gabapentin (NEURONTIN) 300 MG capsule Take 300 mg by mouth 3 (three) times daily.    [provider]  ipratropium-albuterol (DUONEB) 0.5-2.5 (3) MG/3ML SOLN Take 3 mLs by nebulization 3 (three) times daily. 07/16/17   Rolly Salter, MD    levofloxacin (LEVAQUIN) 500 MG tablet Take 1 tablet (500 mg total) by mouth daily. Patient not taking: Reported on 08/03/2017 07/21/17   Ronnald Nian, MD  levothyroxine (SYNTHROID, LEVOTHROID) 88 MCG tablet TAKE 1 TABLET EVERY DAY ON EMPTY STOMACH 07/31/17   [provider]  LINZESS 290 MCG CAPS capsule TAKE 1 CAPSULE BY MOUTH ONCE A DAY 06/12/17   Henson, [name], [name]  lisinopril (PRINIVIL,ZESTRIL) 40 MG tablet Take 40 mg by mouth daily.    [provider]  mirtazapine (REMERON) 45 MG tablet Take 45 mg by mouth at bedtime.    [provider]  MYRBETRIQ 50 MG TB24 tablet TAKE 1 TABLET BY MOUTH EVERY DAY 07/06/17   Henson, [name], [name]  omeprazole (PRILOSEC) 40 MG capsule Take 40 mg by mouth daily.    [provider]  OXcarbazepine (TRILEPTAL) 150 MG tablet Take 450 mg  by mouth at bedtime.    [provider]  predniSONE (DELTASONE) 10 MG tablet Take 30mg  daily for 3days,Take 20mg  daily for 3days,Take 10mg  daily for 3days, then stop. Patient not taking: Reported on 08/03/2017 07/16/17   Rolly Salter, MD  Tiotropium Bromide-Olodaterol (STIOLTO RESPIMAT) 2.5-2.5 MCG/ACT AERS Inhale 2 puffs into the lungs daily. 07/16/17   Rolly Salter, MD    Family History Family History  Problem Relation Age of Onset  . Breast cancer Mother   . Lung cancer Maternal Uncle   . Lung cancer Maternal Grandmother     Social History Social History   Tobacco Use  . Smoking status: Current Every Day Smoker    Packs/day: 1.50    Years: 44.00    Pack years: 66.00    Types: Cigarettes  . Smokeless tobacco: Never Used  Substance Use Topics  . Alcohol use: No  . Drug use: No     Allergies   Patient has no known allergies.   Review of Systems Review of Systems  Constitutional: Negative for chills and fever.  Respiratory: Positive for cough, sputum production, shortness of breath and wheezing. Negative for hemoptysis.        No hemoptysis   Cardiovascular: Positive for chest pain (only with coughing). Negative for leg swelling.  Gastrointestinal: Negative for abdominal pain, constipation, diarrhea, nausea and vomiting.  Genitourinary: Negative for dysuria and hematuria.  Musculoskeletal: Negative for arthralgias and myalgias.  Skin: Negative for color change.  Allergic/Immunologic: Negative for immunocompromised state.  Neurological: Negative for weakness and numbness.  Hematological: Bruises/bleeds easily (on eliquis).  Psychiatric/Behavioral: Negative for confusion.   All other systems reviewed and are negative for acute change except as noted in the HPI.    Physical Exam Updated Vital Signs BP (!) 172/102 (BP Location: Left Arm)   Pulse 92   Temp 98.1 F (36.7 C) (Oral)   Resp (!) 28   Ht 4' 11.5" (1.511 m)   Wt 62.1 kg (137 lb)   SpO2 90%   BMI 27.21 kg/m   Physical Exam  Constitutional: She is oriented to person, place, and time. Vital signs are normal. She appears well-developed and well-nourished.  Non-toxic appearance. No distress.  Afebrile, nontoxic, NAD  HENT:  Head: Normocephalic and atraumatic.  Mouth/Throat: Oropharynx is clear and moist and mucous membranes are normal.  Eyes: Conjunctivae and EOM are normal. Right eye exhibits no discharge. Left eye exhibits no discharge.  Neck: Normal range of motion. Neck supple.  Cardiovascular: Normal rate, regular rhythm, normal heart sounds and intact distal pulses. Exam reveals no gallop and no friction rub.  No murmur heard. RRR, nl s1/s2, no m/r/g, distal pulses intact, no pedal edema   Pulmonary/Chest: Accessory muscle usage present. Tachypnea noted. No respiratory distress. She has decreased breath sounds. She has wheezes. She has rhonchi. She has no rales. She exhibits no tenderness, no crepitus, no deformity and no retraction.  Globally diminished lung sounds throughout, with diffuse wheezing and scattered rhonchi throughout, harsh sounding cough during  exam. Slightly tachypneic and occasionally using some accessory muscles when she's trying to talk at the same time as coughing; no acute respiratory distress, but speaking in fragmented sentences and becomes hypoxic when speaking, down to 84% on RA. No rales appreciated. SpO2 90% on RA when NOT speaking.  Chest wall nonTTP without crepitus, deformities, or retractions   Abdominal: Soft. Normal appearance and bowel sounds are normal. She exhibits no distension. There is no tenderness. There is no  rigidity, no rebound, no guarding, no CVA tenderness, no tenderness at McBurney's point and negative Murphy's sign.  Musculoskeletal: Normal range of motion.  MAE x4 Strength and sensation grossly intact in all extremities Distal pulses intact Gait steady No pedal edema, neg homan's bilaterally   Neurological: She is alert and oriented to person, place, and time. She has normal strength. No sensory deficit.  Skin: Skin is warm, dry and intact. No rash noted.  Psychiatric: She has a normal mood and affect.  Nursing note and vitals reviewed.    ED Treatments / Results  Labs (all labs ordered are listed, but only abnormal results are displayed) Labs Reviewed  CBC WITH DIFFERENTIAL/PLATELET - Abnormal; Notable for the following components:      Result Value   Eosinophils Absolute 1.6 (*)    All other components within normal limits  BASIC METABOLIC PANEL - Abnormal; Notable for the following components:   Sodium 133 (*)    Chloride 98 (*)    All other components within normal limits  I-STAT TROPONIN, ED    EKG  EKG Interpretation  Date/Time:  Thursday August 03 2017 12:44:35 EST Ventricular Rate:  83 PR Interval:    QRS Duration: 108 QT Interval:  378 QTC Calculation: 445 R Axis:   -66 Text Interpretation:  Sinus rhythm Incomplete RBBB and LAFB Abnormal R-wave progression, late transition Confirmed by Benjiman Core [phone]) on 08/03/2017 3:04:23 PM       Radiology Dg Chest 2  View  Result Date: 08/03/2017 CLINICAL DATA:  Productive cough. EXAM: CHEST  2 VIEW COMPARISON:  CT 07/15/2017.  Chest x-ray 07/14/2017 . FINDINGS: Mediastinum and hilar structures normal. Interval near complete clearing of bibasilar atelectasis. No focal alveolar infiltrate. No pleural effusion or pneumothorax. Degenerative changes thoracic spine. IMPRESSION: No focal alveolar infiltrate. Interim near complete clearing of previously identified bibasilar atelectasis . Electronically Signed   By: Maisie Fus  Register   On: 08/03/2017 14:11    Procedures Procedures (including critical care time)  CRITICAL CARE-- hypoxia requiring multiple nebulizer tx Performed by: Rhona Raider   Total critical care time: 45 minutes  Critical care time was exclusive of separately billable procedures and treating other patients.  Critical care was necessary to treat or prevent imminent or life-threatening deterioration.  Critical care was time spent personally by me on the following activities: development of treatment plan with patient and/or surrogate as well as nursing, discussions with consultants, evaluation of patient's response to treatment, examination of patient, obtaining history from patient or surrogate, ordering and performing treatments and interventions, ordering and review of laboratory studies, ordering and review of radiographic studies, pulse oximetry and re-evaluation of patient's condition.   Medications Ordered in ED Medications  albuterol (PROVENTIL,VENTOLIN) solution continuous neb (10 mg/hr Nebulization New Bag/Given 08/03/17 1640)  albuterol (PROVENTIL) (2.5 MG/3ML) 0.083% nebulizer solution 5 mg (5 mg Nebulization Given 08/03/17 1235)  methylPREDNISolone sodium succinate (SOLU-MEDROL) 125 mg/2 mL injection 125 mg (125 mg Intravenous Given 08/03/17 1513)  albuterol (PROVENTIL) (2.5 MG/3ML) 0.083% nebulizer solution 5 mg (5 mg Nebulization Given 08/03/17 1452)  ipratropium (ATROVENT) nebulizer  solution 0.5 mg (0.5 mg Nebulization Given 08/03/17 1545)  magnesium sulfate IVPB 2 g 50 mL (0 g Intravenous Stopped 08/03/17 1603)  levofloxacin (LEVAQUIN) IVPB 750 mg (0 mg Intravenous Stopped 08/03/17 1739)  ipratropium (ATROVENT) nebulizer solution 0.5 mg (0.5 mg Nebulization Given 08/03/17 1640)     Initial Impression / Assessment and Plan / ED Course  I have reviewed the triage vital signs  and the nursing notes.  Pertinent labs & imaging results that were available during my care of the patient were reviewed by me and considered in my medical decision making (see chart for details).     66 y.o. female here with COPD exacerbation x1 wk. Had PNA in October and since then has declined and never really fully recovered; admitted in 12/14-16/2018 for COPD exacerbation and provoked PE, on eliquis; went home and was doing slightly better but then 07/26/17 she went to the beach and got sick again, coughing and SOB/wheezing. Had doxycycline called in on 07/28/17 and has been taking it with no relief. On exam, diffuse expiratory wheezing and scattered rhonchi, diminished lung sounds throughout, no rales appreciated, hypoxic when she talks down to 85%, speaking in fragmented sentences, no LE swelling or tachycardia. EKG without acute ischemic findings, similar to prior. CXR without focal infiltrate. Likely COPD exacerbation again. Will get basic labs, and give duoneb, Mg, and solumedrol then reassess. Will also tx with levaquin IV given failure after doxycycline. If unable to improve significantly, I anticipate needing repeat admission. Discussed case with my attending Dr. Rubin Payor who agrees with plan.   4:10 PM CBC w/diff WNL. BMP with stable chronically low Na at 133 and Cl at 98, otherwise WNL. Trop neg. Lung sounds slightly less diminished but still with diffuse wheezing and rhonchi. Hypoxia slightly improved at rest, but still gets down to ~90% when speaking. Will move on to CAT with additional atrovent  since she wasn't given that in her first neb tx (that will make 2 total duonebs, plus CAT) and see if that improves it; if not then will need to admit. Of note, long discussion had with pt regarding her smoking and the fact that it's directly related to why her lungs continue to have issues and she has repeated COPD exacerbation; cessation was strongly encouraged. Will reassess after next neb tx.   5:44 PM Lung sounds somewhat improved after CAT+atrovent, however still having wheezing in lower lung fields bilaterally. SpO2 still 94% just sitting there, however she has less work of breathing and appears much more comfortable. I suspect if we attempted ambulation, however, her sats would drop below 90%; given that she still has some wheezing despite multiple neb tx's, and still has sats <95% at rest, will proceed with admission for COPD exacerbation.   5:53 PM Dr. Jerolyn Center of [facility] returning page and will admit. Holding orders to be placed by admitting team. Please see their notes for further documentation of care. I appreciate their help with this pleasant pt's care. Pt stable at time of admission.    Final Clinical Impressions(s) / ED Diagnoses   Final diagnoses:  COPD exacerbation (HCC)  Acute respiratory failure with hypoxia (HCC)  Tobacco user  SOB (shortness of breath)  Wheezing    ED Discharge Orders    [address] [place], [place] 08/03/17 1753    Benjiman Core, MD 08/04/17 1447

## 2017-08-03 NOTE — ED Notes (Signed)
RN asked pt if she was smoking recently and pt reported "I am down to one pack a week. My smoking is not the problem. My problem is they are not giving me strong enough antibiotics to get rid of the problem. I have done told them that." RN asked pt what she meant and pt reported she had been seen multiple times for PE, pneumonia, and bronchitis and it is like it never goes away. Pt reports she also feels she was "discharged too early last time."

## 2017-08-03 NOTE — Patient Instructions (Signed)
Patient with a previous history of COPD having acute exacerbation. Recent diagnosis of PE and on Eliquis.   Pulse ox in office 84%.   She is on Doxycycline currently.

## 2017-08-04 MED ORDER — ACETAMINOPHEN 325 MG PO TABS
650.0000 mg | ORAL_TABLET | Freq: Four times a day (QID) | ORAL | Status: DC | PRN
  Administered 2017-08-04: 650 mg via ORAL
  Filled 2017-08-04: qty 2

## 2017-08-04 MED ORDER — LISINOPRIL 10 MG PO TABS: 10.0000 mg | ORAL_TABLET | Freq: Every day | ORAL | Status: DC

## 2017-08-04 MED ORDER — LISINOPRIL 5 MG PO TABS
2.5000 mg | ORAL_TABLET | Freq: Every day | ORAL | Status: DC
  Administered 2017-08-05 – 2017-08-07 (×3): 2.5 mg via ORAL
  Filled 2017-08-04 (×3): qty 1

## 2017-08-04 NOTE — Progress Notes (Signed)
PROGRESS NOTE    Brenda Dunn  ZOX:[id number] DOB: [birth year] DOA: 08/03/2017 PCP: Avanell Shackleton, [name]  Brief Narrative: 67 y.o. female with medical history significant of copd,recent PE admitted with increasing shortness of breath.  She has a past medical history significant for hypertension hyperlipidemia history of PE on Eliquis COPD, hypothyroidism, admitted with shortness of breath wheezing and cough for 1 week.  She saw her primary doctor as an outpatient who prescribed her doxycycline and Deltasone.  Patient reports that she took doxycycline but she did not take Deltasone as she does not like the way it makes her feel.  She was hospitalized last month for acute pulmonary embolism and discharged on Eliquis she reports that she has been taking Eliquis faithfully.  Before that she was hospitalized in October with possible pneumonia and she reports that she has been taking a lot of antibiotics in the past few months.  Her oxygen saturation was 84% with PCP office.  She was placed on 2 L of oxygen and his saturation went up.  She denies any fever chills chest pain nausea vomiting or diarrhea.  She continues to smoke apparently she is cut down her smoking to 1 pack/week. ED Course: She received IV Solu-Medrol Levaquin and continued nebulizer treatment and magnesium sulfate.  Her saturation drops with exertion to the 80s.   08/04/2017-She feels better than yesterday.still continues to cough.seen dr wert as an outpatient.has appointment to see drmannam in feb.still continues to smoke. Assessment & Plan:   Active Problems:   COPD exacerbation (HCC)  Acute COPD exacerbation in a patient with ongoing smoking.  Patient has had multiple hospital admissions for the same however she continues to smoke.  I will treated with IV Solu-Medrol and IV Levaquin and taper the dose as possible continue as needed oxygen and SVN every 4.  Start Protonix 40 mg daily.  Chest x-raytoday shows actually looks better than 2  weeks ago.  With no evidence of inflammatory infiltrates.  Recent acute pulmonary embolism dec /2018- restart L Eliquis 5 mg twice a day.  History of anxiety with benzodiazepine dependence I will restart her home dose of Valium.  Constipation continue Linzess  Insomnia I will continue her Remeron.  History of hypertension transient hypotension decrease the dose of ACE inhibitor.      DVT prophylaxis eliquis Code Status: Full code Family Communication: Discussed with patient while husband was on the phone on speaker. Disposition Plan: Hope to DC in 24-48 hours Consultants: None   Procedures: None    Antimicrobials: Levofloxacin   Subjective:no new complaints   Objective: Vitals:   08/04/17 0505 08/04/17 0801 08/04/17 1056 08/04/17 1105  BP: 129/74  (!) 76/27 (!) 104/50  Pulse: 94  95   Resp: 16     Temp: 98.9 F (37.2 C)     TempSrc: Oral     SpO2: 95% 93%    Weight:      Height:        Intake/Output Summary (Last 24 hours) at 08/04/2017 1438 Last data filed at 08/03/2017 1739 Gross per 24 hour  Intake 200 ml  Output -  Net 200 ml   Filed Weights   08/03/17 1230 08/03/17 2007  Weight: 62.1 kg (137 lb) 62.8 kg (138 lb 7.2 oz)    Examination:  General exam: Appears calm and comfortable  Respiratory system: RHONCHI DECREASED  auscultation. Respiratory effort normal. Cardiovascular system: S1 & S2 heard, RRR. No JVD, murmurs, rubs, gallops or clicks. No pedal  edema. Gastrointestinal system: Abdomen is nondistended, soft and nontender. No organomegaly or masses felt. Normal bowel sounds heard. Central nervous system: Alert and oriented. No focal neurological deficits. Extremities: Symmetric 5 x 5 power. Skin: No rashes, lesions or ulcers Psychiatry: Judgement and insight appear normal. Mood & affect appropriate.     Data Reviewed: I have personally reviewed following labs and imaging studies  CBC: Recent Labs  Lab 08/03/17 1428  WBC 9.6    NEUTROABS 4.8  HGB 12.3  HCT 37.6  MCV 95.4  PLT 206   Basic Metabolic Panel: Recent Labs  Lab 08/03/17 1428  NA 133*  K 3.9  CL 98*  CO2 26  GLUCOSE 89  BUN 13  CREATININE 0.83  CALCIUM 9.1   GFR: Estimated Creatinine Clearance: 55.3 mL/min (by C-G formula based on SCr of 0.83 mg/dL). Liver Function Tests: No results for input(s): AST, ALT, ALKPHOS, BILITOT, PROT, ALBUMIN in the last 168 hours. No results for input(s): LIPASE, AMYLASE in the last 168 hours. No results for input(s): AMMONIA in the last 168 hours. Coagulation Profile: No results for input(s): INR, PROTIME in the last 168 hours. Cardiac Enzymes: No results for input(s): CKTOTAL, CKMB, CKMBINDEX, TROPONINI in the last 168 hours. BNP (last 3 results) No results for input(s): PROBNP in the last 8760 hours. HbA1C: No results for input(s): HGBA1C in the last 72 hours. CBG: No results for input(s): GLUCAP in the last 168 hours. Lipid Profile: No results for input(s): CHOL, HDL, LDLCALC, TRIG, CHOLHDL, LDLDIRECT in the last 72 hours. Thyroid Function Tests: No results for input(s): TSH, T4TOTAL, FREET4, T3FREE, THYROIDAB in the last 72 hours. Anemia Panel: No results for input(s): VITAMINB12, FOLATE, FERRITIN, TIBC, IRON, RETICCTPCT in the last 72 hours. Sepsis Labs: No results for input(s): PROCALCITON, LATICACIDVEN in the last 168 hours.  No results found for this or any previous visit (from the past 240 hour(s)).       Radiology Studies: Dg Chest 2 View  Result Date: 08/03/2017 CLINICAL DATA:  Productive cough. EXAM: CHEST  2 VIEW COMPARISON:  CT 07/15/2017.  Chest x-ray 07/14/2017 . FINDINGS: Mediastinum and hilar structures normal. Interval near complete clearing of bibasilar atelectasis. No focal alveolar infiltrate. No pleural effusion or pneumothorax. Degenerative changes thoracic spine. IMPRESSION: No focal alveolar infiltrate. Interim near complete clearing of previously identified bibasilar  atelectasis . Electronically Signed   By: Maisie Fushomas  Register   On: 08/03/2017 14:11        Scheduled Meds: . apixaban  5 mg Oral BID  . atorvastatin  40 mg Oral q1800  . benzonatate  100 mg Oral TID  . dextromethorphan-guaiFENesin  1 tablet Oral BID  . gabapentin  300 mg Oral BID   And  . gabapentin  600 mg Oral QHS  . ipratropium-albuterol  3 mL Nebulization TID  . levofloxacin  500 mg Oral Daily  . levothyroxine  88 mcg Oral QAC breakfast  . linaclotide  290 mcg Oral QAC breakfast  . [START ON 08/05/2017] lisinopril  10 mg Oral Daily  . methylPREDNISolone (SOLU-MEDROL) injection  60 mg Intravenous Q6H  . mirabegron ER  50 mg Oral Daily  . mirtazapine  45 mg Oral QHS  . OXcarbazepine  450 mg Oral QHS  . pantoprazole  40 mg Oral Daily   Continuous Infusions:   LOS: 1 day    Alwyn RenElizabeth G Mathews, MD [facility]  If 7PM-7AM, please contact night-coverage [email or web address] Password [email or web address] 08/04/2017, 2:38 PM

## 2017-08-05 MED ORDER — GUAIFENESIN-CODEINE 100-10 MG/5ML PO SOLN: 5.0000 mL | Freq: Four times a day (QID) | ORAL | Status: DC | PRN

## 2017-08-05 MED ORDER — ACETYLCYSTEINE 20 % IN SOLN
2.0000 mL | Freq: Three times a day (TID) | RESPIRATORY_TRACT | Status: AC
  Administered 2017-08-06 (×2): 2 mL via RESPIRATORY_TRACT
  Filled 2017-08-05 (×4): qty 4

## 2017-08-05 MED ORDER — ALUM & MAG HYDROXIDE-SIMETH 200-200-20 MG/5ML PO SUSP
15.0000 mL | Freq: Four times a day (QID) | ORAL | Status: DC | PRN
  Administered 2017-08-06: 15 mL via ORAL
  Filled 2017-08-05: qty 30

## 2017-08-05 MED ORDER — ACETYLCYSTEINE 10 % IN SOLN
2.0000 mL | Freq: Three times a day (TID) | RESPIRATORY_TRACT | Status: DC
  Filled 2017-08-05 (×2): qty 4

## 2017-08-05 NOTE — Progress Notes (Signed)
PROGRESS NOTE    Brenda Dunn  WUJ:[id number] DOB: Apr 11, 1951 DOA: 08/03/2017 PCP: Avanell Shackleton, [name]  Brief Narrative:67 y.o.femalewith medical history significant ofcopd,recent PEadmitted with increasing shortness of breath. She has a past medical history significant for hypertension hyperlipidemia history of PE on Eliquis COPD, hypothyroidism, admitted with shortness of breath wheezing and cough for 1 week. She saw her primary doctor as an outpatient who prescribed her doxycycline and Deltasone. Patient reports that she took doxycycline but she did not take Deltasone as she does not like the way it makes her feel. She was hospitalized last month for acute pulmonary embolism and discharged on Eliquis she reports that she has been taking Eliquis faithfully. Before that she was hospitalized in October with possible pneumonia and she reports that she has been taking a lot of antibiotics in the past few months. Her oxygen saturation was 84% with PCP office. She was placed on 2 L of oxygen and his saturation went up. She denies any fever chills chest pain nausea vomiting or diarrhea. She continues to smoke apparently she is cut down her smoking to 1 pack/week. ED Course:She received IV Solu-Medrol Levaquin and continued nebulizer treatment and magnesium sulfate. Her saturation drops with exertion to the 80s.   08/04/2017-She feels better than yesterday.still continues to cough.seen dr wert as an outpatient.has appointment to see drmannam in feb.still continues to smoke,    Assessment & Plan:   Active Problems:   COPD exacerbation (HCC)  Acute COPD exacerbation in a patient with ongoing smoking. Patient has had multiple hospital admissions for the same however she continues to smoke. I will treated with IV Solu-Medrol and IV Levaquin and taper the dose as possible continue as needed oxygen and SVN every 4. Start Protonix 40 mg daily. Chest x-raytoday shows actually looks better  than 2 weeks ago. With no evidence of inflammatory infiltrates.  Start chest PT, Mucomyst 4 doses, continue SVN treatments.  I will also add codeine cough syrup.  Recent acute pulmonary embolism dec /2018- restart L Eliquis 5 mg twice a day.  History of anxiety with benzodiazepine dependence I will restart her home dose of Valium.  Constipation continue Linzess  Insomnia I will continue her Remeron.  History of hypertension transient hypotension decrease the dose of ACE inhibitor.      DVT prophylaxiseliquis Code Status: full Family Communication:no family available Disposition Plan:tbd Consultants: none  Procedures:none Antimicrobials:levoquin   Subjective: Still has a lot of cough and secretions which she cannot bring it up.  Complains of shortness of breath.  Ongoing cough.  Objective: Vitals:   08/04/17 2058 08/05/17 0652 08/05/17 1108 08/05/17 1348  BP: 125/67 137/78  109/65  Pulse: 91 93  93  Resp: 20 20  18   Temp: 98 F (36.7 C) 98 F (36.7 C)  97.7 F (36.5 C)  TempSrc: Oral Oral  Oral  SpO2: 93% 96% 93% 96%  Weight:      Height:        Intake/Output Summary (Last 24 hours) at 08/05/2017 1455 Last data filed at 08/05/2017 1349 Gross per 24 hour  Intake 960 ml  Output -  Net 960 ml   Filed Weights   08/03/17 1230 08/03/17 2007  Weight: 62.1 kg (137 lb) 62.8 kg (138 lb 7.2 oz)    Examination:  General exam: Appears calm and comfortable  Respiratory system: wheezing to auscultation. Respiratory effort normal. Cardiovascular system: S1 & S2 heard, RRR. No JVD, murmurs, rubs, gallops or clicks. No pedal  edema. Gastrointestinal system: Abdomen is nondistended, soft and nontender. No organomegaly or masses felt. Normal bowel sounds heard. Central nervous system: Alert and oriented. No focal neurological deficits. Extremities: Symmetric 5 x 5 power. Skin: No rashes, lesions or ulcers Psychiatry: Judgement and insight appear normal. Mood & affect  appropriate.     Data Reviewed: I have personally reviewed following labs and imaging studies  CBC: Recent Labs  Lab 08/03/17 1428  WBC 9.6  NEUTROABS 4.8  HGB 12.3  HCT 37.6  MCV 95.4  PLT 206   Basic Metabolic Panel: Recent Labs  Lab 08/03/17 1428  NA 133*  K 3.9  CL 98*  CO2 26  GLUCOSE 89  BUN 13  CREATININE 0.83  CALCIUM 9.1   GFR: Estimated Creatinine Clearance: 55.3 mL/min (by C-G formula based on SCr of 0.83 mg/dL). Liver Function Tests: No results for input(s): AST, ALT, ALKPHOS, BILITOT, PROT, ALBUMIN in the last 168 hours. No results for input(s): LIPASE, AMYLASE in the last 168 hours. No results for input(s): AMMONIA in the last 168 hours. Coagulation Profile: No results for input(s): INR, PROTIME in the last 168 hours. Cardiac Enzymes: No results for input(s): CKTOTAL, CKMB, CKMBINDEX, TROPONINI in the last 168 hours. BNP (last 3 results) No results for input(s): PROBNP in the last 8760 hours. HbA1C: No results for input(s): HGBA1C in the last 72 hours. CBG: No results for input(s): GLUCAP in the last 168 hours. Lipid Profile: No results for input(s): CHOL, HDL, LDLCALC, TRIG, CHOLHDL, LDLDIRECT in the last 72 hours. Thyroid Function Tests: No results for input(s): TSH, T4TOTAL, FREET4, T3FREE, THYROIDAB in the last 72 hours. Anemia Panel: No results for input(s): VITAMINB12, FOLATE, FERRITIN, TIBC, IRON, RETICCTPCT in the last 72 hours. Sepsis Labs: No results for input(s): PROCALCITON, LATICACIDVEN in the last 168 hours.  No results found for this or any previous visit (from the past 240 hour(s)).       Radiology Studies: No results found.      Scheduled Meds: . acetylcysteine  2 mL Nebulization TID  . apixaban  5 mg Oral BID  . atorvastatin  40 mg Oral q1800  . benzonatate  100 mg Oral TID  . dextromethorphan-guaiFENesin  1 tablet Oral BID  . gabapentin  300 mg Oral BID   And  . gabapentin  600 mg Oral QHS  .  ipratropium-albuterol  3 mL Nebulization TID  . levofloxacin  500 mg Oral Daily  . levothyroxine  88 mcg Oral QAC breakfast  . linaclotide  290 mcg Oral QAC breakfast  . lisinopril  2.5 mg Oral Daily  . methylPREDNISolone (SOLU-MEDROL) injection  60 mg Intravenous Q6H  . mirabegron ER  50 mg Oral Daily  . mirtazapine  45 mg Oral QHS  . OXcarbazepine  450 mg Oral QHS  . pantoprazole  40 mg Oral Daily   Continuous Infusions:   LOS: 2 days       Alwyn RenElizabeth G Mylie Mccurley, MD [facility]  If 7PM-7AM, please contact night-coverage [email or web address] Password [email or web address] 08/05/2017, 2:55 PM

## 2017-08-06 ENCOUNTER — Inpatient Hospital Stay (HOSPITAL_COMMUNITY): Payer: PPO

## 2017-08-06 MED ORDER — HYDROCOD POLST-CPM POLST ER 10-8 MG/5ML PO SUER
5.0000 mL | Freq: Four times a day (QID) | ORAL | Status: DC | PRN
  Administered 2017-08-06 – 2017-08-07 (×5): 5 mL via ORAL
  Filled 2017-08-06 (×4): qty 5

## 2017-08-06 MED ORDER — HYDROCOD POLST-CPM POLST ER 10-8 MG/5ML PO SUER
5.0000 mL | Freq: Two times a day (BID) | ORAL | Status: DC | PRN
  Filled 2017-08-06: qty 5

## 2017-08-06 MED ORDER — METHYLPREDNISOLONE SODIUM SUCC 125 MG IJ SOLR
60.0000 mg | Freq: Two times a day (BID) | INTRAMUSCULAR | Status: DC
  Administered 2017-08-06 – 2017-08-07 (×2): 60 mg via INTRAVENOUS
  Filled 2017-08-06 (×2): qty 2

## 2017-08-06 NOTE — Progress Notes (Signed)
PROGRESS NOTE    Brenda Dunn  HYQ:[id number] DOB: 10/23/1950 DOA: 08/03/2017 PCP: Avanell ShackletonHenson, [name], [name]  Brief Narrative66 y.o.femalewith medical history significant ofcopd,recent PEadmitted with increasing shortness of breath. She has a past medical history significant for hypertension hyperlipidemia history of PE on Eliquis COPD, hypothyroidism, admitted with shortness of breath wheezing and cough for 1 week. She saw her primary doctor as an outpatient who prescribed her doxycycline and Deltasone. Patient reports that she took doxycycline but she did not take Deltasone as she does not like the way it makes her feel. She was hospitalized last month for acute pulmonary embolism and discharged on Eliquis she reports that she has been taking Eliquis faithfully. Before that she was hospitalized in October with possible pneumonia and she reports that she has been taking a lot of antibiotics in the past few months. Her oxygen saturation was 84% with PCP office. She was placed on 2 L of oxygen and his saturation went up. She denies any fever chills chest pain nausea vomiting or diarrhea. She continues to smoke apparently she is cut down her smoking to 1 pack/week. ED Course:She received IV Solu-Medrol Levaquin and continued nebulizer treatment and magnesium sulfate. Her saturation drops with exertion to the 80s.  08/06/2017 still continues to complain of cough and shortness of breath.   Assessment & Plan:   Active Problems:   COPD exacerbation (HCC)   Acute COPD exacerbation Taper steroids as tolerated.  Continue IV Levaquin.  Patient has been ordered SVN treatments every 4 hours.  Ordered Mucomyst yesterday for 4 doses.  Follow-up with pulmonary as an outpatient.  Tobacco abuse encourage patient to quit smoking counseled patient to quit smoking.  Acute pulmonary embolism December 2018 continue Eliquis 5 mg twice a day.  Hypertension blood pressure has been stable this morning it is  151/62 we will monitor him closely follow-up.  Her ACE inhibitor dose was decreased 2 days ago due to hypotension.  DVT prophylaxis: Eliquis Code Status: Full code Family Communication: None Disposition Plan: TBD Consultants:  None Procedures: None Antimicrobials: Levofloxacin  Subjective: Continues to have cough and shortness of breath.  Objective: Vitals:   08/05/17 1348 08/05/17 2000 08/06/17 0548 08/06/17 0945  BP: 109/65 111/64 (!) 151/62   Pulse: 93 97 96   Resp: 18 18 18    Temp: 97.7 F (36.5 C) 97.9 F (36.6 C) 97.9 F (36.6 C)   TempSrc: Oral Oral Oral   SpO2: 96% 94% 95% 95%  Weight:      Height:        Intake/Output Summary (Last 24 hours) at 08/06/2017 1036 Last data filed at 08/06/2017 0548 Gross per 24 hour  Intake 1075 ml  Output -  Net 1075 ml   Filed Weights   08/03/17 1230 08/03/17 2007  Weight: 62.1 kg (137 lb) 62.8 kg (138 lb 7.2 oz)    Examination:  General exam: Appears calm and comfortable  Respiratory system: wheezing/rhonchi auscultation. Respiratory effort normal. Cardiovascular system: S1 & S2 heard, RRR. No JVD, murmurs, rubs, gallops or clicks. No pedal edema. Gastrointestinal system: Abdomen is nondistended, soft and nontender. No organomegaly or masses felt. Normal bowel sounds heard. Central nervous system: Alert and oriented. No focal neurological deficits. Extremities: Symmetric 5 x 5 power. Skin: No rashes, lesions or ulcers Psychiatry: Judgement and insight appear normal. Mood & affect appropriate.     Data Reviewed: I have personally reviewed following labs and imaging studies  CBC: Recent Labs  Lab 08/03/17 1428  WBC 9.6  NEUTROABS 4.8  HGB 12.3  HCT 37.6  MCV 95.4  PLT 206   Basic Metabolic Panel: Recent Labs  Lab 08/03/17 1428  NA 133*  K 3.9  CL 98*  CO2 26  GLUCOSE 89  BUN 13  CREATININE 0.83  CALCIUM 9.1   GFR: Estimated Creatinine Clearance: 55.3 mL/min (by C-G formula based on SCr of 0.83  mg/dL). Liver Function Tests: No results for input(s): AST, ALT, ALKPHOS, BILITOT, PROT, ALBUMIN in the last 168 hours. No results for input(s): LIPASE, AMYLASE in the last 168 hours. No results for input(s): AMMONIA in the last 168 hours. Coagulation Profile: No results for input(s): INR, PROTIME in the last 168 hours. Cardiac Enzymes: No results for input(s): CKTOTAL, CKMB, CKMBINDEX, TROPONINI in the last 168 hours. BNP (last 3 results) No results for input(s): PROBNP in the last 8760 hours. HbA1C: No results for input(s): HGBA1C in the last 72 hours. CBG: No results for input(s): GLUCAP in the last 168 hours. Lipid Profile: No results for input(s): CHOL, HDL, LDLCALC, TRIG, CHOLHDL, LDLDIRECT in the last 72 hours. Thyroid Function Tests: No results for input(s): TSH, T4TOTAL, FREET4, T3FREE, THYROIDAB in the last 72 hours. Anemia Panel: No results for input(s): VITAMINB12, FOLATE, FERRITIN, TIBC, IRON, RETICCTPCT in the last 72 hours. Sepsis Labs: No results for input(s): PROCALCITON, LATICACIDVEN in the last 168 hours.  No results found for this or any previous visit (from the past 240 hour(s)).       Radiology Studies: Dg Chest 1 View  Result Date: 08/06/2017 CLINICAL DATA:  Cough EXAM: CHEST 1 VIEW COMPARISON:  08/03/2017 FINDINGS: Mild left basilar opacity, atelectasis versus pneumonia. Right lung is clear. No pleural effusion or pneumothorax. The heart is normal in size. IMPRESSION: Mild left basilar opacity, atelectasis versus pneumonia. Electronically Signed   By: Charline Bills M.D.   On: 08/06/2017 07:24        Scheduled Meds: . acetylcysteine  2 mL Nebulization TID  . apixaban  5 mg Oral BID  . atorvastatin  40 mg Oral q1800  . benzonatate  100 mg Oral TID  . dextromethorphan-guaiFENesin  1 tablet Oral BID  . gabapentin  300 mg Oral BID   And  . gabapentin  600 mg Oral QHS  . ipratropium-albuterol  3 mL Nebulization TID  . levofloxacin  500 mg Oral  Daily  . levothyroxine  88 mcg Oral QAC breakfast  . linaclotide  290 mcg Oral QAC breakfast  . lisinopril  2.5 mg Oral Daily  . methylPREDNISolone (SOLU-MEDROL) injection  60 mg Intravenous Q12H  . mirabegron ER  50 mg Oral Daily  . mirtazapine  45 mg Oral QHS  . OXcarbazepine  450 mg Oral QHS  . pantoprazole  40 mg Oral Daily   Continuous Infusions:   LOS: 3 days    Alwyn Ren, MD [facility]  If 7PM-7AM, please contact night-coverage [email or web address] Password [email or web address] 08/06/2017, 10:36 AM

## 2017-08-07 LAB — BASIC METABOLIC PANEL
ANION GAP: 9 (ref 5–15)
BUN: 18 mg/dL (ref 6–20)
CALCIUM: 9.2 mg/dL (ref 8.9–10.3)
CO2: 23 mmol/L (ref 22–32)
Chloride: 102 mmol/L (ref 101–111)
Creatinine, Ser: 0.88 mg/dL (ref 0.44–1.00)
GFR calc Af Amer: >60 mL/min (ref >60)
GLUCOSE: 140 mg/dL — AB (ref 65–99)
POTASSIUM: 4.3 mmol/L (ref 3.5–5.1)
Sodium: 134 mmol/L — ABNORMAL LOW (ref 135–145)

## 2017-08-07 LAB — CBC
HCT: 35.4 % — ABNORMAL LOW (ref 36.0–46.0)
Hemoglobin: 11.4 g/dL — ABNORMAL LOW (ref 12.0–15.0)
MCH: 31 pg (ref 26.0–34.0)
MCHC: 32.2 g/dL (ref 30.0–36.0)
MCV: 96.2 fL (ref 78.0–100.0)
PLATELETS: 223 10*3/uL (ref 150–400)
RBC: 3.68 MIL/uL — AB (ref 3.87–5.11)
RDW: 13.6 % (ref 11.5–15.5)
WBC: 11.1 10*3/uL — AB (ref 4.0–10.5)

## 2017-08-07 MED ORDER — BUDESONIDE-FORMOTEROL FUMARATE 80-4.5 MCG/ACT IN AERO: 2.0000 | INHALATION_SPRAY | Freq: Two times a day (BID) | RESPIRATORY_TRACT | 12 refills | Status: DC

## 2017-08-07 MED ORDER — ALBUTEROL SULFATE HFA 108 (90 BASE) MCG/ACT IN AERS: 2.0000 | INHALATION_SPRAY | RESPIRATORY_TRACT | 1 refills | Status: DC | PRN

## 2017-08-07 MED ORDER — LEVOFLOXACIN 500 MG PO TABS: 500.0000 mg | ORAL_TABLET | Freq: Every day | ORAL | 0 refills | Status: DC

## 2017-08-07 MED ORDER — HYDROCOD POLST-CPM POLST ER 10-8 MG/5ML PO SUER: 5.0000 mL | Freq: Two times a day (BID) | ORAL | 0 refills | Status: DC | PRN

## 2017-08-07 MED ORDER — PREDNISONE 20 MG PO TABS: 20.0000 mg | ORAL_TABLET | Freq: Every day | ORAL | 0 refills | Status: DC

## 2017-08-07 MED ORDER — PREDNISONE 10 MG PO TABS: ORAL_TABLET | ORAL | 0 refills | Status: DC

## 2017-08-07 NOTE — Progress Notes (Signed)
Discharge instructions reviewed with patient and patient verbalized understanding. Patient discharged with husband via private vehicle.

## 2017-08-07 NOTE — Discharge Summary (Signed)
Physician Discharge Summary  Brenda PonderSonja E Dunn ZOX:[id number] DOB: 02/09/1951 DOA: 08/03/2017  PCP: Avanell ShackletonHenson, [name], [name]  Admit date: 08/03/2017 Discharge date: 08/07/2017  Admitted From:HOME Disposition: HOME  Recommendations for Outpatient Follow-up:  1. Follow up with PCP in 1-2 weeks 2. Please obtain BMP/CBC in one week  Home Health:NONE Equipment/Devices:NONE Discharge ConditionSTABLE CODE STATUS:FULL Diet recommendation CARDIAC  Brief/Interim Summary:67 y.o.femalewith medical history significant ofcopd,recent PEadmitted with increasing shortness of breath. She has a past medical history significant for hypertension hyperlipidemia history of PE on Eliquis COPD, hypothyroidism, admitted with shortness of breath wheezing and cough for 1 week. She saw her primary doctor as an outpatient who prescribed her doxycycline and Deltasone. Patient reports that she took doxycycline but she did not take Deltasone as she does not like the way it makes her feel. She was hospitalized last month for acute pulmonary embolism and discharged on Eliquis she reports that she has been taking Eliquis faithfully. Before that she was hospitalized in October with possible pneumonia and she reports that she has been taking a lot of antibiotics in the past few months. Her oxygen saturation was 84% with PCP office. She was placed on 2 L of oxygen and his saturation went up. She denies any fever chills chest pain nausea vomiting or diarrhea. She continues to smoke apparently she is cut down her smoking to 1 pack/week. ED Course:She received IV Solu-Medrol Levaquin and continued nebulizer treatment and magnesium sulfate. Her saturation drops with exertion to the 80s.   Discharge Diagnoses:  Active Problems:   COPD exacerbation (HCC) Status post acute COPD exacerbation patient was treated with IV steroids IV Levaquin SVN treatments around-the-clock.  He was also treated with cough syrup with codeine and Mucomyst.   Patient symptoms seem to improve with above treatments.  And she is being discharged today on p.o. Levaquin p.o. steroids and follow-up with PC CM.  Patient has already supposed to call office of Dr. Isaiah SergeMannam and make an appointment.  Discussed in detail and stressed about the importance of quitting smoking.  Discussed with patient's husband.   Discharge Instructions   Allergies as of 08/07/2017   No Known Allergies     Medication List    STOP taking these medications   doxycycline 100 MG tablet Commonly known as:  VIBRA-TABS   predniSONE 10 MG tablet Commonly known as:  DELTASONE   Tiotropium Bromide-Olodaterol 2.5-2.5 MCG/ACT Aers Commonly known as:  STIOLTO RESPIMAT     TAKE these medications   albuterol 108 (90 Base) MCG/ACT inhaler Commonly known as:  PROVENTIL HFA;VENTOLIN HFA Inhale 2 puffs into the lungs every 4 (four) hours as needed for wheezing or shortness of breath.   apixaban 5 MG Tabs tablet Commonly known as:  ELIQUIS Take 10 mg twice a day till 07/21/2017, start taking 5 mg twice a day from 07/22/2017 What changed:    how much to take  how to take this  when to take this  additional instructions   atorvastatin 40 MG tablet Commonly known as:  LIPITOR Take 40 mg by mouth daily.   benzonatate 100 MG capsule Commonly known as:  TESSALON Take 1 capsule (100 mg total) by mouth 3 (three) times daily.   budesonide-formoterol 80-4.5 MCG/ACT inhaler Commonly known as:  SYMBICORT Inhale 2 puffs into the lungs 2 (two) times daily.   chlorpheniramine-HYDROcodone 10-8 MG/5ML Suer Commonly known as:  TUSSIONEX Take 5 mLs by mouth every 12 (twelve) hours as needed for cough.   dextromethorphan-guaiFENesin 30-600 MG  12hr tablet Commonly known as:  MUCINEX DM Take 1 tablet by mouth 2 (two) times daily.   diazepam 5 MG tablet Commonly known as:  VALIUM Take 5 mg by mouth. Take 1 tablet in the am, 1/2 tablet at lunch and 1 tablet at night   gabapentin 300  MG capsule Commonly known as:  NEURONTIN Take 300 mg by mouth 4 (four) times daily. 300 MG IN THE MORNING, 300 MG AT LUNCH AND 600 MG AT BEDTIME   ipratropium-albuterol 0.5-2.5 (3) MG/3ML Soln Commonly known as:  DUONEB Take 3 mLs by nebulization 3 (three) times daily.   levofloxacin 500 MG tablet Commonly known as:  LEVAQUIN Take 1 tablet (500 mg total) by mouth daily.   levothyroxine 88 MCG tablet Commonly known as:  SYNTHROID, LEVOTHROID TAKE 1 TABLET EVERY DAY ON EMPTY STOMACH   LINZESS 290 MCG Caps capsule Generic drug:  linaclotide TAKE 1 CAPSULE BY MOUTH ONCE A DAY   lisinopril 40 MG tablet Commonly known as:  PRINIVIL,ZESTRIL Take 40 mg by mouth daily.   mirtazapine 45 MG tablet Commonly known as:  REMERON Take 45 mg by mouth at bedtime.   MYRBETRIQ 50 MG Tb24 tablet Generic drug:  mirabegron ER TAKE 1 TABLET BY MOUTH EVERY DAY   omeprazole 40 MG capsule Commonly known as:  PRILOSEC Take 40 mg by mouth daily.   OXcarbazepine 150 MG tablet Commonly known as:  TRILEPTAL Take 450 mg by mouth at bedtime.   STOOL SOFTENER PO Take 3 tablets by mouth daily.       No Known Allergies  Consultations:   Procedures/Studies: Dg Chest 1 View  Result Date: 08/06/2017 CLINICAL DATA:  Cough EXAM: CHEST 1 VIEW COMPARISON:  08/03/2017 FINDINGS: Mild left basilar opacity, atelectasis versus pneumonia. Right lung is clear. No pleural effusion or pneumothorax. The heart is normal in size. IMPRESSION: Mild left basilar opacity, atelectasis versus pneumonia. Electronically Signed   By: Charline Bills M.D.   On: 08/06/2017 07:24   Dg Chest 2 View  Result Date: 08/03/2017 CLINICAL DATA:  Productive cough. EXAM: CHEST  2 VIEW COMPARISON:  CT 07/15/2017.  Chest x-ray 07/14/2017 . FINDINGS: Mediastinum and hilar structures normal. Interval near complete clearing of bibasilar atelectasis. No focal alveolar infiltrate. No pleural effusion or pneumothorax. Degenerative changes  thoracic spine. IMPRESSION: No focal alveolar infiltrate. Interim near complete clearing of previously identified bibasilar atelectasis . Electronically Signed   By: Maisie Fus  Register   On: 08/03/2017 14:11   Dg Chest 2 View  Result Date: 07/14/2017 CLINICAL DATA:  Shortness of breath.  Chest tightness. EXAM: CHEST  2 VIEW COMPARISON:  CT 05/29/2017.  Chest x-ray 05/28/2017. FINDINGS: Mediastinum and hilar structures normal. Cardiomegaly with normal pulmonary vascularity. Mild bilateral increased interstitial markings. Mild pneumonitis cannot be excluded No focal infiltrate. No pleural effusion or pneumothorax. Degenerative changes thoracic spine. IMPRESSION: Mild bilateral increased interstitial markings. Mild pneumonitis cannot be excluded. Electronically Signed   By: Maisie Fus  Register   On: 07/14/2017 12:59   Ct Angio Chest Pe W Or Wo Contrast  Result Date: 07/15/2017 CLINICAL DATA:  Shortness of breath with cough for several days EXAM: CT ANGIOGRAPHY CHEST WITH CONTRAST TECHNIQUE: Multidetector CT imaging of the chest was performed using the standard protocol during bolus administration of intravenous contrast. Multiplanar CT image reconstructions and MIPs were obtained to evaluate the vascular anatomy. CONTRAST:  ISOVUE-370 IOPAMIDOL (ISOVUE-370) INJECTION 76% COMPARISON:  Plain film from 07/14/2017, CT from 05/29/2017. FINDINGS: Cardiovascular: Thoracic aorta shows mild  atherosclerotic change without aneurysmal dilatation. No cardiac enlargement is seen. No significant coronary calcifications are noted. The pulmonary artery demonstrates a normal branching pattern. Decreased opacification of the left lower lobe posteromedial branches is noted with relatively normal appearing opacification in the remainder of the lungs. Some motion artifact is noted and a vague filling defect is identified consistent with pulmonary embolus. No other filling defects are identified. Mediastinum/Nodes: No significant  hilar or mediastinal adenopathy is noted. The thoracic inlet is within normal limits. The esophagus is within normal limits with the exception of a small sliding-type hiatal hernia. Lungs/Pleura: The lungs are well aerated bilaterally with mild bibasilar atelectasis. Mild emphysematous changes are noted. No focal infiltrate or sizable effusion is seen. No sizable parenchymal nodules are noted. Upper Abdomen: No acute abnormality. Musculoskeletal: Degenerative changes of the thoracic spine are noted. No acute bony abnormality is seen. Review of the MIP images confirms the above findings. IMPRESSION: Left lower lobe pulmonary arterial embolus with decreased opacification of the distal branches posterior medially. No focal infiltrate is seen.  Mild bibasilar atelectasis is noted. Aortic Atherosclerosis (ICD10-I70.0) and Emphysema (ICD10-J43.9). Electronically Signed   By: Alcide Clever M.D.   On: 07/15/2017 07:35    (Echo, Carotid, EGD, Colonoscopy, ERCP)    Subjective:   Discharge Exam: Vitals:   08/06/17 2126 08/07/17 0531  BP:  115/64  Pulse:  94  Resp:  18  Temp:  98.3 F (36.8 C)  SpO2: 98% 97%   Vitals:   08/06/17 1622 08/06/17 2053 08/06/17 2126 08/07/17 0531  BP:  122/63  115/64  Pulse:  89  94  Resp:  20  18  Temp:  98.5 F (36.9 C)  98.3 F (36.8 C)  TempSrc:  Oral  Oral  SpO2: 95% 96% 98% 97%  Weight:      Height:        General: Pt is alert, awake, not in acute distress Cardiovascular: RRR, S1/S2 +, no rubs, no gallops Respiratory: CTA bilaterally, no wheezing, no rhonchi Abdominal: Soft, NT, ND, bowel sounds + Extremities: no edema, no cyanosis    The results of significant diagnostics from this hospitalization (including imaging, microbiology, ancillary and laboratory) are listed below for reference.     Microbiology: No results found for this or any previous visit (from the past 240 hour(s)).   Labs: BNP (last 3 results) Recent Labs    05/13/17 1029  BNP  13.0   Basic Metabolic Panel: Recent Labs  Lab 08/03/17 1428  NA 133*  K 3.9  CL 98*  CO2 26  GLUCOSE 89  BUN 13  CREATININE 0.83  CALCIUM 9.1   Liver Function Tests: No results for input(s): AST, ALT, ALKPHOS, BILITOT, PROT, ALBUMIN in the last 168 hours. No results for input(s): LIPASE, AMYLASE in the last 168 hours. No results for input(s): AMMONIA in the last 168 hours. CBC: Recent Labs  Lab 08/03/17 1428 08/07/17 1009  WBC 9.6 11.1*  NEUTROABS 4.8  --   HGB 12.3 11.4*  HCT 37.6 35.4*  MCV 95.4 96.2  PLT 206 223   Cardiac Enzymes: No results for input(s): CKTOTAL, CKMB, CKMBINDEX, TROPONINI in the last 168 hours. BNP: Invalid input(s): POCBNP CBG: No results for input(s): GLUCAP in the last 168 hours. D-Dimer No results for input(s): DDIMER in the last 72 hours. Hgb A1c No results for input(s): HGBA1C in the last 72 hours. Lipid Profile No results for input(s): CHOL, HDL, LDLCALC, TRIG, CHOLHDL, LDLDIRECT in the last 72 hours.  Thyroid function studies No results for input(s): TSH, T4TOTAL, T3FREE, THYROIDAB in the last 72 hours.  Invalid input(s): FREET3 Anemia work up No results for input(s): VITAMINB12, FOLATE, FERRITIN, TIBC, IRON, RETICCTPCT in the last 72 hours. Urinalysis    Component Value Date/Time   COLORURINE STRAW (A) 07/14/2017 2110   APPEARANCEUR CLEAR 07/14/2017 2110   LABSPEC 1.005 07/14/2017 2110   LABSPEC 1.020 05/18/2017 1402   PHURINE 5.0 07/14/2017 2110   GLUCOSEU NEGATIVE 07/14/2017 2110   HGBUR NEGATIVE 07/14/2017 2110   BILIRUBINUR NEGATIVE 07/14/2017 2110   BILIRUBINUR negative 05/18/2017 1402   KETONESUR NEGATIVE 07/14/2017 2110   PROTEINUR NEGATIVE 07/14/2017 2110   NITRITE NEGATIVE 07/14/2017 2110   LEUKOCYTESUR NEGATIVE 07/14/2017 2110   Sepsis Labs Invalid input(s): PROCALCITONIN,  WBC,  LACTICIDVEN Microbiology No results found for this or any previous visit (from the past 240 hour(s)).   Time coordinating  discharge: Over 30 minutes  SIGNED:   Alwyn Ren, MD  [facility] 08/07/2017, 10:37 AM Pager   If 7PM-7AM, please contact night-coverage [email or web address] Password [email or web address]

## 2017-08-08 ENCOUNTER — Telehealth: Payer: Self-pay | Admitting: Family Medicine

## 2017-08-08 NOTE — Telephone Encounter (Signed)
Pt called concerning recent hospital stay. Pt states she is some better but now losing her voice. While discussing medications she stated she was very confused about her meds when she left the hospital. She stated that the AVS was very hard to understand. She states she called the pharmacy to get clarification and now has a good understanding of meds. Current and discharge meds were reconciled. Pt is coming in tomorrow and was asked to bring all her medications with her. Pt verified understanding.

## 2017-08-09 ENCOUNTER — Encounter: Payer: Self-pay | Admitting: Family Medicine

## 2017-08-09 ENCOUNTER — Ambulatory Visit (INDEPENDENT_AMBULATORY_CARE_PROVIDER_SITE_OTHER): Payer: PPO | Admitting: Family Medicine

## 2017-08-09 ENCOUNTER — Ambulatory Visit: Payer: Medicare Other | Admitting: Family Medicine

## 2017-08-09 VITALS — BP 134/84 | HR 82 | Resp 16 | Ht 59.5 in | Wt 134.2 lb

## 2017-08-09 DIAGNOSIS — E890 Postprocedural hypothyroidism: Secondary | ICD-10-CM

## 2017-08-09 DIAGNOSIS — F17200 Nicotine dependence, unspecified, uncomplicated: Secondary | ICD-10-CM | POA: Diagnosis not present

## 2017-08-09 DIAGNOSIS — I10 Essential (primary) hypertension: Secondary | ICD-10-CM

## 2017-08-09 DIAGNOSIS — I2699 Other pulmonary embolism without acute cor pulmonale: Secondary | ICD-10-CM | POA: Diagnosis not present

## 2017-08-09 DIAGNOSIS — Z09 Encounter for follow-up examination after completed treatment for conditions other than malignant neoplasm: Secondary | ICD-10-CM | POA: Diagnosis not present

## 2017-08-09 NOTE — Progress Notes (Signed)
Subjective:    Patient ID: Elisha PonderSonja E Whisman, female    DOB: 06/21/1951, 67 y.o.   MRN: [id number]  HPI Chief Complaint  Patient presents with  . Follow-up    hospital, breathing has improved   She is here for a hospital follow up. Hospitalized from 08/03/2016 to 08/07/2016 for COPD exacerbation. States she is feeling well. She brought in all of her medications and is confused about which medications she should be taking. She has an incentive spirometer with her and would like to make sure she is using it correctly.  No other concerns.  Has a follow up appointment with Dr. Isaiah SergeMannam, pulmonologist in February. States she scheduled the appointment in late February due to financial reasons.   Denies fever, chills, dizziness, chest pain, palpitations, shortness of breath, cough, wheezing, abdominal pain, N/V/D.   She is still smoking but has cut back. Does not plan on stopping.   Past Medical History:  Diagnosis Date  . Anxiety   . Benzodiazepine dependence (HCC)   . Colonic polyp    mulitple polyps. repeat in 05/2019  . Constipation    IBS  . COPD (chronic obstructive pulmonary disease) (HCC)   . Depression   . GERD (gastroesophageal reflux disease)   . Hyperlipidemia   . Hypertension   . Insomnia   . Migraine headache   . OAB (overactive bladder)   . Osteoarthritis   . Post-surgical hypothyroidism   . Scoliosis    Past Surgical History:  Procedure Laterality Date  . HEMORRHOID SURGERY    [facility]  . TUBAL LIGATION  1979   Social History   Socioeconomic History  . Marital status: Married    Spouse name: Not on file  . Number of children: Not on file  . Years of education: Not on file  . Highest education level: Not on file  Social Needs  . Financial resource strain: Not on file  . Food insecurity - worry: Not on file  . Food insecurity - inability: Not on file  . Transportation needs - medical: Not on file  . Transportation needs - non-medical: Not  on file  Occupational History  . Occupation: disabled due "my nerves"  Tobacco Use  . Smoking status: Current Every Day Smoker    Packs/day: 1.50    Years: 44.00    Pack years: 66.00    Types: Cigarettes  . Smokeless tobacco: Never Used  Substance and Sexual Activity  . Alcohol use: No  . Drug use: No  . Sexual activity: Not on file  Other Topics Concern  . Not on file  Social History Narrative  . Not on file       Review of Systems Pertinent positives and negatives in the history of present illness.     Objective:   Physical Exam  Constitutional: She is oriented to person, place, and time. She appears well-developed and well-nourished. No distress.  HENT:  Mouth/Throat: Oropharynx is clear and moist.  Eyes: Conjunctivae are normal. Pupils are equal, round, and reactive to light.  Neck: Normal range of motion. Neck supple.  Cardiovascular: Normal rate and regular rhythm.  Pulmonary/Chest: Effort normal and breath sounds normal.  Neurological: She is alert and oriented to person, place, and time.  Skin: Skin is warm and dry.  Psychiatric: She has a normal mood and affect. Her behavior is normal. Thought content normal.   BP 134/84 (BP Location: Right Arm, Patient Position: Sitting)  Pulse 82   Resp 16   Ht 4' 11.5" (1.511 m)   Wt 134 lb 3.2 oz (60.9 kg)   SpO2 94%   BMI 26.65 kg/m       Assessment & Plan:  Hospital discharge follow-up  Essential hypertension  Other acute pulmonary embolism without acute cor pulmonale (HCC)  Post-surgical hypothyroidism  Smoker  Reviewed hospital discharge summary, medications, lab results, imaging and follow up recommendations. She was to follow up in 1-2 weeks post discharge with me however she is here 2 days post discharge. Labs not drawn today but will have her back in 4 weeks.  BP is in goal range.  She is taking Eliquis as prescribed.  Reviewed recent TSH with her, she questioned when it was last checked.    Smoking cessation counseling done.  Spent several minutes going through medications with her until she felt comfortable. Offered to put in referral for home health nurse to help her with medications and she declines.  Will follow up in 4 weeks and check labs.

## 2017-08-15 ENCOUNTER — Telehealth: Payer: Self-pay | Admitting: Medical

## 2017-08-15 ENCOUNTER — Other Ambulatory Visit: Payer: Self-pay | Admitting: Medical

## 2017-08-15 DIAGNOSIS — R059 Cough, unspecified: Secondary | ICD-10-CM

## 2017-08-15 DIAGNOSIS — R05 Cough: Secondary | ICD-10-CM

## 2017-08-15 MED ORDER — BENZONATATE 100 MG PO CAPS: 100.0000 mg | ORAL_CAPSULE | Freq: Three times a day (TID) | ORAL | 0 refills | Status: DC

## 2017-08-15 NOTE — Telephone Encounter (Signed)
Husband left message pt needs refill Benzonatate

## 2017-08-16 ENCOUNTER — Telehealth: Payer: Self-pay

## 2017-08-16 NOTE — Telephone Encounter (Signed)
[facility] and I already sent tessalon yesterday

## 2017-08-16 NOTE — Telephone Encounter (Signed)
Pt called requesting Eliquis 5mg  and bezonate 100 mg. Pt also has changed pharmacy to [facility] [address] [place]. Pt is almost out of Eliquis and will not have enough to last her through the weekend. Also still has really bad cough. Please advise thanks.

## 2017-08-17 ENCOUNTER — Other Ambulatory Visit: Payer: Self-pay

## 2017-08-17 MED ORDER — APIXABAN 5 MG PO TABS: ORAL_TABLET | ORAL | 0 refills | Status: DC

## 2017-08-21 ENCOUNTER — Ambulatory Visit: Payer: Medicare Other | Admitting: Podiatry

## 2017-08-28 ENCOUNTER — Ambulatory Visit (INDEPENDENT_AMBULATORY_CARE_PROVIDER_SITE_OTHER): Payer: PPO | Admitting: Family Medicine

## 2017-08-28 ENCOUNTER — Encounter: Payer: Self-pay | Admitting: Family Medicine

## 2017-08-28 VITALS — BP 116/70 | HR 71 | Wt 134.8 lb

## 2017-08-28 DIAGNOSIS — Z7901 Long term (current) use of anticoagulants: Secondary | ICD-10-CM | POA: Diagnosis not present

## 2017-08-28 DIAGNOSIS — Z86711 Personal history of pulmonary embolism: Secondary | ICD-10-CM

## 2017-08-28 DIAGNOSIS — D649 Anemia, unspecified: Secondary | ICD-10-CM

## 2017-08-28 DIAGNOSIS — E871 Hypo-osmolality and hyponatremia: Secondary | ICD-10-CM

## 2017-08-28 MED ORDER — APIXABAN 5 MG PO TABS: ORAL_TABLET | ORAL | 2 refills | Status: DC

## 2017-08-28 NOTE — Progress Notes (Signed)
   Subjective:    Patient ID: Brenda Dunn, female    DOB: 06/02/1951, 67 y.o.   MRN: [id number]  HPI Chief Complaint  Patient presents with  . follow-up    follow-up from hospital labs   She is here to follow up on abnormal labs and to have blood work done today.  States she feels the best she has had several months.  Diagnosed in December with an acute PE and has been taking Eliquis daily. Requests refill today. Doing well and no sign of bleeding.   Denies fever, chills, dizziness, chest pain, palpitations, shortness of breath, abdominal pain, N/V/D, urinary symptoms, LE edema.   Reviewed allergies, medications, past medical, surgical, family, and social history.    Review of Systems Pertinent positives and negatives in the history of present illness.     Objective:   Physical Exam BP 116/70   Pulse 71   Wt 134 lb 12.8 oz (61.1 kg)   SpO2 97%   BMI 26.77 kg/m   Alert and in no distress. Pharyngeal area is normal. Cardiac exam shows a regular sinus rhythm without murmurs or gallops. Lungs are clear to auscultation.      Assessment & Plan:  Anemia, mild - Plan: CBC with Differential/Platelet  Hyponatremia - Plan: Basic metabolic panel  Personal history of pulmonary embolism - Plan: apixaban (ELIQUIS) 5 MG TABS tablet  Chronic anticoagulation - Plan: apixaban (ELIQUIS) 5 MG TABS tablet  Plan to check labs and follow up. Vitals are wnl. No acute distress and today is the first time I have seen her this well appearing in several months.  Discussed keeping her on Eliquis for 6 months as opposed to 3 months since it is unclear as to why she developed a PE. She is not having any obvious side effects.  Follow up pending labs.

## 2017-08-28 NOTE — Patient Instructions (Signed)
I am thrilled that you are doing so well! We refilled the Eliquis.  Please follow up with your pulmonologist as scheduled in February.   We will call you with your lab results.

## 2017-08-29 LAB — BASIC METABOLIC PANEL
BUN / CREAT RATIO: 12 (ref 12–28)
BUN: 7 mg/dL — ABNORMAL LOW (ref 8–27)
CO2: 19 mmol/L — ABNORMAL LOW (ref 20–29)
CREATININE: 0.59 mg/dL (ref 0.57–1.00)
Calcium: 9.5 mg/dL (ref 8.7–10.3)
Chloride: 94 mmol/L — ABNORMAL LOW (ref 96–106)
GFR, EST AFRICAN AMERICAN: 111 mL/min/{1.73_m2} (ref >59)
GFR, EST NON AFRICAN AMERICAN: 96 mL/min/{1.73_m2} (ref >59)
Glucose: 92 mg/dL (ref 65–99)
POTASSIUM: 3.9 mmol/L (ref 3.5–5.2)
SODIUM: 131 mmol/L — AB (ref 134–144)

## 2017-08-29 LAB — CBC WITH DIFFERENTIAL/PLATELET
BASOS: 0 %
Basophils Absolute: 0 10*3/uL (ref 0.0–0.2)
EOS (ABSOLUTE): 0.1 10*3/uL (ref 0.0–0.4)
EOS: 2 %
HEMATOCRIT: 33.2 % — AB (ref 34.0–46.6)
Hemoglobin: 11.6 g/dL (ref 11.1–15.9)
IMMATURE GRANULOCYTES: 0 %
Immature Grans (Abs): 0 10*3/uL (ref 0.0–0.1)
LYMPHS ABS: 1.7 10*3/uL (ref 0.7–3.1)
Lymphs: 24 %
MCH: 30.8 pg (ref 26.6–33.0)
MCHC: 34.9 g/dL (ref 31.5–35.7)
MCV: 88 fL (ref 79–97)
MONOS ABS: 0.5 10*3/uL (ref 0.1–0.9)
Monocytes: 7 %
NEUTROS ABS: 5 10*3/uL (ref 1.4–7.0)
NEUTROS PCT: 67 %
Platelets: 222 10*3/uL (ref 150–379)
RBC: 3.77 x10E6/uL (ref 3.77–5.28)
RDW: 13.4 % (ref 12.3–15.4)
WBC: 7.3 10*3/uL (ref 3.4–10.8)

## 2017-08-30 DIAGNOSIS — J441 Chronic obstructive pulmonary disease with (acute) exacerbation: Secondary | ICD-10-CM | POA: Diagnosis not present

## 2017-09-06 ENCOUNTER — Ambulatory Visit: Payer: PPO | Admitting: Family Medicine

## 2017-09-06 ENCOUNTER — Other Ambulatory Visit: Payer: Self-pay | Admitting: Family Medicine

## 2017-09-11 ENCOUNTER — Telehealth: Payer: Self-pay | Admitting: Family Medicine

## 2017-09-11 DIAGNOSIS — F411 Generalized anxiety disorder: Secondary | ICD-10-CM | POA: Diagnosis not present

## 2017-09-11 NOTE — Telephone Encounter (Signed)
We should keep an eye on this. If she is feeling fine then she may just not be eating enough calories. Have her track her calories for the next week or two and see how she is doing. I would think she needs at least 1,800 calories per day to gain a pound. She can try adding a protein shake or protein bar daily and if she is still losing weight we will need her to come in. Thanks.

## 2017-09-11 NOTE — Telephone Encounter (Signed)
Pt called, her weight is down 8 pounds.  Do you want to see her and when?  Not feeling tired, no issues. No thryoid.

## 2017-09-12 NOTE — Telephone Encounter (Signed)
Called pt reached voice mail [email or web address] with info.

## 2017-09-13 ENCOUNTER — Other Ambulatory Visit: Payer: Self-pay | Admitting: Family Medicine

## 2017-09-13 MED ORDER — BUDESONIDE-FORMOTEROL FUMARATE 80-4.5 MCG/ACT IN AERO: 2.0000 | INHALATION_SPRAY | Freq: Two times a day (BID) | RESPIRATORY_TRACT | 5 refills | Status: DC

## 2017-09-25 ENCOUNTER — Encounter: Payer: Self-pay | Admitting: Pulmonary Disease

## 2017-09-25 ENCOUNTER — Ambulatory Visit (INDEPENDENT_AMBULATORY_CARE_PROVIDER_SITE_OTHER): Payer: PPO | Admitting: Pulmonary Disease

## 2017-09-25 ENCOUNTER — Ambulatory Visit: Payer: PPO | Admitting: Pulmonary Disease

## 2017-09-25 VITALS — BP 106/62 | HR 89 | Ht 59.5 in | Wt 128.0 lb

## 2017-09-25 DIAGNOSIS — R911 Solitary pulmonary nodule: Secondary | ICD-10-CM | POA: Diagnosis not present

## 2017-09-25 DIAGNOSIS — J441 Chronic obstructive pulmonary disease with (acute) exacerbation: Secondary | ICD-10-CM

## 2017-09-25 DIAGNOSIS — J439 Emphysema, unspecified: Secondary | ICD-10-CM

## 2017-09-25 LAB — PULMONARY FUNCTION TEST
DL/VA % PRED: 99 %
DL/VA: 3.96 ml/min/mmHg/L
DLCO UNC % PRED: 54 %
DLCO cor % pred: 58 %
DLCO cor: 9.85 ml/min/mmHg
DLCO unc: 9.25 ml/min/mmHg
FEF 25-75 PRE: 0.92 L/s
FEF 25-75 Post: 1.36 L/sec
FEF2575-%CHANGE-POST: 47 %
FEF2575-%Pred-Post: 76 %
FEF2575-%Pred-Pre: 51 %
FEV1-%CHANGE-POST: 8 %
FEV1-%PRED-PRE: 64 %
FEV1-%Pred-Post: 70 %
FEV1-PRE: 1.22 L
FEV1-Post: 1.33 L
FEV1FVC-%Change-Post: 3 %
FEV1FVC-%Pred-Pre: 99 %
FEV6-%CHANGE-POST: 4 %
FEV6-%PRED-POST: 70 %
FEV6-%PRED-PRE: 67 %
FEV6-POST: 1.67 L
FEV6-PRE: 1.6 L
FEV6FVC-%PRED-PRE: 104 %
FEV6FVC-%Pred-Post: 104 %
FVC-%Change-Post: 4 %
FVC-%PRED-POST: 67 %
FVC-%Pred-Pre: 64 %
FVC-Post: 1.67 L
FVC-Pre: 1.6 L
POST FEV1/FVC RATIO: 79 %
POST FEV6/FVC RATIO: 100 %
PRE FEV6/FVC RATIO: 100 %
Pre FEV1/FVC ratio: 77 %
RV % pred: 135 %
RV: 2.47 L
TLC % PRED: 98 %
TLC: 4.15 L

## 2017-09-25 MED ORDER — FLUTICASONE FUROATE-VILANTEROL 100-25 MCG/INH IN AEPB: 1.0000 | INHALATION_SPRAY | Freq: Every day | RESPIRATORY_TRACT | 0 refills | Status: DC

## 2017-09-25 NOTE — Progress Notes (Signed)
Brenda Dunn    [id number]    10/15/1950  Primary Care Physician:Henson, Zorita Pang, [name]  Referring Physician: Avanell Shackleton, [name] [address] [place], [place] [address]  Chief complaint: Follow-up for emphysema, PE  HPI: 67 year old smoker with emphysema, hypertension, hyperlipidemia, hypothyroidism.  She is had multiple admissions over the past 6 months.  Admitted in December after she had a small PE.  Readmitted in January 2019 with COPD exacerbation.  Treated with Levaquin, steroids.  She continues on Eliquis for PE Since her discharge she reports that her breathing is slowly getting better.  She still has chronic cough with white mucus.  Denies any fevers, chills, wheezing.  Outpatient Encounter Medications as of 09/25/2017  Medication Sig  . albuterol (PROVENTIL HFA;VENTOLIN HFA) 108 (90 Base) MCG/ACT inhaler Inhale 2 puffs into the lungs every 4 (four) hours as needed for wheezing or shortness of breath.  Marland Kitchen apixaban (ELIQUIS) 5 MG TABS tablet Take 5 mg twice a day.  Marland Kitchen atorvastatin (LIPITOR) 40 MG tablet TAKE 1 TABLET BY MOUTH DAILY AT BEDTIME  . budesonide-formoterol (SYMBICORT) 80-4.5 MCG/ACT inhaler Inhale 2 puffs into the lungs 2 (two) times daily.  . diazepam (VALIUM) 5 MG tablet Take 5 mg by mouth. Take 1 tablet in the am, 1/2 tablet at lunch and 1 tablet at night  . Docusate Calcium (STOOL SOFTENER PO) Take 3 tablets by mouth daily.  Marland Kitchen gabapentin (NEURONTIN) 300 MG capsule Take 300 mg by mouth 4 (four) times daily. 300 MG IN THE MORNING, 300 MG AT LUNCH AND 600 MG AT BEDTIME  . levothyroxine (SYNTHROID, LEVOTHROID) 88 MCG tablet TAKE 1 TABLET EVERY DAY ON EMPTY STOMACH  . LINZESS 290 MCG CAPS capsule TAKE 1 CAPSULE BY MOUTH ONCE A DAY  . lisinopril (PRINIVIL,ZESTRIL) 40 MG tablet Take 40 mg by mouth daily.  . mirtazapine (REMERON) 45 MG tablet Take 15 mg by mouth at bedtime.   Marland Kitchen MYRBETRIQ 50 MG TB24 tablet TAKE 1 TABLET BY MOUTH EVERY DAY  . omeprazole  (PRILOSEC) 40 MG capsule Take 40 mg by mouth daily.  . [DISCONTINUED] OXcarbazepine (TRILEPTAL) 150 MG tablet Take 450 mg by mouth at bedtime.  Marland Kitchen ipratropium-albuterol (DUONEB) 0.5-2.5 (3) MG/3ML SOLN Take 3 mLs by nebulization 3 (three) times daily. (Patient not taking: Reported on 09/25/2017)   No facility-administered encounter medications on file as of 09/25/2017.     Allergies as of 09/25/2017  . (No Known Allergies)    Past Medical History:  Diagnosis Date  . Anxiety   . Benzodiazepine dependence (HCC)   . Colonic polyp    mulitple polyps. repeat in 05/2019  . Constipation    IBS  . COPD (chronic obstructive pulmonary disease) (HCC)   . Depression   . GERD (gastroesophageal reflux disease)   . Hyperlipidemia   . Hypertension   . Insomnia   . Migraine headache   . OAB (overactive bladder)   . Osteoarthritis   . Post-surgical hypothyroidism   . Scoliosis     Past Surgical History:  Procedure Laterality Date  . HEMORRHOID SURGERY    . TH[facility]ROIDECTOMY     at [facility]  . TUBAL LIGATION  1979    Family History  Problem Relation Age of Onset  . Breast cancer Mother   . Lung cancer Maternal Uncle   . Lung cancer Maternal Grandmother     Social History   Socioeconomic History  . Marital status: Married    Spouse  name: Not on file  . Number of children: Not on file  . Years of education: Not on file  . Highest education level: Not on file  Social Needs  . Financial resource strain: Not on file  . Food insecurity - worry: Not on file  . Food insecurity - inability: Not on file  . Transportation needs - medical: Not on file  . Transportation needs - non-medical: Not on file  Occupational History  . Occupation: disabled due "my nerves"  Tobacco Use  . Smoking status: Current Every Day Smoker    Packs/day: 0.50    Years: 44.00    Pack years: 22.00    Types: Cigarettes  . Smokeless tobacco: Never Used  Substance and Sexual Activity  . Alcohol use: No  . Drug  use: No  . Sexual activity: Not on file  Other Topics Concern  . Not on file  Social History Narrative  . Not on file    Review of systems: Review of Systems  Constitutional: Negative for fever and chills.  HENT: Negative.   Eyes: Negative for blurred vision.  Respiratory: as per HPI  Cardiovascular: Negative for chest pain and palpitations.  Gastrointestinal: Negative for vomiting, diarrhea, blood per rectum. Genitourinary: Negative for dysuria, urgency, frequency and hematuria.  Musculoskeletal: Negative for myalgias, back pain and joint pain.  Skin: Negative for itching and rash.  Neurological: Negative for dizziness, tremors, focal weakness, seizures and loss of consciousness.  Endo/Heme/Allergies: Negative for environmental allergies.  Psychiatric/Behavioral: Negative for depression, suicidal ideas and hallucinations.  All other systems reviewed and are negative.  Physical Exam: Blood pressure 106/62, pulse 89, height 4' 11.5" (1.511 m), weight 128 lb (58.1 kg), SpO2 96 %. Gen:      No acute distress HEENT:  EOMI, sclera anicteric Neck:     No masses; no thyromegaly Lungs:    Clear to auscultation bilaterally; normal respiratory effort CV:         Regular rate and rhythm; no murmurs Abd:      + bowel sounds; soft, non-tender; no palpable masses, no distension Ext:    No edema; adequate peripheral perfusion Skin:      Warm and dry; no rash Neuro: alert and oriented x 3 Psych: normal mood and affect  Data Reviewed: PFTs 09/25/17 FVC 1.67 [7%], FEV1 1.33 (90%], F/F 79, TLC 98%, DLCO 54% No obstruction, moderate diffusion defect  CT chest 05/29/17-emphysema, 5 mm nodule in the lingula. CTA 07/15/17- emphysema, left lower lobe embolism, stable 5 mm lingular nodule.  I have reviewed the images personally.  Assessment:  Chronic bronchitis, emphysema PFTs reviewed today which do not show obstruction.  There is severe reduction in diffusion capacity.  No evidence of  interstitial lung disease on CT scans. Continue Mucinex, Symbicort.  Pulmonary embolism On Eliquis.  Presumed to be provoked as she had gone on a cruise prior to this incident however patient states that she was not immobile during her travel.  She may need lifelong anticoagulation as long as there is no contraindication.  Active smoker Encouraged to quit smoking but she is not interested.  She continues to vape  Subcentimeter pulmonary nodule Follow-up CT in November 2019.  Plan/Recommendations: - Continue Mucinex, Symbicort - Smoking cessation - Continue Eliquis - Follow-up CT chest.  Chilton GreathousePraveen Jossette Zirbel MD [facility] Pager [phone] 09/25/2017, 12:25 PM  CC: Avanell ShackletonHenson, [name], [name]

## 2017-09-25 NOTE — Patient Instructions (Addendum)
Continue using the Symbicort and Eliquis Follow up CT of the chest  Follow-up in 6 months

## 2017-09-25 NOTE — Progress Notes (Signed)
PFT done today. 

## 2017-09-26 ENCOUNTER — Telehealth: Payer: Self-pay | Admitting: Pulmonary Disease

## 2017-09-26 NOTE — Telephone Encounter (Signed)
Spoke with pt, I advised her to keep taking the Symbicort. She would also like to know if she has to stop the Eliquis if she has oral surgery on her tooth. PM please advise.    Patient Instructions by Chilton GreathouseMannam, Praveen, MD at 09/25/2017 12:00 PM   Author: Chilton GreathouseMannam, Praveen, MD Author Type: Physician Filed: 09/25/2017 1:47 PM  Note Status: Addendum Sebastian Acheosign: Cosign Not Required Encounter Date: 09/25/2017  Editor: Chilton GreathouseMannam, Praveen, MD (Physician)  Prior Versions: 1. Chilton GreathouseMannam, Praveen, MD (Physician) at 09/25/2017 12:50 PM - Signed    Continue using the Symbicort and Eliquis Follow up CT of the chest  Follow-up in 6 months

## 2017-09-26 NOTE — Telephone Encounter (Signed)
Called and spoke with pt letting her know that it was okay to continue symbicort but to stop taking eliquis if she has to have oral surgery.  Stated to pt she can resume taking the eliquis after surgery if she has to have it.  Pt expressed understanding. nothing further needed at this current time.

## 2017-09-26 NOTE — Telephone Encounter (Signed)
Yes. Continue symbicort Ok to stop eliquis for oral surgery and resume after surgery.

## 2017-09-28 DIAGNOSIS — J441 Chronic obstructive pulmonary disease with (acute) exacerbation: Secondary | ICD-10-CM | POA: Diagnosis not present

## 2017-10-02 ENCOUNTER — Encounter: Payer: Self-pay | Admitting: Family Medicine

## 2017-10-02 ENCOUNTER — Ambulatory Visit (INDEPENDENT_AMBULATORY_CARE_PROVIDER_SITE_OTHER): Payer: PPO | Admitting: Family Medicine

## 2017-10-02 ENCOUNTER — Telehealth: Payer: Self-pay | Admitting: Pulmonary Disease

## 2017-10-02 VITALS — BP 106/60 | HR 88 | Ht 59.0 in | Wt 127.4 lb

## 2017-10-02 DIAGNOSIS — J449 Chronic obstructive pulmonary disease, unspecified: Secondary | ICD-10-CM | POA: Diagnosis not present

## 2017-10-02 DIAGNOSIS — M79661 Pain in right lower leg: Secondary | ICD-10-CM | POA: Diagnosis not present

## 2017-10-02 DIAGNOSIS — F17200 Nicotine dependence, unspecified, uncomplicated: Secondary | ICD-10-CM | POA: Diagnosis not present

## 2017-10-02 DIAGNOSIS — Z7901 Long term (current) use of anticoagulants: Secondary | ICD-10-CM | POA: Diagnosis not present

## 2017-10-02 DIAGNOSIS — Z86711 Personal history of pulmonary embolism: Secondary | ICD-10-CM | POA: Diagnosis not present

## 2017-10-02 DIAGNOSIS — M79662 Pain in left lower leg: Secondary | ICD-10-CM

## 2017-10-02 DIAGNOSIS — R252 Cramp and spasm: Secondary | ICD-10-CM | POA: Diagnosis not present

## 2017-10-02 NOTE — Telephone Encounter (Signed)
Pt was calling to f/u on CT. I have made pt aware that CT was to be preformed in 57mo.  Per records CT has been scheduled for 05/2018.  [facility] to [facility]. Pt also wanted to make Dr. Isaiah SergeMannam aware that she will be seeing PCP this afternoon for leg cramping. Pt is concerned about DVT.  Nothing further is needed at this time.  Will route to Dr. Isaiah SergeMannam to make aware.

## 2017-10-02 NOTE — Telephone Encounter (Signed)
Noted. Thanks.

## 2017-10-02 NOTE — Progress Notes (Signed)
Chief Complaint  Patient presents with  . Leg cramping    this weekend had some foot cramping, had a cramping in her hand as well. At 2am she has cramps in her calves for over 2.5hrs that were so painful and they are still hurting, sore. Has hx of blood clot and is on eliquis.     Friday she had some cramps in her right foot, was able to "work it out". Yesterday she started having pain in her left fingers, also able to "work it out". At 2 this morning her feet and legs had "charley horse". It was very painful, feet were curled up and both calves were cramped.  Lasted until 4:30 in the morning. She still has some discomfort, more of an aching, persistent in both calves. Feet haven't cramped again today.  Denies change in shoes, change in activity. Reports drinking fluids, staying hydrated. No change in caffeine.  Doesn't drink alcohol.  H/o PE (07/2017), reports compliance with her blood thinners.  She is anxious, very worried about clots, and worried about pneumonia. Denies cough, fevers, shortness of breath or chest pain. Cutting back on smoking, switching to vaping.  She last saw pulmonary last week, had PFT's (showing reduction in diffusion capacity, not obstruction per their note). She also has a pulmonary nodule, with CT f/u planned for 6 mos.  PMH, PSH, SH reviewed Outpatient Encounter Medications as of 10/02/2017  Medication Sig  . apixaban (ELIQUIS) 5 MG TABS tablet Take 5 mg twice a day.  Marland Kitchen atorvastatin (LIPITOR) 40 MG tablet TAKE 1 TABLET BY MOUTH DAILY AT BEDTIME  . budesonide-formoterol (SYMBICORT) 80-4.5 MCG/ACT inhaler Inhale 2 puffs into the lungs 2 (two) times daily.  . diazepam (VALIUM) 5 MG tablet Take 5 mg by mouth. Take 1 tablet in the am, 1/2 tablet at lunch and 1 tablet at night  . Docusate Calcium (STOOL SOFTENER PO) Take 3 tablets by mouth daily.  Marland Kitchen gabapentin (NEURONTIN) 300 MG capsule Take 300 mg by mouth 4 (four) times daily. 300 MG IN THE MORNING, 300 MG AT LUNCH  AND 600 MG AT BEDTIME  . levothyroxine (SYNTHROID, LEVOTHROID) 88 MCG tablet TAKE 1 TABLET EVERY DAY ON EMPTY STOMACH  . LINZESS 290 MCG CAPS capsule TAKE 1 CAPSULE BY MOUTH ONCE A DAY  . lisinopril (PRINIVIL,ZESTRIL) 40 MG tablet Take 40 mg by mouth daily.  . mirtazapine (REMERON) 15 MG tablet Take 15 mg by mouth at bedtime.  Marland Kitchen MYRBETRIQ 50 MG TB24 tablet TAKE 1 TABLET BY MOUTH EVERY DAY  . omeprazole (PRILOSEC) 40 MG capsule Take 40 mg by mouth daily.  . [DISCONTINUED] mirtazapine (REMERON) 45 MG tablet Take 15 mg by mouth at bedtime.   Marland Kitchen albuterol (PROVENTIL HFA;VENTOLIN HFA) 108 (90 Base) MCG/ACT inhaler Inhale 2 puffs into the lungs every 4 (four) hours as needed for wheezing or shortness of breath. (Patient not taking: Reported on 10/02/2017)  . ipratropium-albuterol (DUONEB) 0.5-2.5 (3) MG/3ML SOLN Take 3 mLs by nebulization 3 (three) times daily. (Patient not taking: Reported on 09/25/2017)   No facility-administered encounter medications on file as of 10/02/2017.    No Known Allergies  ROS: no fever, chills, URI symptoms. No cough, shortness of breath, chest pain, vomiting, diarrhea, bleeding, bruising. Denies pain in legs with exertion, pain occurred while sleeping, and chronic aching throughout the day. She denies edema.  PHYSICAL EXAM: BP 106/60   Pulse 88   Ht 4\' 11"  (1.499 m)   Wt 127 lb 6.4 oz (57.8 kg)  BMI 25.73 kg/m    Very talkative female, accompanied by her husband with tobacco smoke odor filling the room.  She frequently interrupts. She is in no distress, somewhat anxious Neck: no lymphadenopathy or mass Heart: regular rate and rhythm Lungs: fair air movement, no wheezes. Slight crackles at right base, improved after deep breaths. No ronchi. Extremities: 2+ pulses on left, slightly diminished D on the right, both feet are warm, brisk cap refill.  There is no edema. She points out a slightly prominent vein at the left calf, which is compressible, soft, no cord. She is  diffusely tender over calf muscles, nonfocal, no cords, no erythema, induration.  No other abnormalities noted on extremity exam Neuro: alert and oriented, normal gait, normal sensation Psych: somewhat anxious. Normal hygiene and grooming. Normal eye contact. Normal speech, but frequently interrups   ASSESSMENT/PLAN:  Leg cramps - and foot spasms--encouraged hydration; check electrolytes - Plan: Basic metabolic panel, Magnesium  Bilateral calf pain - diffuse, mild. normal exam. heat/stretches/topical meds. no e/o DVT. on eliquis  Personal history of pulmonary embolism - continue eliquis.  reassured that I do not suspect recurrent PE or pneumonia. f/u if pulmonary sx develop  Chronic anticoagulation  Chronic obstructive pulmonary disease, unspecified COPD type (HCC)  Smoker - encouraged tobacco cessation   Drink lots of water. Being dehydrated will increase leg cramps and spasms. We are checking electrolytes to look for an underlying reason why you might be cramping. Your calf muscles are sore---there is no evidence of blood clot. Try heat and stretches for the calf muscles. You can also try topical creams such as Biofreeze or Icy Hot.

## 2017-10-02 NOTE — Patient Instructions (Signed)
  Drink lots of water. Being dehydrated will increase leg cramps and spasms. We are checking electrolytes to look for an underlying reason why you might be cramping. Your calf muscles are sore---there is no evidence of blood clot. Try heat and stretches for the calf muscles. You can also try topical creams such as Biofreeze or Icy Hot.  Please work on quitting smoking.

## 2017-10-03 LAB — MAGNESIUM: Magnesium: 2 mg/dL (ref 1.6–2.3)

## 2017-10-03 LAB — BASIC METABOLIC PANEL
BUN/Creatinine Ratio: 14 (ref 12–28)
BUN: 11 mg/dL (ref 8–27)
CO2: 21 mmol/L (ref 20–29)
Calcium: 9.6 mg/dL (ref 8.7–10.3)
Chloride: 103 mmol/L (ref 96–106)
Creatinine, Ser: 0.79 mg/dL (ref 0.57–1.00)
GFR calc Af Amer: 91 mL/min/{1.73_m2} (ref >59)
GFR, EST NON AFRICAN AMERICAN: 79 mL/min/{1.73_m2} (ref >59)
Glucose: 91 mg/dL (ref 65–99)
POTASSIUM: 4.1 mmol/L (ref 3.5–5.2)
Sodium: 140 mmol/L (ref 134–144)

## 2017-10-20 ENCOUNTER — Telehealth: Payer: Self-pay | Admitting: Family Medicine

## 2017-10-20 MED ORDER — OMEPRAZOLE 40 MG PO CPDR: 40.0000 mg | DELAYED_RELEASE_CAPSULE | Freq: Every day | ORAL | 2 refills | Status: DC

## 2017-10-20 NOTE — Telephone Encounter (Signed)
New Message   *STAT* If patient is at the pharmacy, call can be transferred to refill team.   1. Which medications need to be refilled? (please list name of each medication and dose if known)  Omeprazole (Prilosec) 40 mg capsule once daily  2. Which pharmacy/location (including street and city if local pharmacy) is medication to be sent to? [facility] [address], [place], [place] [id number]  3. Do they need a 30 day or 90 day supply?  30 days

## 2017-10-20 NOTE — Telephone Encounter (Signed)
done

## 2017-10-28 DIAGNOSIS — J441 Chronic obstructive pulmonary disease with (acute) exacerbation: Secondary | ICD-10-CM | POA: Diagnosis not present

## 2017-11-06 ENCOUNTER — Other Ambulatory Visit: Payer: Self-pay | Admitting: Family Medicine

## 2017-11-06 ENCOUNTER — Telehealth: Payer: Self-pay | Admitting: Internal Medicine

## 2017-11-06 DIAGNOSIS — Z1231 Encounter for screening mammogram for malignant neoplasm of breast: Secondary | ICD-10-CM

## 2017-11-07 ENCOUNTER — Encounter: Payer: Self-pay | Admitting: Internal Medicine

## 2017-11-08 ENCOUNTER — Ambulatory Visit (INDEPENDENT_AMBULATORY_CARE_PROVIDER_SITE_OTHER): Payer: PPO

## 2017-11-08 DIAGNOSIS — Z1231 Encounter for screening mammogram for malignant neoplasm of breast: Secondary | ICD-10-CM

## 2017-11-24 ENCOUNTER — Encounter: Payer: Self-pay | Admitting: Family Medicine

## 2017-11-24 ENCOUNTER — Ambulatory Visit (INDEPENDENT_AMBULATORY_CARE_PROVIDER_SITE_OTHER): Payer: PPO | Admitting: Family Medicine

## 2017-11-24 VITALS — BP 120/76 | HR 79 | Temp 98.8°F | Resp 18 | Ht 59.5 in | Wt 131.4 lb

## 2017-11-24 DIAGNOSIS — Z720 Tobacco use: Secondary | ICD-10-CM | POA: Diagnosis not present

## 2017-11-24 DIAGNOSIS — E2839 Other primary ovarian failure: Secondary | ICD-10-CM

## 2017-11-24 DIAGNOSIS — J302 Other seasonal allergic rhinitis: Secondary | ICD-10-CM | POA: Insufficient documentation

## 2017-11-24 MED ORDER — LEVOCETIRIZINE DIHYDROCHLORIDE 5 MG PO TABS: 5.0000 mg | ORAL_TABLET | Freq: Every evening | ORAL | 1 refills | Status: DC

## 2017-11-24 NOTE — Patient Instructions (Addendum)
If you do not hear anything about your bone density scan in the next 1-2 weeks, call our office and ask for an update.  Stop the Allegra.   Stop smoking.  Let us know if you need anything.

## 2017-11-24 NOTE — Progress Notes (Signed)
Chief Complaint  Patient presents with  . Establish Care       New Patient Visit SUBJECTIVE: HPI: Brenda Dunn is an 67 y.o.female who is being seen for establishing care.  [facility]  Patient has a history of seasonal allergies.  They have been bothering her since March of this year.  She has been on Zyrtec, Claritin, and is currently taking Allegra.  She is still having a scratchy voice which she believes could be contributing to this.  She is unable to take nasal sprays she does not like them.  She also has a history of rectal pain.  She reached out to a gastroenterologist who reviewed records and noted that she does not need a follow-up colonoscopy until 2020.  She did have 9 polyps from her most recent colonoscopy in October 2017.  She has a history of anxiety for which she follows the psychiatry team.  She is also due for a bone density scan and is requesting this be done.  No Known Allergies  Past Medical History:  Diagnosis Date  . Anxiety   . Benzodiazepine dependence (HCC)   . Colonic polyp    mulitple polyps. repeat in 05/2019  . Constipation    IBS  . COPD (chronic obstructive pulmonary disease) (HCC)   . Depression   . GERD (gastroesophageal reflux disease)   . Hyperlipidemia   . Hypertension   . Insomnia   . Migraine headache   . OAB (overactive bladder)   . Osteoarthritis   . Post-surgical hypothyroidism   . Scoliosis    Past Surgical History:  Procedure Laterality Date  . BREAST CYST ASPIRATION Left   . HEMORRHOID SURGERY    . [facility]     [facility]  . TUBAL LIGATION  1979    Family History  Problem Relation Age of Onset  . Breast cancer Mother   . Arthritis Mother   . Hearing loss Mother   . Hyperlipidemia Mother   . Hypertension Mother   . Lung cancer Maternal Uncle   . Lung cancer Maternal Grandmother   . Heart attack Maternal Grandmother   . Asthma Father   . COPD Father   . Hyperlipidemia Father   .  Hypertension Father   . Anxiety disorder Sister   . Hypertension Sister   . Thyroid cancer Sister   . Anxiety disorder Brother   . Hypertension Brother   . Drug abuse Son   . Anxiety disorder Sister   . Hypertension Sister   . Anxiety disorder Sister   . Hypertension Sister   . Hyperlipidemia Son      Current Outpatient Medications:  .  albuterol (PROVENTIL HFA;VENTOLIN HFA) 108 (90 Base) MCG/ACT inhaler, Inhale 2 puffs into the lungs every 4 (four) hours as needed for wheezing or shortness of breath., Disp: 1 Inhaler, Rfl: 1 .  apixaban (ELIQUIS) 5 MG TABS tablet, Take 5 mg twice a day. (Patient taking differently: Take 5 mg twice a day.  Takes one in the morning and one in the evening), Disp: 60 tablet, Rfl: 2 .  atorvastatin (LIPITOR) 40 MG tablet, TAKE 1 TABLET BY MOUTH DAILY AT BEDTIME, Disp: 90 tablet, Rfl: 0 .  budesonide-formoterol (SYMBICORT) 80-4.5 MCG/ACT inhaler, Inhale 2 puffs into the lungs 2 (two) times daily., Disp: 1 Inhaler, Rfl: 5 .  diazepam (VALIUM) 5 MG tablet, Take 5 mg by mouth. Take 1 tablet in the am, 1/2 tablet at lunch and 1 tablet  at night, Disp: , Rfl:  .  Docusate Calcium (STOOL SOFTENER PO), Take 3 tablets by mouth daily., Disp: , Rfl:  .  gabapentin (NEURONTIN) 300 MG capsule, Take 300 mg by mouth 4 (four) times daily. 300 MG IN THE MORNING, 300 MG AT LUNCH AND 600 MG AT BEDTIME, Disp: , Rfl:  .  levothyroxine (SYNTHROID, LEVOTHROID) 88 MCG tablet, TAKE 1 TABLET EVERY DAY ON EMPTY STOMACH, Disp: , Rfl: 3 .  LINZESS 290 MCG CAPS capsule, TAKE 1 CAPSULE BY MOUTH ONCE A DAY, Disp: 90 capsule, Rfl: 0 .  lisinopril (PRINIVIL,ZESTRIL) 40 MG tablet, Take 40 mg by mouth daily., Disp: , Rfl:  .  mirtazapine (REMERON) 15 MG tablet, Take 15 mg by mouth at bedtime., Disp: , Rfl:  .  MYRBETRIQ 50 MG TB24 tablet, TAKE 1 TABLET BY MOUTH EVERY DAY, Disp: 90 tablet, Rfl: 1 .  omeprazole (PRILOSEC) 40 MG capsule, Take 1 capsule (40 mg total) by mouth daily., Disp: 30  capsule, Rfl: 2 .  Probiotic Product (PROBIOTIC DAILY PO), Take 2 tablets by mouth daily., Disp: , Rfl:  .  levocetirizine (XYZAL) 5 MG tablet, Take 1 tablet (5 mg total) by mouth every evening., Disp: 30 tablet, Rfl: 1  No LMP recorded. Patient is postmenopausal.  ROS HEENT: Denies nasal congestion  GI: +rectal pain   OBJECTIVE: BP 120/76 (BP Location: Left Arm, Patient Position: Sitting, Cuff Size: Normal)   Pulse 79   Temp 98.8 F (37.1 C) (Oral)   Resp 18   Ht 4' 11.5" (1.511 m)   Wt 131 lb 6 oz (59.6 kg)   SpO2 96%   BMI 26.09 kg/m   Constitutional: -  VS reviewed -  Well developed, well nourished, appears stated age -  No apparent distress  Psychiatric: -  Oriented to person, place, and time -  Memory intact -  Affect and mood normal -  Fluent conversation, good eye contact -  Judgment and insight age appropriate  Eye: -  Conjunctivae clear, no discharge -  Pupils symmetric, round, reactive to light  ENMT: -  MMM    Pharynx moist, no exudate, no erythema  Neck: -  No gross swelling, no palpable masses -  Thyroid midline, not enlarged, mobile, no palpable masses  Cardiovascular: -  RRR -  No bruits -  No LE edema  Respiratory: -  Normal respiratory effort, no accessory muscle use, no retraction -  Breath sounds equal, no wheezes, no ronchi, no crackles  Skin: -  No significant lesion on inspection -  Warm and dry to palpation   ASSESSMENT/PLAN: Seasonal allergies - Plan: levocetirizine (XYZAL) 5 MG tablet  Estrogen deficiency - Plan: DG Bone Density  Tobacco abuse  Patient instructed to sign release of records form from her previous PCP. Change Allegra to Xyzal.  Will consider Singulair in the future versus reflux for her voice issue.  This does seem like this is due to allergies given the timing and associated symptoms.  Could consider reflux in the future or possibly ENT referral for nasolaryngoscopy. Recommended she schedule a consultation with the  gastroenterology team regarding her rectal pain and desire for a colonoscopy. Patient should return in 5 weeks to recheck voice. The patient voiced understanding and agreement to the plan.   Jilda Roche [place], DO 11/24/17  5:02 PM

## 2017-11-24 NOTE — Progress Notes (Signed)
Pre visit review using our clinic review tool, if applicable. No additional management support is needed unless otherwise documented below in the visit note. 

## 2017-11-28 ENCOUNTER — Other Ambulatory Visit: Payer: Self-pay | Admitting: Family Medicine

## 2017-11-28 DIAGNOSIS — J441 Chronic obstructive pulmonary disease with (acute) exacerbation: Secondary | ICD-10-CM | POA: Diagnosis not present

## 2017-11-28 NOTE — Telephone Encounter (Signed)
Looks like pt has a different PC on file so I will wait on call back from pt before filling med. KH

## 2017-11-28 NOTE — Telephone Encounter (Signed)
Called pt to see I she was out of atorvastatin. No answer LVM to call the office . KH Will send script in. [facility]

## 2017-11-29 ENCOUNTER — Telehealth: Payer: Self-pay | Admitting: Internal Medicine

## 2017-11-29 MED ORDER — ATORVASTATIN CALCIUM 40 MG PO TABS: 40.0000 mg | ORAL_TABLET | Freq: Every day | ORAL | 0 refills | Status: DC

## 2017-11-29 NOTE — Telephone Encounter (Signed)
Pt called and needs a refill for 30 days. She will follow-up with her NEW PCP for future meds.

## 2017-12-03 ENCOUNTER — Other Ambulatory Visit: Payer: Self-pay | Admitting: Family Medicine

## 2017-12-03 DIAGNOSIS — Z7901 Long term (current) use of anticoagulants: Secondary | ICD-10-CM

## 2017-12-03 DIAGNOSIS — Z86711 Personal history of pulmonary embolism: Secondary | ICD-10-CM

## 2017-12-04 DIAGNOSIS — F411 Generalized anxiety disorder: Secondary | ICD-10-CM | POA: Diagnosis not present

## 2017-12-11 ENCOUNTER — Telehealth: Payer: Self-pay

## 2017-12-11 NOTE — Telephone Encounter (Signed)
Pt is no longer our patient here. deny

## 2017-12-11 NOTE — Telephone Encounter (Signed)
Received fax request for refill on Eliquis 5 mg tabs Qty 60 with 2 refills sent to [facility]

## 2017-12-13 ENCOUNTER — Ambulatory Visit (INDEPENDENT_AMBULATORY_CARE_PROVIDER_SITE_OTHER): Payer: PPO

## 2017-12-13 DIAGNOSIS — M85852 Other specified disorders of bone density and structure, left thigh: Secondary | ICD-10-CM | POA: Diagnosis not present

## 2017-12-13 DIAGNOSIS — E2839 Other primary ovarian failure: Secondary | ICD-10-CM

## 2017-12-14 ENCOUNTER — Ambulatory Visit: Payer: PPO | Admitting: Family Medicine

## 2017-12-15 ENCOUNTER — Other Ambulatory Visit: Payer: Self-pay | Admitting: Family Medicine

## 2017-12-15 DIAGNOSIS — Z7901 Long term (current) use of anticoagulants: Secondary | ICD-10-CM

## 2017-12-15 DIAGNOSIS — Z86711 Personal history of pulmonary embolism: Secondary | ICD-10-CM

## 2017-12-16 ENCOUNTER — Other Ambulatory Visit: Payer: Self-pay | Admitting: Family Medicine

## 2017-12-16 DIAGNOSIS — J302 Other seasonal allergic rhinitis: Secondary | ICD-10-CM

## 2017-12-19 ENCOUNTER — Ambulatory Visit (INDEPENDENT_AMBULATORY_CARE_PROVIDER_SITE_OTHER): Payer: PPO | Admitting: Family

## 2017-12-19 ENCOUNTER — Encounter: Payer: Self-pay | Admitting: Family

## 2017-12-19 ENCOUNTER — Ambulatory Visit (HOSPITAL_BASED_OUTPATIENT_CLINIC_OR_DEPARTMENT_OTHER)
Admission: RE | Admit: 2017-12-19 | Discharge: 2017-12-19 | Disposition: A | Discharge status: Home / Self Care | Payer: PPO | Source: Ambulatory Visit | Attending: Family | Admitting: Family

## 2017-12-19 VITALS — BP 105/69 | HR 73 | Temp 98.4°F | Resp 16 | Ht 59.0 in | Wt 132.6 lb

## 2017-12-19 DIAGNOSIS — Z86718 Personal history of other venous thrombosis and embolism: Secondary | ICD-10-CM | POA: Insufficient documentation

## 2017-12-19 DIAGNOSIS — M810 Age-related osteoporosis without current pathological fracture: Secondary | ICD-10-CM | POA: Diagnosis not present

## 2017-12-19 DIAGNOSIS — M79605 Pain in left leg: Secondary | ICD-10-CM

## 2017-12-19 DIAGNOSIS — R490 Dysphonia: Secondary | ICD-10-CM | POA: Diagnosis not present

## 2017-12-19 DIAGNOSIS — M79662 Pain in left lower leg: Secondary | ICD-10-CM | POA: Diagnosis not present

## 2017-12-19 HISTORY — DX: Age-related osteoporosis without current pathological fracture: M81.0

## 2017-12-19 MED ORDER — CALCIUM CARBONATE-VITAMIN D 600-400 MG-UNIT PO TABS: 1.0000 | ORAL_TABLET | Freq: Two times a day (BID) | ORAL | 11 refills | Status: DC

## 2017-12-19 MED ORDER — ALENDRONATE SODIUM 70 MG PO TABS: 70.0000 mg | ORAL_TABLET | ORAL | 11 refills | Status: DC

## 2017-12-19 MED ORDER — MONTELUKAST SODIUM 10 MG PO TABS: 10.0000 mg | ORAL_TABLET | Freq: Every day | ORAL | 3 refills | Status: DC

## 2017-12-19 NOTE — Progress Notes (Signed)
Subjective:    Patient ID: Brenda Dunn, female    DOB: [birth year], 67 y.o.   MRN: [id number]  HPI  Brenda Dunn is a 67 yr old female who presents today with chief complaint of left sided leg pain. She reports that she was initially seen for this complaint on 10/02/17.  She had bmet and magnesium checked at this time and these tests were normal.  Notes that on 5/8 she noticed a varicose vein on the right posterior calf.  She had PE in 12/18. She has been on eliquis ever since.  She reports chronic back pain/hip pain, bunion left foot.Has aching pain in the left leg.    Voice hoarseness-  Using xyzal without improvement. Reports + post nasal drip.   Review of Systems See HPI  Past Medical History:  Diagnosis Date  . Anxiety   . Benzodiazepine dependence (HCC)   . Colonic polyp    mulitple polyps. repeat in 05/2019  . Constipation    IBS  . COPD (chronic obstructive pulmonary disease) (HCC)   . Depression   . GERD (gastroesophageal reflux disease)   . Hyperlipidemia   . Hypertension   . Insomnia   . Migraine headache   . OAB (overactive bladder)   . Osteoarthritis   . Post-surgical hypothyroidism   . Scoliosis      Social History   Socioeconomic History  . Marital status: Married    Spouse name: Not on file  . Number of children: Not on file  . Years of education: Not on file  . Highest education level: Not on file  Occupational History  . Occupation: disabled due "my nerves"  Social Needs  . Financial resource strain: Not on file  . Food insecurity:    Worry: Not on file    Inability: Not on file  . Transportation needs:    Medical: Not on file    Non-medical: Not on file  Tobacco Use  . Smoking status: Current Every Day Smoker    Packs/day: 0.50    Years: 44.00    Pack years: 22.00    Types: Cigarettes  . Smokeless tobacco: Never Used  Substance and Sexual Activity  . Alcohol use: No  . Drug use: No  . Sexual activity: Not on file  Lifestyle  . Physical  activity:    Days per week: Not on file    Minutes per session: Not on file  . Stress: Not on file  Relationships  . Social connections:    Talks on phone: Not on file    Gets together: Not on file    Attends religious service: Not on file    Active member of club or organization: Not on file    Attends meetings of clubs or organizations: Not on file    Relationship status: Not on file  . Intimate partner violence:    Fear of current or ex partner: Not on file    Emotionally abused: Not on file    Physically abused: Not on file    Forced sexual activity: Not on file  Other Topics Concern  . Not on file  Social History Narrative  . Not on file    Past Surgical History:  Procedure Laterality Date  . BREAST CYST ASPIRATION Left   . HEMORRHOID SURGERY    . THYROIDECTOMY     at [facility]  . TUBAL LIGATION  1979    Family History  Problem Relation Age of Onset  . Breast  cancer Mother   . Arthritis Mother   . Hearing loss Mother   . Hyperlipidemia Mother   . Hypertension Mother   . Lung cancer Maternal Uncle   . Lung cancer Maternal Grandmother   . Heart attack Maternal Grandmother   . Asthma Father   . COPD Father   . Hyperlipidemia Father   . Hypertension Father   . Anxiety disorder Sister   . Hypertension Sister   . Thyroid cancer Sister   . Anxiety disorder Brother   . Hypertension Brother   . Drug abuse Son   . Anxiety disorder Sister   . Hypertension Sister   . Anxiety disorder Sister   . Hypertension Sister   . Hyperlipidemia Son     No Known Allergies  Current Outpatient Medications on File Prior to Visit  Medication Sig Dispense Refill  . albuterol (PROVENTIL HFA;VENTOLIN HFA) 108 (90 Base) MCG/ACT inhaler Inhale 2 puffs into the lungs every 4 (four) hours as needed for wheezing or shortness of breath. 1 Inhaler 1  . atorvastatin (LIPITOR) 40 MG tablet Take 1 tablet (40 mg total) by mouth at bedtime. 30 tablet 0  . budesonide-formoterol (SYMBICORT) 80-4.5  MCG/ACT inhaler Inhale 2 puffs into the lungs 2 (two) times daily. 1 Inhaler 5  . diazepam (VALIUM) 5 MG tablet Take 5 mg by mouth. Take 1 tablet in the am, 1/2 tablet at lunch and 1 tablet at night    . Docusate Calcium (STOOL SOFTENER PO) Take 3 tablets by mouth daily.    Marland Kitchen ELIQUIS 5 MG TABS tablet TAKE 5 MG TWICE A DAY. 60 tablet 2  . gabapentin (NEURONTIN) 300 MG capsule Take 300 mg by mouth 4 (four) times daily. 300 MG IN THE MORNING, 300 MG AT LUNCH AND 600 MG AT BEDTIME    . levocetirizine (XYZAL) 5 MG tablet TAKE 1 TABLET BY MOUTH EVERY DAY IN THE EVENING 30 tablet 0  . levothyroxine (SYNTHROID, LEVOTHROID) 88 MCG tablet TAKE 1 TABLET EVERY DAY ON EMPTY STOMACH  3  . LINZESS 290 MCG CAPS capsule TAKE 1 CAPSULE BY MOUTH ONCE A DAY 90 capsule 0  . lisinopril (PRINIVIL,ZESTRIL) 40 MG tablet Take 40 mg by mouth daily.    . mirtazapine (REMERON) 45 MG tablet Take 45 mg by mouth at bedtime.  2  . MYRBETRIQ 50 MG TB24 tablet TAKE 1 TABLET BY MOUTH EVERY DAY 90 tablet 1  . omeprazole (PRILOSEC) 40 MG capsule Take 1 capsule (40 mg total) by mouth daily. 30 capsule 2  . Probiotic Product (PROBIOTIC DAILY PO) Take 2 tablets by mouth daily.    . QUEtiapine (SEROQUEL) 25 MG tablet TAKE 1 TO 2 TABLETS AT BEDTIME  2  . mirtazapine (REMERON) 15 MG tablet Take 15 mg by mouth at bedtime.     No current facility-administered medications on file prior to visit.     BP 105/69 (BP Location: Right Arm, Patient Position: Sitting, Cuff Size: Small)   Pulse 73   Temp 98.4 F (36.9 C) (Oral)   Resp 16   Ht  (1.499 m)   Wt 132 lb 9.6 oz (60.1 kg)   SpO2 97%   BMI 26.78 kg/m       Objective:   Physical Exam  Constitutional: She is oriented to person, place, and time. She appears well-developed and well-nourished.  HENT:  Head: Normocephalic and atraumatic.  Right Ear: Tympanic membrane and ear canal normal.  Left Ear: Tympanic membrane and ear canal normal.  Mouth/Throat: Oropharynx is clear  and moist. No oropharyngeal exudate, posterior oropharyngeal edema or posterior oropharyngeal erythema.  + voice hoarseness  Eyes: Pupils are equal, round, and reactive to light.  Cardiovascular: Normal rate, regular rhythm and normal heart sounds.  No murmur heard. Pulmonary/Chest: Effort normal and breath sounds normal. No respiratory distress. She has no wheezes.  Neurological: She is alert and oriented to person, place, and time.  Skin: Skin is warm and dry.  Psychiatric: She has a normal mood and affect. Her behavior is normal. Judgment and thought content normal.          Assessment & Plan:  Osteoporosis- reviewed frax scores at pt request from her recent dexa. She is still smoking, discussed importance of quitting smoking for her bone health. Also, will add fosamax and caltrate. Advised pt to sit upright x 90 minutes after taking fosamax.  Continue regular walking.  L calf pain- will refer for LLE doppler to rule out DVT, though unlikely since she reports + compliance with eliquis.  Voice hoarseness- she is already on PPI and denies current gerd symptoms.  Reports that she cannot take nasal sprays. No significant improvement with xyzal. Will add singulair. She is advised to follow up with PCP in 1 month. If no improvement consider referral to ENT.

## 2017-12-19 NOTE — Patient Instructions (Addendum)
Please add singulair for your post nasal drip. Continue omeprazole daily.  Begin fosamax once weekly and sit upright for 90 minutes after taking. Add caltrate  + D twice daily for bone health. Continue regular walking. It is important that you quite.

## 2017-12-21 IMAGING — CT CT ANGIO CHEST
2 of 7 series · 18 of 46 positions shown · IV contrast (ISOVUE 370)
Comparison: Plain film from 07/14/2017, CT from 05/29/2017.

CLINICAL DATA: Shortness of breath with cough for several days

EXAM:
CT ANGIOGRAPHY CHEST WITH CONTRAST
TECHNIQUE: Multidetector CT imaging of the chest was performed using the
standard protocol during bolus administration of intravenous
contrast. Multiplanar CT image reconstructions and MIPs were
obtained to evaluate the vascular anatomy.
CONTRAST:  100mL N7UW2R-1OY IOPAMIDOL (N7UW2R-1OY) INJECTION 76%

[Series 5: thins · axial · 0.71mm/px · z∈[-248,-13]mm · 16 of 259 slices shown]
[im 12/259  lung]
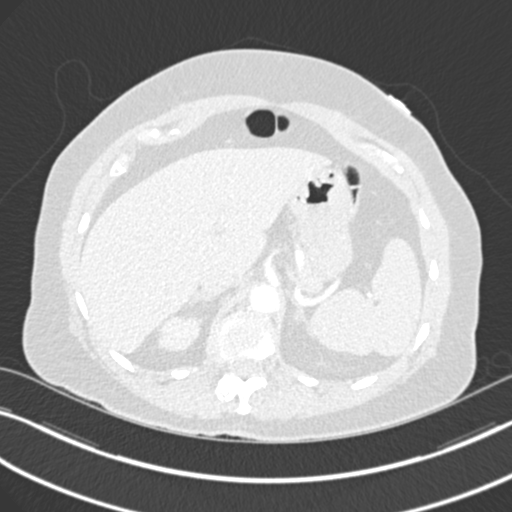
[im 34/259  soft-tissue]
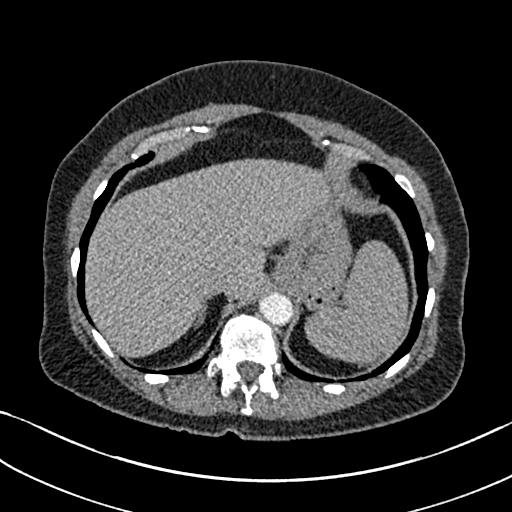
[im 45/259  lung]
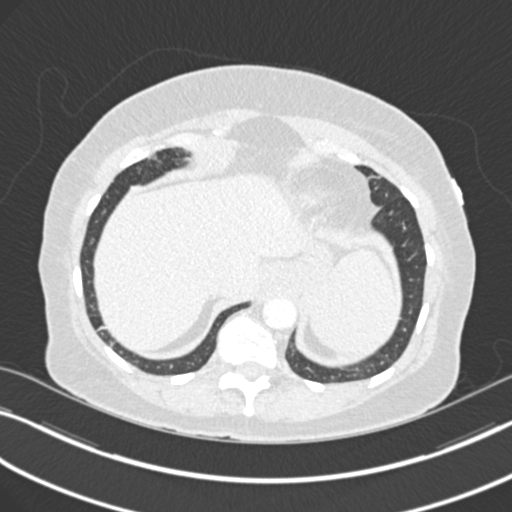
[im 57/259  soft-tissue]
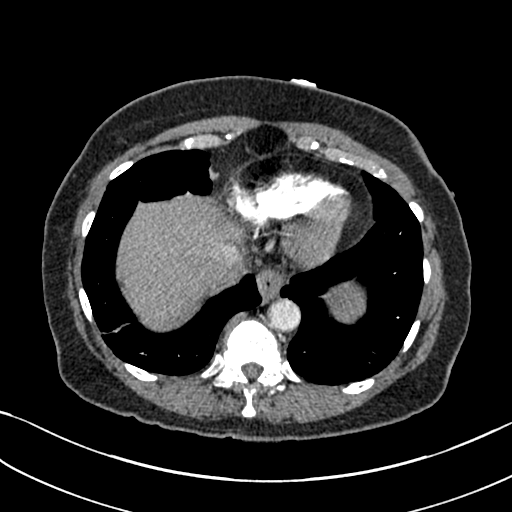
[im 79/259  lung]
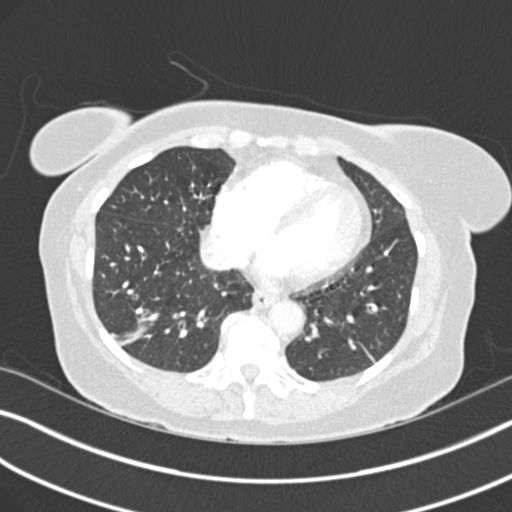
[im 90/259  soft-tissue]
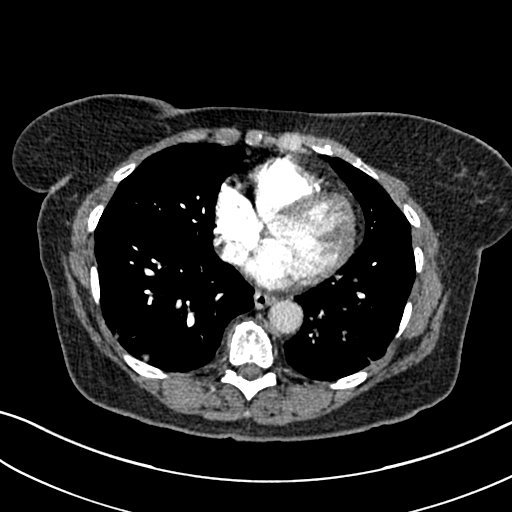
[im 101/259  lung]
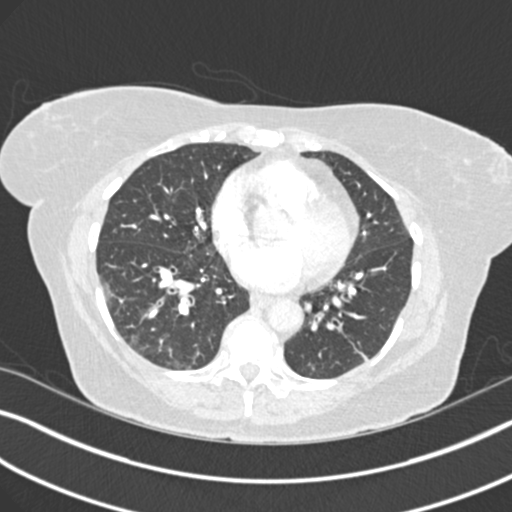
[im 124/259  soft-tissue]
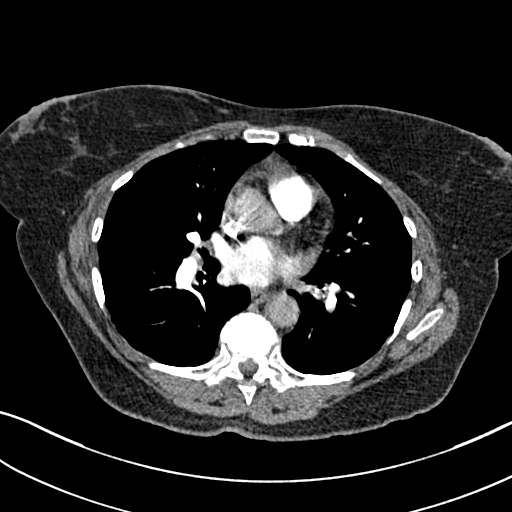
[im 135/259  lung]
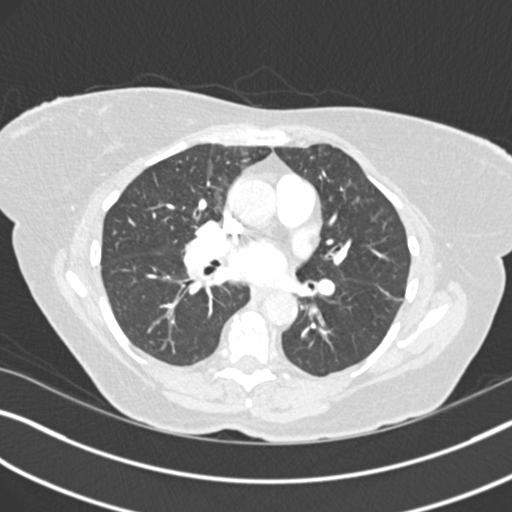
[im 158/259  soft-tissue]
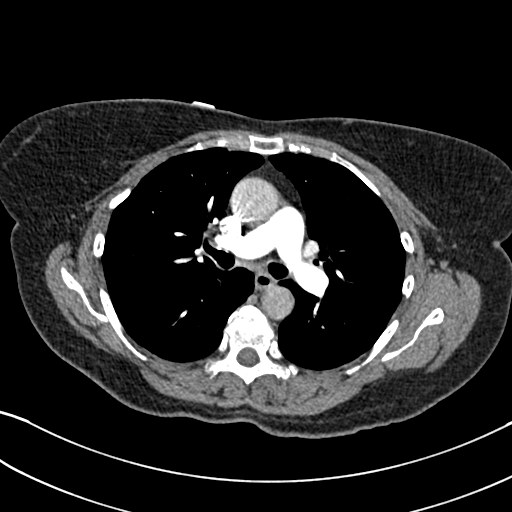
[im 169/259  lung]
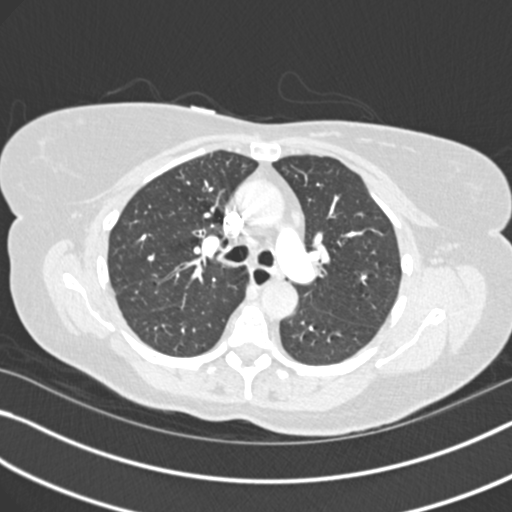
[im 180/259  soft-tissue]
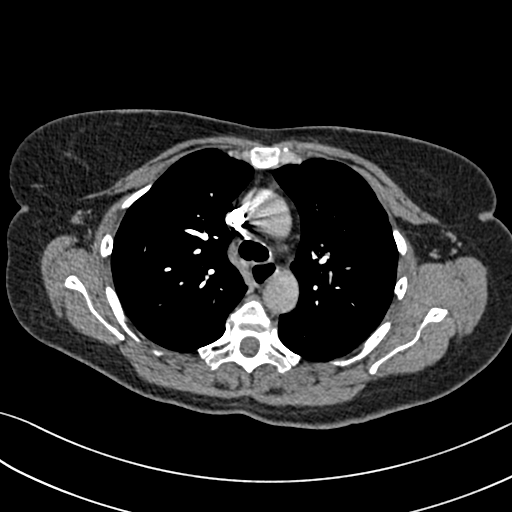
[im 202/259  lung]
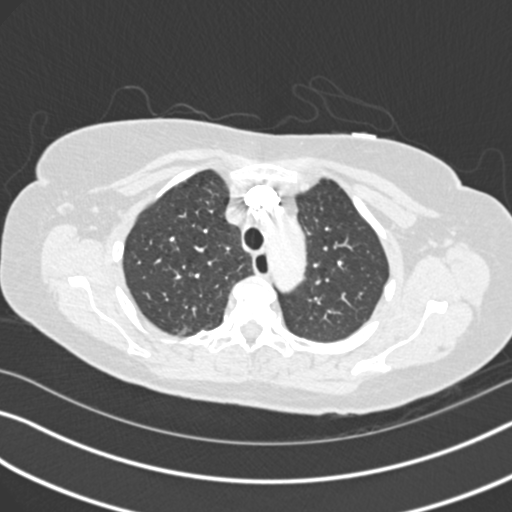
[im 214/259  soft-tissue]
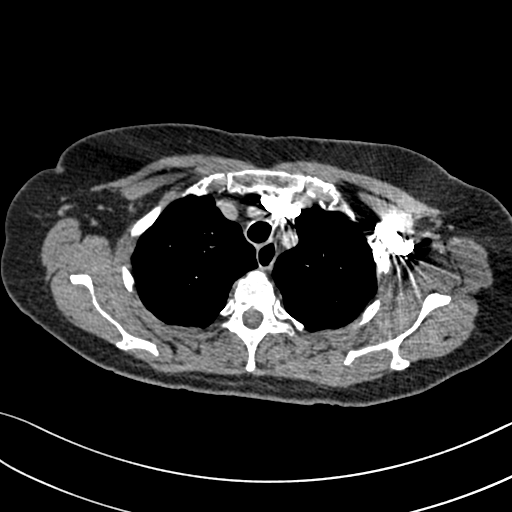
[im 225/259  lung]
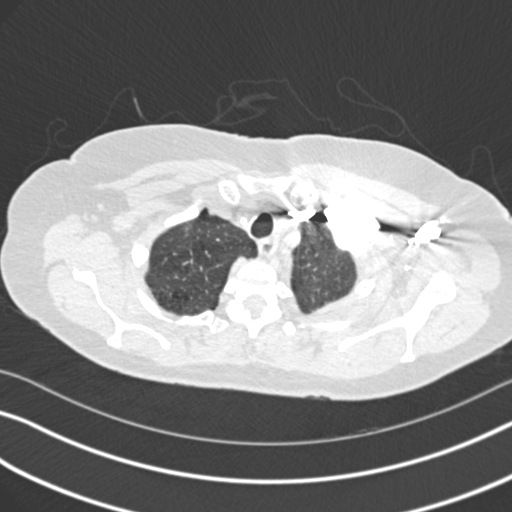
[im 247/259  soft-tissue]
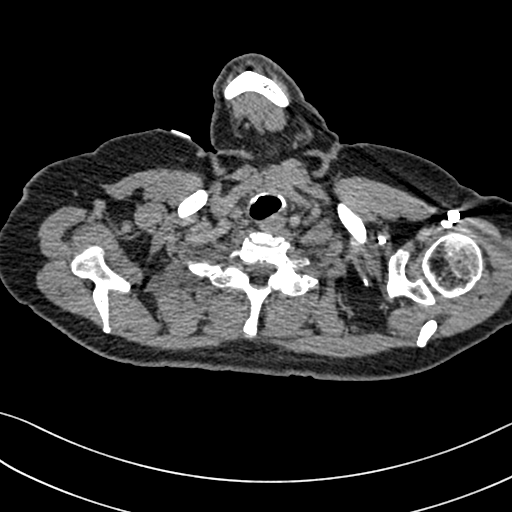

[Series 7: coronal mpr · coronal · 0.51mm/px · 2 of 75 slices shown]
[im 25/75  soft-tissue]
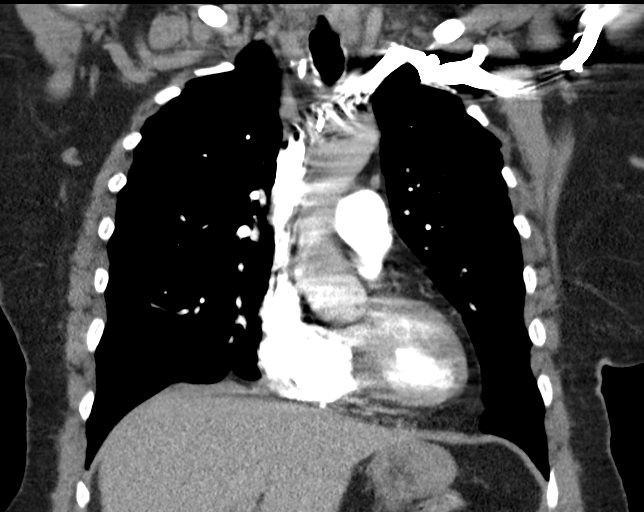
[im 50/75  soft-tissue]
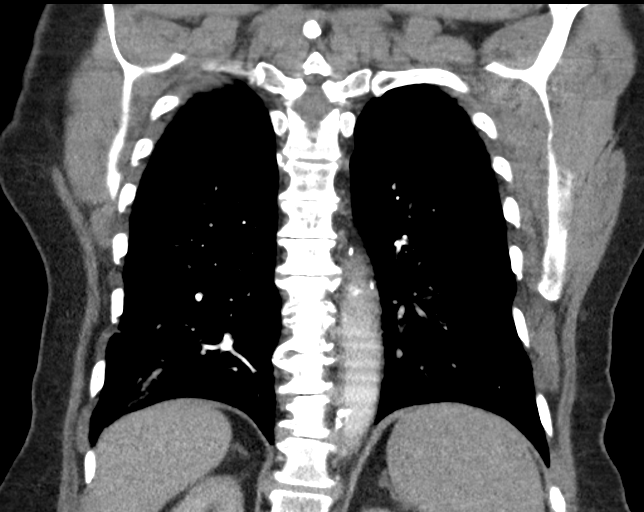

[18 of 46 positions shown; findings below may reference images not displayed]

FINDINGS: Cardiovascular: Thoracic aorta shows mild atherosclerotic change
without aneurysmal dilatation. No cardiac enlargement is seen. No
significant coronary calcifications are noted. The pulmonary artery
demonstrates a normal branching pattern. Decreased opacification of
the left lower lobe posteromedial branches is noted with relatively
normal appearing opacification in the remainder of the lungs. Some
motion artifact is noted and a vague filling defect is identified
consistent with pulmonary embolus. No other filling defects are
identified.

Mediastinum/Nodes: No significant hilar or mediastinal adenopathy is
noted. The thoracic inlet is within normal limits. The esophagus is
within normal limits with the exception of a small sliding-type
hiatal hernia.

Lungs/Pleura: The lungs are well aerated bilaterally with mild
bibasilar atelectasis. Mild emphysematous changes are noted. No
focal infiltrate or sizable effusion is seen. No sizable parenchymal
nodules are noted.

Upper Abdomen: No acute abnormality.

Musculoskeletal: Degenerative changes of the thoracic spine are
noted. No acute bony abnormality is seen.

Review of the MIP images confirms the above findings.
IMPRESSION: Left lower lobe pulmonary arterial embolus with decreased
opacification of the distal branches posterior medially.

No focal infiltrate is seen.  Mild bibasilar atelectasis is noted.

Aortic Atherosclerosis (5I7MF-HZT.T) and Emphysema (5I7MF-XPO.4).

## 2017-12-22 ENCOUNTER — Other Ambulatory Visit: Payer: Self-pay | Admitting: Family Medicine

## 2017-12-23 ENCOUNTER — Other Ambulatory Visit: Payer: Self-pay | Admitting: Family Medicine

## 2017-12-26 ENCOUNTER — Other Ambulatory Visit: Payer: Self-pay | Admitting: Family Medicine

## 2017-12-28 DIAGNOSIS — J441 Chronic obstructive pulmonary disease with (acute) exacerbation: Secondary | ICD-10-CM | POA: Diagnosis not present

## 2017-12-29 ENCOUNTER — Ambulatory Visit: Payer: PPO | Admitting: Family Medicine

## 2017-12-29 ENCOUNTER — Telehealth: Payer: Self-pay | Admitting: Family Medicine

## 2017-12-29 DIAGNOSIS — J302 Other seasonal allergic rhinitis: Secondary | ICD-10-CM

## 2017-12-29 MED ORDER — LEVOCETIRIZINE DIHYDROCHLORIDE 5 MG PO TABS: ORAL_TABLET | ORAL | 3 refills | Status: DC

## 2017-12-29 NOTE — Telephone Encounter (Signed)
She also want you to check her thyroid. Will you please give her call?

## 2017-12-29 NOTE — Telephone Encounter (Signed)
This patient would like her thyroid to be checked?

## 2017-12-29 NOTE — Telephone Encounter (Signed)
done

## 2017-12-29 NOTE — Telephone Encounter (Signed)
Will you please call in this prescription for this patient levocetirizine (XYZAL) 5 MG tablet. Thanks.

## 2018-01-18 ENCOUNTER — Ambulatory Visit: Payer: PPO | Admitting: Internal Medicine

## 2018-01-24 ENCOUNTER — Other Ambulatory Visit: Payer: Self-pay | Admitting: Family Medicine

## 2018-01-28 DIAGNOSIS — J441 Chronic obstructive pulmonary disease with (acute) exacerbation: Secondary | ICD-10-CM | POA: Diagnosis not present

## 2018-02-12 DIAGNOSIS — F411 Generalized anxiety disorder: Secondary | ICD-10-CM | POA: Diagnosis not present

## 2018-02-27 DIAGNOSIS — J441 Chronic obstructive pulmonary disease with (acute) exacerbation: Secondary | ICD-10-CM | POA: Diagnosis not present

## 2018-03-07 ENCOUNTER — Ambulatory Visit: Payer: PPO | Admitting: Family Medicine

## 2018-03-13 ENCOUNTER — Other Ambulatory Visit: Payer: Self-pay | Admitting: Family Medicine

## 2018-03-13 DIAGNOSIS — Z7901 Long term (current) use of anticoagulants: Secondary | ICD-10-CM

## 2018-03-13 DIAGNOSIS — Z86711 Personal history of pulmonary embolism: Secondary | ICD-10-CM

## 2018-03-25 ENCOUNTER — Other Ambulatory Visit: Payer: Self-pay | Admitting: Family Medicine

## 2018-03-30 DIAGNOSIS — J441 Chronic obstructive pulmonary disease with (acute) exacerbation: Secondary | ICD-10-CM | POA: Diagnosis not present

## 2018-04-03 ENCOUNTER — Other Ambulatory Visit: Payer: Self-pay | Admitting: Family Medicine

## 2018-04-03 ENCOUNTER — Other Ambulatory Visit: Payer: Self-pay | Admitting: Family

## 2018-04-03 NOTE — Telephone Encounter (Signed)
Copied from CRM [id number]. Topic: Quick Communication - Rx Refill/Question >> Apr 03, 2018 11:27 AM Angela Nevin wrote: Medication: MYRBETRIQ 50 MG TB24 tablet [[id number]] AND levothyroxine (SYNTHROID, LEVOTHROID) 88 MCG tablet [[id number]]   Pt would like a refill of these medications. Pt declined an appointment.  Has the patient contacted their pharmacy? Yes, pharmacy advised pt to call office.  Preferred Pharmacy (with phone number or street name): [facility] [phone] - [place], [place] - [address] [phone] (Phone) [phone] (Fax)

## 2018-04-03 NOTE — Telephone Encounter (Signed)
Myrbetriq refill Last Refill:07/06/17 # 90 tabs 1 RF Last OV: No OV with current provider that addresses this. PCP: Dr Carmelia Roller [facility]acy[facility] [address]  levothyroxine refill Last Refill:07/31/17 3 RF no quantity noted Last OV: No OV with current provider that address this   Last TSH 07/15/17  Appointment made with PCP 05/04/18 at 1045 for med refills. Pt would like TSH checked before the October appointment.

## 2018-04-04 MED ORDER — MIRABEGRON ER 50 MG PO TB24: 50.0000 mg | ORAL_TABLET | Freq: Every day | ORAL | 1 refills | Status: DC

## 2018-04-04 MED ORDER — LEVOTHYROXINE SODIUM 88 MCG PO TABS: ORAL_TABLET | ORAL | 0 refills | Status: DC

## 2018-04-10 ENCOUNTER — Other Ambulatory Visit: Payer: Self-pay | Admitting: Family Medicine

## 2018-04-13 ENCOUNTER — Other Ambulatory Visit: Payer: Self-pay | Admitting: Family Medicine

## 2018-04-16 DIAGNOSIS — F39 Unspecified mood [affective] disorder: Secondary | ICD-10-CM

## 2018-04-30 DIAGNOSIS — J441 Chronic obstructive pulmonary disease with (acute) exacerbation: Secondary | ICD-10-CM | POA: Diagnosis not present

## 2018-05-01 ENCOUNTER — Ambulatory Visit (INDEPENDENT_AMBULATORY_CARE_PROVIDER_SITE_OTHER): Payer: PPO

## 2018-05-01 DIAGNOSIS — R911 Solitary pulmonary nodule: Secondary | ICD-10-CM

## 2018-05-01 DIAGNOSIS — I700 Atherosclerosis of aorta: Secondary | ICD-10-CM | POA: Diagnosis not present

## 2018-05-01 DIAGNOSIS — R918 Other nonspecific abnormal finding of lung field: Secondary | ICD-10-CM | POA: Diagnosis not present

## 2018-05-04 ENCOUNTER — Other Ambulatory Visit: Payer: Self-pay | Admitting: Family Medicine

## 2018-05-04 ENCOUNTER — Encounter: Payer: Self-pay | Admitting: Family Medicine

## 2018-05-04 ENCOUNTER — Ambulatory Visit (INDEPENDENT_AMBULATORY_CARE_PROVIDER_SITE_OTHER): Payer: PPO | Admitting: Family Medicine

## 2018-05-04 VITALS — BP 130/80 | HR 73 | Temp 98.2°F | Ht 59.5 in | Wt 139.2 lb

## 2018-05-04 DIAGNOSIS — I10 Essential (primary) hypertension: Secondary | ICD-10-CM

## 2018-05-04 DIAGNOSIS — E890 Postprocedural hypothyroidism: Secondary | ICD-10-CM | POA: Diagnosis not present

## 2018-05-04 DIAGNOSIS — K635 Polyp of colon: Secondary | ICD-10-CM

## 2018-05-04 DIAGNOSIS — R635 Abnormal weight gain: Secondary | ICD-10-CM

## 2018-05-04 DIAGNOSIS — I2699 Other pulmonary embolism without acute cor pulmonale: Secondary | ICD-10-CM

## 2018-05-04 DIAGNOSIS — E785 Hyperlipidemia, unspecified: Secondary | ICD-10-CM

## 2018-05-04 DIAGNOSIS — M21619 Bunion of unspecified foot: Secondary | ICD-10-CM | POA: Insufficient documentation

## 2018-05-04 LAB — COMPREHENSIVE METABOLIC PANEL
ALT: 9 U/L (ref 0–35)
AST: 15 U/L (ref 0–37)
Albumin: 4.5 g/dL (ref 3.5–5.2)
Alkaline Phosphatase: 64 U/L (ref 39–117)
BUN: 12 mg/dL (ref 6–23)
CALCIUM: 10 mg/dL (ref 8.4–10.5)
CHLORIDE: 106 meq/L (ref 96–112)
CO2: 24 mEq/L (ref 19–32)
Creatinine, Ser: 1.01 mg/dL (ref 0.40–1.20)
GFR: 58.18 mL/min — ABNORMAL LOW (ref >60.00)
Glucose, Bld: 93 mg/dL (ref 70–99)
POTASSIUM: 4.6 meq/L (ref 3.5–5.1)
Sodium: 137 mEq/L (ref 135–145)
TOTAL PROTEIN: 6.9 g/dL (ref 6.0–8.3)
Total Bilirubin: 0.2 mg/dL (ref 0.2–1.2)

## 2018-05-04 LAB — LIPID PANEL
CHOLESTEROL: 192 mg/dL (ref 0–200)
HDL: 59.9 mg/dL (ref >39.00)
LDL Cholesterol: 103 mg/dL — ABNORMAL HIGH (ref 0–99)
NonHDL: 132.18
TRIGLYCERIDES: 146 mg/dL (ref 0.0–149.0)
Total CHOL/HDL Ratio: 3
VLDL: 29.2 mg/dL (ref 0.0–40.0)

## 2018-05-04 LAB — TSH: TSH: 42.34 u[IU]/mL — ABNORMAL HIGH (ref 0.35–4.50)

## 2018-05-04 LAB — T4, FREE: Free T4: 0.57 ng/dL — ABNORMAL LOW (ref 0.60–1.60)

## 2018-05-04 MED ORDER — LIRAGLUTIDE -WEIGHT MANAGEMENT 18 MG/3ML ~~LOC~~ SOPN: 0.6000 mg | PEN_INJECTOR | Freq: Every day | SUBCUTANEOUS | 2 refills | Status: DC

## 2018-05-04 NOTE — Patient Instructions (Addendum)
Give [place] 2-3 business days to get the results of your labs back.   Because your blood pressure is well-controlled, you no longer have to check your blood pressure at home anymore unless you wish. Some people check it twice daily every day and some people stop altogether. Either or anything in between is fine. Strong work!  Stop smoking.  If you do not hear anything about your referrals in the next 1-2 weeks, call our office and ask for an update.  Let us know if you need anything.

## 2018-05-04 NOTE — Progress Notes (Signed)
Chief Complaint  Patient presents with  . Medication Refill    Subjective: Patient is a 67 y.o. female here for med ck.  Hypothyroidism Patient presents for follow-up of hypothyroidism.  Reports compliance with medication. Current symptoms include: weight gain Denies: fatigue, feeling cold and cold intolerance, constipation, palpitations and weight loss She believes her dose should be increased  Hypertension Patient presents for hypertension follow up. She does not monitor home blood pressures.  She is compliant with medication- lisinopril 40 mg/d. Patient has these side effects of medication: none She is adhering to a healthy diet overall. Exercise: some walking  Hx of a bunion on L foot. It is painful and she would like to see a specialist for this.  Hx of allergies. She is well controlled on Xyzal. No AE's with med.  Hx of colon polyps. Requesting repeat colonoscopy, though per our records is not due until 2020. Will refer to GI for discussion and possible out of pocket costs associated with doing it early.  Has been having 6 lb wt gain since changing from Seroquel to Zyprexa. Requesting 1 mo of wt loss medication.  She continues to smoke. In precontemplative phase.   ROS: 10 pt ROS neg unless otherwise stated above  Past Medical History:  Diagnosis Date  . Anxiety   . Benzodiazepine dependence (HCC)   . Colonic polyp    mulitple polyps. repeat in 05/2019  . Constipation    IBS  . COPD (chronic obstructive pulmonary disease) (HCC)   . Depression   . GERD (gastroesophageal reflux disease)   . Hyperlipidemia   . Hypertension   . Insomnia   . Migraine headache   . OAB (overactive bladder)   . Osteoarthritis   . Osteoporosis 12/19/2017  . Post-surgical hypothyroidism   . Pulmonary embolism (HCC)    Eliquis to stop 05/03/19  . Scoliosis    Family History  Problem Relation Age of Onset  . Breast cancer Mother   . Arthritis Mother   . Hearing loss Mother   .  Hyperlipidemia Mother   . Hypertension Mother   . Lung cancer Maternal Uncle   . Lung cancer Maternal Grandmother   . Heart attack Maternal Grandmother   . Asthma Father   . COPD Father   . Hyperlipidemia Father   . Hypertension Father   . Anxiety disorder Sister   . Hypertension Sister   . Thyroid cancer Sister   . Anxiety disorder Brother   . Hypertension Brother   . Drug abuse Son   . Anxiety disorder Sister   . Hypertension Sister   . Anxiety disorder Sister   . Hypertension Sister   . Hyperlipidemia Son    Past Surgical History:  Procedure Laterality Date  . BREAST CYST ASPIRATION Left   . HEMORRHOID SURGERY    . THYROIDECTOMY     at [facility]  . TUBAL LIGATION  1979   No Known Allergies  Objective: BP 130/80 (BP Location: Left Arm, Patient Position: Sitting, Cuff Size: Normal)   Pulse 73   Temp 98.2 F (36.8 C) (Oral)   Ht 4' 11.5" (1.511 m)   Wt 139 lb 4 oz (63.2 kg)   SpO2 95%   BMI 27.65 kg/m  General: Awake, appears stated age HEENT: MMM, EOMi Heart: RRR, no murmurs Lungs: CTAB, no rales, wheezes or rhonchi. No accessory muscle use Psych: Age appropriate judgment and insight, normal affect and mood  Assessment and Plan: Post-surgical hypothyroidism - Plan: TSH, T4, free  Polyp of colon, unspecified part of colon, unspecified type - Plan: Ambulatory referral to Gastroenterology  Bunion - Plan: Ambulatory referral to Podiatry  Essential hypertension - Plan: Comprehensive metabolic panel  Hyperlipidemia, unspecified hyperlipidemia type - Plan: Lipid panel  Weight gain  Other pulmonary embolism without acute cor pulmonale, unspecified chronicity (HCC)  Check thyroid levels today.  Patient is convinced that she needs to increase her dosage of 80 mg Synthroid. I will refer to gastroenterology for further discussion of re-screening with colonoscopy.  I did notify her that she is not due until next year.  She is very anxious due to a family member dying  from colon cancer. Bunion looks mildly inflamed, not infected, will refer to podiatry for further evaluation at patient request. Blood pressure is well controlled today.  She is having a cough related to her lisinopril, however it is not severe enough to make her want to change.  I stated she can stop checking her blood pressures at home if she wishes. Continue statin.  We will check her cholesterol today. I called and Saxenda to use for her weight.  Do not use this until thyroid levels are back.  She does meet criteria for obesity given her BMI plus comorbid conditions related to obesity. Recently had CT scan that showed small clot remaining in the lung.  She is to stay on Eliquis for 1 more year before reassessing imaging. F/u in 6 mo pending above. The patient voiced understanding and agreement to the plan.  Jilda Roche [place], DO 05/04/18  1:30 PM

## 2018-05-04 NOTE — Progress Notes (Signed)
Pre visit review using our clinic review tool, if applicable. No additional management support is needed unless otherwise documented below in the visit note. 

## 2018-05-07 ENCOUNTER — Other Ambulatory Visit: Payer: Self-pay | Admitting: Family Medicine

## 2018-05-07 ENCOUNTER — Encounter: Payer: Self-pay | Admitting: Psychiatry

## 2018-05-07 ENCOUNTER — Telehealth: Payer: Self-pay | Admitting: Family Medicine

## 2018-05-07 ENCOUNTER — Ambulatory Visit (INDEPENDENT_AMBULATORY_CARE_PROVIDER_SITE_OTHER): Payer: PPO | Admitting: Psychiatry

## 2018-05-07 VITALS — BP 117/71 | HR 81 | Ht 59.0 in | Wt 141.0 lb

## 2018-05-07 DIAGNOSIS — F411 Generalized anxiety disorder: Secondary | ICD-10-CM

## 2018-05-07 DIAGNOSIS — F5101 Primary insomnia: Secondary | ICD-10-CM | POA: Diagnosis not present

## 2018-05-07 DIAGNOSIS — F39 Unspecified mood [affective] disorder: Secondary | ICD-10-CM

## 2018-05-07 MED ORDER — GABAPENTIN 300 MG PO CAPS: ORAL_CAPSULE | ORAL | 1 refills | Status: DC

## 2018-05-07 MED ORDER — DIAZEPAM 5 MG PO TABS: ORAL_TABLET | ORAL | 2 refills | Status: DC

## 2018-05-07 MED ORDER — LEVOTHYROXINE SODIUM 100 MCG PO TABS: 100.0000 ug | ORAL_TABLET | Freq: Every day | ORAL | 3 refills | Status: DC

## 2018-05-07 MED ORDER — MIRTAZAPINE 45 MG PO TABS: 45.0000 mg | ORAL_TABLET | Freq: Every day | ORAL | 1 refills | Status: DC

## 2018-05-07 NOTE — Telephone Encounter (Signed)
Copied from CRM [id number]. Topic: General - Other >> May 07, 2018  2:34 PM Gerrianne Scale wrote: Reason for CRM: pt calling stating that the [facility] 18 MG/3ML SOPN is over $1000 insurance want cover it

## 2018-05-07 NOTE — Patient Instructions (Signed)
Decrease Olanzapine to 5 mg po QHS x 5-7 days, then d/c.

## 2018-05-07 NOTE — Progress Notes (Signed)
Brenda Dunn [id number] September 30, 1950 67 y.o.  Subjective:   Patient ID:  Brenda Dunn is a 67 y.o. (DOB Dec 04, 1950) female.  Chief Complaint:  Chief Complaint  Patient presents with  . Weight Gain    HPI Brenda Dunn presents to the office today for follow-up of anxiety, depression, and insomnia. She reports that she has been more depressed recently. She reports sadness related to upcoming anniversary of son's death. Denies irritability. She reports 7-8lb weight gain and has been unable to lose weight despite being physically active. Reports that she took Gabapentin 1200 mg at HS one night when she missed her Gabapentin during the day and was able to sleep well. She reports that her sleep was more "sound" but she had repeated awakenings with Olanzapine "but I would rather not stay on it if it is going to make me gain weight." She reports high anxiety and recently felt that her heart was beating quickly. Reports feeling tired today due to decreased sleep. Reports nervous energy at times. "I feel like if I lay down and take a nap, I am missing something." Motivation is adequate- "I want to get up and do" and will occasionally put things off. "My concentration has always been off." She reports that she has difficulty sitting still and watching TV and her mind wanders. She reports losing attention after reading one paragraph. Denies SI.   Medications: I have reviewed the patient's current medications.  Allergies: No Known Allergies  Past Medical History:  Diagnosis Date  . Anxiety   . Benzodiazepine dependence (HCC)   . Colonic polyp    mulitple polyps. repeat in 05/2019  . Constipation    IBS  . COPD (chronic obstructive pulmonary disease) (HCC)   . Depression   . GERD (gastroesophageal reflux disease)   . Hyperlipidemia   . Hypertension   . Insomnia   . Migraine headache   . OAB (overactive bladder)   . Osteoarthritis   . Osteoporosis 12/19/2017  . Post-surgical hypothyroidism   .  Pulmonary embolism (HCC)    Eliquis to stop 05/03/19  . Scoliosis     Past Surgical History:  Procedure Laterality Date  . BREAST CYST ASPIRATION Left   . HEMORRHOID SURGERY    . THYROIDECTOMY     at [facility]  . TUBAL LIGATION  1979    Family History  Problem Relation Age of Onset  . Breast cancer Mother   . Arthritis Mother   . Hearing loss Mother   . Hyperlipidemia Mother   . Hypertension Mother   . Anxiety disorder Mother   . Lung cancer Maternal Uncle   . Anxiety disorder Maternal Uncle   . Lung cancer Maternal Grandmother   . Heart attack Maternal Grandmother   . Anxiety disorder Maternal Grandmother   . Asthma Father   . COPD Father   . Hyperlipidemia Father   . Hypertension Father   . Alcohol abuse Father   . Anxiety disorder Sister   . Hypertension Sister   . Thyroid cancer Sister   . Anxiety disorder Brother   . Hypertension Brother   . Drug abuse Son   . Anxiety disorder Sister   . Hypertension Sister   . Anxiety disorder Sister   . Hypertension Sister   . Hyperlipidemia Son     Social History   Socioeconomic History  . Marital status: Married    Spouse name: Not on file  . Number of children: Not on file  . Years  of education: Not on file  . Highest education level: Not on file  Occupational History  . Occupation: disabled due "my nerves"  Social Needs  . Financial resource strain: Not on file  . Food insecurity:    Worry: Not on file    Inability: Not on file  . Transportation needs:    Medical: Not on file    Non-medical: Not on file  Tobacco Use  . Smoking status: Current Every Day Smoker    Packs/day: 0.50    Years: 44.00    Pack years: 22.00    Types: Cigarettes  . Smokeless tobacco: Never Used  Substance and Sexual Activity  . Alcohol use: No  . Drug use: No  . Sexual activity: Not on file  Lifestyle  . Physical activity:    Days per week: Not on file    Minutes per session: Not on file  . Stress: Not on file  Relationships   . Social connections:    Talks on phone: Not on file    Gets together: Not on file    Attends religious service: Not on file    Active member of club or organization: Not on file    Attends meetings of clubs or organizations: Not on file    Relationship status: Not on file  . Intimate partner violence:    Fear of current or ex partner: Not on file    Emotionally abused: Not on file    Physically abused: Not on file    Forced sexual activity: Not on file  Other Topics Concern  . Not on file  Social History Narrative  . Not on file    Past Medical History, Surgical history, Social history, and Family history were reviewed and updated as appropriate.   Please see review of systems for further details on the patient's review from today.   Review of Systems:  Review of Systems  Constitutional: Positive for unexpected weight change.  Musculoskeletal: Negative for gait problem.  Neurological: Negative for tremors.  Psychiatric/Behavioral: Positive for decreased concentration, dysphoric mood and sleep disturbance. The patient is nervous/anxious.     Objective:   Physical Exam:  BP 117/71   Pulse 81   Ht 4\' 11"  (1.499 m)   Wt 141 lb (64 kg)   BMI 28.48 kg/m   Physical Exam  Constitutional: She is oriented to person, place, and time. She appears well-developed. No distress.  Musculoskeletal: Normal range of motion.  Neurological: She is alert and oriented to person, place, and time. Coordination normal.  Psychiatric: Her speech is normal. Judgment and thought content normal. Her mood appears anxious. Her affect is not angry, not blunt, not labile and not inappropriate. Cognition and memory are normal. She exhibits a depressed mood. She expresses no homicidal and no suicidal ideation. She expresses no suicidal plans and no homicidal plans.  Restless    Lab Review:     Component Value Date/Time   NA 137 05/04/2018 1129   NA 140 10/02/2017 1700   K 4.6 05/04/2018 1129   CL  106 05/04/2018 1129   CO2 24 05/04/2018 1129   GLUCOSE 93 05/04/2018 1129   BUN 12 05/04/2018 1129   BUN 11 10/02/2017 1700   CREATININE 1.01 05/04/2018 1129   CREATININE 0.98 05/18/2017 1426   CALCIUM 10.0 05/04/2018 1129   PROT 6.9 05/04/2018 1129   ALBUMIN 4.5 05/04/2018 1129   AST 15 05/04/2018 1129   ALT 9 05/04/2018 1129   ALKPHOS 64 05/04/2018  1129   BILITOT 0.2 05/04/2018 1129   GFRNONAA 79 10/02/2017 1700   GFRNONAA 88 05/10/2017 1023   GFRAA 91 10/02/2017 1700   GFRAA 102 05/10/2017 1023       Component Value Date/Time   WBC 7.3 08/28/2017 1142   WBC 11.1 (H) 08/07/2017 1009   RBC 3.77 08/28/2017 1142   RBC 3.68 (L) 08/07/2017 1009   HGB 11.6 08/28/2017 1142   HCT 33.2 (L) 08/28/2017 1142   PLT 222 08/28/2017 1142   MCV 88 08/28/2017 1142   MCH 30.8 08/28/2017 1142   MCH 31.0 08/07/2017 1009   MCHC 34.9 08/28/2017 1142   MCHC 32.2 08/07/2017 1009   RDW 13.4 08/28/2017 1142   LYMPHSABS 1.7 08/28/2017 1142   MONOABS 0.7 08/03/2017 1428   EOSABS 0.1 08/28/2017 1142   BASOSABS 0.0 08/28/2017 1142     Assessment: Plan:   Pt seen for 30 minutes and greater than 50% of session spent counseling pt re: metabolic side effects of Olanzapine and that this was the most likely cause of recent weight gain. Discussed decreasing Olanzapine to 5 mg at bedtime for 5 to 7 days before discontinuing.  Discussed that it appears PCP has submitted order for Saxenda and recommend that she start this since it will also help with undesired weight gain.  Discussed increasing gabapentin since she reports that taking 1200 mg at bedtime was effective for insomnia in the past.  Discussed continuing gabapentin 300 mg twice daily during the daytime and that this dose could also be increased in the future if necessary for adequate control of anxiety signs and symptoms. Generalized anxiety disorder  Episodic mood disorder (HCC)  Primary insomnia  Please see After Visit Summary for patient  specific instructions.  Future Appointments  Date Time Provider Department Center  06/15/2018 11:00 AM Sharlene Dory, [place] [facility]  07/19/2018 10:45 AM Sharlene Dory, [place] [facility]  11/09/2018 10:45 AM Carmelia Roller, Jilda Roche, DO [facility]    No orders of the defined types were placed in this encounter.     -------------------------------

## 2018-05-08 ENCOUNTER — Encounter: Payer: Self-pay | Admitting: Psychiatry

## 2018-05-08 NOTE — Telephone Encounter (Signed)
Have her call her insurance for a list of options for obesity medications. TY.

## 2018-05-09 ENCOUNTER — Other Ambulatory Visit: Payer: Self-pay | Admitting: Family Medicine

## 2018-05-09 NOTE — Telephone Encounter (Signed)
OK, if the increased dose of synthroid doesn't work out, we will try this medicine. I don't think we should go down that road just yet since the thyroid wasn't right. TY.

## 2018-05-09 NOTE — Telephone Encounter (Signed)
Called the patient informed the patient of PCP instructions.

## 2018-05-09 NOTE — Telephone Encounter (Signed)
Patient informed of instructions. She verbalized understanding. 

## 2018-05-09 NOTE — Telephone Encounter (Signed)
Pt called and states that [facility] will be the best option that insurance will work w/; pt [employer] states that pcp can go online to fill out a form and submit for the request for medicare Rx duration drug; contact pt if needed

## 2018-05-11 ENCOUNTER — Other Ambulatory Visit: Payer: Self-pay | Admitting: Family Medicine

## 2018-05-28 ENCOUNTER — Ambulatory Visit: Payer: PPO | Admitting: Podiatry

## 2018-05-28 ENCOUNTER — Ambulatory Visit (INDEPENDENT_AMBULATORY_CARE_PROVIDER_SITE_OTHER): Payer: PPO

## 2018-05-28 ENCOUNTER — Encounter: Payer: Self-pay | Admitting: Podiatry

## 2018-05-28 DIAGNOSIS — M205X2 Other deformities of toe(s) (acquired), left foot: Secondary | ICD-10-CM

## 2018-05-28 DIAGNOSIS — M2012 Hallux valgus (acquired), left foot: Secondary | ICD-10-CM

## 2018-05-28 NOTE — Progress Notes (Signed)
Subjective:    Patient ID: Brenda Dunn, female    DOB: [birth year], 67 y.o.   MRN: [id number]  HPI  67 year old female presents the office today for concerns of a painful bunion on the left foot which is been ongoing for some time.  She states that she is identified bigger shoes for the left side and despite doing this she continues to have pain to the bunion site.  She is tried offloading as well to any significant improvement.  She also states that she has osteoporosis.  She did not do something to have the bunion removed.  She has no other concerns today.  Review of Systems  All other systems reviewed and are negative.  Past Medical History:  Diagnosis Date  . Anxiety   . Benzodiazepine dependence (HCC)   . Colonic polyp    mulitple polyps. repeat in 05/2019  . Constipation    IBS  . COPD (chronic obstructive pulmonary disease) (HCC)   . Depression   . GERD (gastroesophageal reflux disease)   . Hyperlipidemia   . Hypertension   . Insomnia   . Migraine headache   . OAB (overactive bladder)   . Osteoarthritis   . Osteoporosis 12/19/2017  . Post-surgical hypothyroidism   . Pulmonary embolism (HCC)    Eliquis to stop 05/03/19  . Scoliosis     Past Surgical History:  Procedure Laterality Date  . BREAST CYST ASPIRATION Left   . HEMORRHOID SURGERY    . THYROIDECTOMY     at [facility]  . TUBAL LIGATION  1979     Current Outpatient Medications:  .  alendronate (FOSAMAX) 70 MG tablet, Take 1 tablet (70 mg total) by mouth every 7 (seven) days. Take with a full glass of water on an empty stomach., Disp: 4 tablet, Rfl: 11 .  atorvastatin (LIPITOR) 40 MG tablet, TAKE 1 TABLET BY MOUTH EVERYDAY AT BEDTIME, Disp: 90 tablet, Rfl: 0 .  Calcium Carbonate-Vitamin D (CALTRATE 600+D) 600-400 MG-UNIT tablet, Take 1 tablet by mouth 2 (two) times daily., Disp: 60 tablet, Rfl: 11 .  [employer] CALCIUM 600+D 600-800 MG-UNIT TABS, Take 1 tablet by mouth 2 (two) times daily., Disp: , Rfl: 11 .  [START ON  06/16/2018] diazepam (VALIUM) 5 MG tablet, Take 1 tablet in the am, 1/2 tablet at lunch and 1 tablet at night, Disp: 75 tablet, Rfl: 2 .  Docusate Calcium (STOOL SOFTENER PO), Take 3 tablets by mouth daily., Disp: , Rfl:  .  ELIQUIS 5 MG TABS tablet, TAKE 1 TABLET TWICE A DAY, Disp: 60 tablet, Rfl: 2 .  gabapentin (NEURONTIN) 300 MG capsule, Take 1 po BID and four po QHS, Disp: 540 capsule, Rfl: 1 .  levocetirizine (XYZAL) 5 MG tablet, TAKE 1 TABLET BY MOUTH EVERY DAY IN THE EVENING (Patient not taking: Reported on 05/07/2018), Disp: 30 tablet, Rfl: 3 .  levothyroxine (SYNTHROID, LEVOTHROID) 100 MCG tablet, Take 1 tablet (100 mcg total) by mouth daily before breakfast. (Patient not taking: Reported on 05/07/2018), Disp: 30 tablet, Rfl: 3 .  levothyroxine (SYNTHROID, LEVOTHROID) 88 MCG tablet, TAKE 1 TABLET EVERY DAY ON EMPTY STOMACH, Disp: , Rfl:  .  LINZESS 290 MCG CAPS capsule, TAKE 1 CAPSULE BY MOUTH DAILY ON EMPTY STOMACH AT LEAST 30 MINUTES PRIOR TO 1ST MEAL OF THE DAY, Disp: 90 capsule, Rfl: 1 .  lisinopril (PRINIVIL,ZESTRIL) 40 MG tablet, TAKE 1 TABLET BY MOUTH EVERY DAY, Disp: 90 tablet, Rfl: 1 .  lisinopril (PRINIVIL,ZESTRIL) 40 MG tablet, TAKE  1 TABLET BY MOUTH DAILY, Disp: , Rfl:  .  mirabegron ER (MYRBETRIQ) 50 MG TB24 tablet, Take 1 tablet (50 mg total) by mouth daily., Disp: 90 tablet, Rfl: 1 .  mirtazapine (REMERON) 45 MG tablet, Take 1 tablet (45 mg total) by mouth at bedtime., Disp: 90 tablet, Rfl: 1 .  montelukast (SINGULAIR) 10 MG tablet, TAKE 1 TABLET BY MOUTH EVERYDAY AT BEDTIME, Disp: 90 tablet, Rfl: 1 .  OLANZapine (ZYPREXA) 5 MG tablet, Take 10 mg by mouth at bedtime., Disp: , Rfl:  .  omeprazole (PRILOSEC) 40 MG capsule, Take 1 capsule (40 mg total) by mouth daily. (Patient not taking: Reported on 05/07/2018), Disp: 30 capsule, Rfl: 2 .  PROAIR HFA 108 (90 Base) MCG/ACT inhaler, INHALE 2 PUFFS EVERY 4 HOURS AS NEEDED FOR WHEEZING OR SHORTNESS OF BREATH, Disp: 8.5 Inhaler, Rfl:  0 .  Probiotic Product (PROBIOTIC DAILY PO), Take 2 tablets by mouth daily., Disp: , Rfl:  .  SYMBICORT 80-4.5 MCG/ACT inhaler, TAKE 2 PUFFS BY MOUTH TWICE A DAY, Disp: 30.6 Inhaler, Rfl: 1  No Known Allergies       Objective:   Physical Exam  General: AAO x3, NAD  Dermatological: Mild erythema on the bunion prominence of the left foot but this is more from inflammation as there is no signs of infection otherwise.  No open sores.  Vascular: Dorsalis Pedis artery and Posterior Tibial artery pedal pulses are 2/4 bilateral with immedate capillary fill time. There is no pain with calf compression, swelling, warmth, erythema.   Neruologic: Grossly intact via light touch bilateral. Protective threshold with Semmes Wienstein monofilament intact to all pedal sites bilateral.   Musculoskeletal: Significant bunion deformities present in the left side there is tenderness palpation of the bunion site.  There is decreased range of motion particularly dorsiflexion.  There is tenderness with him up with along the first MPJ as well.  Muscular strength 5/5 in all groups tested bilateral.  Gait: Unassisted, Nonantalgic.      Assessment & Plan:  67 year old female bunion deformity, first MPJ arthritis -Treatment options discussed including all alternatives, risks, and complications -Etiology of symptoms were discussed -X-rays were obtained and reviewed with the patient.  No evidence of acute fracture.  Significant bunion deformities present and there is arthritic changes of the first MPJ. -We discussed with conservative as well as surgical treatment options.  At this time we discussed with her primary care physician regards to clearance for surgery and also discussed the case with the group next week.  I am leaning more towards doing a calorie bunionectomy given her osteoporosis as well as a deformity and arthritic changes but we also discussed other options including biplanar osteotomy as well as Lapidus  bunionectomy.  Vivi Barrack DPM

## 2018-05-30 DIAGNOSIS — J441 Chronic obstructive pulmonary disease with (acute) exacerbation: Secondary | ICD-10-CM | POA: Diagnosis not present

## 2018-06-02 ENCOUNTER — Other Ambulatory Visit: Payer: Self-pay | Admitting: Family Medicine

## 2018-06-02 DIAGNOSIS — Z86711 Personal history of pulmonary embolism: Secondary | ICD-10-CM

## 2018-06-02 DIAGNOSIS — Z7901 Long term (current) use of anticoagulants: Secondary | ICD-10-CM

## 2018-06-06 ENCOUNTER — Other Ambulatory Visit: Payer: Self-pay | Admitting: Family Medicine

## 2018-06-06 ENCOUNTER — Telehealth: Payer: Self-pay | Admitting: Podiatry

## 2018-06-06 NOTE — Telephone Encounter (Signed)
Pt was waiting on Medical clearance for bunion surgery due to being on blood thinners. She wanted to know has Dr. Ardelle Anton spoke to her pcp. Please give her a call

## 2018-06-07 NOTE — Telephone Encounter (Signed)
Dr. Ardelle Anton, I don't have any information on this patient.  Please advise.

## 2018-06-08 NOTE — Telephone Encounter (Signed)
I had sent my note to the PCP but never heard back. Can you send a medical clearance note for this patient. She will likely undergo a Development worker, community. Also let her know that I discussed surgery with the other doctors and believe that the keller bunionecotmy is going to be best for her. We can discuss in further once we get clearance. Thanks.

## 2018-06-14 ENCOUNTER — Telehealth: Payer: Self-pay | Admitting: Podiatry

## 2018-06-14 NOTE — Telephone Encounter (Signed)
Patient seen 10/28 and discussed having bunion shaved. Dr. Ardelle AntonWagoner needed to check with her primary doctor to see if pt could stop taking blood thinner in order to have it shaved. Patient was supposed to receive a call back the first of November and hasn't heard anything. Please give patient a call.

## 2018-06-15 ENCOUNTER — Ambulatory Visit: Payer: PPO | Admitting: Family Medicine

## 2018-06-18 ENCOUNTER — Telehealth: Payer: Self-pay | Admitting: *Deleted

## 2018-06-18 ENCOUNTER — Encounter: Payer: Self-pay | Admitting: Family Medicine

## 2018-06-18 ENCOUNTER — Ambulatory Visit (INDEPENDENT_AMBULATORY_CARE_PROVIDER_SITE_OTHER): Payer: PPO | Admitting: Family Medicine

## 2018-06-18 VITALS — BP 108/76 | HR 82 | Temp 98.4°F | Ht 59.5 in | Wt 132.2 lb

## 2018-06-18 DIAGNOSIS — M21619 Bunion of unspecified foot: Secondary | ICD-10-CM | POA: Diagnosis not present

## 2018-06-18 DIAGNOSIS — E890 Postprocedural hypothyroidism: Secondary | ICD-10-CM

## 2018-06-18 DIAGNOSIS — M810 Age-related osteoporosis without current pathological fracture: Secondary | ICD-10-CM

## 2018-06-18 DIAGNOSIS — R0989 Other specified symptoms and signs involving the circulatory and respiratory systems: Secondary | ICD-10-CM

## 2018-06-18 DIAGNOSIS — R6889 Other general symptoms and signs: Secondary | ICD-10-CM | POA: Insufficient documentation

## 2018-06-18 LAB — T4, FREE: FREE T4: 1.29 ng/dL (ref 0.60–1.60)

## 2018-06-18 LAB — TSH: TSH: 0.63 u[IU]/mL (ref 0.35–4.50)

## 2018-06-18 MED ORDER — OMEPRAZOLE 40 MG PO CPDR: 40.0000 mg | DELAYED_RELEASE_CAPSULE | Freq: Every day | ORAL | 3 refills | Status: DC

## 2018-06-18 MED ORDER — LEVOTHYROXINE SODIUM 88 MCG PO TABS: 88.0000 ug | ORAL_TABLET | Freq: Every day | ORAL | 3 refills | Status: DC

## 2018-06-18 NOTE — Patient Instructions (Addendum)
Take 3 tabs of the "petite" vit D/calcium supplement (Citracal) twice daily.   Give us 2-3 business days to get the results of your labs back.   Keep the diet clean and stay active.   Call Dr. Gabriel RungWagoner's office. They need to send me a medical clearance note to approve your procedure.   Let us know if you need anything.

## 2018-06-18 NOTE — Progress Notes (Signed)
Chief Complaint  Patient presents with  . Follow-up    thyroid    Subjective: Patient is a 67 y.o. female here for thyroid f/u.  Increased levothryoxine to 100 mcg, has lost 7 lbs. Feels exactly the same otherwise and would like to go back on the 88 mcg dose. She has been working hard to lose wt otherwise.  On Ca and Vit D for osteoporosis. Wants to know how much she should take.    Hx of bunion. Follows with podiatry and they have a planned procedure. Needs clearance for surg due to being on Eliquis.  Stil clearing her throat a lot. On Xyzal but not helpful. Her other also had this issue. She is on PPI. No interested in pursuing further at this time.    ROS: Heart: Denies palpitations Endo: +wt loss  Past Medical History:  Diagnosis Date  . Anxiety   . Benzodiazepine dependence (HCC)   . Colonic polyp    mulitple polyps. repeat in 05/2019  . Constipation    IBS  . COPD (chronic obstructive pulmonary disease) (HCC)   . Depression   . GERD (gastroesophageal reflux disease)   . Hyperlipidemia   . Hypertension   . Insomnia   . Migraine headache   . OAB (overactive bladder)   . Osteoarthritis   . Osteoporosis 12/19/2017  . Post-surgical hypothyroidism   . Pulmonary embolism (HCC)    Eliquis to stop 05/03/19  . Scoliosis     Objective: BP 108/76 (BP Location: Left Arm, Patient Position: Sitting, Cuff Size: Normal)   Pulse 82   Temp 98.4 F (36.9 C) (Oral)   Ht 4' 11.5" (1.511 m)   Wt 132 lb 4 oz (60 kg)   SpO2 98%   BMI 26.26 kg/m  General: Awake, appears stated age HEENT: MMM, EOMi Heart: RRR, no LE edema Lungs: CTAB, no rales, wheezes or rhonchi. No accessory muscle use Psych: Age appropriate judgment and insight, normal affect and mood  Assessment and Plan: Post-surgical hypothyroidism - Plan: levothyroxine (SYNTHROID, LEVOTHROID) 88 MCG tablet, TSH, T4, free  Osteoporosis without current pathological fracture, unspecified osteoporosis  type  Bunion  Chronic throat clearing  Ck above, go back to 88 mcg dosing. Correct Vit D and Ca discussed and written down. Podiatry needs to send us medical clearance letter so we can hold Eliquis for 1 d and she can have procedure. Cont PO antihist, offered ENT referral but she declined. F/u in April as originally scheduled for prn.  The patient voiced understanding and agreement to the plan.  Jilda Rocheicholas Paul Snow HillWendling, DO 06/18/18  10:56 AM

## 2018-06-18 NOTE — Progress Notes (Signed)
Pre visit review using our clinic review tool, if applicable. No additional management support is needed unless otherwise documented below in the visit note. 

## 2018-06-18 NOTE — Telephone Encounter (Signed)
"  Dr. Ardelle AntonWagoner needs to send a medical clearance letter to my primary care doctor, Dr. Carmelia RollerWendling, so he can shave the bunion off my foot."

## 2018-06-19 NOTE — Telephone Encounter (Signed)
Relation to pt: self  Call back number: [phone]   Reason for call:  Patient checking on the status if medical clearance form was received by Dr. Carmelia RollerWendling, please not when fax is received, patient would like to be made aware. Dr. Carmelia RollerWendling fax # [phone] (main fax) and [phone] (nurse fax)

## 2018-06-20 NOTE — Telephone Encounter (Signed)
I just checked and have not received anything yet. TY.

## 2018-06-22 ENCOUNTER — Telehealth: Payer: Self-pay | Admitting: Family Medicine

## 2018-06-22 ENCOUNTER — Encounter: Payer: Self-pay | Admitting: *Deleted

## 2018-06-22 NOTE — Telephone Encounter (Signed)
I faxed and sent a letter via Epic in-basket requesting medical clearance for surgery.  I called and informed Ms. Leavy CellaBoyd that the letter was sent previously by Dr. Ardelle AntonWagoner.  I informed her that I sent a request today via fax and Epic in-basket.  Once we hear something from Dr. Carmelia RollerWendling, we can get her scheduled.  We just need a statement in a clinical note or a letter sent to us stating you are medically cleared to have the surgery.  We do not have a form that needs to be completed.  The note needs to let us know if there are any contraindications or if you need to stop any medications prior to surgery.  She said she was going to call Dr. Hollie BeachWendling's office to follow-up today.  I informed her that I had just sent the letter.  She said she would call this afternoon.  She was asking questions about the surgical center.  I told her we would give her all that information when she came for her consult appointment with Dr. Ardelle AntonWagoner.

## 2018-06-22 NOTE — Telephone Encounter (Signed)
I have received a letter from Dr. Gabriel RungWagoner's office. Please schedule her for a pre-op visit and we will make official recs for surgery. TY.

## 2018-06-22 NOTE — Telephone Encounter (Signed)
Can you please send a medical clearance letter? Thanks.

## 2018-06-22 NOTE — Progress Notes (Signed)
Per Dr. Ardelle AntonWagoner, I sent a medical clearance request letter to Dr. Carmelia RollerWendling.

## 2018-06-22 NOTE — Telephone Encounter (Signed)
Copied from CRM [id number]. Topic: General - Other >> Jun 22, 2018 10:52 AM Jaquita Rectoravis, Karen A wrote: Reason for CRM: Patient called to inform Dr Carmelia RollerWendling that the [facility] will be sending a fax over to be completed and signed. She request a call back when all this has been done and form is returned to [facility]. Ph# [phone]  Called and scheduled appt with PCP 07/02/2018

## 2018-06-25 ENCOUNTER — Telehealth: Payer: Self-pay | Admitting: *Deleted

## 2018-06-25 NOTE — Telephone Encounter (Signed)
Received Medical/Surgical Clearance Form from [facility]; forwarded to provider/SLS 11/25

## 2018-06-30 DIAGNOSIS — J441 Chronic obstructive pulmonary disease with (acute) exacerbation: Secondary | ICD-10-CM | POA: Diagnosis not present

## 2018-07-02 ENCOUNTER — Encounter: Payer: Self-pay | Admitting: Family Medicine

## 2018-07-02 ENCOUNTER — Ambulatory Visit (INDEPENDENT_AMBULATORY_CARE_PROVIDER_SITE_OTHER): Payer: PPO | Admitting: Family Medicine

## 2018-07-02 ENCOUNTER — Other Ambulatory Visit: Payer: Self-pay | Admitting: Family Medicine

## 2018-07-02 VITALS — BP 110/70 | HR 82 | Temp 98.8°F | Ht 60.0 in | Wt 133.4 lb

## 2018-07-02 DIAGNOSIS — Z01818 Encounter for other preprocedural examination: Secondary | ICD-10-CM | POA: Diagnosis not present

## 2018-07-02 NOTE — Progress Notes (Signed)
Subjective:   Chief Complaint  Patient presents with  . Pre-op Exam    Brenda Dunn  is here for a Pre-operative physical at the request of Dr. Ardelle AntonWagoner.   She is having double osteotomy or a Lapidus procedure including a bunionectomy surgery on TBD for a bunion.  Personal or family hx of adverse outcome to anesthesia? No  Chipped, cracked, missing, or loose teeth? Yes Decreased ROM of neck? No  Able to walk up 2 flights of stairs without becoming significantly short of breath or having chest pain? Yes   Revised Goldman Criteria: High Risk Surgery (intraperitoneal, intrathoracic, aortic): No  Ischemic heart disease (Prior MI, +excercise stress test, angina, nitrate use, Qwave): No  History of heart failure: No  History of cerebrovascular disease: No  History of diabetes: No  Insulin therapy for DM: No  Preoperative Cr >2.0: No   Revised Goldman Criteria - risk for major cardiac death No risk factors - 0.4 percent One risk factor - 1.0 percent  Two risk factors - 2.4 percent  Three or more risk factors - 5.4 percent   Patient Active Problem List   Diagnosis Date Noted  . Chronic throat clearing 06/18/2018  . Bunion 05/04/2018  . Severe mood disorder without psychotic features (HCC) 04/16/2018  . Osteoporosis 12/19/2017  . Seasonal allergies 11/24/2017  . Solitary pulmonary nodule on lung CT 06/02/2017  . Tobacco abuse 05/02/2017  . Essential hypertension   . GERD (gastroesophageal reflux disease)   . Post-surgical hypothyroidism   . Hyperlipidemia   . Osteoarthritis   . Depression   . Constipation   . Scoliosis   . Migraine headache   . Insomnia   . OAB (overactive bladder)   . Colonic polyp   . History of colonic polyps 02/06/2017  . COPD (chronic obstructive pulmonary disease) with chronic bronchitis (HCC) 12/07/2016  . GAD (generalized anxiety disorder) 12/07/2016  . RLS (restless legs syndrome) 12/07/2016   Past Medical History:  Diagnosis Date  . Anxiety   .  Benzodiazepine dependence (HCC)   . Colonic polyp    mulitple polyps. repeat in 05/2019  . Constipation    IBS  . COPD (chronic obstructive pulmonary disease) (HCC)   . Depression   . GERD (gastroesophageal reflux disease)   . Hyperlipidemia   . Hypertension   . Insomnia   . Migraine headache   . OAB (overactive bladder)   . Osteoarthritis   . Osteoporosis 12/19/2017  . Post-surgical hypothyroidism   . Pulmonary embolism (HCC)    Eliquis to stop 05/03/19  . Scoliosis     Past Surgical History:  Procedure Laterality Date  . BREAST CYST ASPIRATION Left   . HEMORRHOID SURGERY    [facility]  . TUBAL LIGATION  1979    Current Outpatient Medications  Medication Sig Dispense Refill  . alendronate (FOSAMAX) 70 MG tablet Take 1 tablet (70 mg total) by mouth every 7 (seven) days. Take with a full glass of water on an empty stomach. 4 tablet 11  . atorvastatin (LIPITOR) 40 MG tablet TAKE 1 TABLET BY MOUTH EVERYDAY AT BEDTIME 90 tablet 0  . Calcium Carbonate-Vitamin D (CALTRATE 600+D) 600-400 MG-UNIT tablet Take 1 tablet by mouth 2 (two) times daily. 60 tablet 11  . [facility] CALCIUM 600+D 600-800 MG-UNIT TABS Take 1 tablet by mouth 2 (two) times daily.  11  . diazepam (VALIUM) 5 MG tablet Take 1 tablet in the am, 1/2 tablet at lunch and  1 tablet at night 75 tablet 2  . Docusate Calcium (STOOL SOFTENER PO) Take 3 tablets by mouth daily.    Marland Kitchen ELIQUIS 5 MG TABS tablet TAKE 1 TABLET BY MOUTH TWICE A DAY 60 tablet 2  . gabapentin (NEURONTIN) 300 MG capsule Take 1 po BID and four po QHS 540 capsule 1  . levocetirizine (XYZAL) 5 MG tablet TAKE 1 TABLET BY MOUTH EVERY DAY IN THE EVENING 30 tablet 3  . levothyroxine (SYNTHROID, LEVOTHROID) 88 MCG tablet Take 1 tablet (88 mcg total) by mouth daily. 30 tablet 3  . LINZESS 290 MCG CAPS capsule TAKE 1 CAPSULE BY MOUTH DAILY ON EMPTY STOMACH AT LEAST 30 MINUTES PRIOR TO 1ST MEAL OF THE DAY 90 capsule 1  . lisinopril (PRINIVIL,ZESTRIL) 40 MG  tablet TAKE 1 TABLET BY MOUTH EVERY DAY 90 tablet 1  . mirabegron ER (MYRBETRIQ) 50 MG TB24 tablet Take 1 tablet (50 mg total) by mouth daily. 90 tablet 1  . mirtazapine (REMERON) 45 MG tablet Take 1 tablet (45 mg total) by mouth at bedtime. 90 tablet 1  . montelukast (SINGULAIR) 10 MG tablet TAKE 1 TABLET BY MOUTH EVERYDAY AT BEDTIME 90 tablet 1  . OLANZapine (ZYPREXA) 5 MG tablet Take 10 mg by mouth at bedtime.    Marland Kitchen omeprazole (PRILOSEC) 40 MG capsule Take 1 capsule (40 mg total) by mouth daily. 90 capsule 3  . PROAIR HFA 108 (90 Base) MCG/ACT inhaler INHALE 2 PUFFS EVERY 4 HOURS AS NEEDED FOR WHEEZING OR SHORTNESS OF BREATH 8.5 Inhaler 0  . Probiotic Product (PROBIOTIC DAILY PO) Take 2 tablets by mouth daily.    . SYMBICORT 80-4.5 MCG/ACT inhaler TAKE 2 PUFFS BY MOUTH TWICE A DAY 30.6 Inhaler 1   No Known Allergies    Family History  Problem Relation Age of Onset  . Breast cancer Mother   . Arthritis Mother   . Hearing loss Mother   . Hyperlipidemia Mother   . Hypertension Mother   . Anxiety disorder Mother   . Lung cancer Maternal Uncle   . Anxiety disorder Maternal Uncle   . Lung cancer Maternal Grandmother   . Heart attack Maternal Grandmother   . Anxiety disorder Maternal Grandmother   . Asthma Father   . COPD Father   . Hyperlipidemia Father   . Hypertension Father   . Alcohol abuse Father   . Anxiety disorder Sister   . Hypertension Sister   . Thyroid cancer Sister   . Anxiety disorder Brother   . Hypertension Brother   . Drug abuse Son   . Anxiety disorder Sister   . Hypertension Sister   . Anxiety disorder Sister   . Hypertension Sister   . Hyperlipidemia Son      Review of Systems:  Constitutional:  no unexpected change in weight, no weakness, no unexplained fevers, sweats, or chills Eye:  no recent significant change in vision Ear:  no hearing loss Nose/Mouth/Throat:  No new dental complaints Neck/Thyroid:  no lumps or masses Pulmonary:  No hemoptysis  and no shortness of breath; + chronic cough Cardiovascular:  no exercise intolerance, no chest pain Gastrointestinal:  no abdominal pain and no change in bowel habits GU:  negative for dysuria, frequency, and incontinence Musculoskeletal/Extremities:  no peripheral edema Skin/Integumentary ROS:  no abnormal skin lesions reported Neurologic:  no numbness, tingling, or tremor   Objective:   Vitals:   07/02/18 1309  BP: 110/70  Pulse: 82  Temp: 98.8 F (37.1 C)  TempSrc: Oral  SpO2: 93%  Weight: 133 lb 6 oz (60.5 kg)  Height: 5' (1.524 m)   Body mass index is 26.05 kg/m.  General:  well developed, well nourished, in no apparent distress Skin:  warm, no pallor or diaphoresis Head:  normocephalic, atraumatic Eyes:  pupils equal and round, sclera anicteric without injection Ears:  canals without lesions, TMs shiny without retraction, no obvious effusion, no erythema Throat/Pharynx:  lips and gingiva without lesion; tongue and uvula midline; non-inflamed pharynx; no exudates or postnasal drainage; the front maxillary right central incisor is chipped laterally, there are missing molars on the mandibular side, dental decay also noted Neck: neck supple without adenopathy, thyromegaly, or masses, no bruits, no jugular venous distention Lungs: Distant breath sounds, otherwise clear to auscultation, breath sounds equal bilaterally, no respiratory distress Cardio:  regular rate and rhythm without murmurs Abdomen:  abdomen soft, nontender; bowel sounds normal; no masses, hepatomegaly or splenomegaly Musculoskeletal:  symmetrical muscle groups noted without atrophy or deformity Extremities:  no clubbing, cyanosis, or edema, no deformities, no skin discoloration Neuro:  gait normal; deep tendon reflexes normal and symmetric and alert and oriented to person, place, and time Psych: Age appropriate judgment and insight; normal mood   Assessment:   Pre-operative clearance - Plan: EKG 12-Lead    Plan:   Orders as above.  EKG - normal EKG, normal sinus rhythm Hold Eliquis 2 full days (4 doses not including the day of surgery) prior to procedure.  No other medication requires augmentation. Pending the above workup, the patient is deemed low cardiac risk (0.4 % according to Revised Goldman's) for the proposed procedure. She was instructed not to quit smoking prior to the procedure due to regrowth of cilia causing coughing. The patient voiced understanding and agreement to the plan.  Jilda Roche [place], DO 07/02/18  1:57 PM

## 2018-07-02 NOTE — Patient Instructions (Addendum)
No labs are needed.  I will message Dr. Ardelle AntonWagoner the clearance after looking at EKG.  You will need to stop the Eliquis prior to your surgery. You will miss 2 full days before your surgery. For instance, if your surgery is on a Friday, you will not take any doses Wednesday and Thursday. No doses the day of your surgery as well. Let me know if this does not make sense.  Let us know if you need anything.

## 2018-07-02 NOTE — Progress Notes (Signed)
Pre visit review using our clinic review tool, if applicable. No additional management support is needed unless otherwise documented below in the visit note. 

## 2018-07-04 ENCOUNTER — Encounter: Payer: Self-pay | Admitting: Gastroenterology

## 2018-07-05 ENCOUNTER — Telehealth: Payer: Self-pay | Admitting: *Deleted

## 2018-07-05 NOTE — Telephone Encounter (Signed)
"  I am a patient of Dr. Jacqualyn Posey.  He wanted to get clearance from my doctor before he scheduled surgery.  I got the clearance from my doctor and he was supposed to let you all know.  So, I want to know if he received it."  Yes, he received it and he said you need to schedule an appointment to see him for a surgery consultation.  "I already had a consult with him and he went over the procedure in detail.  Why do I need to see him again?"  He will go the procedure in detail, you will sign a three page consent form, and you will receive your surgical kit.  "Okay send me back to the scheduler so I can make an appointment."  I transferred her to Theo Dills so she could schedule an appointment.

## 2018-07-09 ENCOUNTER — Ambulatory Visit: Payer: PPO | Admitting: Podiatry

## 2018-07-09 ENCOUNTER — Encounter: Payer: Self-pay | Admitting: Podiatry

## 2018-07-09 VITALS — BP 114/66 | HR 81 | Resp 16

## 2018-07-09 DIAGNOSIS — M2012 Hallux valgus (acquired), left foot: Secondary | ICD-10-CM

## 2018-07-09 NOTE — Patient Instructions (Signed)

## 2018-07-09 NOTE — Progress Notes (Signed)
Subjective:    Patient ID: Brenda Dunn, female    DOB: Sep 24, 1950, 67 y.o.   MRN: [id number]  HPI 67 year old female presents the office today to discuss surgery on her left foot.  She states that she is continue to get pain bony deformities she wants to proceed with surgery at this point.  She is attempted conservative treatment including offloading, padding and a significant improvement.  She has been cleared by her primary care physician to have surgery.   Review of Systems  All other systems reviewed and are negative.  Past Medical History:  Diagnosis Date  . Anxiety   . Benzodiazepine dependence (HCC)   . Colonic polyp    mulitple polyps. repeat in 05/2019  . Constipation    IBS  . COPD (chronic obstructive pulmonary disease) (HCC)   . Depression   . GERD (gastroesophageal reflux disease)   . Hyperlipidemia   . Hypertension   . Insomnia   . Migraine headache   . OAB (overactive bladder)   . Osteoarthritis   . Osteoporosis 12/19/2017  . Post-surgical hypothyroidism   . Pulmonary embolism (HCC)    Eliquis to stop 05/03/19  . Scoliosis     Past Surgical History:  Procedure Laterality Date  . BREAST CYST ASPIRATION Left   . HEMORRHOID SURGERY    . TH[facility]ROIDECTOMY     at [facility]  . TUBAL LIGATION  1979     Current Outpatient Medications:  .  alendronate (FOSAMAX) 70 MG tablet, Take 1 tablet (70 mg total) by mouth every 7 (seven) days. Take with a full glass of water on an empty stomach., Disp: 4 tablet, Rfl: 11 .  atorvastatin (LIPITOR) 40 MG tablet, TAKE 1 TABLET BY MOUTH EVERYDAY AT BEDTIME, Disp: 90 tablet, Rfl: 0 .  Calcium Carbonate-Vitamin D (CALTRATE 600+D) 600-400 MG-UNIT tablet, Take 1 tablet by mouth 2 (two) times daily., Disp: 60 tablet, Rfl: 11 .  [facility] CALCIUM 600+D 600-800 MG-UNIT TABS, Take 1 tablet by mouth 2 (two) times daily., Disp: , Rfl: 11 .  diazepam (VALIUM) 5 MG tablet, Take 1 tablet in the am, 1/2 tablet at lunch and 1 tablet at night, Disp: 75  tablet, Rfl: 2 .  Docusate Calcium (STOOL SOFTENER PO), Take 3 tablets by mouth daily., Disp: , Rfl:  .  ELIQUIS 5 MG TABS tablet, TAKE 1 TABLET BY MOUTH TWICE A DAY, Disp: 60 tablet, Rfl: 2 .  gabapentin (NEURONTIN) 300 MG capsule, Take 1 po BID and four po QHS, Disp: 540 capsule, Rfl: 1 .  levocetirizine (XYZAL) 5 MG tablet, TAKE 1 TABLET BY MOUTH EVERY DAY IN THE EVENING, Disp: 30 tablet, Rfl: 3 .  levothyroxine (SYNTHROID, LEVOTHROID) 88 MCG tablet, Take 1 tablet (88 mcg total) by mouth daily., Disp: 30 tablet, Rfl: 3 .  LINZESS 290 MCG CAPS capsule, TAKE 1 CAPSULE BY MOUTH DAILY ON EMPTY STOMACH AT LEAST 30 MINUTES PRIOR TO 1ST MEAL OF THE DAY, Disp: 90 capsule, Rfl: 1 .  lisinopril (PRINIVIL,ZESTRIL) 40 MG tablet, TAKE 1 TABLET BY MOUTH EVERY DAY, Disp: 90 tablet, Rfl: 1 .  mirabegron ER (MYRBETRIQ) 50 MG TB24 tablet, Take 1 tablet (50 mg total) by mouth daily., Disp: 90 tablet, Rfl: 1 .  mirtazapine (REMERON) 45 MG tablet, Take 1 tablet (45 mg total) by mouth at bedtime., Disp: 90 tablet, Rfl: 1 .  montelukast (SINGULAIR) 10 MG tablet, TAKE 1 TABLET BY MOUTH EVERYDAY AT BEDTIME, Disp: 90 tablet, Rfl: 1 .  OLANZapine (ZYPREXA)  5 MG tablet, Take 10 mg by mouth at bedtime., Disp: , Rfl:  .  omeprazole (PRILOSEC) 40 MG capsule, Take 1 capsule (40 mg total) by mouth daily., Disp: 90 capsule, Rfl: 3 .  PROAIR HFA 108 (90 Base) MCG/ACT inhaler, INHALE 2 PUFFS EVERY 4 HOURS AS NEEDED FOR WHEEZING OR SHORTNESS OF BREATH, Disp: 8.5 Inhaler, Rfl: 0 .  Probiotic Product (PROBIOTIC DAILY PO), Take 2 tablets by mouth daily., Disp: , Rfl:  .  SYMBICORT 80-4.5 MCG/ACT inhaler, TAKE 2 PUFFS BY MOUTH TWICE A DAY, Disp: 30.6 Inhaler, Rfl: 1  No Known Allergies      Objective:   Physical Exam  General: AAO x3, NAD  Dermatological: Skin is warm, dry and supple bilateral. There are no open sores, no preulcerative lesions, no rash or signs of infection present.  Vascular: Dorsalis Pedis artery and  Posterior Tibial artery pedal pulses are 2/4 bilateral with immedate capillary fill time. Pedal hair growth present. No varicosities and no lower extremity edema present bilateral. There is no pain with calf compression, swelling, warmth, erythema.  Denies any claudication symptoms.  Neruologic: Grossly intact via light touch bilateral.   Musculoskeletal: Significant bunion deformities present on the left foot.  There is mild restriction of first MPJ range of motion and dorsiflexion.  Tenderness palpation directly on the bunion site and there is mild erythema from her rubs inside of her shoes.  No first ray hypermobility is present.  Muscular strength 5/5 in all groups tested bilateral.  Gait: Unassisted, Nonantalgic.     Assessment & Plan:  67 year old female with significant bunion left foot -Treatment options discussed including all alternatives, risks, and complications -Etiology of symptoms were discussed -I again reviewed the x-rays with her today.  We had a long discussion regards to both conservative as well as surgical options.  Ideally I think she would benefit more from a Lapidus bunionectomy but given her history of smoking, osteoporosis as well as blood clots I do not think I would be beneficial for her given complication rate.  Discussed with her Lorenz CoasterKeller bunionectomy with possible pin placement.  Discussed with her the surgery as well as postoperative course.  We discussed this and operative provide full correction but I think it will help her symptoms quite a bit. -The incision placement as well as the postoperative course was discussed with the patient. I discussed risks of the surgery which include, but not limited to, infection, bleeding, pain, swelling, need for further surgery, delayed or nonhealing, painful or ugly scar, numbness or sensation changes, over/under correction, recurrence, transfer lesions, further deformity, hardware failure, DVT/PE, loss of toe/foot. Patient  understands these risks and wishes to proceed with surgery. The surgical consent was reviewed with the patient all 3 pages were signed. No promises or guarantees were given to the outcome of the procedure. All questions were answered to the best of my ability. Before the surgery the patient was encouraged to call the office if there is any further questions. [facility] basis.  Vivi BarrackMatthew R Tiarrah Saville DPM

## 2018-07-18 ENCOUNTER — Encounter: Payer: Self-pay | Admitting: Podiatry

## 2018-07-18 ENCOUNTER — Other Ambulatory Visit: Payer: Self-pay | Admitting: Podiatry

## 2018-07-18 DIAGNOSIS — E7800 Pure hypercholesterolemia, unspecified: Secondary | ICD-10-CM | POA: Diagnosis not present

## 2018-07-18 DIAGNOSIS — M2012 Hallux valgus (acquired), left foot: Secondary | ICD-10-CM | POA: Diagnosis not present

## 2018-07-18 DIAGNOSIS — M21612 Bunion of left foot: Secondary | ICD-10-CM | POA: Diagnosis not present

## 2018-07-18 DIAGNOSIS — M2011 Hallux valgus (acquired), right foot: Secondary | ICD-10-CM | POA: Diagnosis not present

## 2018-07-18 DIAGNOSIS — M25572 Pain in left ankle and joints of left foot: Secondary | ICD-10-CM | POA: Diagnosis not present

## 2018-07-18 MED ORDER — CEPHALEXIN 500 MG PO CAPS: 500.0000 mg | ORAL_CAPSULE | Freq: Three times a day (TID) | ORAL | 0 refills | Status: DC

## 2018-07-18 MED ORDER — PROMETHAZINE HCL 25 MG PO TABS: 25.0000 mg | ORAL_TABLET | Freq: Three times a day (TID) | ORAL | 0 refills | Status: DC | PRN

## 2018-07-18 MED ORDER — OXYCODONE-ACETAMINOPHEN 5-325 MG PO TABS: 1.0000 | ORAL_TABLET | Freq: Four times a day (QID) | ORAL | 0 refills | Status: DC | PRN

## 2018-07-18 NOTE — Progress Notes (Signed)
Post op medications sent to pharmacy 

## 2018-07-19 ENCOUNTER — Telehealth: Payer: Self-pay | Admitting: Podiatry

## 2018-07-19 ENCOUNTER — Encounter: Payer: PPO | Admitting: Family Medicine

## 2018-07-19 NOTE — Telephone Encounter (Signed)
Pts husband called with quick question regarding pts boot. Please give him a call back.

## 2018-07-19 NOTE — Telephone Encounter (Signed)
Left message to call again concerning the boot.

## 2018-07-20 ENCOUNTER — Telehealth: Payer: Self-pay | Admitting: *Deleted

## 2018-07-20 NOTE — Telephone Encounter (Signed)
Called and spoke with patient yesterday and patient stated that she was doing good and that the numbness was wearing off and there was no fever or chills or nausea and has been elevating and has been able to take the boot off and did take 2 tablets of  pain medicine at 5:00 am and I stated to call the office if any concerns or questions. Misty StanleyLisa

## 2018-07-26 ENCOUNTER — Ambulatory Visit (INDEPENDENT_AMBULATORY_CARE_PROVIDER_SITE_OTHER): Payer: PPO

## 2018-07-26 ENCOUNTER — Ambulatory Visit (INDEPENDENT_AMBULATORY_CARE_PROVIDER_SITE_OTHER): Payer: PPO | Admitting: Podiatry

## 2018-07-26 ENCOUNTER — Telehealth: Payer: Self-pay | Admitting: Podiatry

## 2018-07-26 DIAGNOSIS — M2012 Hallux valgus (acquired), left foot: Secondary | ICD-10-CM

## 2018-07-26 MED ORDER — OXYCODONE-ACETAMINOPHEN 10-325 MG PO TABS: 1.0000 | ORAL_TABLET | Freq: Four times a day (QID) | ORAL | 0 refills | Status: DC | PRN

## 2018-07-26 NOTE — Telephone Encounter (Signed)
I informed pt of pharmacist's statement.

## 2018-07-26 NOTE — Telephone Encounter (Signed)
[facility] not [facility] because of she can't get it filled before the middle of January, because it was 30 days.

## 2018-07-26 NOTE — Progress Notes (Signed)
Subjective: Brenda Dunn is a 67 y.o. is seen today in office s/p left foot Keller bunionectomy preformed on 07/18/2018.  She states that she is taking 2 Percocet at a time and she does not like pain she is asking for new prescription for pain medication.  She is been wearing the cam boot although she is very happy with the boot.  She says it is too big and she cannot walk and it.  She is been keeping her foot elevated since I saw her and she feels that she has been in "jail" for the last week. Denies any systemic complaints such as fevers, chills, nausea, vomiting. No calf pain, chest pain, shortness of breath.   Objective: General: No acute distress, AAOx3  DP/PT pulses palpable 2/4, CRT < 3 sec to all digits.  Protective sensation intact. Motor function intact.  LEFT foot: Incision is well coapted without any evidence of dehiscence and sutures are intact. There is no surrounding erythema, ascending cellulitis, fluctuance, crepitus, malodor, drainage/purulence. There is minimal edema around the surgical site. There is minimal pain along the surgical site.  No pain or crepitation with MPJ range of motion No other areas of tenderness to bilateral lower extremities.  No other open lesions or pre-ulcerative lesions.  No pain with calf compression, swelling, warmth, erythema.   Assessment and Plan:  Status post left foot surgery, doing well with no complications   -Treatment options discussed including all alternatives, risks, and complications -X-rays were obtained reviewed.  Status post arthroplasty of the first MPJ.  No evidence of acute fracture. -Overall she is doing better.  I did refill pain medication for her today.  I want her to start to decrease this as she is able to.  A smaller bandage was applied today.  Also dispensed a surgical shoe for her to wear.  She was very unhappy with her cam boot.  Discussed that I still think she needed that after surgery to limit her weightbearing and  pressure to the foot and it did its job.  Although we are putting her into a surgical shoe today she still needs to limit activity. -Ice/elevation -Pain medication as needed. -Monitor for any clinical signs or symptoms of infection and DVT/PE and directed to call the office immediately should any occur or go to the ER. -Follow-up next week or sooner if any problems arise. In the meantime, encouraged to call the office with any questions, concerns, change in symptoms.   Ovid CurdMatthew Hilda Rynders, DPM

## 2018-07-26 NOTE — Telephone Encounter (Signed)
I spoke with [facility], she stated pt is on other medications that may increase the effects of the the percocet, but she checked and was able to push it through.

## 2018-07-26 NOTE — Telephone Encounter (Signed)
Patient was seen in office today and her pharmacy is unable to refill Percocet until 08/18/2018. She was wondering could Dr. Ardelle AntonWagoner send in another medication.

## 2018-07-28 ENCOUNTER — Other Ambulatory Visit: Payer: Self-pay | Admitting: Family Medicine

## 2018-08-03 ENCOUNTER — Telehealth: Payer: Self-pay | Admitting: *Deleted

## 2018-08-03 ENCOUNTER — Ambulatory Visit (INDEPENDENT_AMBULATORY_CARE_PROVIDER_SITE_OTHER): Payer: PPO | Admitting: Podiatry

## 2018-08-03 DIAGNOSIS — Z9889 Other specified postprocedural states: Secondary | ICD-10-CM

## 2018-08-03 DIAGNOSIS — M2012 Hallux valgus (acquired), left foot: Secondary | ICD-10-CM

## 2018-08-03 DIAGNOSIS — M205X2 Other deformities of toe(s) (acquired), left foot: Secondary | ICD-10-CM

## 2018-08-03 MED ORDER — OXYCODONE-ACETAMINOPHEN 10-325 MG PO TABS: 1.0000 | ORAL_TABLET | Freq: Four times a day (QID) | ORAL | 0 refills | Status: DC | PRN

## 2018-08-03 NOTE — Telephone Encounter (Signed)
I informed [facility], Dr. Ardelle Anton had refilled the Percocet and it was okay to give to pt today.

## 2018-08-07 ENCOUNTER — Ambulatory Visit: Payer: PPO | Admitting: Gastroenterology

## 2018-08-08 ENCOUNTER — Telehealth: Payer: Self-pay | Admitting: Podiatry

## 2018-08-08 ENCOUNTER — Other Ambulatory Visit: Payer: Self-pay | Admitting: Podiatry

## 2018-08-08 MED ORDER — OXYCODONE-ACETAMINOPHEN 10-325 MG PO TABS: 1.0000 | ORAL_TABLET | Freq: Four times a day (QID) | ORAL | 0 refills | Status: DC | PRN

## 2018-08-08 NOTE — Telephone Encounter (Signed)
I have sent it to her pharmacy. Can you please call the pharmacy and confirm it? Also before we have had to call the pharmacist to OK it.   I will not be in the office the rest of the day but if it needs to be resent, can you please ask Dr. Logan Bores? Thanks.

## 2018-08-08 NOTE — Telephone Encounter (Signed)
Unable to check status of pt's percocet ordered by Dr. Ardelle Anton, pharmacy's phone was busy.

## 2018-08-08 NOTE — Telephone Encounter (Signed)
Pt called requesting refill of percocet. Stating that her pharmacy would not refill it within 30 days and was hoping the prescription could be called anyway.

## 2018-08-08 NOTE — Progress Notes (Signed)
Subjective: Brenda Dunn is a 68 y.o. is seen today in office s/p left foot Keller bunionectomy preformed on 07/18/2018.  Since last saw her she states that she is feeling better.  She states that she still gets some tingling, burning at times however she does relate that this is intermittent and not continuous.  She has been keeping her foot elevated.  She states that she is been trying to move her toe when she has been able to do so without any significant pain.  She is remained in the surgical shoe.  She has no other concerns today. Denies any systemic complaints such as fevers, chills, nausea, vomiting. No calf pain, chest pain, shortness of breath.   Objective: General: No acute distress, AAOx3  DP/PT pulses palpable 2/4, CRT < 3 sec to all digits.  Protective sensation intact. Motor function intact.  LEFT foot: Incision is well coapted without any evidence of dehiscence and sutures are intact.  Overall incision appears to be healing well without any signs of dehiscence or infection.  There is no pain or crepitation with MPJ range of motion.  There is no significant tenderness palpation on exam today but subjectively she states that she still gets some sharp pain at times.  She feels this is coming more from the incision.  Mild swelling present.  No other areas of tenderness to bilateral lower extremities.  No other open lesions or pre-ulcerative lesions.  No pain with calf compression, swelling, warmth, erythema.   Assessment and Plan:  Status post left foot surgery, doing well with no complications   -Treatment options discussed including all alternatives, risks, and complications -Today the suture ends were cut.  She can start to shower tomorrow and I want her to dry thoroughly and apply antibiotic ointment and a bandage.  Continue the surgical shoe for now.  At home she states that she has been wearing a bedroom slipper without any significant pain when doing this.  I would like for her to  move to a stiff bottom shoe to help ensure that the incision is healing.  Continue range of motion exercises. -Refill pain medicine today. -Monitor for any clinical signs or symptoms of infection and directed to call the office immediately should any occur or go to the ER.  Vivi Barrack DPM

## 2018-08-08 NOTE — Telephone Encounter (Signed)
I informed [facility], Dr. Ardelle Anton had said it was okay to refill pt's Percocet at this time.

## 2018-08-08 NOTE — Telephone Encounter (Signed)
I called pt and she states she is walking more and is has throbbing pain but no swelling. Pt states she gets up at 6:00am and goes to bed at 10:00pm so she takes 3 pain pills a day.

## 2018-08-17 ENCOUNTER — Telehealth: Payer: Self-pay | Admitting: *Deleted

## 2018-08-17 ENCOUNTER — Ambulatory Visit (INDEPENDENT_AMBULATORY_CARE_PROVIDER_SITE_OTHER): Payer: PPO | Admitting: Podiatry

## 2018-08-17 ENCOUNTER — Encounter: Payer: Self-pay | Admitting: Podiatry

## 2018-08-17 ENCOUNTER — Ambulatory Visit (INDEPENDENT_AMBULATORY_CARE_PROVIDER_SITE_OTHER): Payer: PPO

## 2018-08-17 DIAGNOSIS — M2012 Hallux valgus (acquired), left foot: Secondary | ICD-10-CM | POA: Diagnosis not present

## 2018-08-17 MED ORDER — OXYCODONE-ACETAMINOPHEN 10-325 MG PO TABS: 1.0000 | ORAL_TABLET | Freq: Four times a day (QID) | ORAL | 0 refills | Status: DC | PRN

## 2018-08-17 MED ORDER — PROMETHAZINE HCL 25 MG PO TABS: 25.0000 mg | ORAL_TABLET | Freq: Three times a day (TID) | ORAL | 0 refills | Status: DC | PRN

## 2018-08-17 NOTE — Telephone Encounter (Signed)
I informed [facility] of [facility] Gabriel Rung order for Percocet.

## 2018-08-18 NOTE — Progress Notes (Signed)
Subjective: Brenda Dunn is a 68 y.o. is seen today in office s/p left foot Keller bunionectomy preformed on 07/18/2018.  Brenda Dunn states that Brenda Dunn still having quite a bit of pain to the surgical site.  Brenda Dunn states that Brenda Dunn still getting swelling but there are days that the foot looks normal and Brenda Dunn is not having any swelling.  Brenda Dunn is going barefoot around the house or with a sock.  Brenda Dunn states is been keeping Brenda Dunn foot elevated.  Brenda Dunn does not really touch the toe today.  Brenda Dunn denies any recent injury or fall.  Brenda Dunn has been working on range of motion exercises to Brenda Dunn toe and this has not been hurting.  Brenda Dunn describes more of a burning, sharp pain Brenda Dunn points on the medial aspect the foot on the bunion site where Brenda Dunn gets majority of tenderness. Brenda Dunn has no other concerns today. Denies any systemic complaints such as fevers, chills, nausea, vomiting. No calf pain, chest pain, shortness of breath.   Objective: General: No acute distress, AAOx3  DP/PT pulses palpable 2/4, CRT < 3 sec to all digits.  Protective sensation intact. Motor function intact.  LEFT foot: Incision is well coapted without any evidence of dehiscence and sutures are intact.  Incisions healing without any signs of infection.  There is no drainage or pus and there is no increase in warmth.  Brenda Dunn takes Brenda Dunn toe today and Brenda Dunn has been doing in dorsiflexion and plantar flexion without any pain but Brenda Dunn did not want me to do it.  There is some mild to moderate swelling on the surgical site but there is no increase in warmth.  Minimal erythema this is more from inflammation as opposed to infection.  Brenda Dunn describes a more sharp, electric type pain.  This is intermittent. No other areas of tenderness to bilateral lower extremities.  No other open lesions or pre-ulcerative lesions.  No pain with calf compression, swelling, warmth, erythema.   Assessment and Plan:  Status post left foot surgery, with continued pain  -Treatment options discussed including all  alternatives, risks, and complications -X-rays were obtained and reviewed.  Status post Lorenz Coaster bunionectomy.  No evidence of acute fracture. -I want Brenda Dunn to continue range of motion exercises.  I was then applied in the boot or compression bandage but Brenda Dunn declined this today.  Brenda Dunn goes barefoot at home and I want Brenda Dunn to wear the surgical boot or shoe at all times to help offload the area or at least a stiffer soled shoe.  Continue to ice and elevate as well.  I refilled Brenda Dunn pain medication for Brenda Dunn today.  Brenda Dunn states that hopefully this is the last prescription. -Once pain is improved we will likely do physical therapy.  Brenda Dunn was hesitant on this. -Monitor for any clinical signs or symptoms of infection and directed to call the office immediately should any occur or go to the ER.  Vivi Barrack DPM

## 2018-08-24 ENCOUNTER — Telehealth: Payer: Self-pay | Admitting: *Deleted

## 2018-08-24 ENCOUNTER — Ambulatory Visit (INDEPENDENT_AMBULATORY_CARE_PROVIDER_SITE_OTHER): Payer: Self-pay | Admitting: Podiatry

## 2018-08-24 DIAGNOSIS — M2012 Hallux valgus (acquired), left foot: Secondary | ICD-10-CM

## 2018-08-24 DIAGNOSIS — M205X2 Other deformities of toe(s) (acquired), left foot: Secondary | ICD-10-CM

## 2018-08-24 MED ORDER — DICLOFENAC SODIUM 1 % TD GEL: 2.0000 g | Freq: Four times a day (QID) | TRANSDERMAL | 2 refills | Status: DC

## 2018-08-24 MED ORDER — OXYCODONE-ACETAMINOPHEN 5-325 MG PO TABS: 1.0000 | ORAL_TABLET | Freq: Four times a day (QID) | ORAL | 0 refills | Status: DC | PRN

## 2018-08-24 MED ORDER — CEPHALEXIN 500 MG PO CAPS: 500.0000 mg | ORAL_CAPSULE | Freq: Three times a day (TID) | ORAL | 0 refills | Status: DC

## 2018-08-24 NOTE — Telephone Encounter (Signed)
I called patient at [phone] (cell #) to let them know that Dr. Ardelle Anton sent to their pharmacy:  1.  Keflex 2.  Anti-inflammatory Cream 3.  Pain Medication  Patient stated, "I need to talk to Nadine Counts and make sure my insurance covers my anti-inflammatory cream". I told patient that our office closes at 2:00 pm today.  I told the patient that Dr. Ardelle Anton wanted her to be an an antibiotic (Keflex) just as a precaution.  Patient stated she understood.

## 2018-08-29 ENCOUNTER — Telehealth: Payer: Self-pay | Admitting: *Deleted

## 2018-08-29 NOTE — Telephone Encounter (Signed)
Called and left a message for the patient to call me back. Brenda Dunn 

## 2018-08-29 NOTE — Progress Notes (Signed)
Subjective: Brenda Dunn is a 68 y.o. is seen today in office s/p left foot Keller bunionectomy preformed on 07/18/2018.  She states that she is still having quite a bit of pain.  She takes a Percocet in the morning and she does well for couple hours that she can actually do some activities but after that she states the foot starts to hurt and she does not do anything and she does not take any further pain medication.  She states that the surgical shoe makes her foot hurt worse.  She does not wear the surgical shoe when she goes barefoot at home.  She is also showing that she can move her toe when she does range of motion exercises at home as well.  She states the swelling has improved but she gets more pain underneath the ball the foot.  She says the incision is healed and she has been soaking her foot.  Denies any recent injury or trauma.Denies any systemic complaints such as fevers, chills, nausea, vomiting. No calf pain, chest pain, shortness of breath.   Objective: General: No acute distress, AAOx3  DP/PT pulses palpable 2/4, CRT < 3 sec to all digits.  Protective sensation intact. Motor function intact.  LEFT foot: Incision is well coapted without any evidence of dehiscence and scar is formed.  There is no surrounding erythema, ascending cellulitis.  No fluctuation crepitation.  There is some mild to moderate swelling to the surgical site but appears to be more submetatarsal 1.  There is no increase in warmth identified to the area today.  There is still tenderness palpation.  When I first met her before the surgery she states that she does not do well with pain and the pain has been bad since immediately after surgery.  However after talking to her it does get better with the pain medicine in the morning she does okay.  She states that it sometimes she has no swelling to her foot and it looks the same as the other foot.  As the foot swell she gets more pain.  She has been trying to keep it elastic Ace  bandage on the area to help with the swelling.  She still describes sharp intermittent pain. No other areas of tenderness to bilateral lower extremities.  No other open lesions or pre-ulcerative lesions.  No pain with calf compression, swelling, warmth, erythema.   Assessment and Plan:  Status post left foot surgery, with continued pain  -Treatment options discussed including all alternatives, risks, and complications -When trying to talk to her in trying to discuss treatment options she interrupts and she does not want to do anything that I asked.  I try to get her into a different type of Darco wedge shoe to offload the area but she declines this.  She states that she just wants the pain to go away.  When I try to discuss ways to help with this in order for her be more mobile and to become more active outside the house she states that she does not feel like leaving the house.  I spent quite a bit of time with her trying to explain trying to help her become more the work-up which she feels that she can leave and become more active.  After discussion I refilled her pain medicine today.  She was not taking only 1 pill a day because she was trying to ration them out.  Discussed that we can refill it for her if needed.  I also prescribed Voltaren gel that she can use topically.  Continue with compression and ice.  At the surgical shoe is helping I want her to go back into trying to wear a regular shoe.  She said that she feels much better without the surgical shoe.  Also as a precaution I will prescribe Keflex although I do not think this is infection I think it swelling from postop course however as a precaution we will do this.  We will continue to keep a close eye on her and I discussed with her she can always call the office if there is any questions or concerns or any worsening.  She thanked me for this and was appreciative of the treatment I was trying to give for her today.  Trula Slade  DPM    -X-rays were obtained and reviewed.  Status post Jake Michaelis bunionectomy.  No evidence of acute fracture. -I want her to continue range of motion exercises.  I was then applied in the boot or compression bandage but she declined this today.  She goes barefoot at home and I want her to wear the surgical boot or shoe at all times to help offload the area or at least a stiffer soled shoe.  Continue to ice and elevate as well.  I refilled her pain medication for her today.  She states that hopefully this is the last prescription. -Once pain is improved we will likely do physical therapy.  She was hesitant on this. -Monitor for any clinical signs or symptoms of infection and directed to call the office immediately should any occur or go to the ER.  Trula Slade DPM

## 2018-08-30 ENCOUNTER — Telehealth: Payer: Self-pay | Admitting: *Deleted

## 2018-08-30 ENCOUNTER — Ambulatory Visit (INDEPENDENT_AMBULATORY_CARE_PROVIDER_SITE_OTHER): Payer: PPO | Admitting: Podiatry

## 2018-08-30 ENCOUNTER — Encounter: Payer: Self-pay | Admitting: Podiatry

## 2018-08-30 DIAGNOSIS — M205X2 Other deformities of toe(s) (acquired), left foot: Secondary | ICD-10-CM

## 2018-08-30 DIAGNOSIS — M2012 Hallux valgus (acquired), left foot: Secondary | ICD-10-CM

## 2018-08-30 DIAGNOSIS — Z9889 Other specified postprocedural states: Secondary | ICD-10-CM

## 2018-08-30 MED ORDER — OXYCODONE-ACETAMINOPHEN 5-325 MG PO TABS: 1.0000 | ORAL_TABLET | Freq: Four times a day (QID) | ORAL | 0 refills | Status: DC | PRN

## 2018-08-30 NOTE — Telephone Encounter (Signed)
[facility] - Kathee Polite states they have a prescription ready for pt, I told her Dr. Ardelle Anton had ordered Percocet today. Kathee Polite states understanding.

## 2018-09-10 NOTE — Progress Notes (Signed)
Subjective: Brenda Dunn is a 68 y.o. is seen today in office s/p left foot Keller bunionectomy preformed on 07/18/2018.  Overall she states that she is getting better.  She still getting some pain at nighttime but otherwise she is doing much better than I last saw her last week.  She states that the Voltaren gel is been very helpful.  She does take Percocet up to 3 times a day as needed.  She presents today with her regular sandal. Denies any recent injury or trauma.Denies any systemic complaints such as fevers, chills, nausea, vomiting. No calf pain, chest pain, shortness of breath.   Objective: General: No acute distress, AAOx3 -overall she looks much better today. DP/PT pulses palpable 2/4, CRT < 3 sec to all digits.  Protective sensation intact. Motor function intact.  LEFT foot: Incision is well coapted without any evidence of dehiscence and scar is formed.  There is decreased edema to the site.  There is no significant erythema there is no increase in warmth.  The incision is well-healed with any signs of infection.  She is able to move her toe without any discomfort.  Overall the sharp pain that she is getting is improving.   No other areas of tenderness to bilateral lower extremities.  No other open lesions or pre-ulcerative lesions.  No pain with calf compression, swelling, warmth, erythema.   Assessment and Plan:  Status post left foot surgery, with continued pain  -Treatment options discussed including all alternatives, risks, and complications -Upon discussion with her today she is doing better.  She is getting some pain at nighttime.  She has been sleeping in the compression anklet I want her not sleeping this because I think is getting too tight causing discomfort.  She can sleep not wearing any compression or just a light Ace bandage if she wants.  With a compression anklet during the day.  Continue ice and elevate.  Continue with the Voltaren gel.  Pain medication as needed I  refilled the Percocet today for her.  Hopefully we can start to decrease this over the next week or 2.  She presents today wearing a regular sandal and she feels much more comfortable on this.  Return in about 10 days (around 09/09/2018).  Vivi BarrackMatthew R Zhyon Antenucci DPM

## 2018-09-11 ENCOUNTER — Ambulatory Visit (INDEPENDENT_AMBULATORY_CARE_PROVIDER_SITE_OTHER): Payer: PPO | Admitting: Podiatry

## 2018-09-11 DIAGNOSIS — M2012 Hallux valgus (acquired), left foot: Secondary | ICD-10-CM

## 2018-09-11 DIAGNOSIS — Z9889 Other specified postprocedural states: Secondary | ICD-10-CM

## 2018-09-11 MED ORDER — OXYCODONE-ACETAMINOPHEN 5-325 MG PO TABS: 1.0000 | ORAL_TABLET | Freq: Four times a day (QID) | ORAL | 0 refills | Status: DC | PRN

## 2018-09-11 MED ORDER — PROMETHAZINE HCL 25 MG PO TABS: 25.0000 mg | ORAL_TABLET | Freq: Three times a day (TID) | ORAL | 0 refills | Status: DC | PRN

## 2018-09-12 NOTE — Progress Notes (Signed)
Subjective: Brenda Dunn is a 68 y.o. is seen today in office s/p left foot Keller bunionectomy preformed on 07/18/2018.  She states that overall she is getting better she started to walk better.  She does take Percocet about 3 times a day and this also helps.  She has been continue the Voltaren gel which is been helpful.  She states the swelling has improved and she was the compression anklet and she has been icing as well.  She is wearing a regular shoe and this feels better than the surgical site. Denies any recent injury or trauma.Denies any systemic complaints such as fevers, chills, nausea, vomiting. No calf pain, chest pain, shortness of breath.   Objective: General: No acute distress, AAOx3 -overall she appears to be feeling much better. DP/PT pulses palpable 2/4, CRT < 3 sec to all digits.  Protective sensation intact. Motor function intact.  LEFT foot: Incision is well coapted without any evidence of dehiscence and scar is formed.  There is still some swelling on the first MPJ but overall is getting better and there is no erythema or increase in warmth.  There is no clinical signs of infection.  I middle with MPJ through range of motion without any significant discomfort.  There is no other areas of tenderness elicited. No other areas of tenderness to bilateral lower extremities.  No other open lesions or pre-ulcerative lesions.  No pain with calf compression, swelling, warmth, erythema.   Assessment and Plan:  Status post left foot surgery, with continued pain although improving  -Treatment options discussed including all alternatives, risks, and complications -Overall she does seem to make progress midline last saw her.  I did refill the pain medication for her today.  I want her to continue with trying to the Voltaren gel as well as icing.  I want her to start to wean off the pain medicine if able.  Continue with a comfortable shoe.  Overall she is getting better.  I will see her back  next 2 to 3 weeks or sooner if any issues are to arise.  She has no further questions or concerns today and she is thankful.  *Will send today's note to Corie Chiquito at [facility]  Vivi Barrack DPM

## 2018-09-18 ENCOUNTER — Encounter: Payer: Self-pay | Admitting: Psychiatry

## 2018-09-18 ENCOUNTER — Telehealth: Payer: Self-pay | Admitting: Podiatry

## 2018-09-18 ENCOUNTER — Ambulatory Visit: Payer: PPO | Admitting: Psychiatry

## 2018-09-18 ENCOUNTER — Other Ambulatory Visit: Payer: Self-pay | Admitting: Podiatry

## 2018-09-18 VITALS — BP 117/77 | HR 78

## 2018-09-18 DIAGNOSIS — F411 Generalized anxiety disorder: Secondary | ICD-10-CM | POA: Diagnosis not present

## 2018-09-18 DIAGNOSIS — F5101 Primary insomnia: Secondary | ICD-10-CM

## 2018-09-18 DIAGNOSIS — F39 Unspecified mood [affective] disorder: Secondary | ICD-10-CM | POA: Diagnosis not present

## 2018-09-18 MED ORDER — MIRTAZAPINE 45 MG PO TABS: 45.0000 mg | ORAL_TABLET | Freq: Every day | ORAL | 1 refills | Status: DC

## 2018-09-18 MED ORDER — DIAZEPAM 5 MG PO TABS: ORAL_TABLET | ORAL | 3 refills | Status: DC

## 2018-09-18 MED ORDER — GABAPENTIN 400 MG PO CAPS: ORAL_CAPSULE | ORAL | 0 refills | Status: DC

## 2018-09-18 MED ORDER — OXYCODONE-ACETAMINOPHEN 5-325 MG PO TABS: 1.0000 | ORAL_TABLET | Freq: Four times a day (QID) | ORAL | 0 refills | Status: DC | PRN

## 2018-09-18 NOTE — Telephone Encounter (Signed)
I was calling to see if Dr. Ardelle Anton would call me in some pain medication.

## 2018-09-18 NOTE — Telephone Encounter (Signed)
I have sent it to her pharmacy 

## 2018-09-18 NOTE — Progress Notes (Signed)
Brenda Dunn [id number] January 17, 1951 68 y.o.  Subjective:   Patient ID:  Brenda Dunn is a 68 y.o. (DOB 10-Jun-1951) female.  Chief Complaint:  Chief Complaint  Patient presents with  . Insomnia  . Anxiety    HPI Brenda Dunn presents to the office today for follow-up of anxiety, mood, and insomnia. She reports that increase in HS gabapentin was helpful for insomnia and RLS at last visit. She reports that she had foot surgery on 07/18/18 and has been taking Percocet.  She reports that she has had increased difficulty sleeping with foot pain and restless legs. She reports chronic middle of the night and early morning awakenings. Reports that she was sleeping about 5 hours a night and that it has been less since her foot surgery. She reports that podiatrist mentioned considering increase in Gabapentin for pain. She reports that she has a low tolerance for pain.  She reports that she has been less active since foot surgery and that it is taking her longer to perform tasks and requires frequent rest and elevation of LE. She reports that her energy and motivation have been lower since her surgery. Reports that she has not "felt like" getting her hair done or putting on make-up. Denies sad mood. She reports "my patience is getting slim" with not being able to be as active as she would like after foot surgery. She reports that her anxiety "has been up." She reports that her appetite improved and was no longer excessive with decrease and d/c of Olanzapine at last visit. Denies any risky or impulsive behavior, to include excessive spending. Reports that her concentration is "same as it has always been, very short."Reports that she has difficulty focusing on a movie. Denies SI.     Review of Systems:  Review of Systems  Gastrointestinal: Positive for constipation.  Musculoskeletal: Positive for gait problem.       Toe pain after surgery and gait has been slowed  Neurological: Negative for tremors.   Psychiatric/Behavioral:       Please refer to HPI    Medications: I have reviewed the patient's current medications.  Current Outpatient Medications  Medication Sig Dispense Refill  . alendronate (FOSAMAX) 70 MG tablet Take 1 tablet (70 mg total) by mouth every 7 (seven) days. Take with a full glass of water on an empty stomach. 4 tablet 11  . atorvastatin (LIPITOR) 40 MG tablet TAKE 1 TABLET BY MOUTH EVERYDAY AT BEDTIME 90 tablet 0  . Calcium Carbonate-Vitamin D (CALTRATE 600+D) 600-400 MG-UNIT tablet Take 1 tablet by mouth 2 (two) times daily. 60 tablet 11  . diazepam (VALIUM) 5 MG tablet Take 1 tablet in the am, 1/2 tablet at lunch and 1 tablet at night 75 tablet 3  . diclofenac sodium (VOLTAREN) 1 % GEL Apply 2 g topically 4 (four) times daily. Rub into affected area of foot 2 to 4 times daily 100 g 2  . Docusate Calcium (STOOL SOFTENER PO) Take 3 tablets by mouth daily.    Marland Kitchen ELIQUIS 5 MG TABS tablet TAKE 1 TABLET BY MOUTH TWICE A DAY 60 tablet 2  . gabapentin (NEURONTIN) 300 MG capsule Take 1 po BID and four po QHS 540 capsule 1  . levocetirizine (XYZAL) 5 MG tablet TAKE 1 TABLET BY MOUTH EVERY DAY IN THE EVENING 30 tablet 3  . levothyroxine (SYNTHROID, LEVOTHROID) 88 MCG tablet Take 1 tablet (88 mcg total) by mouth daily. 30 tablet 3  . LINZESS 290 MCG  CAPS capsule TAKE 1 CAPSULE BY MOUTH DAILY ON EMPTY STOMACH AT LEAST 30 MINUTES PRIOR TO 1ST MEAL OF THE DAY 90 capsule 1  . lisinopril (PRINIVIL,ZESTRIL) 40 MG tablet TAKE 1 TABLET BY MOUTH EVERY DAY 90 tablet 1  . mirabegron ER (MYRBETRIQ) 50 MG TB24 tablet Take 1 tablet (50 mg total) by mouth daily. 90 tablet 1  . mirtazapine (REMERON) 45 MG tablet Take 1 tablet (45 mg total) by mouth at bedtime. 90 tablet 1  . montelukast (SINGULAIR) 10 MG tablet TAKE 1 TABLET BY MOUTH EVERYDAY AT BEDTIME 90 tablet 1  . omeprazole (PRILOSEC) 40 MG capsule Take 1 capsule (40 mg total) by mouth daily. 90 capsule 3  . Probiotic Product (PROBIOTIC DAILY  PO) Take 2 tablets by mouth daily.    . SYMBICORT 80-4.5 MCG/ACT inhaler TAKE 2 PUFFS BY MOUTH TWICE A DAY 30.6 Inhaler 1  . cephALEXin (KEFLEX) 500 MG capsule Take 1 capsule (500 mg total) by mouth 3 (three) times daily. (Patient not taking: Reported on 09/18/2018) 21 capsule 0  . [facility] CALCIUM 600+D 600-800 MG-UNIT TABS Take 1 tablet by mouth 2 (two) times daily.  11  . gabapentin (NEURONTIN) 400 MG capsule Take 1 capsule twice daily and 3 capsules at bedtime for 5-7 days, then may increase to 1 capsule BID and 4 capsules at bedtime. 540 capsule 0  . oxyCODONE-acetaminophen (PERCOCET/ROXICET) 5-325 MG tablet Take 1 tablet by mouth every 6 (six) hours as needed for severe pain. 15 tablet 0  . PROAIR HFA 108 (90 Base) MCG/ACT inhaler INHALE 2 PUFFS EVERY 4 HOURS AS NEEDED FOR WHEEZING OR SHORTNESS OF BREATH (Patient not taking: Reported on 09/18/2018) 8.5 Inhaler 0  . promethazine (PHENERGAN) 25 MG tablet Take 1 tablet (25 mg total) by mouth every 8 (eight) hours as needed for nausea or vomiting. 20 tablet 0   No current facility-administered medications for this visit.     Medication Side Effects: None  Allergies: No Known Allergies  Past Medical History:  Diagnosis Date  . Anxiety   . Benzodiazepine dependence (HCC)   . Colonic polyp    mulitple polyps. repeat in 05/2019  . Constipation    IBS  . COPD (chronic obstructive pulmonary disease) (HCC)   . Depression   . GERD (gastroesophageal reflux disease)   . Hyperlipidemia   . Hypertension   . Insomnia   . Migraine headache   . OAB (overactive bladder)   . Osteoarthritis   . Osteoporosis 12/19/2017  . Post-surgical hypothyroidism   . Pulmonary embolism (HCC)    Eliquis to stop 05/03/19  . Scoliosis     Family History  Problem Relation Age of Onset  . Breast cancer Mother   . Arthritis Mother   . Hearing loss Mother   . Hyperlipidemia Mother   . Hypertension Mother   . Anxiety disorder Mother   . Lung cancer Maternal Uncle    . Anxiety disorder Maternal Uncle   . Lung cancer Maternal Grandmother   . Heart attack Maternal Grandmother   . Anxiety disorder Maternal Grandmother   . Asthma Father   . COPD Father   . Hyperlipidemia Father   . Hypertension Father   . Alcohol abuse Father   . Anxiety disorder Sister   . Hypertension Sister   . Thyroid cancer Sister   . Anxiety disorder Brother   . Hypertension Brother   . Drug abuse Son   . Anxiety disorder Sister   . Hypertension Sister   .  Anxiety disorder Sister   . Hypertension Sister   . Hyperlipidemia Son     Social History   Socioeconomic History  . Marital status: Married    Spouse name: Not on file  . Number of children: Not on file  . Years of education: Not on file  . Highest education level: Not on file  Occupational History  . Occupation: disabled due "my nerves"  Social Needs  . Financial resource strain: Not on file  . Food insecurity:    Worry: Not on file    Inability: Not on file  . Transportation needs:    Medical: Not on file    Non-medical: Not on file  Tobacco Use  . Smoking status: Current Every Day Smoker    Packs/day: 0.50    Years: 44.00    Pack years: 22.00    Types: Cigarettes  . Smokeless tobacco: Never Used  Substance and Sexual Activity  . Alcohol use: No  . Drug use: No  . Sexual activity: Not on file  Lifestyle  . Physical activity:    Days per week: Not on file    Minutes per session: Not on file  . Stress: Not on file  Relationships  . Social connections:    Talks on phone: Not on file    Gets together: Not on file    Attends religious service: Not on file    Active member of club or organization: Not on file    Attends meetings of clubs or organizations: Not on file    Relationship status: Not on file  . Intimate partner violence:    Fear of current or ex partner: Not on file    Emotionally abused: Not on file    Physically abused: Not on file    Forced sexual activity: Not on file  Other  Topics Concern  . Not on file  Social History Narrative  . Not on file    Past Medical History, Surgical history, Social history, and Family history were reviewed and updated as appropriate.   Please see review of systems for further details on the patient's review from today.   Objective:   Physical Exam:  BP 117/77   Pulse 78   Physical Exam Constitutional:      General: She is not in acute distress.    Appearance: She is well-developed.  Musculoskeletal:        General: No deformity.  Neurological:     Mental Status: She is alert and oriented to person, place, and time.     Coordination: Coordination normal.  Psychiatric:        Attention and Perception: Attention and perception normal. She does not perceive auditory or visual hallucinations.        Mood and Affect: Mood is anxious. Mood is not depressed. Affect is not labile, blunt, angry or inappropriate.        Speech: Speech normal.        Behavior: Behavior is cooperative.        Thought Content: Thought content normal. Thought content does not include homicidal or suicidal ideation. Thought content does not include homicidal or suicidal plan.        Cognition and Memory: Cognition and memory normal.        Judgment: Judgment normal.     Comments: Insight intact. No delusions.  Restless on exam (frequently repositioning in seat, fidgeting)     Lab Review:     Component Value Date/Time   NA  137 05/04/2018 1129   NA 140 10/02/2017 1700   K 4.6 05/04/2018 1129   CL 106 05/04/2018 1129   CO2 24 05/04/2018 1129   GLUCOSE 93 05/04/2018 1129   BUN 12 05/04/2018 1129   BUN 11 10/02/2017 1700   CREATININE 1.01 05/04/2018 1129   CREATININE 0.98 05/18/2017 1426   CALCIUM 10.0 05/04/2018 1129   PROT 6.9 05/04/2018 1129   ALBUMIN 4.5 05/04/2018 1129   AST 15 05/04/2018 1129   ALT 9 05/04/2018 1129   ALKPHOS 64 05/04/2018 1129   BILITOT 0.2 05/04/2018 1129   GFRNONAA 79 10/02/2017 1700   GFRNONAA 88 05/10/2017  1023   GFRAA 91 10/02/2017 1700   GFRAA 102 05/10/2017 1023       Component Value Date/Time   WBC 7.3 08/28/2017 1142   WBC 11.1 (H) 08/07/2017 1009   RBC 3.77 08/28/2017 1142   RBC 3.68 (L) 08/07/2017 1009   HGB 11.6 08/28/2017 1142   HCT 33.2 (L) 08/28/2017 1142   PLT 222 08/28/2017 1142   MCV 88 08/28/2017 1142   MCH 30.8 08/28/2017 1142   MCH 31.0 08/07/2017 1009   MCHC 34.9 08/28/2017 1142   MCHC 32.2 08/07/2017 1009   RDW 13.4 08/28/2017 1142   LYMPHSABS 1.7 08/28/2017 1142   MONOABS 0.7 08/03/2017 1428   EOSABS 0.1 08/28/2017 1142   BASOSABS 0.0 08/28/2017 1142    No results found for: POCLITH, LITHIUM   No results found for: PHENYTOIN, PHENOBARB, VALPROATE, CBMZ   .res Assessment: Plan:   Discussed potential benefits, risks, and side effects of increasing gabapentin to possibly improve anxiety and insomnia, and since pt reports that podiatrist mentioned it may also be helpful for her pain and result in being able to reduce other pain medications. Discussed increasing dose of gabapentin to 400 mg po BID and 1200 mg-1600 mg po QHS and pt agrees with this plan. She reports that she recently had 90-day supply of Gabapentin 300 mg filled and would like to use current supply prior to filling 400 mg script. Discussed that she could increase by one capsule every 5-7 days and not to exceed 8 capsules daily. Discussed that she could increase Gabapentin to 300 mg TID and 1200 mg po QHS or increase HS dose. Discussed potential benefits, risks, and side effects of increasing dose of Gabapentin.   Will continue Diazepam for anxiety.  Continue Remeron 45 mg po QHS since this has been helpful for her depression and insomnia.   Primary insomnia - Plan: gabapentin (NEURONTIN) 400 MG capsule, mirtazapine (REMERON) 45 MG tablet, diazepam (VALIUM) 5 MG tablet  Generalized anxiety disorder - Plan: gabapentin (NEURONTIN) 400 MG capsule, mirtazapine (REMERON) 45 MG tablet, diazepam (VALIUM)  5 MG tablet  Episodic mood disorder (HCC) - Plan: mirtazapine (REMERON) 45 MG tablet  Please see After Visit Summary for patient specific instructions.  Future Appointments  Date Time Provider Department Center  09/25/2018 10:45 AM Vivi BarrackWagoner, Matthew R, DPM [facility]  11/09/2018 10:45 AM Carmelia RollerWendling, Jilda RocheNicholas Paul, DO [facility]  01/10/2019  8:30 AM Corie Chiquitoarter, Shloka Baldridge, PMHNP [facility] None    No orders of the defined types were placed in this encounter.     -------------------------------

## 2018-09-18 NOTE — Telephone Encounter (Signed)
Pt called requesting refill of Oxycodone. 

## 2018-09-20 ENCOUNTER — Other Ambulatory Visit: Payer: Self-pay | Admitting: Family Medicine

## 2018-09-20 DIAGNOSIS — Z7901 Long term (current) use of anticoagulants: Secondary | ICD-10-CM

## 2018-09-20 DIAGNOSIS — Z86711 Personal history of pulmonary embolism: Secondary | ICD-10-CM

## 2018-09-25 ENCOUNTER — Encounter: Payer: PPO | Admitting: Podiatry

## 2018-09-27 NOTE — Telephone Encounter (Signed)
error 

## 2018-10-02 ENCOUNTER — Encounter: Payer: Self-pay | Admitting: Family Medicine

## 2018-10-04 ENCOUNTER — Other Ambulatory Visit: Payer: Self-pay | Admitting: Family Medicine

## 2018-10-06 ENCOUNTER — Other Ambulatory Visit: Payer: Self-pay | Admitting: Family Medicine

## 2018-10-21 ENCOUNTER — Other Ambulatory Visit: Payer: Self-pay | Admitting: Family Medicine

## 2018-10-28 ENCOUNTER — Other Ambulatory Visit: Payer: Self-pay | Admitting: Psychiatry

## 2018-10-29 ENCOUNTER — Other Ambulatory Visit: Payer: Self-pay | Admitting: Psychiatry

## 2018-10-29 ENCOUNTER — Other Ambulatory Visit: Payer: Self-pay | Admitting: Family Medicine

## 2018-10-29 DIAGNOSIS — F5101 Primary insomnia: Secondary | ICD-10-CM

## 2018-10-29 DIAGNOSIS — F411 Generalized anxiety disorder: Secondary | ICD-10-CM

## 2018-11-01 ENCOUNTER — Telehealth: Payer: Self-pay | Admitting: Psychiatry

## 2018-11-01 NOTE — Telephone Encounter (Signed)
Looks like a 90 day plus an additional refill was submitted in Feb. Will contact pharmacy to verify refills.

## 2018-11-01 NOTE — Telephone Encounter (Signed)
Brenda Dunn called to request refill on her mirtazepine 45mg .  Next appt 01/10/19.  [facility] to [facility] [address] [name]

## 2018-11-08 ENCOUNTER — Other Ambulatory Visit: Payer: Self-pay | Admitting: Family Medicine

## 2018-11-08 ENCOUNTER — Telehealth: Payer: Self-pay | Admitting: Family Medicine

## 2018-11-08 MED ORDER — ALBUTEROL SULFATE HFA 108 (90 BASE) MCG/ACT IN AERS: INHALATION_SPRAY | RESPIRATORY_TRACT | 0 refills | Status: DC

## 2018-11-08 NOTE — Telephone Encounter (Signed)
Copied from CRM [id number]. Topic: Quick Communication - Rx Refill/Question >> Nov 08, 2018 11:43 AM Maia Petties wrote: Medication: PROAIR HFA 108 (90 Base) MCG/ACT inhaler - pt states that she is out of her rescue inhaler and would like it to be sent in  Has the patient contacted their pharmacy? Yes - no refills - state they have requested twice and not received a response. Preferred Pharmacy (with phone number or street name): [phone] [place], [place] - [address] [phone] (Phone) [phone] (Fax)

## 2018-11-09 ENCOUNTER — Encounter: Payer: PPO | Admitting: Family Medicine

## 2018-11-16 ENCOUNTER — Other Ambulatory Visit: Payer: Self-pay | Admitting: Family

## 2018-11-23 ENCOUNTER — Encounter: Payer: Self-pay | Admitting: Family Medicine

## 2018-11-23 ENCOUNTER — Other Ambulatory Visit: Payer: Self-pay

## 2018-11-23 ENCOUNTER — Ambulatory Visit (INDEPENDENT_AMBULATORY_CARE_PROVIDER_SITE_OTHER): Payer: PPO | Admitting: Family Medicine

## 2018-11-23 DIAGNOSIS — J449 Chronic obstructive pulmonary disease, unspecified: Secondary | ICD-10-CM | POA: Diagnosis not present

## 2018-11-23 MED ORDER — METHYLPREDNISOLONE 4 MG PO TBPK: ORAL_TABLET | ORAL | 0 refills | Status: DC

## 2018-11-23 NOTE — Progress Notes (Signed)
Chief Complaint  Patient presents with  . Cough    [name] here for nocturnal cough.  Due to outbreak, we are interacting via telephone. We tried for web portal for an electronic face-to-face visit. I verified patient's ID using 2 identifiers.   Duration: 1.5 weeks  Associated symptoms: wheezing and chest tightness Denies: sinus congestion, sinus pain, rhinorrhea, itchy watery eyes, ear pain, ear drainage, sore throat, shortness of breath, myalgia and fevers Treatment to date: SABA, LABA/ICS, Xyzal, Singulair Sick contacts: No  ROS:  Const: Denies fevers Lungs: +cough  Past Medical History:  Diagnosis Date  . Anxiety   . Benzodiazepine dependence (HCC)   . Colonic polyp    mulitple polyps. repeat in 05/2019  . Constipation    IBS  . COPD (chronic obstructive pulmonary disease) (HCC)   . Depression   . GERD (gastroesophageal reflux disease)   . Hyperlipidemia   . Hypertension   . Insomnia   . Migraine headache   . OAB (overactive bladder)   . Osteoarthritis   . Osteoporosis 12/19/2017  . Post-surgical hypothyroidism   . Pulmonary embolism (HCC)    Eliquis to stop 05/03/19  . Scoliosis    Exam: No conversational dyspnea Age appropriate judgment and insight Nml affect and mood  COPD (chronic obstructive pulmonary disease) with chronic bronchitis (HCC) - Plan: methylPREDNISolone (MEDROL DOSEPAK) 4 MG TBPK tablet  Given her hx with COPD and wheezing, will treat with above. Prefers lower dose steroid. Continue to push fluids, practice good hand hygiene, cover mouth when coughing. F/u in 1 week if no better, would like to be in person so I can examine her. If starting to experience fevers, shaking, or shortness of breath, seek immediate care. Pt voiced understanding and agreement to the plan.  Jilda Roche [place], DO 11/23/18 1:15 PM

## 2018-11-30 ENCOUNTER — Encounter: Payer: Self-pay | Admitting: Family Medicine

## 2018-11-30 ENCOUNTER — Ambulatory Visit (INDEPENDENT_AMBULATORY_CARE_PROVIDER_SITE_OTHER): Payer: PPO | Admitting: Family Medicine

## 2018-11-30 ENCOUNTER — Other Ambulatory Visit: Payer: Self-pay

## 2018-11-30 ENCOUNTER — Telehealth: Payer: Self-pay

## 2018-11-30 DIAGNOSIS — J302 Other seasonal allergic rhinitis: Secondary | ICD-10-CM

## 2018-11-30 NOTE — Telephone Encounter (Signed)
Copied from CRM [id number]. Topic: Appointment Scheduling - Scheduling Inquiry for Clinic >> Nov 30, 2018  7:21 AM Leafy Ro wrote: Reason for CRM: pt is calling and can not do virtual visit and would like dr wendling to call her today. Pt is following up on her allergies

## 2018-11-30 NOTE — Progress Notes (Signed)
Virtual Visit via Video Note  I connected with Brenda Dunn on 11/08/18 at  1:15 PM EDT by a telephone and verified that I am speaking with the correct person using two identifiers.   I discussed the limitations of evaluation and management by telemedicine and the availability of in person appointments. The patient expressed understanding and agreed to proceed.  History of Present Illness: Pt dx'd with allergies and COPD exacerbation on 11/23/2018. Started on Medrol Dosepak and reports near resolution. Feels much better. No coughing or allergy s/s's. She is compliant with Xyzal and Singulair. Reports no AE's. Cannot do nasal sprays.    Observations/Objective: No conversational dyspnea Age appropriate judgment and insight Nml affect and mood  Assessment and Plan: Seasonal allergies  Cont meds. Can take extra dose of Xyzal on bad days. If continuing to have freq flares of allergies, may need to refer to Allergy team.   Follow Up Instructions: prn   I discussed the assessment and treatment plan with the patient. The patient was provided an opportunity to ask questions and all were answered. The patient agreed with the plan and demonstrated an understanding of the instructions.  We spent 12 minutes together on the phone.    The patient was advised to call back or seek an in-person evaluation if the symptoms worsen or if the condition fails to improve as anticipated.   Jilda Roche [place], DO

## 2018-11-30 NOTE — Telephone Encounter (Signed)
Scheduled telephone call today 5/1

## 2018-12-06 ENCOUNTER — Other Ambulatory Visit: Payer: Self-pay | Admitting: Family Medicine

## 2018-12-25 ENCOUNTER — Telehealth: Payer: Self-pay | Admitting: Family Medicine

## 2018-12-25 NOTE — Telephone Encounter (Signed)
Copied from CRM [id number]. Topic: General - Inquiry >> Dec 25, 2018  8:48 AM Lynne Logan D wrote: Reason for CRM: Pt called to follow up on paperwork for handicap placard that she mailed in some weeks ago. She would like to know the status of the paperwork. Please advise. [phone]  Called the patient informed we have not received it,,, she will get another and bring by the office to have completed.

## 2018-12-31 ENCOUNTER — Telehealth: Payer: Self-pay | Admitting: Family Medicine

## 2018-12-31 DIAGNOSIS — J302 Other seasonal allergic rhinitis: Secondary | ICD-10-CM

## 2018-12-31 MED ORDER — MONTELUKAST SODIUM 10 MG PO TABS: 10.0000 mg | ORAL_TABLET | Freq: Every day | ORAL | 1 refills | Status: DC

## 2018-12-31 MED ORDER — LEVOCETIRIZINE DIHYDROCHLORIDE 5 MG PO TABS: ORAL_TABLET | ORAL | 3 refills | Status: DC

## 2018-12-31 MED ORDER — BUDESONIDE-FORMOTEROL FUMARATE 80-4.5 MCG/ACT IN AERO: INHALATION_SPRAY | RESPIRATORY_TRACT | 3 refills | Status: DC

## 2018-12-31 MED ORDER — ALBUTEROL SULFATE HFA 108 (90 BASE) MCG/ACT IN AERS: INHALATION_SPRAY | RESPIRATORY_TRACT | 1 refills | Status: DC

## 2018-12-31 NOTE — Telephone Encounter (Signed)
Refills sent

## 2019-01-02 ENCOUNTER — Telehealth: Payer: Self-pay | Admitting: Family Medicine

## 2019-01-02 NOTE — Telephone Encounter (Signed)
PT dropped off handicapp forms and wants it mailed back to her before [phone]

## 2019-01-04 ENCOUNTER — Other Ambulatory Visit: Payer: Self-pay | Admitting: Family Medicine

## 2019-01-04 DIAGNOSIS — Z86711 Personal history of pulmonary embolism: Secondary | ICD-10-CM

## 2019-01-04 DIAGNOSIS — Z7901 Long term (current) use of anticoagulants: Secondary | ICD-10-CM

## 2019-01-08 ENCOUNTER — Ambulatory Visit: Payer: Self-pay | Admitting: Family Medicine

## 2019-01-08 ENCOUNTER — Encounter: Payer: Self-pay | Admitting: Family Medicine

## 2019-01-08 ENCOUNTER — Other Ambulatory Visit: Payer: Self-pay

## 2019-01-08 ENCOUNTER — Ambulatory Visit (INDEPENDENT_AMBULATORY_CARE_PROVIDER_SITE_OTHER): Payer: PPO | Admitting: Family Medicine

## 2019-01-08 ENCOUNTER — Telehealth: Payer: Self-pay

## 2019-01-08 DIAGNOSIS — J441 Chronic obstructive pulmonary disease with (acute) exacerbation: Secondary | ICD-10-CM

## 2019-01-08 MED ORDER — PROMETHAZINE HCL 6.25 MG/5ML PO SYRP: 6.2500 mg | ORAL_SOLUTION | Freq: Three times a day (TID) | ORAL | 0 refills | Status: DC | PRN

## 2019-01-08 MED ORDER — PREDNISONE 20 MG PO TABS: 40.0000 mg | ORAL_TABLET | Freq: Every day | ORAL | 0 refills | Status: DC

## 2019-01-08 MED ORDER — AZITHROMYCIN 250 MG PO TABS: ORAL_TABLET | ORAL | 0 refills | Status: DC

## 2019-01-08 MED ORDER — TIOTROPIUM BROMIDE MONOHYDRATE 18 MCG IN CAPS: 18.0000 ug | ORAL_CAPSULE | Freq: Every day | RESPIRATORY_TRACT | 12 refills | Status: DC

## 2019-01-08 NOTE — Telephone Encounter (Signed)
Copied from [place] [id number]. Topic: Appointment Scheduling - Scheduling Inquiry for Clinic >> Jan 08, 2019  7:45 AM Lennox Solders wrote: Reason for CRM: pt has copd and she is having fatigue and voice is coming and going. Pt was on steroid. Pt is feeling the same way she felt in 2018 dx with PNA.

## 2019-01-08 NOTE — Telephone Encounter (Signed)
Virtual visit scheduled.  

## 2019-01-08 NOTE — Progress Notes (Signed)
Chief Complaint  Patient presents with  . Shortness of Breath    patient states feels bad today    Brenda Dunn here for URI complaints. Due to COVID-19 pandemic, we are interacting via web portal for an electronic face-to-face visit. I verified patient's ID using 2 identifiers. Patient agreed to proceed with visit via this method. Patient is at home, I am at home. Patient, her husband Richardson Landry, and I are present for visit.    Duration: 1 week  Associated symptoms: subjective fever, rhinorrhea, wheezing, shortness of breath, chest tightness and productive cough Denies: sinus congestion, sinus pain, itchy watery eyes, ear pain, ear drainage, sore throat, chest pain and myalgia Treatment to date: Neb tx Sick contacts: No  ROS:  Const: +subj fevers HEENT: As noted in HPI Lungs: + SOB  Past Medical History:  Diagnosis Date  . Anxiety   . Benzodiazepine dependence ([place])   . Colonic polyp    mulitple polyps. repeat in 05/2019  . Constipation    IBS  . COPD (chronic obstructive pulmonary disease) ([place])   . Depression   . GERD (gastroesophageal reflux disease)   . Hyperlipidemia   . Hypertension   . Insomnia   . Migraine headache   . OAB (overactive bladder)   . Osteoarthritis   . Osteoporosis 12/19/2017  . Post-surgical hypothyroidism   . Pulmonary embolism ([place])    Eliquis to stop 05/03/19  . Scoliosis    Exam No conversational dyspnea Age appropriate judgment and insight Nml affect and mood  COPD exacerbation (HCC) - Plan: predniSONE (DELTASONE) 20 MG tablet, azithromycin (ZITHROMAX) 250 MG tablet, promethazine (PHENERGAN) 6.25 MG/5ML syrup  Orders as above. Start LAMA. Continue to push fluids, practice good hand hygiene, cover mouth when coughing. F/u prn. If starting to experience fevers, shaking, or worsening shortness of breath, seek immediate care. Pt voiced understanding and agreement to the plan.  [place], DO 01/08/19 10:31 AM

## 2019-01-08 NOTE — Telephone Encounter (Signed)
Scheduled for today.

## 2019-01-08 NOTE — Telephone Encounter (Signed)
Pt. Reports she started feeling bad over the weekend after working outside. Has increased shortness of breath, wheezing. Has been using her rescue inhaler more. Chest feels tight. Warm transfer to [facility] in the practice for a virtual visit. Answer Assessment - Initial Assessment Questions 1. COVID-19 DIAGNOSIS: "Who made your Coronavirus (COVID-19) diagnosis?" "Was it confirmed by a positive lab test?" If not diagnosed by a HCP, ask "Are there lots of cases (community spread) where you live?" (See public health department website, if unsure)     No test 2. ONSET: "When did the COVID-19 symptoms start?"      Started Saturday 3. WORST SYMPTOM: "What is your worst symptom?" (e.g., cough, fever, shortness of breath, muscle aches)     Cough, shortness of breath 4. COUGH: "Do you have a cough?" If so, ask: "How bad is the cough?"       Yes. Moderate. 5. FEVER: "Do you have a fever?" If so, ask: "What is your temperature, how was it measured, and when did it start?"     Unsure 6. RESPIRATORY STATUS: "Describe your breathing?" (e.g., shortness of breath, wheezing, unable to speak)      Shortness of breath and wheezing 7. BETTER-SAME-WORSE: "Are you getting better, staying the same or getting worse compared to yesterday?"  If getting worse, ask, "In what way?"     Worse 8. HIGH RISK DISEASE: "Do you have any chronic medical problems?" (e.g., asthma, heart or lung disease, weak immune system, etc.)     COPD 9. PREGNANCY: "Is there any chance you are pregnant?" "When was your last menstrual period?"     No 10. OTHER SYMPTOMS: "Do you have any other symptoms?"  (e.g., chills, fatigue, headache, loss of smell or taste, muscle pain, sore throat)       Fatigue, headache  Protocols used: CORONAVIRUS (COVID-19) DIAGNOSED OR SUSPECTED-A-AH

## 2019-01-10 ENCOUNTER — Ambulatory Visit (INDEPENDENT_AMBULATORY_CARE_PROVIDER_SITE_OTHER): Payer: PPO | Admitting: Psychiatry

## 2019-01-10 ENCOUNTER — Other Ambulatory Visit: Payer: Self-pay

## 2019-01-10 ENCOUNTER — Telehealth: Payer: Self-pay

## 2019-01-10 ENCOUNTER — Encounter: Payer: Self-pay | Admitting: Psychiatry

## 2019-01-10 DIAGNOSIS — F5101 Primary insomnia: Secondary | ICD-10-CM | POA: Diagnosis not present

## 2019-01-10 DIAGNOSIS — F39 Unspecified mood [affective] disorder: Secondary | ICD-10-CM | POA: Diagnosis not present

## 2019-01-10 DIAGNOSIS — F411 Generalized anxiety disorder: Secondary | ICD-10-CM

## 2019-01-10 MED ORDER — GABAPENTIN 400 MG PO CAPS: ORAL_CAPSULE | ORAL | 1 refills | Status: DC

## 2019-01-10 MED ORDER — DIAZEPAM 5 MG PO TABS: ORAL_TABLET | ORAL | 4 refills | Status: DC

## 2019-01-10 MED ORDER — MIRTAZAPINE 45 MG PO TABS: 45.0000 mg | ORAL_TABLET | Freq: Every day | ORAL | 1 refills | Status: DC

## 2019-01-10 NOTE — Progress Notes (Signed)
Brenda Dunn [id number] 05/15/1951 68 y.o.  Virtual Visit via Telephone Note  I connected with pt on 01/10/19 at  8:30 AM EDT by telephone and verified that I am speaking with the correct person using two identifiers.   I discussed the limitations, risks, security and privacy concerns of performing an evaluation and management service by telephone and the availability of in person appointments. I also discussed with the patient that there may be a patient responsible charge related to this service. The patient expressed understanding and agreed to proceed.   I discussed the assessment and treatment plan with the patient. The patient was provided an opportunity to ask questions and all were answered. The patient agreed with the plan and demonstrated an understanding of the instructions.   The patient was advised to call back or seek an in-person evaluation if the symptoms worsen or if the condition fails to improve as anticipated.  I provided 30 minutes of non-face-to-face time during this encounter.  The patient was located at home.  [facility]   Corie ChiquitoJessica Shelvia Fojtik, PMHNP   Subjective:   Patient ID:  Brenda Dunn is a 68 y.o. (DOB 03/10/1951) female.  Chief Complaint:  Chief Complaint  Patient presents with  . Anxiety    HPI Brenda Dunn presents for follow-up of anxiety, depression and insomnia. She reports that her sleep quality has improved since Gabapentin was increased at last visit. Reports sleep amount varies from 4-6 hours a night. She reports that her anxiety is "up and down as normal." She denies any recent panic attacks. Granddaughter (deceased son's dau) is 528 yo. Reports that biological mother did not want pt's granddaughter. Pt reports that granddaughter frequently asks/demands financial help from them and is not always truthful with them. Pt reports that she and her husband are conflicted about whether to help her or not at times. Denies  depressed mood. She reports that her energy and motivation have been ok. Denies SI.    Review of Systems:  Review of Systems  HENT: Positive for congestion and voice change.   Respiratory: Positive for shortness of breath.   Musculoskeletal: Negative for gait problem.  Allergic/Immunologic: Positive for environmental allergies.       She reports recent flare of allergy and respiratory s/s after mowing the grass with a push mower on a day with significant heat and humidity.   Psychiatric/Behavioral:       Please refer to HPI    Medications: I have reviewed the patient's current medications.  Current Outpatient Medications  Medication Sig Dispense Refill  . albuterol (VENTOLIN HFA) 108 (90 Base) MCG/ACT inhaler INHALE 2 PUFFS BY MOUTH EVERY 4 HOURS AS NEEDED FOR WHEEZING OR SHORTNESS OF BREATH 1 Inhaler 1  . alendronate (FOSAMAX) 70 MG tablet TAKE 1 TABLET EVERY WEEK WITH FULL GLASS OF WATER ON EMPTY STOMACH 12 tablet 3  . atorvastatin (LIPITOR) 40 MG tablet TAKE 1 TABLET BY MOUTH EVERYDAY AT BEDTIME 90 tablet 0  . azithromycin (ZITHROMAX) 250 MG tablet Take 2 tabs the first day and then 1 tab daily until you run out. 6 tablet 0  . budesonide-formoterol (SYMBICORT) 80-4.5 MCG/ACT inhaler TAKE 2 PUFFS BY MOUTH TWICE A DAY 3 Inhaler 3  . Calcium Carbonate-Vitamin D (CALTRATE 600+D) 600-400 MG-UNIT tablet Take 1 tablet by mouth 2 (two) times daily. 60 tablet 11  . diazepam (VALIUM) 5 MG tablet Take 1 tablet in the am, 1/2 tablet at lunch and 1  tablet at night 75 tablet 4  . diclofenac sodium (VOLTAREN) 1 % GEL Apply 2 g topically 4 (four) times daily. Rub into affected area of foot 2 to 4 times daily 100 g 2  . Docusate Calcium (STOOL SOFTENER PO) Take 3 tablets by mouth daily.    Marland Kitchen. ELIQUIS 5 MG TABS tablet TAKE 1 TABLET BY MOUTH TWICE A DAY 60 tablet 2  . gabapentin (NEURONTIN) 400 MG capsule TAKE 1 CAPSULE TWICE DAILY  and 4 CAPSULES AT BEDTIME 540 capsule 1  . levocetirizine (XYZAL) 5 MG  tablet TAKE 1 TABLET BY MOUTH EVERY DAY IN THE EVENING 30 tablet 3  . levothyroxine (SYNTHROID, LEVOTHROID) 88 MCG tablet Take 1 tablet (88 mcg total) by mouth daily. 30 tablet 3  . LINZESS 290 MCG CAPS capsule TAKE 1 CAPSULE BY MOUTH DAILY ON EMPTY STOMACH AT LEAST 30 MINUTES PRIOR TO 1ST MEAL OF THE DAY 90 capsule 1  . lisinopril (ZESTRIL) 40 MG tablet TAKE 1 TABLET BY MOUTH EVERY DAY 90 tablet 1  . mirtazapine (REMERON) 45 MG tablet Take 1 tablet (45 mg total) by mouth at bedtime. 90 tablet 1  . montelukast (SINGULAIR) 10 MG tablet Take 1 tablet (10 mg total) by mouth at bedtime. 90 tablet 1  . MYRBETRIQ 50 MG TB24 tablet TAKE 1 TABLET BY MOUTH EVERY DAY 90 tablet 1  . omeprazole (PRILOSEC) 40 MG capsule Take 1 capsule (40 mg total) by mouth daily. 90 capsule 3  . predniSONE (DELTASONE) 20 MG tablet Take 2 tablets (40 mg total) by mouth daily with breakfast for 5 days. 10 tablet 0  . Probiotic Product (PROBIOTIC DAILY PO) Take 2 tablets by mouth daily.    . promethazine (PHENERGAN) 6.25 MG/5ML syrup Take 5 mLs (6.25 mg total) by mouth every 8 (eight) hours as needed for nausea. 118 mL 0  . tiotropium (SPIRIVA) 18 MCG inhalation capsule Place 1 capsule (18 mcg total) into inhaler and inhale daily. 30 capsule 12   No current facility-administered medications for this visit.     Medication Side Effects: None  Allergies: No Known Allergies  Past Medical History:  Diagnosis Date  . Anxiety   . Benzodiazepine dependence (HCC)   . Colonic polyp    mulitple polyps. repeat in 05/2019  . Constipation    IBS  . COPD (chronic obstructive pulmonary disease) (HCC)   . Depression   . GERD (gastroesophageal reflux disease)   . Hyperlipidemia   . Hypertension   . Insomnia   . Migraine headache   . OAB (overactive bladder)   . Osteoarthritis   . Osteoporosis 12/19/2017  . Post-surgical hypothyroidism   . Pulmonary embolism (HCC)    Eliquis to stop 05/03/19  . Scoliosis     Family History   Problem Relation Age of Onset  . Breast cancer Mother   . Arthritis Mother   . Hearing loss Mother   . Hyperlipidemia Mother   . Hypertension Mother   . Anxiety disorder Mother   . Lung cancer Maternal Uncle   . Anxiety disorder Maternal Uncle   . Lung cancer Maternal Grandmother   . Heart attack Maternal Grandmother   . Anxiety disorder Maternal Grandmother   . Asthma Father   . COPD Father   . Hyperlipidemia Father   . Hypertension Father   . Alcohol abuse Father   . Anxiety disorder Sister   . Hypertension Sister   . Thyroid cancer Sister   . Anxiety disorder Brother   .  Hypertension Brother   . Drug abuse Son   . Anxiety disorder Sister   . Hypertension Sister   . Anxiety disorder Sister   . Hypertension Sister   . Hyperlipidemia Son     Social History   Socioeconomic History  . Marital status: Married    Spouse name: Not on file  . Number of children: Not on file  . Years of education: Not on file  . Highest education level: Not on file  Occupational History  . Occupation: disabled due "my nerves"  Social Needs  . Financial resource strain: Not on file  . Food insecurity    Worry: Not on file    Inability: Not on file  . Transportation needs    Medical: Not on file    Non-medical: Not on file  Tobacco Use  . Smoking status: Current Every Day Smoker    Packs/day: 0.50    Years: 44.00    Pack years: 22.00    Types: Cigarettes  . Smokeless tobacco: Never Used  Substance and Sexual Activity  . Alcohol use: No  . Drug use: No  . Sexual activity: Not on file  Lifestyle  . Physical activity    Days per week: Not on file    Minutes per session: Not on file  . Stress: Not on file  Relationships  . Social Musicianconnections    Talks on phone: Not on file    Gets together: Not on file    Attends religious service: Not on file    Active member of club or organization: Not on file    Attends meetings of clubs or organizations: Not on file    Relationship  status: Not on file  . Intimate partner violence    Fear of current or ex partner: Not on file    Emotionally abused: Not on file    Physically abused: Not on file    Forced sexual activity: Not on file  Other Topics Concern  . Not on file  Social History Narrative  . Not on file    Past Medical History, Surgical history, Social history, and Family history were reviewed and updated as appropriate.   Please see review of systems for further details on the patient's review from today.   Objective:   Physical Exam:  There were no vitals taken for this visit.  Physical Exam Neurological:     Mental Status: She is alert and oriented to person, place, and time.     Cranial Nerves: No dysarthria.  Psychiatric:        Attention and Perception: Attention normal.        Mood and Affect: Mood is anxious.        Speech: Speech normal.        Behavior: Behavior is cooperative.        Thought Content: Thought content normal. Thought content is not paranoid or delusional. Thought content does not include homicidal or suicidal ideation. Thought content does not include homicidal or suicidal plan.        Cognition and Memory: Cognition and memory normal.        Judgment: Judgment normal.     Lab Review:     Component Value Date/Time   NA 137 05/04/2018 1129   NA 140 10/02/2017 1700   K 4.6 05/04/2018 1129   CL 106 05/04/2018 1129   CO2 24 05/04/2018 1129   GLUCOSE 93 05/04/2018 1129   BUN 12 05/04/2018 1129   BUN  11 10/02/2017 1700   CREATININE 1.01 05/04/2018 1129   CREATININE 0.98 05/18/2017 1426   CALCIUM 10.0 05/04/2018 1129   PROT 6.9 05/04/2018 1129   ALBUMIN 4.5 05/04/2018 1129   AST 15 05/04/2018 1129   ALT 9 05/04/2018 1129   ALKPHOS 64 05/04/2018 1129   BILITOT 0.2 05/04/2018 1129   GFRNONAA 79 10/02/2017 1700   GFRNONAA 88 05/10/2017 1023   GFRAA 91 10/02/2017 1700   GFRAA 102 05/10/2017 1023       Component Value Date/Time   WBC 7.3 08/28/2017 1142   WBC  11.1 (H) 08/07/2017 1009   RBC 3.77 08/28/2017 1142   RBC 3.68 (L) 08/07/2017 1009   HGB 11.6 08/28/2017 1142   HCT 33.2 (L) 08/28/2017 1142   PLT 222 08/28/2017 1142   MCV 88 08/28/2017 1142   MCH 30.8 08/28/2017 1142   MCH 31.0 08/07/2017 1009   MCHC 34.9 08/28/2017 1142   MCHC 32.2 08/07/2017 1009   RDW 13.4 08/28/2017 1142   LYMPHSABS 1.7 08/28/2017 1142   MONOABS 0.7 08/03/2017 1428   EOSABS 0.1 08/28/2017 1142   BASOSABS 0.0 08/28/2017 1142    No results found for: POCLITH, LITHIUM   No results found for: PHENYTOIN, PHENOBARB, VALPROATE, CBMZ   .res Assessment: Plan:     Encounter Diagnoses  Name Primary?  Marland Kitchen GAD (generalized anxiety disorder) Yes  . Primary insomnia   . Generalized anxiety disorder   . Episodic mood disorder (HCC)    Maryalyce was seen today for anxiety.  Diagnoses and all orders for this visit:  GAD (generalized anxiety disorder)  Primary insomnia -     diazepam (VALIUM) 5 MG tablet; Take 1 tablet in the am, 1/2 tablet at lunch and 1 tablet at night -     mirtazapine (REMERON) 45 MG tablet; Take 1 tablet (45 mg total) by mouth at bedtime. -     gabapentin (NEURONTIN) 400 MG capsule; TAKE 1 CAPSULE TWICE DAILY  and 4 CAPSULES AT BEDTIME  Generalized anxiety disorder -     diazepam (VALIUM) 5 MG tablet; Take 1 tablet in the am, 1/2 tablet at lunch and 1 tablet at night -     mirtazapine (REMERON) 45 MG tablet; Take 1 tablet (45 mg total) by mouth at bedtime. -     gabapentin (NEURONTIN) 400 MG capsule; TAKE 1 CAPSULE TWICE DAILY  and 4 CAPSULES AT BEDTIME  Episodic mood disorder (HCC) -     mirtazapine (REMERON) 45 MG tablet; Take 1 tablet (45 mg total) by mouth at bedtime.   Please see After Visit Summary for patient specific instructions.  Future Appointments  Date Time Provider [place]  06/10/2019 11:30 AM Thayer Headings, PMHNP [facility] None    No orders of the defined types were placed in this encounter.      -------------------------------

## 2019-01-10 NOTE — Telephone Encounter (Signed)
Take both. Ty.

## 2019-01-10 NOTE — Telephone Encounter (Signed)
Copied from [place] [id number]. Topic: General - Inquiry >> Jan 09, 2019  3:47 PM Richardo Priest, NT wrote: Reason for CRM: Patient is calling in asking for some clarification on if she needs to take the symbicort as well as the spiriva or just take the spiriva. Please advise and call back is [phone].

## 2019-01-10 NOTE — Telephone Encounter (Signed)
Please advise 

## 2019-01-10 NOTE — Telephone Encounter (Signed)
Called the patient and she does not want to take the spiriva (will take for a couple more days), but she is doing much better today. she said she will become dependent (Spiriva) on it then will need a nebulizer..she wants only to take Symbicort.

## 2019-01-23 ENCOUNTER — Telehealth: Payer: Self-pay | Admitting: Psychiatry

## 2019-01-23 ENCOUNTER — Telehealth: Payer: Self-pay

## 2019-01-23 ENCOUNTER — Other Ambulatory Visit: Payer: Self-pay | Admitting: Psychiatry

## 2019-01-23 DIAGNOSIS — F5101 Primary insomnia: Secondary | ICD-10-CM

## 2019-01-23 DIAGNOSIS — F411 Generalized anxiety disorder: Secondary | ICD-10-CM

## 2019-01-23 NOTE — Telephone Encounter (Signed)
Copied from [place] [id number]. Topic: General - Other >> Jan 23, 2019 10:47 AM Alanda Slim E wrote: Reason for CRM: Pt needs a letter stating that she has unlined conditions that prevents her from wearing a mask. She has been approach several times with conflict about not wearing a mask In public. Pt will try to stay home to avoid conflict until she hears from office about the letter/ please advise

## 2019-01-23 NOTE — Telephone Encounter (Signed)
Pt called to request letter stating,due to anxiety unable to wear mask. Call pt to pick up when complete

## 2019-01-24 NOTE — Telephone Encounter (Signed)
Informed the patient of PCP instructions. She verbalized understanding. 

## 2019-01-24 NOTE — Telephone Encounter (Signed)
Called but mailbox full///no answer.

## 2019-01-24 NOTE — Telephone Encounter (Signed)
All face masks or certain materials?

## 2019-01-24 NOTE — Telephone Encounter (Signed)
I'm not sure I have the answer. She might need to take a hit of her rescue inhaler prior to putting on the mask.

## 2019-01-24 NOTE — Telephone Encounter (Signed)
Given pt information but she doesn't seem interested in that option. She said she will do without.

## 2019-01-24 NOTE — Telephone Encounter (Signed)
She said the type we have in our office are the ones she prefers, But any type material is difficult for her to wear because of her breathing problems She said out in public and has difficulty breathing in them outside. I did not get a clear definitive answer only that she does not want to wear one.Marland KitchenMarland Kitchen

## 2019-01-30 ENCOUNTER — Other Ambulatory Visit: Payer: Self-pay | Admitting: Family Medicine

## 2019-01-31 DIAGNOSIS — H524 Presbyopia: Secondary | ICD-10-CM | POA: Diagnosis not present

## 2019-01-31 DIAGNOSIS — H02831 Dermatochalasis of right upper eyelid: Secondary | ICD-10-CM | POA: Diagnosis not present

## 2019-01-31 DIAGNOSIS — H5203 Hypermetropia, bilateral: Secondary | ICD-10-CM | POA: Diagnosis not present

## 2019-01-31 DIAGNOSIS — H02834 Dermatochalasis of left upper eyelid: Secondary | ICD-10-CM | POA: Diagnosis not present

## 2019-01-31 DIAGNOSIS — H2513 Age-related nuclear cataract, bilateral: Secondary | ICD-10-CM | POA: Diagnosis not present

## 2019-02-01 ENCOUNTER — Other Ambulatory Visit: Payer: Self-pay | Admitting: Psychiatry

## 2019-02-01 DIAGNOSIS — F5101 Primary insomnia: Secondary | ICD-10-CM

## 2019-02-01 DIAGNOSIS — F411 Generalized anxiety disorder: Secondary | ICD-10-CM

## 2019-02-28 ENCOUNTER — Encounter (HOSPITAL_BASED_OUTPATIENT_CLINIC_OR_DEPARTMENT_OTHER): Payer: Self-pay | Admitting: Emergency Medicine

## 2019-02-28 ENCOUNTER — Emergency Department (HOSPITAL_BASED_OUTPATIENT_CLINIC_OR_DEPARTMENT_OTHER): Payer: PPO

## 2019-02-28 ENCOUNTER — Emergency Department (HOSPITAL_BASED_OUTPATIENT_CLINIC_OR_DEPARTMENT_OTHER)
Admission: EM | Admit: 2019-02-28 | Discharge: 2019-02-28 | Disposition: A | Discharge status: Home / Self Care | Payer: PPO | Attending: Emergency Medicine | Admitting: Emergency Medicine

## 2019-02-28 ENCOUNTER — Other Ambulatory Visit: Payer: Self-pay

## 2019-02-28 DIAGNOSIS — Z79899 Other long term (current) drug therapy: Secondary | ICD-10-CM | POA: Insufficient documentation

## 2019-02-28 DIAGNOSIS — F17210 Nicotine dependence, cigarettes, uncomplicated: Secondary | ICD-10-CM | POA: Diagnosis not present

## 2019-02-28 DIAGNOSIS — Z7901 Long term (current) use of anticoagulants: Secondary | ICD-10-CM | POA: Insufficient documentation

## 2019-02-28 DIAGNOSIS — I10 Essential (primary) hypertension: Secondary | ICD-10-CM | POA: Insufficient documentation

## 2019-02-28 DIAGNOSIS — M79604 Pain in right leg: Secondary | ICD-10-CM

## 2019-02-28 DIAGNOSIS — J449 Chronic obstructive pulmonary disease, unspecified: Secondary | ICD-10-CM | POA: Diagnosis not present

## 2019-02-28 DIAGNOSIS — Z86711 Personal history of pulmonary embolism: Secondary | ICD-10-CM | POA: Insufficient documentation

## 2019-02-28 DIAGNOSIS — M79661 Pain in right lower leg: Secondary | ICD-10-CM | POA: Diagnosis not present

## 2019-02-28 DIAGNOSIS — M1711 Unilateral primary osteoarthritis, right knee: Secondary | ICD-10-CM | POA: Diagnosis not present

## 2019-02-28 MED ORDER — ACETAMINOPHEN 325 MG PO TABS
650.0000 mg | ORAL_TABLET | Freq: Once | ORAL | Status: AC
  Administered 2019-02-28: 09:00:00 650 mg via ORAL
  Filled 2019-02-28: qty 2

## 2019-02-28 NOTE — ED Notes (Signed)
Pt verbalized understanding of dc instructions.

## 2019-02-28 NOTE — ED Notes (Signed)
Pt was holding the mask to her mouth coughing I advised pt to put the mask on approprietly to protect herself and others per pt she could not wear the mask approprietly " I can not breath with it on "   Called for Respiratory to come and listen to the Patent,

## 2019-02-28 NOTE — ED Triage Notes (Signed)
Bilateral foot pain and R leg pain for "awhile"

## 2019-02-28 NOTE — ED Provider Notes (Signed)
[facility] [place] [facility] EMERGENCY DEPARTMENT Provider Note   CSN: [id number] Arrival date & time: 02/28/19  0805    History   Chief Complaint Chief Complaint  Patient presents with  . Leg Pain    HPI Brenda Dunn is a 68 y.o. female.     HPI  68 year old female with right calf/leg pain.  The patient states that in December she had surgery on her left foot.  She is been favoring the right foot since.  She is been more active since spring and for the last several months has had numbness in her foot when walking and pain in her foot/lower leg with walking.  This morning she felt like her right lateral ankle was a little swollen and was having the pain in her leg and was worried about a blood clot.  She is on Eliquis for prior PE.  She reports compliance.  No chest pain or shortness of breath say from her chronic COPD and nothing is new or worse.  Past Medical History:  Diagnosis Date  . Anxiety   . Benzodiazepine dependence ([place])   . Colonic polyp    mulitple polyps. repeat in 05/2019  . Constipation    IBS  . COPD (chronic obstructive pulmonary disease) ([place])   . Depression   . GERD (gastroesophageal reflux disease)   . Hyperlipidemia   . Hypertension   . Insomnia   . Migraine headache   . OAB (overactive bladder)   . Osteoarthritis   . Osteoporosis 12/19/2017  . Post-surgical hypothyroidism   . Pulmonary embolism ([place])    Eliquis to stop 05/03/19  . Scoliosis     Patient Active Problem List   Diagnosis Date Noted  . Chronic throat clearing 06/18/2018  . Bunion 05/04/2018  . Severe mood disorder without psychotic features ([place]) 04/16/2018  . Osteoporosis 12/19/2017  . Seasonal allergies 11/24/2017  . Solitary pulmonary nodule on lung CT 06/02/2017  . Tobacco abuse 05/02/2017  . Essential hypertension   . GERD (gastroesophageal reflux disease)   . Post-surgical hypothyroidism   . Hyperlipidemia   . Osteoarthritis   . Depression   . Constipation   . Scoliosis    . Migraine headache   . Insomnia   . OAB (overactive bladder)   . Colonic polyp   . History of colonic polyps 02/06/2017  . COPD (chronic obstructive pulmonary disease) with chronic bronchitis ([place]) 12/07/2016  . GAD (generalized anxiety disorder) 12/07/2016  . RLS (restless legs syndrome) 12/07/2016    Past Surgical History:  Procedure Laterality Date  . BREAST CYST ASPIRATION Left   . HEMORRHOID SURGERY    . TH[facility]ROIDECTOMY     at [facility]  . TUBAL LIGATION  1979     OB History   No obstetric history on file.      Home Medications    Prior to Admission medications   Medication Sig Start Date End Date Taking? Authorizing Provider  albuterol (VENTOLIN HFA) 108 (90 Base) MCG/ACT inhaler INHALE 2 PUFFS BY MOUTH EVERY 4 HOURS AS NEEDED FOR WHEEZING OR SHORTNESS OF BREATH 12/31/18   [name], Crosby Oyster, DO  alendronate (FOSAMAX) 70 MG tablet TAKE 1 TABLET EVERY WEEK WITH FULL GLASS OF WATER ON EMPTY STOMACH 11/16/18   Shelda Pal, DO  atorvastatin (LIPITOR) 40 MG tablet TAKE 1 TABLET BY MOUTH EVERYDAY AT BEDTIME 01/30/19   [name], Crosby Oyster, DO  azithromycin (ZITHROMAX) 250 MG tablet Take 2 tabs the first day and then 1 tab daily until  you run out. 01/08/19   Sharlene DoryWendling, [name] Paul, DO  budesonide-formoterol (SYMBICORT) 80-4.5 MCG/ACT inhaler TAKE 2 PUFFS BY MOUTH TWICE A DAY 12/31/18   [name], Jilda RocheNicholas Paul, DO  Calcium Carbonate-Vitamin D (CALTRATE 600+D) 600-400 MG-UNIT tablet Take 1 tablet by mouth 2 (two) times daily. 12/19/17   Sandford Craze'Sullivan, Melissa, NP  diazepam (VALIUM) 5 MG tablet Take 1 tablet in the am, 1/2 tablet at lunch and 1 tablet at night 01/10/19   Corie Chiquitoarter, Jessica, PMHNP  diclofenac sodium (VOLTAREN) 1 % GEL Apply 2 g topically 4 (four) times daily. Rub into affected area of foot 2 to 4 times daily 08/24/18   Vivi BarrackWagoner, Matthew R, DPM  Docusate Calcium (STOOL SOFTENER PO) Take 3 tablets by mouth daily.    [provider]  ELIQUIS 5 MG TABS tablet TAKE  1 TABLET BY MOUTH TWICE A DAY 01/04/19   [name], Jilda RocheNicholas Paul, DO  gabapentin (NEURONTIN) 400 MG capsule TAKE 1 CAPSULE TWICE DAILY  and 4 CAPSULES AT BEDTIME 01/10/19   Corie Chiquitoarter, Jessica, PMHNP  levocetirizine (XYZAL) 5 MG tablet TAKE 1 TABLET BY MOUTH EVERY DAY IN THE EVENING 12/31/18   [name], Jilda RocheNicholas Paul, DO  levothyroxine (SYNTHROID, LEVOTHROID) 88 MCG tablet Take 1 tablet (88 mcg total) by mouth daily. 06/18/18   Sharlene DoryWendling, [name] Paul, DO  LINZESS 290 MCG CAPS capsule TAKE 1 CAPSULE BY MOUTH DAILY ON EMPTY STOMACH AT LEAST 30 MINUTES PRIOR TO 1ST MEAL OF THE DAY 07/30/18   Sharlene DoryWendling, [name] Paul, DO  lisinopril (ZESTRIL) 40 MG tablet TAKE 1 TABLET BY MOUTH EVERY DAY 12/07/18   [name], Jilda RocheNicholas Paul, DO  mirtazapine (REMERON) 45 MG tablet Take 1 tablet (45 mg total) by mouth at bedtime. 01/10/19   Corie Chiquitoarter, Jessica, PMHNP  montelukast (SINGULAIR) 10 MG tablet Take 1 tablet (10 mg total) by mouth at bedtime. 12/31/18   Sharlene DoryWendling, [name] Paul, DO  MYRBETRIQ 50 MG TB24 tablet TAKE 1 TABLET BY MOUTH EVERY DAY 10/08/18   Sharlene DoryWendling, [name] Paul, DO  omeprazole (PRILOSEC) 40 MG capsule Take 1 capsule (40 mg total) by mouth daily. 06/18/18   Sharlene DoryWendling, [name] Paul, DO  Probiotic Product (PROBIOTIC DAILY PO) Take 2 tablets by mouth daily.    [provider]  promethazine (PHENERGAN) 6.25 MG/5ML syrup Take 5 mLs (6.25 mg total) by mouth every 8 (eight) hours as needed for nausea. 01/08/19   Sharlene DoryWendling, [name] Paul, DO  tiotropium (SPIRIVA) 18 MCG inhalation capsule Place 1 capsule (18 mcg total) into inhaler and inhale daily. 01/08/19   Sharlene DoryWendling, [name] Paul, DO    Family History Family History  Problem Relation Age of Onset  . Breast cancer Mother   . Arthritis Mother   . Hearing loss Mother   . Hyperlipidemia Mother   . Hypertension Mother   . Anxiety disorder Mother   . Lung cancer Maternal Uncle   . Anxiety disorder Maternal Uncle   . Lung cancer Maternal Grandmother   . Heart attack  Maternal Grandmother   . Anxiety disorder Maternal Grandmother   . Asthma Father   . COPD Father   . Hyperlipidemia Father   . Hypertension Father   . Alcohol abuse Father   . Anxiety disorder Sister   . Hypertension Sister   . Thyroid cancer Sister   . Anxiety disorder Brother   . Hypertension Brother   . Drug abuse Son   . Anxiety disorder Sister   . Hypertension Sister   . Anxiety disorder Sister   . Hypertension Sister   .  Hyperlipidemia Son     Social History Social History   Tobacco Use  . Smoking status: Current Every Day Smoker    Packs/day: 0.50    Years: 44.00    Pack years: 22.00    Types: Cigarettes  . Smokeless tobacco: Never Used  Substance Use Topics  . Alcohol use: No  . Drug use: No     Allergies   Patient has no known allergies.   Review of Systems Review of Systems  Constitutional: Negative for fever.  Respiratory: Negative for shortness of breath.   Cardiovascular: Negative for chest pain.  Musculoskeletal: Positive for joint swelling and myalgias.  Neurological: Positive for numbness.  All other systems reviewed and are negative.    Physical Exam Updated Vital Signs BP 100/69 (BP Location: Right Arm)   Pulse (!) 58   Temp 98 F (36.7 C) (Oral)   Resp 18   Ht 4\' 11"  (1.499 m)   Wt 61.2 kg   SpO2 95%   BMI 27.27 kg/m   Physical Exam Vitals signs and nursing note reviewed.  Constitutional:      Appearance: She is well-developed.  HENT:     Head: Normocephalic and atraumatic.     Right Ear: External ear normal.     Left Ear: External ear normal.     Nose: Nose normal.  Eyes:     General:        Right eye: No discharge.        Left eye: No discharge.  Cardiovascular:     Rate and Rhythm: Normal rate and regular rhythm.     Pulses:          Dorsalis pedis pulses are detected w/ Doppler on the right side and detected w/ Doppler on the left side.  Pulmonary:     Effort: Pulmonary effort is normal.     Breath sounds:  Normal breath sounds.  Abdominal:     Palpations: Abdomen is soft.     Tenderness: There is no abdominal tenderness.  Musculoskeletal:     Right ankle: She exhibits swelling (trace, lateral). She exhibits normal range of motion. No tenderness.     Right lower leg: She exhibits tenderness (mild, generalized). She exhibits no bony tenderness and no swelling. No edema.     Right foot: Normal range of motion. No tenderness or swelling.     Comments: Normal warmth in bilateral feet  Skin:    General: Skin is warm and dry.  Neurological:     Mental Status: She is alert.  Psychiatric:        Mood and Affect: Mood is not anxious.      ED Treatments / Results  Labs (all labs ordered are listed, but only abnormal results are displayed) Labs Reviewed - No data to display  EKG None  Radiology Dg Tibia/fibula Right  Result Date: 02/28/2019 CLINICAL DATA:  Ankle and right leg pain, no injury. EXAM: RIGHT TIBIA AND FIBULA - 2 VIEW COMPARISON:  None. FINDINGS: There is no evidence of fracture or dislocation. Minimal tibial spine osteophytosis is noted. Soft tissues are unremarkable. IMPRESSION: No acute abnormality identified. Minimal degenerative joint changes of right knee. Electronically Signed   By: Sherian ReinWei-Chen  Lin M.D.   On: 02/28/2019 09:53   [place] Venous Img Lower Right (dvt Study)  Result Date: 02/28/2019 CLINICAL DATA:  68 year old female with right calf and leg pain EXAM: RIGHT LOWER EXTREMITY VENOUS DOPPLER ULTRASOUND TECHNIQUE: Gray-scale sonography with graded compression, as well as  color Doppler and duplex ultrasound were performed to evaluate the lower extremity deep venous systems from the level of the common femoral vein and including the common femoral, femoral, profunda femoral, popliteal and calf veins including the posterior tibial, peroneal and gastrocnemius veins when visible. The superficial great saphenous vein was also interrogated. Spectral Doppler was utilized to evaluate  flow at rest and with distal augmentation maneuvers in the common femoral, femoral and popliteal veins. COMPARISON:  None. FINDINGS: Contralateral Common Femoral Vein: Respiratory phasicity is normal and symmetric with the symptomatic side. No evidence of thrombus. Normal compressibility. Common Femoral Vein: No evidence of thrombus. Normal compressibility, respiratory phasicity and response to augmentation. Saphenofemoral Junction: No evidence of thrombus. Normal compressibility and flow on color Doppler imaging. Profunda Femoral Vein: No evidence of thrombus. Normal compressibility and flow on color Doppler imaging. Femoral Vein: No evidence of thrombus. Normal compressibility, respiratory phasicity and response to augmentation. Popliteal Vein: No evidence of thrombus. Normal compressibility, respiratory phasicity and response to augmentation. Calf Veins: No evidence of thrombus. Normal compressibility and flow on color Doppler imaging. Superficial Great Saphenous Vein: No evidence of thrombus. Normal compressibility. Venous Reflux:  None. Other Findings:  None. IMPRESSION: No evidence of deep venous thrombosis. Electronically Signed   By: Malachy MoanHeath  McCullough M.D.   On: 02/28/2019 11:50    Procedures Procedures (including critical care time)  Medications Ordered in ED Medications  acetaminophen (TYLENOL) tablet 650 mg (650 mg Oral Given 02/28/19 0904)     Initial Impression / Assessment and Plan / ED Course  I have reviewed the triage vital signs and the nursing notes.  Pertinent labs & imaging results that were available during my care of the patient were reviewed by me and considered in my medical decision making (see chart for details).        DVT ultrasound and x-ray are unremarkable.  She appears neurovascular intact and so it is unclear why she is getting the numbness with walking.  Could be claudication though no evidence of an acute blockage.  Could be neuropathic such as neuropathy.   Either way, this has been going on for several months and I think she is stable for outpatient work-up with PCP.  Final Clinical Impressions(s) / ED Diagnoses   Final diagnoses:  Right leg pain    ED Discharge Orders    None       Pricilla LovelessGoldston, Miyanna Wiersma, MD 02/28/19 [phone]

## 2019-03-17 ENCOUNTER — Other Ambulatory Visit: Payer: Self-pay | Admitting: Family Medicine

## 2019-03-24 ENCOUNTER — Other Ambulatory Visit: Payer: Self-pay | Admitting: Family Medicine

## 2019-03-24 DIAGNOSIS — J302 Other seasonal allergic rhinitis: Secondary | ICD-10-CM

## 2019-03-28 ENCOUNTER — Other Ambulatory Visit: Payer: Self-pay | Admitting: Family Medicine

## 2019-03-30 ENCOUNTER — Other Ambulatory Visit: Payer: Self-pay | Admitting: Family Medicine

## 2019-03-30 DIAGNOSIS — Z86711 Personal history of pulmonary embolism: Secondary | ICD-10-CM

## 2019-03-30 DIAGNOSIS — Z7901 Long term (current) use of anticoagulants: Secondary | ICD-10-CM

## 2019-04-13 ENCOUNTER — Encounter (HOSPITAL_BASED_OUTPATIENT_CLINIC_OR_DEPARTMENT_OTHER): Payer: Self-pay | Admitting: Emergency Medicine

## 2019-04-13 ENCOUNTER — Other Ambulatory Visit: Payer: Self-pay

## 2019-04-13 ENCOUNTER — Emergency Department (HOSPITAL_BASED_OUTPATIENT_CLINIC_OR_DEPARTMENT_OTHER)
Admission: EM | Admit: 2019-04-13 | Discharge: 2019-04-13 | Disposition: A | Discharge status: Home / Self Care | Payer: PPO | Attending: Emergency Medicine | Admitting: Emergency Medicine

## 2019-04-13 ENCOUNTER — Emergency Department (HOSPITAL_BASED_OUTPATIENT_CLINIC_OR_DEPARTMENT_OTHER): Payer: PPO

## 2019-04-13 DIAGNOSIS — D696 Thrombocytopenia, unspecified: Secondary | ICD-10-CM | POA: Diagnosis not present

## 2019-04-13 DIAGNOSIS — Z79899 Other long term (current) drug therapy: Secondary | ICD-10-CM | POA: Diagnosis not present

## 2019-04-13 DIAGNOSIS — J440 Chronic obstructive pulmonary disease with acute lower respiratory infection: Secondary | ICD-10-CM | POA: Diagnosis not present

## 2019-04-13 DIAGNOSIS — I10 Essential (primary) hypertension: Secondary | ICD-10-CM | POA: Diagnosis not present

## 2019-04-13 DIAGNOSIS — E890 Postprocedural hypothyroidism: Secondary | ICD-10-CM | POA: Insufficient documentation

## 2019-04-13 DIAGNOSIS — J40 Bronchitis, not specified as acute or chronic: Secondary | ICD-10-CM

## 2019-04-13 DIAGNOSIS — J441 Chronic obstructive pulmonary disease with (acute) exacerbation: Secondary | ICD-10-CM | POA: Diagnosis not present

## 2019-04-13 DIAGNOSIS — F17210 Nicotine dependence, cigarettes, uncomplicated: Secondary | ICD-10-CM | POA: Diagnosis not present

## 2019-04-13 DIAGNOSIS — R0602 Shortness of breath: Secondary | ICD-10-CM | POA: Diagnosis not present

## 2019-04-13 LAB — BASIC METABOLIC PANEL
Anion gap: 12 (ref 5–15)
BUN: 17 mg/dL (ref 8–23)
CO2: 24 mmol/L (ref 22–32)
Calcium: 10.4 mg/dL — ABNORMAL HIGH (ref 8.9–10.3)
Chloride: 101 mmol/L (ref 98–111)
Creatinine, Ser: 1.04 mg/dL — ABNORMAL HIGH (ref 0.44–1.00)
GFR calc Af Amer: >60 mL/min (ref >60)
GFR calc non Af Amer: 56 mL/min — ABNORMAL LOW (ref >60)
Glucose, Bld: 100 mg/dL — ABNORMAL HIGH (ref 70–99)
Potassium: 4.7 mmol/L (ref 3.5–5.1)
Sodium: 137 mmol/L (ref 135–145)

## 2019-04-13 LAB — CBC WITH DIFFERENTIAL/PLATELET
Abs Immature Granulocytes: 0.05 10*3/uL (ref 0.00–0.07)
Basophils Absolute: 0 10*3/uL (ref 0.0–0.1)
Basophils Relative: 0 %
Eosinophils Absolute: 0.5 10*3/uL (ref 0.0–0.5)
Eosinophils Relative: 7 %
HCT: 39.6 % (ref 36.0–46.0)
Hemoglobin: 12.4 g/dL (ref 12.0–15.0)
Immature Granulocytes: 1 %
Lymphocytes Relative: 26 %
Lymphs Abs: 1.7 10*3/uL (ref 0.7–4.0)
MCH: 32.4 pg (ref 26.0–34.0)
MCHC: 31.3 g/dL (ref 30.0–36.0)
MCV: 103.4 fL — ABNORMAL HIGH (ref 80.0–100.0)
Monocytes Absolute: 0.4 10*3/uL (ref 0.1–1.0)
Monocytes Relative: 6 %
Neutro Abs: 3.9 10*3/uL (ref 1.7–7.7)
Neutrophils Relative %: 60 %
Platelets: 93 10*3/uL — ABNORMAL LOW (ref 150–400)
RBC: 3.83 MIL/uL — ABNORMAL LOW (ref 3.87–5.11)
RDW: 13.5 % (ref 11.5–15.5)
WBC: 6.5 10*3/uL (ref 4.0–10.5)
nRBC: 0 % (ref 0.0–0.2)

## 2019-04-13 MED ORDER — ALBUTEROL SULFATE HFA 108 (90 BASE) MCG/ACT IN AERS
8.0000 | INHALATION_SPRAY | Freq: Once | RESPIRATORY_TRACT | Status: AC
  Administered 2019-04-13: 8 via RESPIRATORY_TRACT
  Filled 2019-04-13: qty 6.7

## 2019-04-13 MED ORDER — AZITHROMYCIN 250 MG PO TABS: 250.0000 mg | ORAL_TABLET | Freq: Every day | ORAL | 0 refills | Status: AC

## 2019-04-13 MED ORDER — MAGNESIUM SULFATE 2 GM/50ML IV SOLN
2.0000 g | Freq: Once | INTRAVENOUS | Status: AC
  Administered 2019-04-13: 10:00:00 2 g via INTRAVENOUS
  Filled 2019-04-13: qty 50

## 2019-04-13 MED ORDER — IPRATROPIUM-ALBUTEROL 0.5-2.5 (3) MG/3ML IN SOLN: 3.0000 mL | Freq: Four times a day (QID) | RESPIRATORY_TRACT | 0 refills | Status: DC | PRN

## 2019-04-13 MED ORDER — IPRATROPIUM BROMIDE HFA 17 MCG/ACT IN AERS
4.0000 | INHALATION_SPRAY | Freq: Once | RESPIRATORY_TRACT | Status: AC
  Administered 2019-04-13: 09:00:00 4 via RESPIRATORY_TRACT
  Filled 2019-04-13: qty 12.9

## 2019-04-13 MED ORDER — PREDNISONE 20 MG PO TABS: 60.0000 mg | ORAL_TABLET | Freq: Every day | ORAL | 0 refills | Status: DC

## 2019-04-13 MED ORDER — ALBUTEROL SULFATE HFA 108 (90 BASE) MCG/ACT IN AERS
4.0000 | INHALATION_SPRAY | RESPIRATORY_TRACT | Status: DC | PRN
  Administered 2019-04-13: 4 via RESPIRATORY_TRACT

## 2019-04-13 MED ORDER — METHYLPREDNISOLONE SODIUM SUCC 125 MG IJ SOLR
125.0000 mg | Freq: Once | INTRAMUSCULAR | Status: AC
  Administered 2019-04-13: 125 mg via INTRAVENOUS
  Filled 2019-04-13: qty 2

## 2019-04-13 NOTE — ED Provider Notes (Signed)
[facility] EMERGENCY DEPARTMENT Provider Note   CSN: [id number] Arrival date & time: 04/13/19  2197     History   Chief Complaint Chief Complaint  Patient presents with  . Shortness of Breath    HPI Brenda Dunn is a 68 y.o. female.  She has a history of COPD and PE.  She continues to smoke.  She is complaining of 3 days of increased shortness of breath dyspnea on exertion.  Cough is productive with some yellow-green sputum.  Denies fevers or chills.  No nausea vomiting diarrhea.     The history is provided by the patient.  Shortness of Breath Severity:  Moderate Onset quality:  Gradual Timing:  Constant Progression:  Worsening Chronicity:  Recurrent Context: activity   Relieved by:  Nothing Worsened by:  Activity Ineffective treatments:  Inhaler Associated symptoms: cough, sputum production and wheezing   Associated symptoms: no abdominal pain, no chest pain, no diaphoresis, no fever, no headaches, no hemoptysis, no neck pain, no rash, no sore throat, no syncope and no vomiting   Risk factors: tobacco use     Past Medical History:  Diagnosis Date  . Anxiety   . Benzodiazepine dependence (HCC)   . Colonic polyp    mulitple polyps. repeat in 05/2019  . Constipation    IBS  . COPD (chronic obstructive pulmonary disease) (HCC)   . Depression   . GERD (gastroesophageal reflux disease)   . Hyperlipidemia   . Hypertension   . Insomnia   . Migraine headache   . OAB (overactive bladder)   . Osteoarthritis   . Osteoporosis 12/19/2017  . Post-surgical hypothyroidism   . Pulmonary embolism (HCC)    Eliquis to stop 05/03/19  . Scoliosis     Patient Active Problem List   Diagnosis Date Noted  . Chronic throat clearing 06/18/2018  . Bunion 05/04/2018  . Severe mood disorder without psychotic features (HCC) 04/16/2018  . Osteoporosis 12/19/2017  . Seasonal allergies 11/24/2017  . Solitary pulmonary nodule on lung CT 06/02/2017  . Tobacco abuse 05/02/2017   . Essential hypertension   . GERD (gastroesophageal reflux disease)   . Post-surgical hypothyroidism   . Hyperlipidemia   . Osteoarthritis   . Depression   . Constipation   . Scoliosis   . Migraine headache   . Insomnia   . OAB (overactive bladder)   . Colonic polyp   . History of colonic polyps 02/06/2017  . COPD (chronic obstructive pulmonary disease) with chronic bronchitis (HCC) 12/07/2016  . GAD (generalized anxiety disorder) 12/07/2016  . RLS (restless legs syndrome) 12/07/2016    Past Surgical History:  Procedure Laterality Date  . BREAST CYST ASPIRATION Left   . HEMORRHOID SURGERY    . THYROIDECTOMY     at [facility]  . TUBAL LIGATION  1979     OB History   No obstetric history on file.      Home Medications    Prior to Admission medications   Medication Sig Start Date End Date Taking? Authorizing Provider  albuterol (VENTOLIN HFA) 108 (90 Base) MCG/ACT inhaler INHALE 2 PUFFS BY MOUTH EVERY 4 HOURS AS NEEDED FOR WHEEZING OR SHORTNESS OF BREATH 03/18/19   Carmelia Roller, Jilda Roche, DO  alendronate (FOSAMAX) 70 MG tablet TAKE 1 TABLET EVERY WEEK WITH FULL GLASS OF WATER ON EMPTY STOMACH 11/16/18   Sharlene Dory, DO  atorvastatin (LIPITOR) 40 MG tablet TAKE 1 TABLET BY MOUTH EVERYDAY AT BEDTIME 01/30/19   Sharlene Dory, DO  azithromycin (ZITHROMAX) 250 MG tablet Take 2 tabs the first day and then 1 tab daily until you run out. 01/08/19   Carmelia RollerWendling, Jilda RocheNicholas Paul, DO  budesonide-formoterol (SYMBICORT) 80-4.5 MCG/ACT inhaler TAKE 2 PUFFS BY MOUTH TWICE A DAY 12/31/18   [name], Jilda RocheNicholas Paul, DO  Calcium Carbonate-Vitamin D (CALTRATE 600+D) 600-400 MG-UNIT tablet Take 1 tablet by mouth 2 (two) times daily. 12/19/17   Sandford Craze'Sullivan, Melissa, NP  diazepam (VALIUM) 5 MG tablet Take 1 tablet in the am, 1/2 tablet at lunch and 1 tablet at night 01/10/19   Corie Chiquitoarter, Jessica, PMHNP  diclofenac sodium (VOLTAREN) 1 % GEL Apply 2 g topically 4 (four) times daily. Rub into affected  area of foot 2 to 4 times daily 08/24/18   Vivi BarrackWagoner, Matthew R, DPM  Docusate Calcium (STOOL SOFTENER PO) Take 3 tablets by mouth daily.    [provider]  ELIQUIS 5 MG TABS tablet TAKE 1 TABLET BY MOUTH TWICE A DAY 04/01/19   [name], Jilda RocheNicholas Paul, DO  gabapentin (NEURONTIN) 400 MG capsule TAKE 1 CAPSULE TWICE DAILY  and 4 CAPSULES AT BEDTIME 01/10/19   Corie Chiquitoarter, Jessica, PMHNP  levocetirizine (XYZAL) 5 MG tablet TAKE 1 TABLET BY MOUTH EVERY DAY IN THE EVENING 03/25/19   [name], Jilda RocheNicholas Paul, DO  levothyroxine (SYNTHROID, LEVOTHROID) 88 MCG tablet Take 1 tablet (88 mcg total) by mouth daily. 06/18/18   Sharlene DoryWendling, [name] Paul, DO  LINZESS 290 MCG CAPS capsule TAKE 1 CAPSULE BY MOUTH DAILY ON EMPTY STOMACH AT LEAST 30 MINUTES PRIOR TO 1ST MEAL OF THE DAY 03/28/19   Sharlene DoryWendling, [name] Paul, DO  lisinopril (ZESTRIL) 40 MG tablet TAKE 1 TABLET BY MOUTH EVERY DAY 12/07/18   [name], Jilda RocheNicholas Paul, DO  mirtazapine (REMERON) 45 MG tablet Take 1 tablet (45 mg total) by mouth at bedtime. 01/10/19   Corie Chiquitoarter, Jessica, PMHNP  montelukast (SINGULAIR) 10 MG tablet Take 1 tablet (10 mg total) by mouth at bedtime. 12/31/18   Sharlene DoryWendling, [name] Paul, DO  MYRBETRIQ 50 MG TB24 tablet TAKE 1 TABLET BY MOUTH EVERY DAY 10/08/18   Sharlene DoryWendling, [name] Paul, DO  omeprazole (PRILOSEC) 40 MG capsule Take 1 capsule (40 mg total) by mouth daily. 06/18/18   Sharlene DoryWendling, [name] Paul, DO  Probiotic Product (PROBIOTIC DAILY PO) Take 2 tablets by mouth daily.    [provider]  promethazine (PHENERGAN) 6.25 MG/5ML syrup Take 5 mLs (6.25 mg total) by mouth every 8 (eight) hours as needed for nausea. 01/08/19   Sharlene DoryWendling, [name] Paul, DO  tiotropium (SPIRIVA) 18 MCG inhalation capsule Place 1 capsule (18 mcg total) into inhaler and inhale daily. 01/08/19   Sharlene DoryWendling, [name] Paul, DO    Family History Family History  Problem Relation Age of Onset  . Breast cancer Mother   . Arthritis Mother   . Hearing loss Mother   .  Hyperlipidemia Mother   . Hypertension Mother   . Anxiety disorder Mother   . Lung cancer Maternal Uncle   . Anxiety disorder Maternal Uncle   . Lung cancer Maternal Grandmother   . Heart attack Maternal Grandmother   . Anxiety disorder Maternal Grandmother   . Asthma Father   . COPD Father   . Hyperlipidemia Father   . Hypertension Father   . Alcohol abuse Father   . Anxiety disorder Sister   . Hypertension Sister   . Thyroid cancer Sister   . Anxiety disorder Brother   . Hypertension Brother   . Drug abuse Son   . Anxiety disorder Sister   .  Hypertension Sister   . Anxiety disorder Sister   . Hypertension Sister   . Hyperlipidemia Son     Social History Social History   Tobacco Use  . Smoking status: Current Every Day Smoker    Packs/day: 0.50    Years: 44.00    Pack years: 22.00    Types: Cigarettes  . Smokeless tobacco: Never Used  Substance Use Topics  . Alcohol use: No  . Drug use: No     Allergies   Patient has no known allergies.   Review of Systems Review of Systems  Constitutional: Negative for diaphoresis and fever.  HENT: Negative for sore throat.   Eyes: Negative for visual disturbance.  Respiratory: Positive for cough, sputum production, shortness of breath and wheezing. Negative for hemoptysis.   Cardiovascular: Negative for chest pain, leg swelling and syncope.  Gastrointestinal: Negative for abdominal pain and vomiting.  Genitourinary: Negative for dysuria.  Musculoskeletal: Negative for neck pain.  Skin: Negative for rash.  Neurological: Negative for headaches.     Physical Exam Updated Vital Signs BP (!) 157/78   Pulse 83   Temp 98.3 F (36.8 C) (Oral)   Resp (!) 22   SpO2 96%   Physical Exam Vitals signs and nursing note reviewed.  Constitutional:      General: She is not in acute distress.    Appearance: She is well-developed.  HENT:     Head: Normocephalic and atraumatic.  Eyes:     Conjunctiva/sclera: Conjunctivae  normal.  Neck:     Musculoskeletal: Neck supple.  Cardiovascular:     Rate and Rhythm: Normal rate and regular rhythm.     Heart sounds: No murmur.  Pulmonary:     Effort: Pulmonary effort is normal. No respiratory distress.     Breath sounds: Wheezing present.  Abdominal:     Palpations: Abdomen is soft.     Tenderness: There is no abdominal tenderness.  Musculoskeletal: Normal range of motion.     Right lower leg: She exhibits no tenderness. No edema.     Left lower leg: She exhibits no tenderness. No edema.  Skin:    General: Skin is warm and dry.     Capillary Refill: Capillary refill takes less than 2 seconds.  Neurological:     General: No focal deficit present.     Mental Status: She is alert.     Gait: Gait normal.      ED Treatments / Results  Labs (all labs ordered are listed, but only abnormal results are displayed) Labs Reviewed  BASIC METABOLIC PANEL - Abnormal; Notable for the following components:      Result Value   Glucose, Bld 100 (*)    Creatinine, Ser 1.04 (*)    Calcium 10.4 (*)    GFR calc non Af Amer 56 (*)    All other components within normal limits  CBC WITH DIFFERENTIAL/PLATELET - Abnormal; Notable for the following components:   RBC 3.83 (*)    MCV 103.4 (*)    Platelets 93 (*)    All other components within normal limits    EKG EKG Interpretation  Date/Time:  Saturday April 13 2019 08:52:02 EDT Ventricular Rate:  80 PR Interval:    QRS Duration: 97 QT Interval:  363 QTC Calculation: 419 R Axis:   -108 Text Interpretation:  Sinus rhythm Left anterior fascicular block Borderline low voltage, extremity leads similar to prior 1/19 Confirmed by Meridee Score [phone]) on 04/13/2019 8:58:05 AM   Radiology  Dg Chest Port 1 View  Result Date: 04/13/2019 CLINICAL DATA:  68 year old female with history of shortness of breath. EXAM: PORTABLE CHEST 1 VIEW COMPARISON:  Chest x-ray 08/06/2017. FINDINGS: Lung volumes are low. No  consolidative airspace disease. No pleural effusions. No pneumothorax. No pulmonary nodule or mass noted. Pulmonary vasculature and the cardiomediastinal silhouette are within normal limits. IMPRESSION: 1. Low lung volumes without radiographic evidence of acute cardiopulmonary disease. Electronically Signed   By: Trudie Reedaniel  Entrikin M.D.   On: 04/13/2019 09:53    Procedures Procedures (including critical care time)  Medications Ordered in ED Medications  albuterol (VENTOLIN HFA) 108 (90 Base) MCG/ACT inhaler 4 puff (has no administration in time range)  magnesium sulfate IVPB 2 g 50 mL (has no administration in time range)  methylPREDNISolone sodium succinate (SOLU-MEDROL) 125 mg/2 mL injection 125 mg (has no administration in time range)  albuterol (VENTOLIN HFA) 108 (90 Base) MCG/ACT inhaler 8 puff (8 puffs Inhalation Given 04/13/19 0850)  ipratropium (ATROVENT HFA) inhaler 4 puff (4 puffs Inhalation Given 04/13/19 0851)     Initial Impression / Assessment and Plan / ED Course  I have reviewed the triage vital signs and the nursing notes.  Pertinent labs & imaging results that were available during my care of the patient were reviewed by me and considered in my medical decision making (see chart for details).  Clinical Course as of Apr 13 1631  Sat Apr 13, 2019  [id number] 68 year old female with history of COPD PE on anticoagulation here with 2 days of increased shortness of breath dyspnea on exertion and productive cough.  She has diffuse wheezes on exam.  Differential diagnosis includes COPD exacerbation, bronchitis, pneumonia, CHF, PE, pneumothorax.   [MB]  [id number] Patient's chest x-ray reviewed by me.  No gross infiltrates.  Awaiting radiology reading.  She is received some inhalation treatments and steroids and is getting some magnesium.   [MB]  1000 Reevaluated patient.  She has better air movement and less wheezing.  We will cover her with Zithromax for COPD exacerbation and refill her  DuoNeb's along with prednisone   [MB]  1022 Patient's platelets came back at 93.  This seems to be different than her prior values.  This will need to be followed up primary care doctor.   [MB]    Clinical Course User Index [MB] Terrilee FilesButler, Ildefonso Keaney C, MD   Elisha PonderSonja E Bou was evaluated in Emergency Department on 04/13/2019 for the symptoms described in the history of present illness. She was evaluated in the context of the global COVID-19 pandemic, which necessitated consideration that the patient might be at risk for infection with the SARS-CoV-2 virus that causes COVID-19. Institutional protocols and algorithms that pertain to the evaluation of patients at risk for COVID-19 are in a state of rapid change based on information released by regulatory bodies including the CDC and federal and state organizations. These policies and algorithms were followed during the patient's care in the ED.     Final Clinical Impressions(s) / ED Diagnoses   Final diagnoses:  COPD exacerbation (HCC)  Bronchitis  Thrombocytopenia (HCC)    ED Discharge Orders         Ordered    predniSONE (DELTASONE) 20 MG tablet  Daily     04/13/19 1005    azithromycin (ZITHROMAX Z-PAK) 250 MG tablet  Daily     04/13/19 1005    ipratropium-albuterol (DUONEB) 0.5-2.5 (3) MG/3ML SOLN  Every 6 hours PRN     04/13/19 1005  Hayden Rasmussen, MD 04/13/19 [phone]

## 2019-04-13 NOTE — ED Triage Notes (Signed)
Pt history unclear based on subjective information given, however, pt does state she has had SOB x 3 days. Cough is unproductive and persistent. Difficulty forming whole sentences without catching breath. Has been taking expired nebulizer.

## 2019-04-13 NOTE — Discharge Instructions (Addendum)
You were seen in the emergency department for increased shortness of breath and cough.  Your x-ray did not show an obvious pneumonia.  We are treating you with antibiotics steroids and breathing treatments for what is likely bronchitis and a COPD exacerbation.  You should limit your smoking.  Please contact your primary care doctor for close follow-up and repeat blood work  Return if any worsening symptoms.

## 2019-04-15 ENCOUNTER — Other Ambulatory Visit: Payer: Self-pay | Admitting: Family Medicine

## 2019-04-15 DIAGNOSIS — J441 Chronic obstructive pulmonary disease with (acute) exacerbation: Secondary | ICD-10-CM

## 2019-04-18 ENCOUNTER — Other Ambulatory Visit: Payer: Self-pay

## 2019-04-19 ENCOUNTER — Ambulatory Visit (INDEPENDENT_AMBULATORY_CARE_PROVIDER_SITE_OTHER): Payer: PPO | Admitting: Family Medicine

## 2019-04-19 ENCOUNTER — Encounter: Payer: Self-pay | Admitting: Family Medicine

## 2019-04-19 DIAGNOSIS — J441 Chronic obstructive pulmonary disease with (acute) exacerbation: Secondary | ICD-10-CM | POA: Diagnosis not present

## 2019-04-19 MED ORDER — PROMETHAZINE-DM 6.25-15 MG/5ML PO SYRP: 5.0000 mL | ORAL_SOLUTION | Freq: Four times a day (QID) | ORAL | 0 refills | Status: DC | PRN

## 2019-04-19 MED ORDER — PREDNISONE 20 MG PO TABS: 40.0000 mg | ORAL_TABLET | Freq: Every day | ORAL | 0 refills | Status: AC

## 2019-04-19 MED ORDER — DOXYCYCLINE HYCLATE 100 MG PO TABS: 100.0000 mg | ORAL_TABLET | Freq: Two times a day (BID) | ORAL | 0 refills | Status: AC

## 2019-04-19 NOTE — Progress Notes (Signed)
Chief Complaint  Patient presents with  . Cough    bronchitis//needs more antibiotic  . Wheezing    Gerrianne Scale here for URI complaints. Due to COVID-19 pandemic, we are interacting via telephone. I verified patient's ID using 2 identifiers. Patient agreed to proceed with visit via this method. Patient is at home, I am at office. Patient and I are present for visit.   Duration: 9 days  Associated symptoms: wheezing and shortness of breath Denies: sinus congestion, sinus pain, rhinorrhea, ear pain, ear drainage, sore throat, myalgia and fevers Treatment to date: Zpak, prednisone Sick contacts: No  ROS:  Const: Denies fevers HEENT: As noted in HPI Lungs: +SOB  Past Medical History:  Diagnosis Date  . Anxiety   . Benzodiazepine dependence ([place])   . Colonic polyp    mulitple polyps. repeat in 05/2019  . Constipation    IBS  . COPD (chronic obstructive pulmonary disease) ([place])   . Depression   . GERD (gastroesophageal reflux disease)   . Hyperlipidemia   . Hypertension   . Insomnia   . Migraine headache   . OAB (overactive bladder)   . Osteoarthritis   . Osteoporosis 12/19/2017  . Post-surgical hypothyroidism   . Pulmonary embolism ([place])    Eliquis to stop 05/03/19  . Scoliosis    Exam No conversational dyspnea Age appropriate judgment and insight Nml affect and mood  COPD exacerbation (HCC) - Plan: predniSONE (DELTASONE) 20 MG tablet, doxycycline (VIBRA-TABS) 100 MG tablet, promethazine-dextromethorphan (PROMETHAZINE-DM) 6.25-15 MG/5ML syrup  Orders as above. Continue to push fluids, practice good hand hygiene, cover mouth when coughing. F/u prn. If starting to experience fevers, shaking, or worsening shortness of breath, seek immediate care. Total time spent: 16 min Pt voiced understanding and agreement to the plan.  [place], DO 04/19/19 11:04 AM

## 2019-04-23 ENCOUNTER — Other Ambulatory Visit: Payer: Self-pay | Admitting: Psychiatry

## 2019-04-23 DIAGNOSIS — F411 Generalized anxiety disorder: Secondary | ICD-10-CM

## 2019-04-23 DIAGNOSIS — F5101 Primary insomnia: Secondary | ICD-10-CM

## 2019-04-28 ENCOUNTER — Other Ambulatory Visit: Payer: Self-pay | Admitting: Family Medicine

## 2019-05-01 ENCOUNTER — Other Ambulatory Visit: Payer: Self-pay | Admitting: Family Medicine

## 2019-06-06 ENCOUNTER — Telehealth: Payer: Self-pay | Admitting: Family Medicine

## 2019-06-06 NOTE — Telephone Encounter (Signed)
Pt asked if she should have lab work to recheck TSH. She has gained some weight, having cramps in her right foot and sometimes in her right hand. Pt says that she has an underlying lung condition and cannot wear a mask to come in so has not made an appointment. Maybe come out and draw labs at car?  Pt has been drinking some smoothies with banana in it to help with cramping.  Please advise.

## 2019-06-07 NOTE — Telephone Encounter (Signed)
Called the patient informed we are unable to accommodate this  Request. She mentioned the cramps she is having and scheduled her a telephone visit on Monday 119/2020

## 2019-06-07 NOTE — Telephone Encounter (Signed)
Noted  

## 2019-06-10 ENCOUNTER — Other Ambulatory Visit: Payer: Self-pay

## 2019-06-10 ENCOUNTER — Encounter: Payer: Self-pay | Admitting: Psychiatry

## 2019-06-10 ENCOUNTER — Ambulatory Visit (INDEPENDENT_AMBULATORY_CARE_PROVIDER_SITE_OTHER): Payer: PPO | Admitting: Family Medicine

## 2019-06-10 ENCOUNTER — Encounter: Payer: Self-pay | Admitting: Family Medicine

## 2019-06-10 ENCOUNTER — Ambulatory Visit (INDEPENDENT_AMBULATORY_CARE_PROVIDER_SITE_OTHER): Payer: PPO | Admitting: Psychiatry

## 2019-06-10 DIAGNOSIS — F411 Generalized anxiety disorder: Secondary | ICD-10-CM

## 2019-06-10 DIAGNOSIS — R252 Cramp and spasm: Secondary | ICD-10-CM

## 2019-06-10 DIAGNOSIS — E890 Postprocedural hypothyroidism: Secondary | ICD-10-CM | POA: Diagnosis not present

## 2019-06-10 DIAGNOSIS — K635 Polyp of colon: Secondary | ICD-10-CM | POA: Diagnosis not present

## 2019-06-10 DIAGNOSIS — F5101 Primary insomnia: Secondary | ICD-10-CM | POA: Diagnosis not present

## 2019-06-10 DIAGNOSIS — F39 Unspecified mood [affective] disorder: Secondary | ICD-10-CM | POA: Diagnosis not present

## 2019-06-10 MED ORDER — DIAZEPAM 5 MG PO TABS: ORAL_TABLET | ORAL | 5 refills | Status: DC

## 2019-06-10 MED ORDER — RISPERIDONE 0.5 MG PO TABS: 0.5000 mg | ORAL_TABLET | Freq: Every day | ORAL | 2 refills | Status: DC

## 2019-06-10 MED ORDER — MIRTAZAPINE 45 MG PO TABS: 45.0000 mg | ORAL_TABLET | Freq: Every day | ORAL | 1 refills | Status: DC

## 2019-06-10 NOTE — Progress Notes (Signed)
Chief Complaint  Patient presents with  . cramps    Subjective: Patient is a 68 y.o. female here for wt gain. Due to COVID-19 pandemic, we are interacting via telephone. I verified patient's ID using 2 identifiers. Patient agreed to proceed with visit via this method. Patient is at home, I am at office. Patient and I are present for visit.   Hypothyroidism Patient presents for follow-up of hypothyroidism.  Reports compliance with medication- levothyroxine 88 mcg. Current symptoms include: weight gain Denies: denies fatigue, fatigue, heat/cold intolerance, bowel/skin changes or CVS symptoms She believes her dose should be unchanged  1 week has been having cramping over 1 week. Did eat some pickles and it got better. Drinks a lot of fluids, but a lot of coffee.   +hx of polyps, needs ccs and referral.   ROS: Endo: +wt gain  MSK: +cramping  Past Medical History:  Diagnosis Date  . Anxiety   . Benzodiazepine dependence ([place])   . Colonic polyp    mulitple polyps. repeat in 05/2019  . Constipation    IBS  . COPD (chronic obstructive pulmonary disease) ([place])   . Depression   . GERD (gastroesophageal reflux disease)   . Hyperlipidemia   . Hypertension   . Insomnia   . Migraine headache   . OAB (overactive bladder)   . Osteoarthritis   . Osteoporosis 12/19/2017  . Post-surgical hypothyroidism   . Pulmonary embolism ([place])    Eliquis to stop 05/03/19  . Scoliosis     Objective: No conversational dyspnea Age appropriate judgment and insight Nml affect and mood  Assessment and Plan: Post-surgical hypothyroidism  Cramping of feet  Will ck thyroid levels when able.  Stay hydrated. Consider pickle juice/monitor and Mg. Total time spent: 22 min  F/u prn.  The patient voiced understanding and agreement to the plan.  [place], DO 06/10/19  2:41 PM

## 2019-06-10 NOTE — Progress Notes (Signed)
Brenda Dunn [id number] 1951/01/10 68 y.o.  Virtual Visit via Telephone Note  I connected with pt on 06/10/19 at 11:30 AM EST by telephone and verified that I am speaking with the correct person using two identifiers.   I discussed the limitations, risks, security and privacy concerns of performing an evaluation and management service by telephone and the availability of in person appointments. I also discussed with the patient that there may be a patient responsible charge related to this service. The patient expressed understanding and agreed to proceed.   I discussed the assessment and treatment plan with the patient. The patient was provided an opportunity to ask questions and all were answered. The patient agreed with the plan and demonstrated an understanding of the instructions.   The patient was advised to call back or seek an in-person evaluation if the symptoms worsen or if the condition fails to improve as anticipated.  I provided 40 minutes of non-face-to-face time during this encounter.  The patient was located at home.  The provider was located at [place].   Brenda Dunn, PMHNP   Subjective:   Patient ID:  Brenda Dunn is a 68 y.o. (DOB 1950/11/20) female.  Chief Complaint:  Chief Complaint  Patient presents with  . Anxiety  . Other    Irritability  . Insomnia    HPI Brenda Dunn presents for follow-up of anxiety, mood disturbance, and insomnia. "I'm beginning to wonder if I am Bipolar. I get agitated and frustrated." She reports that she has experienced some mood lability.She reports that she has had worsening mood and anxiety with anniversary of son's death. She reports that she has noticed irritability and will become irritated by things that in retrospect that she identifies as minor. Reports irritated by noises, other drivers, etc. Reports that she has been arguing more with husband. She reports that she has been having difficulty getting the correct  words out because she is having racing thoughts. She reports severe persistent anxiety. She denies any impulsive or risky behaviors other than having some impulses to buy things she does not need and often can control this. She reports excessive energy and restlessness. Occasional panic attacks. Reports motivation is at baseline and describes taking care of the lawn, buying Christmas directions, and walking to restaurant at time of phone call. She reports worsening concentration. Sleeping 4-5 hours. She reports that she has gained weight and is trying to lose weight. Denies SI.   She reports that she has severe anxiety and SOB with wearing a mask.   Past Psychiatric Medication Trials: Remeron- Had worsening depression and insomnia with 30 mg dose. Olanzapine Seroquel  Diazepam Gabapentin Trazodone Trileptal- hyponatremia Ambien  Review of Systems:  Review of Systems  HENT: Positive for postnasal drip.   Respiratory: Positive for shortness of breath.   Musculoskeletal: Negative for gait problem.  Allergic/Immunologic: Positive for environmental allergies.  Psychiatric/Behavioral:       Please refer to HPI    Medications: I have reviewed the patient's current medications.  Current Outpatient Medications  Medication Sig Dispense Refill  . albuterol (VENTOLIN HFA) 108 (90 Base) MCG/ACT inhaler INHALE 2 PUFFS BY MOUTH EVERY 4 HOURS AS NEEDED FOR WHEEZING OR SHORTNESS OF BREATH 8.5 g 1  . alendronate (FOSAMAX) 70 MG tablet TAKE 1 TABLET EVERY WEEK WITH FULL GLASS OF WATER ON EMPTY STOMACH 12 tablet 3  . atorvastatin (LIPITOR) 40 MG tablet TAKE 1 TABLET BY MOUTH EVERYDAY AT BEDTIME 90 tablet 0  .  budesonide-formoterol (SYMBICORT) 80-4.5 MCG/ACT inhaler TAKE 2 PUFFS BY MOUTH TWICE A DAY 3 Inhaler 3  . Calcium Carbonate-Vitamin D (CALTRATE 600+D) 600-400 MG-UNIT tablet Take 1 tablet by mouth 2 (two) times daily. 60 tablet 11  . diazepam (VALIUM) 5 MG tablet Take 1 tablet in the am, 1/2 tablet  at lunch and 1 tablet at night 75 tablet 5  . diclofenac sodium (VOLTAREN) 1 % GEL Apply 2 g topically 4 (four) times daily. Rub into affected area of foot 2 to 4 times daily 100 g 2  . Docusate Calcium (STOOL SOFTENER PO) Take 3 tablets by mouth daily.    Marland Kitchen. ELIQUIS 5 MG TABS tablet TAKE 1 TABLET BY MOUTH TWICE A DAY 60 tablet 2  . gabapentin (NEURONTIN) 400 MG capsule TAKE 1 CAPSULE TWICE DAILY AND 4 CAPSULES AT BEDTIME 540 capsule 1  . ipratropium-albuterol (DUONEB) 0.5-2.5 (3) MG/3ML SOLN Take 3 mLs by nebulization every 6 (six) hours as needed (wheezing). 360 mL 0  . levocetirizine (XYZAL) 5 MG tablet TAKE 1 TABLET BY MOUTH EVERY DAY IN THE EVENING 90 tablet 1  . levothyroxine (SYNTHROID, LEVOTHROID) 88 MCG tablet Take 1 tablet (88 mcg total) by mouth daily. 30 tablet 3  . LINZESS 290 MCG CAPS capsule TAKE 1 CAPSULE BY MOUTH DAILY ON EMPTY STOMACH AT LEAST 30 MINUTES PRIOR TO 1ST MEAL OF THE DAY 90 capsule 1  . lisinopril (ZESTRIL) 40 MG tablet TAKE 1 TABLET BY MOUTH EVERY DAY 90 tablet 1  . mirtazapine (REMERON) 45 MG tablet Take 1 tablet (45 mg total) by mouth at bedtime. 90 tablet 1  . montelukast (SINGULAIR) 10 MG tablet Take 1 tablet (10 mg total) by mouth at bedtime. 90 tablet 1  . MYRBETRIQ 50 MG TB24 tablet TAKE 1 TABLET BY MOUTH EVERY DAY 90 tablet 1  . omeprazole (PRILOSEC) 40 MG capsule Take 1 capsule (40 mg total) by mouth daily. 90 capsule 3  . Probiotic Product (PROBIOTIC DAILY PO) Take 2 tablets by mouth daily.    . promethazine-dextromethorphan (PROMETHAZINE-DM) 6.25-15 MG/5ML syrup Take 5 mLs by mouth 4 (four) times daily as needed. 118 mL 0  . tiotropium (SPIRIVA) 18 MCG inhalation capsule Place 1 capsule (18 mcg total) into inhaler and inhale daily. 30 capsule 12  . risperiDONE (RISPERDAL) 0.5 MG tablet Take 1 tablet (0.5 mg total) by mouth at bedtime. 30 tablet 2   No current facility-administered medications for this visit.     Medication Side Effects:  None  Allergies: No Known Allergies  Past Medical History:  Diagnosis Date  . Anxiety   . Benzodiazepine dependence (HCC)   . Colonic polyp    mulitple polyps. repeat in 05/2019  . Constipation    IBS  . COPD (chronic obstructive pulmonary disease) (HCC)   . Depression   . GERD (gastroesophageal reflux disease)   . Hyperlipidemia   . Hypertension   . Insomnia   . Migraine headache   . OAB (overactive bladder)   . Osteoarthritis   . Osteoporosis 12/19/2017  . Post-surgical hypothyroidism   . Pulmonary embolism (HCC)    Eliquis to stop 05/03/19  . Scoliosis     Family History  Problem Relation Age of Onset  . Breast cancer Mother   . Arthritis Mother   . Hearing loss Mother   . Hyperlipidemia Mother   . Hypertension Mother   . Anxiety disorder Mother   . Lung cancer Maternal Uncle   . Anxiety disorder Maternal Uncle   .  Lung cancer Maternal Grandmother   . Heart attack Maternal Grandmother   . Anxiety disorder Maternal Grandmother   . Asthma Father   . COPD Father   . Hyperlipidemia Father   . Hypertension Father   . Alcohol abuse Father   . Anxiety disorder Sister   . Hypertension Sister   . Thyroid cancer Sister   . Anxiety disorder Brother   . Hypertension Brother   . Drug abuse Son   . Anxiety disorder Sister   . Hypertension Sister   . Anxiety disorder Sister   . Hypertension Sister   . Hyperlipidemia Son     Social History   Socioeconomic History  . Marital status: Married    Spouse name: Not on file  . Number of children: Not on file  . Years of education: Not on file  . Highest education level: Not on file  Occupational History  . Occupation: disabled due "my nerves"  Social Needs  . Financial resource strain: Not on file  . Food insecurity    Worry: Not on file    Inability: Not on file  . Transportation needs    Medical: Not on file    Non-medical: Not on file  Tobacco Use  . Smoking status: Current Every Day Smoker    Packs/day:  0.50    Years: 44.00    Pack years: 22.00    Types: Cigarettes  . Smokeless tobacco: Never Used  Substance and Sexual Activity  . Alcohol use: No  . Drug use: No  . Sexual activity: Not on file  Lifestyle  . Physical activity    Days per week: Not on file    Minutes per session: Not on file  . Stress: Not on file  Relationships  . Social Musician on phone: Not on file    Gets together: Not on file    Attends religious service: Not on file    Active member of club or organization: Not on file    Attends meetings of clubs or organizations: Not on file    Relationship status: Not on file  . Intimate partner violence    Fear of current or ex partner: Not on file    Emotionally abused: Not on file    Physically abused: Not on file    Forced sexual activity: Not on file  Other Topics Concern  . Not on file  Social History Narrative  . Not on file    Past Medical History, Surgical history, Social history, and Family history were reviewed and updated as appropriate.   Please see review of systems for further details on the patient's review from today.   Objective:   Physical Exam:  There were no vitals taken for this visit.  Physical Exam Neurological:     Mental Status: She is alert and oriented to person, place, and time.     Cranial Nerves: No dysarthria.  Psychiatric:        Attention and Perception: She is inattentive.        Mood and Affect: Mood is anxious.        Speech: Speech normal.        Behavior: Behavior is agitated.        Thought Content: Thought content normal. Thought content is not paranoid or delusional. Thought content does not include homicidal or suicidal ideation. Thought content does not include homicidal or suicidal plan.        Cognition and Memory: Cognition  and memory normal.        Judgment: Judgment normal.     Comments: Insight fair     Lab Review:     Component Value Date/Time   NA 137 04/13/2019 0915   NA 140  10/02/2017 1700   K 4.7 04/13/2019 0915   CL 101 04/13/2019 0915   CO2 24 04/13/2019 0915   GLUCOSE 100 (H) 04/13/2019 0915   BUN 17 04/13/2019 0915   BUN 11 10/02/2017 1700   CREATININE 1.04 (H) 04/13/2019 0915   CREATININE 0.98 05/18/2017 1426   CALCIUM 10.4 (H) 04/13/2019 0915   PROT 6.9 05/04/2018 1129   ALBUMIN 4.5 05/04/2018 1129   AST 15 05/04/2018 1129   ALT 9 05/04/2018 1129   ALKPHOS 64 05/04/2018 1129   BILITOT 0.2 05/04/2018 1129   GFRNONAA 56 (L) 04/13/2019 0915   GFRNONAA 88 05/10/2017 1023   GFRAA >60 04/13/2019 0915   GFRAA 102 05/10/2017 1023       Component Value Date/Time   WBC 6.5 04/13/2019 0915   RBC 3.83 (L) 04/13/2019 0915   HGB 12.4 04/13/2019 0915   HGB 11.6 08/28/2017 1142   HCT 39.6 04/13/2019 0915   HCT 33.2 (L) 08/28/2017 1142   PLT 93 (L) 04/13/2019 0915   PLT 222 08/28/2017 1142   MCV 103.4 (H) 04/13/2019 0915   MCV 88 08/28/2017 1142   MCH 32.4 04/13/2019 0915   MCHC 31.3 04/13/2019 0915   RDW 13.5 04/13/2019 0915   RDW 13.4 08/28/2017 1142   LYMPHSABS 1.7 04/13/2019 0915   LYMPHSABS 1.7 08/28/2017 1142   MONOABS 0.4 04/13/2019 0915   EOSABS 0.5 04/13/2019 0915   EOSABS 0.1 08/28/2017 1142   BASOSABS 0.0 04/13/2019 0915   BASOSABS 0.0 08/28/2017 1142    No results found for: POCLITH, LITHIUM   No results found for: PHENYTOIN, PHENOBARB, VALPROATE, CBMZ   .res Assessment: Plan:   Patient seen for 40 minutes and greater than 50% of visit spent counseling patient and coordination of care, to include discussing possible treatment options for recent agitation, irritability, increased anxiety, and sleep disturbance.  Discussed potential benefits, risks, and side effects of Risperdal and discussed that it is in the same class of medications as olanzapine which was helpful for her mood and anxiety signs and symptoms but was stopped due to weight gain. Patient request to continue Remeron 45 mg at bedtime since she reports worsening  insomnia and depression with dose decreases in the past. Will continue diazepam at current dose. Recommended follow-up in 1 to 2 months, however patient reports that she prefers to follow-up in 5 to 6 months due to financial constraints and agrees to contact office if she experiences any side effects or worsening signs and symptoms. Patient advised to contact office with any questions, adverse effects, or acute worsening in signs and symptoms.  Laurine was seen today for anxiety, other and insomnia.  Diagnoses and all orders for this visit:  Episodic mood disorder (HCC) -     risperiDONE (RISPERDAL) 0.5 MG tablet; Take 1 tablet (0.5 mg total) by mouth at bedtime. -     mirtazapine (REMERON) 45 MG tablet; Take 1 tablet (45 mg total) by mouth at bedtime.  Primary insomnia -     diazepam (VALIUM) 5 MG tablet; Take 1 tablet in the am, 1/2 tablet at lunch and 1 tablet at night -     mirtazapine (REMERON) 45 MG tablet; Take 1 tablet (45 mg total) by mouth at bedtime.  Generalized anxiety disorder -     diazepam (VALIUM) 5 MG tablet; Take 1 tablet in the am, 1/2 tablet at lunch and 1 tablet at night -     mirtazapine (REMERON) 45 MG tablet; Take 1 tablet (45 mg total) by mouth at bedtime.    Please see After Visit Summary for patient specific instructions.  No future appointments.  No orders of the defined types were placed in this encounter.     -------------------------------

## 2019-06-13 ENCOUNTER — Ambulatory Visit: Payer: PPO | Admitting: Psychiatry

## 2019-06-17 ENCOUNTER — Other Ambulatory Visit: Payer: Self-pay | Admitting: Family Medicine

## 2019-06-19 ENCOUNTER — Telehealth: Payer: Self-pay | Admitting: Psychiatry

## 2019-06-19 DIAGNOSIS — F39 Unspecified mood [affective] disorder: Secondary | ICD-10-CM

## 2019-06-19 MED ORDER — RISPERIDONE 0.5 MG PO TABS: 1.0000 mg | ORAL_TABLET | Freq: Every day | ORAL | 2 refills | Status: DC

## 2019-06-19 NOTE — Telephone Encounter (Signed)
I tried calling pt. Again but no answer and unable to leave a message.

## 2019-06-19 NOTE — Telephone Encounter (Signed)
Received telephone message from pt that Risperidone 0.5 mg po QHS is not effective. Will increase dose to Risperidone 0.5 mg two tabs po QHS.

## 2019-06-19 NOTE — Telephone Encounter (Signed)
Left her a VM to return my call.

## 2019-06-20 NOTE — Telephone Encounter (Signed)
Tried calling again today with no success. Unable to leave a VM.

## 2019-06-25 NOTE — Telephone Encounter (Signed)
Tried calling again today with no success. Unable to leave a VM. Please advise.

## 2019-07-23 ENCOUNTER — Other Ambulatory Visit: Payer: Self-pay | Admitting: Family Medicine

## 2019-07-30 ENCOUNTER — Other Ambulatory Visit: Payer: Self-pay

## 2019-07-30 ENCOUNTER — Ambulatory Visit (INDEPENDENT_AMBULATORY_CARE_PROVIDER_SITE_OTHER): Payer: PPO | Admitting: Family Medicine

## 2019-07-30 ENCOUNTER — Encounter: Payer: Self-pay | Admitting: Family Medicine

## 2019-07-30 DIAGNOSIS — M7918 Myalgia, other site: Secondary | ICD-10-CM | POA: Diagnosis not present

## 2019-07-30 MED ORDER — CYCLOBENZAPRINE HCL 10 MG PO TABS: 5.0000 mg | ORAL_TABLET | Freq: Three times a day (TID) | ORAL | 0 refills | Status: DC | PRN

## 2019-07-30 MED ORDER — PREDNISONE 20 MG PO TABS: 40.0000 mg | ORAL_TABLET | Freq: Every day | ORAL | 0 refills | Status: AC

## 2019-07-30 NOTE — Progress Notes (Addendum)
Musculoskeletal Exam  Patient: Brenda Dunn DOB: 1950/11/09  DOS: 07/30/2019  SUBJECTIVE:  Chief Complaint:   Chief Complaint  Patient presents with  . Muscle Pain    [name] is a 68 y.o.  female for evaluation and treatment of buttock pain. Due to COVID-19 pandemic, we are interacting via telephone. I verified patient's ID using 2 identifiers. Patient agreed to proceed with visit via this method. Patient is at home, I am at office. Patient and I are present for visit.    Onset:  3 days ago. Lifted something during Xmas.  Location: L buttock/prox hamstring area Character:  aching and sharp  Progression of issue:  is unchanged Associated symptoms: decreased ROM, no bruising, swelling, redness, weakness Treatment: to date has been rest, ice, heat, ibuprofen.   Neurovascular symptoms: no  ROS: Musculoskeletal/Extremities: +buttock pain  Past Medical History:  Diagnosis Date  . Anxiety   . Benzodiazepine dependence ([place])   . Colonic polyp    mulitple polyps. repeat in 05/2019  . Constipation    IBS  . COPD (chronic obstructive pulmonary disease) ([place])   . Depression   . GERD (gastroesophageal reflux disease)   . Hyperlipidemia   . Hypertension   . Insomnia   . Migraine headache   . OAB (overactive bladder)   . Osteoarthritis   . Osteoporosis 12/19/2017  . Post-surgical hypothyroidism   . Pulmonary embolism ([place])    Eliquis to stop 05/03/19  . Scoliosis     Objective: No conversational dyspnea Age appropriate judgment and insight Nml affect and mood  Assessment:  Buttock pain - Plan: cyclobenzaprine (FLEXERIL) 10 MG tablet, predniSONE (DELTASONE) 20 MG tablet  Plan: Heat, ice, Tylenol.  F/u prn. The patient voiced understanding and agreement to the plan.   [place], DO 07/30/19  2:54 PM

## 2019-07-30 NOTE — Addendum Note (Signed)
Addended by: Ames Coupe on: 07/30/2019 02:55 PM   Modules accepted: Orders, Level of Service

## 2019-08-01 ENCOUNTER — Other Ambulatory Visit: Payer: Self-pay | Admitting: Family Medicine

## 2019-08-05 ENCOUNTER — Ambulatory Visit (INDEPENDENT_AMBULATORY_CARE_PROVIDER_SITE_OTHER): Payer: PPO | Admitting: Family Medicine

## 2019-08-05 ENCOUNTER — Encounter: Payer: Self-pay | Admitting: Family Medicine

## 2019-08-05 ENCOUNTER — Other Ambulatory Visit: Payer: Self-pay

## 2019-08-05 DIAGNOSIS — L989 Disorder of the skin and subcutaneous tissue, unspecified: Secondary | ICD-10-CM | POA: Diagnosis not present

## 2019-08-05 DIAGNOSIS — M7918 Myalgia, other site: Secondary | ICD-10-CM | POA: Diagnosis not present

## 2019-08-05 MED ORDER — CYCLOBENZAPRINE HCL 10 MG PO TABS: 5.0000 mg | ORAL_TABLET | Freq: Three times a day (TID) | ORAL | 0 refills | Status: DC | PRN

## 2019-08-05 MED ORDER — PREDNISONE 20 MG PO TABS: 40.0000 mg | ORAL_TABLET | Freq: Every day | ORAL | 0 refills | Status: AC

## 2019-08-05 MED ORDER — OMEPRAZOLE 40 MG PO CPDR: 40.0000 mg | DELAYED_RELEASE_CAPSULE | Freq: Every day | ORAL | 3 refills | Status: DC

## 2019-08-05 NOTE — Progress Notes (Signed)
Musculoskeletal Exam  Patient: Brenda Dunn DOB: 08/20/1950  DOS: 08/05/2019  SUBJECTIVE:  Chief Complaint:   Chief Complaint  Patient presents with  . Follow-up    refill    JAYDAN CHRETIEN is a 69 y.o.  female for f/u buttock pain. Due to COVID-19 pandemic, we are interacting via telephone. I verified patient's ID using 2 identifiers. Patient agreed to proceed with visit via this method. Patient is at home, I am at office. Patient and I are present for visit.    F/u L buttock pain. Was using ice, heat, pred, and msc relaxer. Helped, would like refill to carry her thru. No new inj or bruising.  Does have a new skin lesion on outer L buttock from keeping ice on it for too long. Using Vaseline. It has scabbed. No drainage, redness, fevers, excessive pain.   ROS: Musculoskeletal/Extremities: +L buttock pain  Past Medical History:  Diagnosis Date  . Anxiety   . Benzodiazepine dependence (HCC)   . Colonic polyp    mulitple polyps. repeat in 05/2019  . Constipation    IBS  . COPD (chronic obstructive pulmonary disease) (HCC)   . Depression   . GERD (gastroesophageal reflux disease)   . Hyperlipidemia   . Hypertension   . Insomnia   . Migraine headache   . OAB (overactive bladder)   . Osteoarthritis   . Osteoporosis 12/19/2017  . Post-surgical hypothyroidism   . Pulmonary embolism (HCC)    Eliquis to stop 05/03/19  . Scoliosis     Objective: No conversational dyspnea Age appropriate judgment and insight Nml affect and mood    Assessment:  Buttock pain - Plan: cyclobenzaprine (FLEXERIL) 10 MG tablet, predniSONE (DELTASONE) 20 MG tablet  Skin lesion  Plan: 1- Refill as above. If no better, will need to be seen in office. Cont ice/heat. Tylenol.  2- TAO bid. Warnings discussed.  F/u prn, will need to be in the office.  Total time: 11 min. The patient voiced understanding and agreement to the plan.   Jilda Roche [place], DO 08/05/19  10:59 AM

## 2019-08-14 ENCOUNTER — Encounter: Payer: Self-pay | Admitting: Family Medicine

## 2019-08-28 ENCOUNTER — Other Ambulatory Visit: Payer: Self-pay | Admitting: Family Medicine

## 2019-08-29 ENCOUNTER — Telehealth: Payer: Self-pay | Admitting: *Deleted

## 2019-08-29 NOTE — Telephone Encounter (Signed)
Called and left a message for the patient that the [facility] at [facility] sent over a request for diclofenac sodium 1% gel and I stated that the patient could get that over the counter and to call the office if any concerns or questions at [phone]. [name]

## 2019-08-30 ENCOUNTER — Other Ambulatory Visit: Payer: Self-pay | Admitting: Psychiatry

## 2019-08-30 DIAGNOSIS — F39 Unspecified mood [affective] disorder: Secondary | ICD-10-CM

## 2019-08-30 NOTE — Telephone Encounter (Signed)
Left patient message to verify dose but will submit so she doesn't run out over the weekend

## 2019-09-05 ENCOUNTER — Telehealth: Payer: Self-pay | Admitting: Family Medicine

## 2019-09-05 NOTE — Telephone Encounter (Signed)
Patient called in states that she would like to speak with Robin or Dr. Carmelia Roller. Patient was informed that Robin/Wednling  wasn't in today. Asked patient if I could do anything for her , she states that she has breathing problems. I sent patient over to triage nurse for further assistance.

## 2019-09-06 ENCOUNTER — Encounter: Payer: Self-pay | Admitting: Family Medicine

## 2019-09-06 ENCOUNTER — Other Ambulatory Visit: Payer: Self-pay

## 2019-09-06 ENCOUNTER — Ambulatory Visit (INDEPENDENT_AMBULATORY_CARE_PROVIDER_SITE_OTHER): Payer: PPO | Admitting: Family Medicine

## 2019-09-06 DIAGNOSIS — E890 Postprocedural hypothyroidism: Secondary | ICD-10-CM | POA: Diagnosis not present

## 2019-09-06 DIAGNOSIS — J40 Bronchitis, not specified as acute or chronic: Secondary | ICD-10-CM

## 2019-09-06 MED ORDER — METHYLPREDNISOLONE 4 MG PO TBPK: ORAL_TABLET | ORAL | 0 refills | Status: DC

## 2019-09-06 NOTE — Telephone Encounter (Signed)
Patient spoke with triage nurse (see below).  Patient reports increased SOB and cough when moving around x 2-3 days.  Cough and phlegm production has improved today, performed neb treatment last night.  States she has used inhalers more frequently.   Patient requesting prednisone/antibiotic treatment, declines virtual visit.  Last appointment for COPD exacerbation was 04/2019.    [name] - Client TELEPHONE ADVICE RECORD AccessNurse Patient Name: Brenda Dunn Gender: Female DOB: 1951-03-12 Age: 69 Y 10 M 29 D Return Phone Number: [phone] (Primary) Address: City/State/Zip: [place] [address] Client [facility] - Client Client Site  [facility] - Day Physician Carmelia Roller, - MD Contact Type Call Who Is Calling Patient / Member / Family / Caregiver Call Type Triage / Clinical Relationship To Patient Self Return Phone Number [phone] (Primary) Chief Complaint BREATHING - shortness of breath or sounds breathless Reason for Call Symptomatic / Request for Health Information Initial Comment Caller states that for last 2-3 days she's been coughing. Caller is a smoker. Has used rescue inhaler, Symbicort and Spiriva. When she moves around she gets tired and has shortness of breath. Pulse is 87-93. Also has itchy throat and post nasal drip. No issues at night when sleeping. O2 is 94 on pulse oximeter. Translation No Nurse Assessment Nurse: Fransisco Hertz, RN, Elnita Maxwell Date/Time Lamount Cohen Time): 09/05/2019 2:30:22 PM Confirm and document reason for call. If symptomatic, describe symptoms. ---Caller states that for last 2-3 days she's been coughing. Caller is a smoker. She has used rescue inhaler, Symbacort and Spiriva. When she moves around she gets tired and has shortness of breath. Pulse is 87-93. Also has itchy throat and post nasal drip. No issues at night when sleeping. O2 is 94% on pulse oximeter. Has the  patient had close contact with a person known or suspected to have the novel coronavirus illness OR traveled / lives in area with major community spread (including international travel) in the last 14 days from the onset of symptoms? * If Asymptomatic, screen for exposure and travel within the last 14 days. ---No Does the patient have any new or worsening symptoms? ---Yes Will a triage be completed? ---Yes Related visit to physician within the last 2 weeks? ---No Does the PT have any chronic conditions? (i.e. diabetes, asthma, this includes High risk factors for pregnancy, etc.) ---Yes List chronic conditions. ---hypertension, COPD, asthma Is this a behavioral health or substance abuse call? ---No PLEASE NOTE: All timestamps contained within this report are represented as [place] Standard Time. CONFIDENTIALTY NOTICE: This fax transmission is intended only for the addressee. It contains information that is legally privileged, confidential or otherwise protected from use or disclosure. If you are not the intended recipient, you are strictly prohibited from reviewing, disclosing, copying using or disseminating any of this information or taking any action in reliance on or regarding this information. If you have received this fax in error, please notify us immediately by telephone so that we can arrange for its return to [place]. Phone: [phone], Toll-Free: [phone], Fax: [phone] Page: 2 of 2 Call Id: [id number] Guidelines Guideline Title Affirmed Question Affirmed Notes Nurse Date/Time Lamount Cohen Time) COPD Oxygen Monitoring and Hypoxia [1] MILD difficulty breathing (e.g., minimal/ no SOB at rest, SOB with walking) AND [2] worse than normal Wendie Chess 09/05/2019 2:35:16 PM Disp. Time Lamount Cohen Time) Disposition Final User 09/05/2019 2:29:24 PM Send to Urgent Queue Thurman Coyer 09/05/2019 2:53:57 PM See PCP within 24 Hours Yes Fransisco Hertz, Charity fundraiser,  Erskine Speed Disagree/Comply  Comply Caller Understands Yes PreDisposition Did not know what to do Care Advice Given Per Guideline SEE PCP WITHIN 24 HOURS: * Here is some general advice about things that you can do to treat your COPD: COPD - TREATMENT - GENERAL ADVICE. * Stop smoking. COPD gets worse over time. Stopping smoking slows down the progression of the disease. Continuing to smoke causes ongoing lung damage and make COPD worse. CALL BACK IF: * You become worse. SEE PCP WITHIN 24 HOURS: COPD - TREATMENT - YOUR DOCTOR'S INSTRUCTIONS: * You should closely follow all instructions (verbal and written) that your doctor gave you. * Take all prescribed medicines as directed. * Repeat pulse oximetry if abnormal. Check probe placement. CALL BACK IF: * Fever higher than 100.4 F (38.0 C) * You become worse. Comments User: [name], RN Date/Time Eilene Ghazi Time): 09/05/2019 2:38:19 PM SPO2 92% PR 88 User: [name], RN Date/Time Eilene Ghazi Time): 09/05/2019 2:42:07 PM Using rescue inhaler more than every 4 hours. User: [name], RN Date/Time Eilene Ghazi Time): 09/05/2019 2:55:36 PM Caller is having SOB when ambulating. She can walk at [facility] if she does NOT wear a mask and does not have trouble. Wants prednisone. Will use nebulizer and call back if worse. Referrals REFERRED TO PCP OFFICE

## 2019-09-06 NOTE — Telephone Encounter (Signed)
Scheduled

## 2019-09-06 NOTE — Telephone Encounter (Signed)
Sched her for [address], virtual. Ty.

## 2019-09-06 NOTE — Progress Notes (Signed)
CC: cough  Brenda Dunn here for URI complaints. Due to COVID-19 pandemic, we are interacting via web portal for an electronic face-to-face visit. I verified patient's ID using 2 identifiers. Patient agreed to proceed with visit via this method. Patient is at home, I am at office. Patient and I are present for visit.   Duration: 2 days  Associated symptoms: wheezing, productive cough, sob, chest tightness Flared up by a gust of wind while being outside.  Denies: sinus pain, itchy watery eyes, ear pain, ear drainage and sore throat, fevers Treatment to date: Neb tx Sick contacts: No   Hypothyroidism Patient presents for follow-up of hypothyroidism.  Reports compliance with medication- Synthroid 88 mcg/d. Current symptoms include: weight gain Denies: denies fatigue, heat/cold intolerance, bowel/skin changes or CVS symptoms She believes her dose should be increased   ROS:  Const: Denies fevers HEENT: As noted in HPI Lungs: +cough  Past Medical History:  Diagnosis Date  . Anxiety   . Benzodiazepine dependence (HCC)   . Colonic polyp    mulitple polyps. repeat in 05/2019  . Constipation    IBS  . COPD (chronic obstructive pulmonary disease) (HCC)   . Depression   . GERD (gastroesophageal reflux disease)   . Hyperlipidemia   . Hypertension   . Insomnia   . Migraine headache   . OAB (overactive bladder)   . Osteoarthritis   . Osteoporosis 12/19/2017  . Post-surgical hypothyroidism   . Pulmonary embolism (HCC)    Eliquis to stop 05/03/19  . Scoliosis    Exam No conversational dyspnea Age appropriate judgment and insight Nml affect and mood  Wheezy bronchitis - Plan: methylPREDNISolone (MEDROL DOSEPAK) 4 MG TBPK tablet  Postoperative hypothyroidism - Plan: TSH, T4, free  Orders as above. Continue to push fluids, practice good hand hygiene, cover mouth when coughing. F/u prn. If starting to experience fevers, shaking, or shortness of breath, seek immediate care. Total  time: 12 min Pt voiced understanding and agreement to the plan.  Jilda Roche [place], DO 09/06/19 3:34 PM

## 2019-09-11 ENCOUNTER — Telehealth: Payer: Self-pay | Admitting: Podiatry

## 2019-09-11 NOTE — Telephone Encounter (Signed)
Yes, that is OK. 

## 2019-09-11 NOTE — Telephone Encounter (Signed)
Pt called wanted to know if Dr.Wagoner would refill  her diclofenac sodium

## 2019-09-11 NOTE — Telephone Encounter (Signed)
Hey Dr Ardelle Anton, is it ok to refill medicine for the patient. Please Advise Brenda Dunn

## 2019-09-13 ENCOUNTER — Other Ambulatory Visit: Payer: Self-pay | Admitting: *Deleted

## 2019-09-13 NOTE — Telephone Encounter (Signed)
Called patient and the patient went to the pharmacy and got the voltaren gel 1 % over the counter and patient stated that it does help and seems to have some cramping on the right foot near the ankle like charlie horses and I stated if that continues may need to go to the medical doctor or come back to see [place] and we can check it out and to call if any concerns or questions. [name]

## 2019-09-13 NOTE — Telephone Encounter (Signed)
Patient called and stated that she went and got the voltaren gel 1% gel over the counter and it has helped a lot and patient stated that she does better in the sock feet than regular shoes and the right foot/ankle area does seem to get charlie horses and medical doctor stated to get mustard or pickle juice and that seems to help and I stated that if it got worse or no change then patient needs to be seen by Dr Ardelle Anton or the medical doctor and patient understood and patient also stated that Dr Ardelle Anton was a really good doctor and like him very much and I stated to call the [place] office if any concerns or questions at [phone]. [name]

## 2019-09-23 ENCOUNTER — Other Ambulatory Visit: Payer: Self-pay | Admitting: Family Medicine

## 2019-09-23 ENCOUNTER — Encounter: Payer: Self-pay | Admitting: Family

## 2019-09-23 ENCOUNTER — Ambulatory Visit (INDEPENDENT_AMBULATORY_CARE_PROVIDER_SITE_OTHER): Payer: PPO | Admitting: Family

## 2019-09-23 ENCOUNTER — Other Ambulatory Visit: Payer: Self-pay

## 2019-09-23 VITALS — HR 81

## 2019-09-23 DIAGNOSIS — J441 Chronic obstructive pulmonary disease with (acute) exacerbation: Secondary | ICD-10-CM

## 2019-09-23 DIAGNOSIS — J302 Other seasonal allergic rhinitis: Secondary | ICD-10-CM

## 2019-09-23 MED ORDER — PREDNISONE 10 MG PO TABS: ORAL_TABLET | ORAL | 0 refills | Status: DC

## 2019-09-23 MED ORDER — AZITHROMYCIN 250 MG PO TABS: ORAL_TABLET | ORAL | 0 refills | Status: DC

## 2019-09-23 NOTE — Progress Notes (Signed)
Virtual Visit via Video Note  I connected with Brenda Dunn on 09/23/19 at 10:20 AM EST by a video enabled telemedicine application and verified that I am speaking with the correct person using two identifiers.  Location: Patient: home Provider: work   I discussed the limitations of evaluation and management by telemedicine and the availability of in person appointments. The patient expressed understanding and agreed to proceed.  History of Present Illness:  Patient is a 69 yr old female with hx of COPD (active smoker with 40 yr pack hx) who presents with c/o dry cough, and complaint that breathing is "tight." She attributes cough to allergies. Reports that she has been waking up with a "hacking cough." This is keeping her up at night. Using symbicort, rescue inhaler, nebulizer. She denies associated fever or any other symptoms.  She reports + wheezing. Reports brief improvement with use of albuterol. She denies known covid exposure.  She states that she never wears a mask when in public and has no plans to get the covid vaccine.   Of note, she saw Dr. Carmelia Roller on 09/06/19 for similar symptoms. She was treated with a medrol dose pak.   Past Medical History:  Diagnosis Date  . Anxiety   . Benzodiazepine dependence (HCC)   . Colonic polyp    mulitple polyps. repeat in 05/2019  . Constipation    IBS  . COPD (chronic obstructive pulmonary disease) (HCC)   . Depression   . GERD (gastroesophageal reflux disease)   . Hyperlipidemia   . Hypertension   . Insomnia   . Migraine headache   . OAB (overactive bladder)   . Osteoarthritis   . Osteoporosis 12/19/2017  . Post-surgical hypothyroidism   . Pulmonary embolism (HCC)    Eliquis to stop 05/03/19  . Scoliosis      Social History   Socioeconomic History  . Marital status: Married    Spouse name: Not on file  . Number of children: Not on file  . Years of education: Not on file  . Highest education level: Not on file  Occupational  History  . Occupation: disabled due "my nerves"  Tobacco Use  . Smoking status: Current Every Day Smoker    Packs/day: 0.50    Years: 44.00    Pack years: 22.00    Types: Cigarettes  . Smokeless tobacco: Never Used  Substance and Sexual Activity  . Alcohol use: No  . Drug use: No  . Sexual activity: Not on file  Other Topics Concern  . Not on file  Social History Narrative  . Not on file   Social Determinants of Health   Financial Resource Strain:   . Difficulty of Paying Living Expenses: Not on file  Food Insecurity:   . Worried About Programme researcher, broadcasting/film/video in the Last Year: Not on file  . Ran Out of Food in the Last Year: Not on file  Transportation Needs:   . Lack of Transportation (Medical): Not on file  . Lack of Transportation (Non-Medical): Not on file  Physical Activity:   . Days of Exercise per Week: Not on file  . Minutes of Exercise per Session: Not on file  Stress:   . Feeling of Stress : Not on file  Social Connections:   . Frequency of Communication with Friends and Family: Not on file  . Frequency of Social Gatherings with Friends and Family: Not on file  . Attends Religious Services: Not on file  . Active Member of  Clubs or Organizations: Not on file  . Attends Banker Meetings: Not on file  . Marital Status: Not on file  Intimate Partner Violence:   . Fear of Current or Ex-Partner: Not on file  . Emotionally Abused: Not on file  . Physically Abused: Not on file  . Sexually Abused: Not on file    Past Surgical History:  Procedure Laterality Date  . BREAST CYST ASPIRATION Left   . HEMORRHOID SURGERY    . TH[facility]ROIDECTOMY     at [facility]  . TUBAL LIGATION  1979    Family History  Problem Relation Age of Onset  . Breast cancer Mother   . Arthritis Mother   . Hearing loss Mother   . Hyperlipidemia Mother   . Hypertension Mother   . Anxiety disorder Mother   . Lung cancer Maternal Uncle   . Anxiety disorder Maternal Uncle   . Lung cancer  Maternal Grandmother   . Heart attack Maternal Grandmother   . Anxiety disorder Maternal Grandmother   . Asthma Father   . COPD Father   . Hyperlipidemia Father   . Hypertension Father   . Alcohol abuse Father   . Anxiety disorder Sister   . Hypertension Sister   . Thyroid cancer Sister   . Anxiety disorder Brother   . Hypertension Brother   . Drug abuse Son   . Anxiety disorder Sister   . Hypertension Sister   . Anxiety disorder Sister   . Hypertension Sister   . Hyperlipidemia Son     No Known Allergies  Current Outpatient Medications on File Prior to Visit  Medication Sig Dispense Refill  . albuterol (VENTOLIN HFA) 108 (90 Base) MCG/ACT inhaler INHALE 2 PUFFS BY MOUTH EVERY 4 HOURS AS NEEDED FOR WHEEZING OR SHORTNESS OF BREATH 18 g 1  . alendronate (FOSAMAX) 70 MG tablet TAKE 1 TABLET EVERY WEEK WITH FULL GLASS OF WATER ON EMPTY STOMACH 12 tablet 3  . atorvastatin (LIPITOR) 40 MG tablet TAKE 1 TABLET BY MOUTH EVERYDAY AT BEDTIME 90 tablet 0  . budesonide-formoterol (SYMBICORT) 80-4.5 MCG/ACT inhaler TAKE 2 PUFFS BY MOUTH TWICE A DAY 3 Inhaler 3  . Calcium Carbonate-Vitamin D (CALTRATE 600+D) 600-400 MG-UNIT tablet Take 1 tablet by mouth 2 (two) times daily. 60 tablet 11  . cyclobenzaprine (FLEXERIL) 10 MG tablet Take 0.5-1 tablets (5-10 mg total) by mouth 3 (three) times daily as needed for muscle spasms. 21 tablet 0  . diazepam (VALIUM) 5 MG tablet Take 1 tablet in the am, 1/2 tablet at lunch and 1 tablet at night 75 tablet 5  . diclofenac sodium (VOLTAREN) 1 % GEL Apply 2 g topically 4 (four) times daily. Rub into affected area of foot 2 to 4 times daily 100 g 2  . Docusate Calcium (STOOL SOFTENER PO) Take 3 tablets by mouth daily.    Marland Kitchen ELIQUIS 5 MG TABS tablet TAKE 1 TABLET BY MOUTH TWICE A DAY 60 tablet 2  . gabapentin (NEURONTIN) 400 MG capsule TAKE 1 CAPSULE TWICE DAILY AND 4 CAPSULES AT BEDTIME 540 capsule 1  . ipratropium-albuterol (DUONEB) 0.5-2.5 (3) MG/3ML SOLN Take  3 mLs by nebulization every 6 (six) hours as needed (wheezing). 360 mL 0  . levocetirizine (XYZAL) 5 MG tablet TAKE 1 TABLET BY MOUTH EVERY DAY IN THE EVENING 90 tablet 1  . levothyroxine (SYNTHROID, LEVOTHROID) 88 MCG tablet Take 1 tablet (88 mcg total) by mouth daily. 30 tablet 3  . LINZESS 290 MCG CAPS capsule  TAKE 1 CAPSULE EVERY DAY ON EMPTY STOMACH AT LEAST 30 MINUTES PRIOR TO 1ST MEAL OF THE DAY 90 capsule 1  . lisinopril (ZESTRIL) 40 MG tablet TAKE 1 TABLET BY MOUTH EVERY DAY 90 tablet 1  . methylPREDNISolone (MEDROL DOSEPAK) 4 MG TBPK tablet Follow instructions on package. 21 tablet 0  . mirtazapine (REMERON) 45 MG tablet Take 1 tablet (45 mg total) by mouth at bedtime. 90 tablet 1  . montelukast (SINGULAIR) 10 MG tablet TAKE 1 TABLET BY MOUTH EVERYDAY AT BEDTIME 90 tablet 1  . MYRBETRIQ 50 MG TB24 tablet TAKE 1 TABLET BY MOUTH EVERY DAY 90 tablet 1  . omeprazole (PRILOSEC) 40 MG capsule Take 1 capsule (40 mg total) by mouth daily. 90 capsule 3  . Probiotic Product (PROBIOTIC DAILY PO) Take 2 tablets by mouth daily.    . promethazine-dextromethorphan (PROMETHAZINE-DM) 6.25-15 MG/5ML syrup Take 5 mLs by mouth 4 (four) times daily as needed. 118 mL 0  . risperiDONE (RISPERDAL) 0.5 MG tablet TAKE 1 TABLET BY MOUTH AT BEDTIME. 30 tablet 2  . tiotropium (SPIRIVA) 18 MCG inhalation capsule Place 1 capsule (18 mcg total) into inhaler and inhale daily. 30 capsule 12   No current facility-administered medications on file prior to visit.    Pulse 81   SpO2 96%        Observations/Objective:   Gen: Awake, alert, no acute distress Resp: Breathing is even and non-labored Psych: calm/pleasant demeanor Neuro: Alert and Oriented x 3, + facial symmetry, speech is clear.   Assessment and Plan:  Acute COPD exacerbation- symptoms most consistent with acute COPD exacerbation.  Will rx with a prednisone taper, empiric rx with zpak.  She is advised to call if symptoms worsen or if she develops  new symptoms and to go to the ER if severe shortness of breath. Continue  I did educate her today that she is at very high risk for complication/death from [id number]-[id number] if she contracts the virus and that masking /immunization would lower this risk.  She is adamantly against these measures.    Follow Up Instructions:    I discussed the assessment and treatment plan with the patient. The patient was provided an opportunity to ask questions and all were answered. The patient agreed with the plan and demonstrated an understanding of the instructions.   The patient was advised to call back or seek an in-person evaluation if the symptoms worsen or if the condition fails to improve as anticipated.  I provided 25 minutes of non-face-to-face time during this encounter.   Nance Pear, NP

## 2019-09-24 ENCOUNTER — Other Ambulatory Visit: Payer: Self-pay | Admitting: Psychiatry

## 2019-09-24 DIAGNOSIS — F39 Unspecified mood [affective] disorder: Secondary | ICD-10-CM

## 2019-09-26 ENCOUNTER — Telehealth: Payer: Self-pay | Admitting: Family Medicine

## 2019-09-26 MED ORDER — BENZONATATE 100 MG PO CAPS: 100.0000 mg | ORAL_CAPSULE | Freq: Two times a day (BID) | ORAL | 0 refills | Status: DC | PRN

## 2019-09-26 NOTE — Telephone Encounter (Signed)
Seen by Efraim Kaufmann on 2/22- PCP out of office for the week. Please advise.

## 2019-09-26 NOTE — Telephone Encounter (Signed)
Patient states that her cough/musle pain hasn't gotten any better, patient is requesting a prescriptions to be called in for muscle spasms/antibotic . Patient states she pulled a muscle on yesterday.

## 2019-09-26 NOTE — Telephone Encounter (Signed)
An Rx was sent on the day of her visit for zpak (antibiotic) and prednisone. Rx sent for tessalon.  Left detailed message for pt that rx was sent for tessalon and that we would contact her tomorrow to follow up. If she is not improved tomorrow, then we should try to get he set up in the respiratory clinic for evaluation.

## 2019-09-27 ENCOUNTER — Other Ambulatory Visit: Payer: Self-pay

## 2019-09-27 ENCOUNTER — Encounter: Payer: Self-pay | Admitting: Internal Medicine

## 2019-09-27 ENCOUNTER — Ambulatory Visit (INDEPENDENT_AMBULATORY_CARE_PROVIDER_SITE_OTHER): Payer: PPO | Admitting: Internal Medicine

## 2019-09-27 VITALS — HR 81 | Ht 60.0 in | Wt 145.0 lb

## 2019-09-27 DIAGNOSIS — M7918 Myalgia, other site: Secondary | ICD-10-CM

## 2019-09-27 MED ORDER — CYCLOBENZAPRINE HCL 10 MG PO TABS: 5.0000 mg | ORAL_TABLET | Freq: Three times a day (TID) | ORAL | 0 refills | Status: DC | PRN

## 2019-09-27 NOTE — Progress Notes (Signed)
Pre visit review using our clinic review tool, if applicable. No additional management support is needed unless otherwise documented below in the visit note. 

## 2019-09-27 NOTE — Progress Notes (Signed)
Subjective:    Patient ID: Brenda Dunn, female    DOB: [birth year], 69 y.o.   MRN: [id number]  DOS:  09/27/2019 Type of visit - description: Virtual Visit via Telephone  Attempted  to make this a video visit, due to technical difficulties from the patient side it was not possible  thus we proceeded with a Virtual Visit via Telephone    I connected with above mentioned patient  by telephone and verified that I am speaking with the correct person using two identifiers.  THIS ENCOUNTER IS A VIRTUAL VISIT DUE TO COVID-19 - PATIENT WAS NOT SEEN IN THE OFFICE. PATIENT HAS CONSENTED TO VIRTUAL VISIT / TELEMEDICINE VISIT   Location of patient: home  Location of provider: office  I discussed the limitations, risks, security and privacy concerns of performing an evaluation and management service by telephone and the availability of in person appointments. I also discussed with the patient that there may be a patient responsible charge related to this service. The patient expressed understanding and agreed to proceed.  Acute Yesterday, the patient had to move the trash can all the way to the street. Immediately after she finished, developed pain at the left buttock, mild radiation to the waist but no radiation to the leg.  Denies any abdominal pain.  No ecchymosis anywhere. No numbness or tingling at the lower extremities. Had a similar episode back in December, responded very well to Flexeril and prednisone.  Request the same prescriptions.    Review of Systems See above   Past Medical History:  Diagnosis Date  . Anxiety   . Benzodiazepine dependence (HCC)   . Colonic polyp    mulitple polyps. repeat in 05/2019  . Constipation    IBS  . COPD (chronic obstructive pulmonary disease) (HCC)   . Depression   . GERD (gastroesophageal reflux disease)   . Hyperlipidemia   . Hypertension   . Insomnia   . Migraine headache   . OAB (overactive bladder)   . Osteoarthritis   . Osteoporosis  12/19/2017  . Post-surgical hypothyroidism   . Pulmonary embolism (HCC)    Eliquis to stop 05/03/19  . Scoliosis     Past Surgical History:  Procedure Laterality Date  . BREAST CYST ASPIRATION Left   . HEMORRHOID SURGERY    . [facility]ECTOMY     at [facility]  . TUBAL LIGATION  1979    Allergies as of 09/27/2019   No Known Allergies     Medication List       Accurate as of September 27, 2019  1:01 PM. If you have any questions, ask your nurse or doctor.        albuterol 108 (90 Base) MCG/ACT inhaler Commonly known as: VENTOLIN HFA INHALE 2 PUFFS BY MOUTH EVERY 4 HOURS AS NEEDED FOR WHEEZING OR SHORTNESS OF BREATH   alendronate 70 MG tablet Commonly known as: FOSAMAX TAKE 1 TABLET EVERY WEEK WITH FULL GLASS OF WATER ON EMPTY STOMACH   atorvastatin 40 MG tablet Commonly known as: LIPITOR TAKE 1 TABLET BY MOUTH EVERYDAY AT BEDTIME   azithromycin 250 MG tablet Commonly known as: ZITHROMAX Take 2 tabs by mouth today, then one tab once daily for 4 more days   benzonatate 100 MG capsule Commonly known as: TESSALON Take 1 capsule (100 mg total) by mouth 2 (two) times daily as needed for cough.   budesonide-formoterol 80-4.5 MCG/ACT inhaler Commonly known as: Symbicort TAKE 2 PUFFS BY MOUTH TWICE A DAY  Calcium Carbonate-Vitamin D 600-400 MG-UNIT tablet Commonly known as: Caltrate 600+D Take 1 tablet by mouth 2 (two) times daily.   cyclobenzaprine 10 MG tablet Commonly known as: FLEXERIL Take 0.5-1 tablets (5-10 mg total) by mouth 3 (three) times daily as needed for muscle spasms.   diazepam 5 MG tablet Commonly known as: VALIUM Take 1 tablet in the am, 1/2 tablet at lunch and 1 tablet at night   diclofenac sodium 1 % Gel Commonly known as: VOLTAREN Apply 2 g topically 4 (four) times daily. Rub into affected area of foot 2 to 4 times daily   Eliquis 5 MG Tabs tablet Generic drug: apixaban TAKE 1 TABLET BY MOUTH TWICE A DAY   gabapentin 400 MG capsule Commonly known  as: NEURONTIN TAKE 1 CAPSULE TWICE DAILY AND 4 CAPSULES AT BEDTIME   ipratropium-albuterol 0.5-2.5 (3) MG/3ML Soln Commonly known as: DUONEB Take 3 mLs by nebulization every 6 (six) hours as needed (wheezing).   levocetirizine 5 MG tablet Commonly known as: XYZAL TAKE 1 TABLET BY MOUTH EVERY DAY IN THE EVENING   levothyroxine 88 MCG tablet Commonly known as: SYNTHROID Take 1 tablet (88 mcg total) by mouth daily.   Linzess 290 MCG Caps capsule Generic drug: linaclotide TAKE 1 CAPSULE EVERY DAY ON EMPTY STOMACH AT LEAST 30 MINUTES PRIOR TO 1ST MEAL OF THE DAY   lisinopril 40 MG tablet Commonly known as: ZESTRIL TAKE 1 TABLET BY MOUTH EVERY DAY   mirtazapine 45 MG tablet Commonly known as: REMERON Take 1 tablet (45 mg total) by mouth at bedtime.   montelukast 10 MG tablet Commonly known as: SINGULAIR TAKE 1 TABLET BY MOUTH EVERYDAY AT BEDTIME   Myrbetriq 50 MG Tb24 tablet Generic drug: mirabegron ER TAKE 1 TABLET BY MOUTH EVERY DAY   omeprazole 40 MG capsule Commonly known as: PRILOSEC Take 1 capsule (40 mg total) by mouth daily.   predniSONE 10 MG tablet Commonly known as: DELTASONE 4 tabs by mouth once daily for 2 days, then 3 tabs daily x 2 days, then 2 tabs daily x 2 days, then 1 tab daily x 2 days   PROBIOTIC DAILY PO Take 2 tablets by mouth daily.   promethazine-dextromethorphan 6.25-15 MG/5ML syrup Commonly known as: PROMETHAZINE-DM Take 5 mLs by mouth 4 (four) times daily as needed.   risperiDONE 0.5 MG tablet Commonly known as: RISPERDAL TAKE 1 TABLET BY MOUTH EVERYDAY AT BEDTIME   STOOL SOFTENER PO Take 3 tablets by mouth daily.   tiotropium 18 MCG inhalation capsule Commonly known as: SPIRIVA Place 1 capsule (18 mcg total) into inhaler and inhale daily.             Objective:   Physical Exam Pulse 81   Ht 5' (1.524 m)   Wt 145 lb (65.8 kg)   SpO2 97%   BMI 28.32 kg/m  This is a telephone virtual visit, she is alert oriented x3, in no  distress.    Assessment      69 year old female, PMH includes HTN, COPD, osteoporosis, DJD, mood disorder, history of PE, currently not anticoagulated, presents with  Acute pain, left buttock: The patient knows the limitations of a virtual visit, reports left buttock pain without radiation. Similar symptoms in December responded well to Flexeril and prednisone. I agree to refill a round the Flexeril, she is to continue with Tylenol & ice pack. She is just finishing prednisone so I recommend against send another round of steroids. I made her aware that if she is  not better she will need to be seen, she is at risk for fragility fractures. She seemed to understand   I discussed the assessment and treatment plan with the patient. The patient was provided an opportunity to ask questions and all were answered. The patient agreed with the plan and demonstrated an understanding of the instructions.   The patient was advised to call back or seek an in-person evaluation if the symptoms worsen or if the condition fails to improve as anticipated.  I provided 15 minutes of non-face-to-face time during this encounter.  Willow Ora, MD

## 2019-10-04 ENCOUNTER — Other Ambulatory Visit: Payer: Self-pay | Admitting: Family Medicine

## 2019-10-04 DIAGNOSIS — R252 Cramp and spasm: Secondary | ICD-10-CM

## 2019-10-09 ENCOUNTER — Other Ambulatory Visit (INDEPENDENT_AMBULATORY_CARE_PROVIDER_SITE_OTHER): Payer: PPO

## 2019-10-09 ENCOUNTER — Other Ambulatory Visit: Payer: Self-pay

## 2019-10-09 DIAGNOSIS — R252 Cramp and spasm: Secondary | ICD-10-CM | POA: Diagnosis not present

## 2019-10-09 DIAGNOSIS — E890 Postprocedural hypothyroidism: Secondary | ICD-10-CM

## 2019-10-09 LAB — BASIC METABOLIC PANEL WITH GFR
BUN: 17 mg/dL (ref 6–23)
CO2: 26 meq/L (ref 19–32)
Calcium: 10.4 mg/dL (ref 8.4–10.5)
Chloride: 102 meq/L (ref 96–112)
Creatinine, Ser: 1.03 mg/dL (ref 0.40–1.20)
GFR: 53.29 mL/min — ABNORMAL LOW
Glucose, Bld: 104 mg/dL — ABNORMAL HIGH (ref 70–99)
Potassium: 4.1 meq/L (ref 3.5–5.1)
Sodium: 135 meq/L (ref 135–145)

## 2019-10-09 LAB — TSH: TSH: 25.54 u[IU]/mL — ABNORMAL HIGH (ref 0.35–4.50)

## 2019-10-09 LAB — T4, FREE: Free T4: 0.84 ng/dL (ref 0.60–1.60)

## 2019-10-09 LAB — MAGNESIUM: Magnesium: 1.7 mg/dL (ref 1.5–2.5)

## 2019-10-10 ENCOUNTER — Other Ambulatory Visit: Payer: Self-pay | Admitting: Family Medicine

## 2019-10-11 ENCOUNTER — Telehealth: Payer: Self-pay | Admitting: Family Medicine

## 2019-10-11 NOTE — Telephone Encounter (Signed)
Allergy med, honey and/or dextromethorphan. Ty.

## 2019-10-11 NOTE — Telephone Encounter (Signed)
The patient stated she was outside a lot yesterday/was windy//she stated coughing, no fever no other symptoms only a cough.  Advise on OTC/?

## 2019-10-11 NOTE — Telephone Encounter (Signed)
Called informed of PCP instructions. 

## 2019-10-14 ENCOUNTER — Other Ambulatory Visit: Payer: Self-pay | Admitting: Psychiatry

## 2019-10-14 DIAGNOSIS — F5101 Primary insomnia: Secondary | ICD-10-CM

## 2019-10-14 DIAGNOSIS — F411 Generalized anxiety disorder: Secondary | ICD-10-CM

## 2019-10-27 ENCOUNTER — Other Ambulatory Visit: Payer: Self-pay | Admitting: Family Medicine

## 2019-11-08 ENCOUNTER — Other Ambulatory Visit: Payer: Self-pay

## 2019-11-08 ENCOUNTER — Ambulatory Visit (INDEPENDENT_AMBULATORY_CARE_PROVIDER_SITE_OTHER): Payer: PPO | Admitting: Family Medicine

## 2019-11-08 ENCOUNTER — Telehealth: Payer: Self-pay

## 2019-11-08 ENCOUNTER — Encounter: Payer: Self-pay | Admitting: Family Medicine

## 2019-11-08 DIAGNOSIS — M549 Dorsalgia, unspecified: Secondary | ICD-10-CM

## 2019-11-08 MED ORDER — CYCLOBENZAPRINE HCL 10 MG PO TABS: 5.0000 mg | ORAL_TABLET | Freq: Three times a day (TID) | ORAL | 0 refills | Status: DC | PRN

## 2019-11-08 NOTE — Telephone Encounter (Signed)
PA approved.   PA Case: [id number], Status: Approved, Coverage Starts on: 11/08/2019 12:00:00 AM, Coverage Ends on: 11/07/2020 12:00:00 AM.

## 2019-11-08 NOTE — Telephone Encounter (Signed)
PA initiated via Covermymeds; KEY: [id number]. Awaiting determination.

## 2019-11-08 NOTE — Progress Notes (Signed)
Musculoskeletal Exam  Patient: Brenda Dunn DOB: 09/13/1950  DOS: 11/08/2019  SUBJECTIVE:  Chief Complaint:   Chief Complaint  Patient presents with  . Back Pain    Complains of pain on "left shoulder blade area"    Brenda Dunn is a 69 y.o.  female for evaluation and treatment of upper back pain. Due to COVID-19 pandemic, we are interacting via telephone. I verified patient's ID using 2 identifiers. Patient agreed to proceed with visit via this method. Patient is at home, I am at office. Patient and I are present for visit.   Onset:  8 days ago. Leaned over sink while washing hair.  Location: L shoulder blade area Character:  sharp  Progression of issue:  is unchanged Associated symptoms: Decreased ROM 2/2 pain; no bruising, swelling, redness Treatment: to date has been ice, acetaminophen and heat.   Neurovascular symptoms: no  Past Medical History:  Diagnosis Date  . Anxiety   . Benzodiazepine dependence (HCC)   . Colonic polyp    mulitple polyps. repeat in 05/2019  . Constipation    IBS  . COPD (chronic obstructive pulmonary disease) (HCC)   . Depression   . GERD (gastroesophageal reflux disease)   . Hyperlipidemia   . Hypertension   . Insomnia   . Migraine headache   . OAB (overactive bladder)   . Osteoarthritis   . Osteoporosis 12/19/2017  . Post-surgical hypothyroidism   . Pulmonary embolism (HCC)    Eliquis to stop 05/03/19  . Scoliosis     Objective: No conversational dyspnea Age appropriate judgment and insight Nml affect and mood  Assessment:  Upper back pain - Plan: cyclobenzaprine (FLEXERIL) 10 MG tablet  Plan: Try to stretch the area. Heat, ice, Tylenol. Add above. She does well with this med w/o AE's.  F/u prn. Total time: 11 min The patient voiced understanding and agreement to the plan.   Brenda Dunn [place], DO 11/08/19  12:55 PM

## 2019-11-19 ENCOUNTER — Ambulatory Visit (INDEPENDENT_AMBULATORY_CARE_PROVIDER_SITE_OTHER): Payer: PPO | Admitting: Psychiatry

## 2019-11-19 ENCOUNTER — Encounter: Payer: Self-pay | Admitting: Psychiatry

## 2019-11-19 DIAGNOSIS — F5101 Primary insomnia: Secondary | ICD-10-CM

## 2019-11-19 DIAGNOSIS — F411 Generalized anxiety disorder: Secondary | ICD-10-CM | POA: Diagnosis not present

## 2019-11-19 DIAGNOSIS — F39 Unspecified mood [affective] disorder: Secondary | ICD-10-CM

## 2019-11-19 MED ORDER — MIRTAZAPINE 45 MG PO TABS: 45.0000 mg | ORAL_TABLET | Freq: Every day | ORAL | 1 refills | Status: DC

## 2019-11-19 MED ORDER — RISPERIDONE 0.5 MG PO TABS: ORAL_TABLET | ORAL | 1 refills | Status: DC

## 2019-11-19 MED ORDER — GABAPENTIN 400 MG PO CAPS: ORAL_CAPSULE | ORAL | 1 refills | Status: DC

## 2019-11-19 MED ORDER — DIAZEPAM 5 MG PO TABS: ORAL_TABLET | ORAL | 5 refills | Status: DC

## 2019-11-19 NOTE — Progress Notes (Signed)
Brenda Dunn [id number] May 27, 1951 69 y.o.  Virtual Visit via Telephone Note  I connected with pt on 11/19/19 at 11:30 AM EDT by telephone and verified that I am speaking with the correct person using two identifiers.   I discussed the limitations, risks, security and privacy concerns of performing an evaluation and management service by telephone and the availability of in person appointments. I also discussed with the patient that there may be a patient responsible charge related to this service. The patient expressed understanding and agreed to proceed.   I discussed the assessment and treatment plan with the patient. The patient was provided an opportunity to ask questions and all were answered. The patient agreed with the plan and demonstrated an understanding of the instructions.   The patient was advised to call back or seek an in-person evaluation if the symptoms worsen or if the condition fails to improve as anticipated.  I provided 30 minutes of non-face-to-face time during this encounter.  The patient was located at home.  The provider was located at [facility]   Corie Chiquito, PMHNP   Subjective:   Patient ID:  Brenda Dunn is a 69 y.o. (DOB 01/12/1951) female.  Chief Complaint:  Chief Complaint  Patient presents with  . Follow-up    Insomnia, mood disturbance, and anxiety    HPI Brenda Dunn presents for follow-up of anxiety, mood disturbance, and insomnia. She reports, "I've been doing pretty good... a lot better than I did back in November."  She reports that she has had some wt gain with Risperdal and has been trying to be more active. She reports that her mood typically improves during the spring and is more depressed in the winter. She reports that the longer days have also allowed her to get out more. Denies current depressed mood. She reports that her anxiety has improved. She reports that she has to awaken nightly to use the bathroom. Going to bed around  10:30 and awakening around 3:30-4 am and is unable to return to sleep. Increased appetite with Risperdal. Energy and motivation have been good. She reports that racing thoughts have improved. Able to rock in her rocking chair when feeling anxious and restless. Denies SI.   She reports that she has difficulty tolerating wearing a face mask. Reports that she wears a sign that says, "Can't mask, please don't ask."   Reports that her granddaughter was buying Xanax off the street and that it was laced with Fentanyl. Granddaughter then stopped pills and had acute withdrawal and came to stay with them for awhile.    Past Psychiatric Medication Trials: Remeron- Had worsening depression and insomnia with 30 mg dose. Olanzapine Seroquel  Diazepam Gabapentin Trazodone Trileptal- hyponatremia Ambien  Review of Systems:  Review of Systems  HENT: Positive for voice change.   Musculoskeletal: Positive for arthralgias and myalgias. Negative for gait problem.       Recently pulled muscle in her shoulder.  Allergic/Immunologic: Positive for environmental allergies.  Neurological: Negative for tremors.  Psychiatric/Behavioral:       Please refer to HPI    Medications: I have reviewed the patient's current medications.  Current Outpatient Medications  Medication Sig Dispense Refill  . acetaminophen (TYLENOL) 325 MG tablet Take 650 mg by mouth every 6 (six) hours as needed.    Marland Kitchen albuterol (VENTOLIN HFA) 108 (90 Base) MCG/ACT inhaler INHALE 2 PUFFS BY MOUTH EVERY 4 HOURS AS NEEDED FOR WHEEZING OR SHORTNESS OF BREATH 8.8 g 1  .  alendronate (FOSAMAX) 70 MG tablet TAKE 1 TABLET EVERY WEEK WITH FULL GLASS OF WATER ON EMPTY STOMACH 12 tablet 3  . atorvastatin (LIPITOR) 40 MG tablet TAKE 1 TABLET BY MOUTH EVERYDAY AT BEDTIME 90 tablet 0  . budesonide-formoterol (SYMBICORT) 80-4.5 MCG/ACT inhaler TAKE 2 PUFFS BY MOUTH TWICE A DAY 3 Inhaler 3  . Calcium Carbonate-Vitamin D (CALTRATE 600+D) 600-400 MG-UNIT  tablet Take 1 tablet by mouth 2 (two) times daily. 60 tablet 11  . cyclobenzaprine (FLEXERIL) 10 MG tablet Take 0.5-1 tablets (5-10 mg total) by mouth 3 (three) times daily as needed for muscle spasms. 21 tablet 0  . [START ON 11/24/2019] diazepam (VALIUM) 5 MG tablet Take 1 tablet in the am, 1/2 tablet at lunch and 1 tablet at night 75 tablet 5  . gabapentin (NEURONTIN) 400 MG capsule TAKE 1 CAPSULE TWICE A DAY AND TAKE 4 CAPSULES AT BEDTIME 540 capsule 1  . ipratropium-albuterol (DUONEB) 0.5-2.5 (3) MG/3ML SOLN Take 3 mLs by nebulization every 6 (six) hours as needed (wheezing). 360 mL 0  . levocetirizine (XYZAL) 5 MG tablet TAKE 1 TABLET BY MOUTH EVERY DAY IN THE EVENING 90 tablet 1  . levothyroxine (SYNTHROID, LEVOTHROID) 88 MCG tablet Take 1 tablet (88 mcg total) by mouth daily. 30 tablet 3  . LINZESS 290 MCG CAPS capsule TAKE 1 CAPSULE EVERY DAY ON EMPTY STOMACH AT LEAST 30 MINUTES PRIOR TO 1ST MEAL OF THE DAY 90 capsule 1  . lisinopril (ZESTRIL) 40 MG tablet TAKE 1 TABLET BY MOUTH EVERY DAY 90 tablet 1  . mirtazapine (REMERON) 45 MG tablet Take 1 tablet (45 mg total) by mouth at bedtime. 90 tablet 1  . montelukast (SINGULAIR) 10 MG tablet TAKE 1 TABLET BY MOUTH EVERYDAY AT BEDTIME 90 tablet 1  . MYRBETRIQ 50 MG TB24 tablet TAKE 1 TABLET BY MOUTH EVERY DAY 90 tablet 1  . omeprazole (PRILOSEC) 40 MG capsule Take 1 capsule (40 mg total) by mouth daily. 90 capsule 3  . Probiotic Product (PROBIOTIC DAILY PO) Take 2 tablets by mouth daily.    Marland Kitchen tiotropium (SPIRIVA) 18 MCG inhalation capsule Place 1 capsule (18 mcg total) into inhaler and inhale daily. 30 capsule 12  . Docusate Calcium (STOOL SOFTENER PO) Take 3 tablets by mouth daily.    . risperiDONE (RISPERDAL) 0.5 MG tablet TAKE 1 TABLET BY MOUTH EVERYDAY AT BEDTIME 90 tablet 1   No current facility-administered medications for this visit.    Medication Side Effects: Other: Some increase in appetite. Denies involuntary movements  Allergies:  No Known Allergies  Past Medical History:  Diagnosis Date  . Anxiety   . Benzodiazepine dependence (HCC)   . Colonic polyp    mulitple polyps. repeat in 05/2019  . Constipation    IBS  . COPD (chronic obstructive pulmonary disease) (HCC)   . Depression   . GERD (gastroesophageal reflux disease)   . Hyperlipidemia   . Hypertension   . Insomnia   . Migraine headache   . OAB (overactive bladder)   . Osteoarthritis   . Osteoporosis 12/19/2017  . Post-surgical hypothyroidism   . Pulmonary embolism (HCC)    Eliquis to stop 05/03/19  . Scoliosis     Family History  Problem Relation Age of Onset  . Breast cancer Mother   . Arthritis Mother   . Hearing loss Mother   . Hyperlipidemia Mother   . Hypertension Mother   . Anxiety disorder Mother   . Lung cancer Maternal Uncle   .  Anxiety disorder Maternal Uncle   . Lung cancer Maternal Grandmother   . Heart attack Maternal Grandmother   . Anxiety disorder Maternal Grandmother   . Asthma Father   . COPD Father   . Hyperlipidemia Father   . Hypertension Father   . Alcohol abuse Father   . Anxiety disorder Sister   . Hypertension Sister   . Thyroid cancer Sister   . Anxiety disorder Brother   . Hypertension Brother   . Drug abuse Son   . Anxiety disorder Sister   . Hypertension Sister   . Anxiety disorder Sister   . Hypertension Sister   . Hyperlipidemia Son     Social History   Socioeconomic History  . Marital status: Married    Spouse name: Not on file  . Number of children: Not on file  . Years of education: Not on file  . Highest education level: Not on file  Occupational History  . Occupation: disabled due "my nerves"  Tobacco Use  . Smoking status: Current Every Day Smoker    Packs/day: 0.50    Years: 44.00    Pack years: 22.00    Types: Cigarettes  . Smokeless tobacco: Never Used  Substance and Sexual Activity  . Alcohol use: No  . Drug use: No  . Sexual activity: Not on file  Other Topics Concern  .  Not on file  Social History Narrative  . Not on file   Social Determinants of Health   Financial Resource Strain:   . Difficulty of Paying Living Expenses:   Food Insecurity:   . Worried About Charity fundraiser in the Last Year:   . Arboriculturist in the Last Year:   Transportation Needs:   . Film/video editor (Medical):   Marland Kitchen Lack of Transportation (Non-Medical):   Physical Activity:   . Days of Exercise per Week:   . Minutes of Exercise per Session:   Stress:   . Feeling of Stress :   Social Connections:   . Frequency of Communication with Friends and Family:   . Frequency of Social Gatherings with Friends and Family:   . Attends Religious Services:   . Active Member of Clubs or Organizations:   . Attends Archivist Meetings:   Marland Kitchen Marital Status:   Intimate Partner Violence:   . Fear of Current or Ex-Partner:   . Emotionally Abused:   Marland Kitchen Physically Abused:   . Sexually Abused:     Past Medical History, Surgical history, Social history, and Family history were reviewed and updated as appropriate.   Please see review of systems for further details on the patient's review from today.   Objective:   Physical Exam:  Pulse 68   Wt 144 lb (65.3 kg)   BMI 28.12 kg/m   Physical Exam Neurological:     Mental Status: She is alert and oriented to person, place, and time.     Cranial Nerves: No dysarthria.  Psychiatric:        Attention and Perception: Attention and perception normal.        Mood and Affect: Mood normal.        Speech: Speech normal.        Behavior: Behavior is cooperative.        Thought Content: Thought content normal. Thought content is not paranoid or delusional. Thought content does not include homicidal or suicidal ideation. Thought content does not include homicidal or suicidal plan.  Cognition and Memory: Cognition and memory normal.        Judgment: Judgment normal.     Comments: Insight intact     Lab Review:      Component Value Date/Time   NA 135 10/09/2019 0924   NA 140 10/02/2017 1700   K 4.1 10/09/2019 0924   CL 102 10/09/2019 0924   CO2 26 10/09/2019 0924   GLUCOSE 104 (H) 10/09/2019 0924   BUN 17 10/09/2019 0924   BUN 11 10/02/2017 1700   CREATININE 1.03 10/09/2019 0924   CREATININE 0.98 05/18/2017 1426   CALCIUM 10.4 10/09/2019 0924   PROT 6.9 05/04/2018 1129   ALBUMIN 4.5 05/04/2018 1129   AST 15 05/04/2018 1129   ALT 9 05/04/2018 1129   ALKPHOS 64 05/04/2018 1129   BILITOT 0.2 05/04/2018 1129   GFRNONAA 56 (L) 04/13/2019 0915   GFRNONAA 88 05/10/2017 1023   GFRAA >60 04/13/2019 0915   GFRAA 102 05/10/2017 1023       Component Value Date/Time   WBC 6.5 04/13/2019 0915   RBC 3.83 (L) 04/13/2019 0915   HGB 12.4 04/13/2019 0915   HGB 11.6 08/28/2017 1142   HCT 39.6 04/13/2019 0915   HCT 33.2 (L) 08/28/2017 1142   PLT 93 (L) 04/13/2019 0915   PLT 222 08/28/2017 1142   MCV 103.4 (H) 04/13/2019 0915   MCV 88 08/28/2017 1142   MCH 32.4 04/13/2019 0915   MCHC 31.3 04/13/2019 0915   RDW 13.5 04/13/2019 0915   RDW 13.4 08/28/2017 1142   LYMPHSABS 1.7 04/13/2019 0915   LYMPHSABS 1.7 08/28/2017 1142   MONOABS 0.4 04/13/2019 0915   EOSABS 0.5 04/13/2019 0915   EOSABS 0.1 08/28/2017 1142   BASOSABS 0.0 04/13/2019 0915   BASOSABS 0.0 08/28/2017 1142    No results found for: POCLITH, LITHIUM   No results found for: PHENYTOIN, PHENOBARB, VALPROATE, CBMZ   .res Assessment: Plan:   Will continue current plan of care since target signs and symptoms are well controlled without any tolerability issues. Pt to f/u in 6 months or sooner if clinically indicated.  Patient advised to contact office with any questions, adverse effects, or acute worsening in signs and symptoms.  Breck was seen today for follow-up.  Diagnoses and all orders for this visit:  Primary insomnia -     diazepam (VALIUM) 5 MG tablet; Take 1 tablet in the am, 1/2 tablet at lunch and 1 tablet at night -      gabapentin (NEURONTIN) 400 MG capsule; TAKE 1 CAPSULE TWICE A DAY AND TAKE 4 CAPSULES AT BEDTIME -     mirtazapine (REMERON) 45 MG tablet; Take 1 tablet (45 mg total) by mouth at bedtime.  Generalized anxiety disorder -     diazepam (VALIUM) 5 MG tablet; Take 1 tablet in the am, 1/2 tablet at lunch and 1 tablet at night -     gabapentin (NEURONTIN) 400 MG capsule; TAKE 1 CAPSULE TWICE A DAY AND TAKE 4 CAPSULES AT BEDTIME -     mirtazapine (REMERON) 45 MG tablet; Take 1 tablet (45 mg total) by mouth at bedtime.  Episodic mood disorder (HCC) -     mirtazapine (REMERON) 45 MG tablet; Take 1 tablet (45 mg total) by mouth at bedtime. -     risperiDONE (RISPERDAL) 0.5 MG tablet; TAKE 1 TABLET BY MOUTH EVERYDAY AT BEDTIME    Please see After Visit Summary for patient specific instructions.  No future appointments.  No orders of the defined  types were placed in this encounter.     -------------------------------

## 2019-11-21 ENCOUNTER — Telehealth: Payer: Self-pay | Admitting: Family Medicine

## 2019-11-21 DIAGNOSIS — M549 Dorsalgia, unspecified: Secondary | ICD-10-CM

## 2019-11-21 NOTE — Telephone Encounter (Signed)
Is it okay to refill ? I see this medication was just filled on 11/08/19

## 2019-11-21 NOTE — Telephone Encounter (Signed)
Ok to send in 10 tabs with the same sig. If she needs more, I want to see her. Ty.

## 2019-11-21 NOTE — Telephone Encounter (Signed)
Medication: cyclobenzaprine (FLEXERIL) 10 MG tablet [[id number]]    Has the patient contacted their pharmacy? No. (If no, request that the patient contact the pharmacy for the refill.) (If yes, when and what did the pharmacy advise?)  Preferred Pharmacy (with phone number or street name): [phone] - [place], [place] - [address]  [address] [place], [place] [address]  Phone:  [phone] Fax:  [phone]  DEA #:  [id number]  Agent: Please be advised that RX refills may take up to 3 business days. We ask that you follow-up with your pharmacy.

## 2019-11-22 MED ORDER — CYCLOBENZAPRINE HCL 10 MG PO TABS: 5.0000 mg | ORAL_TABLET | Freq: Three times a day (TID) | ORAL | 0 refills | Status: DC | PRN

## 2019-11-22 NOTE — Addendum Note (Signed)
Addended by: Scharlene Gloss B on: 11/22/2019 07:02 AM   Modules accepted: Orders

## 2019-11-22 NOTE — Telephone Encounter (Signed)
Refill with message sent in

## 2019-12-04 ENCOUNTER — Ambulatory Visit: Payer: PPO | Admitting: Family Medicine

## 2019-12-06 ENCOUNTER — Ambulatory Visit (HOSPITAL_BASED_OUTPATIENT_CLINIC_OR_DEPARTMENT_OTHER)
Admission: RE | Admit: 2019-12-06 | Discharge: 2019-12-06 | Disposition: A | Discharge status: Home / Self Care | Payer: PPO | Source: Ambulatory Visit | Attending: Family Medicine | Admitting: Family Medicine

## 2019-12-06 ENCOUNTER — Encounter: Payer: Self-pay | Admitting: Family Medicine

## 2019-12-06 ENCOUNTER — Other Ambulatory Visit: Payer: Self-pay

## 2019-12-06 ENCOUNTER — Telehealth: Payer: Self-pay

## 2019-12-06 ENCOUNTER — Ambulatory Visit (INDEPENDENT_AMBULATORY_CARE_PROVIDER_SITE_OTHER): Payer: PPO | Admitting: Family Medicine

## 2019-12-06 ENCOUNTER — Telehealth: Payer: Self-pay | Admitting: Family Medicine

## 2019-12-06 VITALS — BP 120/78 | HR 88 | Temp 96.7°F | Ht 59.5 in | Wt 148.5 lb

## 2019-12-06 DIAGNOSIS — M79661 Pain in right lower leg: Secondary | ICD-10-CM

## 2019-12-06 DIAGNOSIS — E890 Postprocedural hypothyroidism: Secondary | ICD-10-CM | POA: Diagnosis not present

## 2019-12-06 DIAGNOSIS — Z1231 Encounter for screening mammogram for malignant neoplasm of breast: Secondary | ICD-10-CM

## 2019-12-06 DIAGNOSIS — M8588 Other specified disorders of bone density and structure, other site: Secondary | ICD-10-CM | POA: Diagnosis not present

## 2019-12-06 DIAGNOSIS — Z86711 Personal history of pulmonary embolism: Secondary | ICD-10-CM | POA: Diagnosis not present

## 2019-12-06 LAB — T4, FREE: Free T4: 0.76 ng/dL (ref 0.60–1.60)

## 2019-12-06 LAB — TSH: TSH: 9.07 u[IU]/mL — ABNORMAL HIGH (ref 0.35–4.50)

## 2019-12-06 MED ORDER — LEVOTHYROXINE SODIUM 112 MCG PO TABS: 112.0000 ug | ORAL_TABLET | Freq: Every day | ORAL | 3 refills | Status: DC

## 2019-12-06 MED ORDER — LEVOTHYROXINE SODIUM 88 MCG PO TABS: 88.0000 ug | ORAL_TABLET | Freq: Every day | ORAL | 3 refills | Status: DC

## 2019-12-06 MED ORDER — LISINOPRIL 40 MG PO TABS: 40.0000 mg | ORAL_TABLET | Freq: Every day | ORAL | 1 refills | Status: DC

## 2019-12-06 MED ORDER — ATORVASTATIN CALCIUM 40 MG PO TABS: ORAL_TABLET | ORAL | 1 refills | Status: DC

## 2019-12-06 MED ORDER — PANTOPRAZOLE SODIUM 40 MG PO TBEC
40.0000 mg | DELAYED_RELEASE_TABLET | Freq: Every day | ORAL | 3 refills | Status: DC
Start: 2019-12-06 — End: 2020-03-24

## 2019-12-06 NOTE — Telephone Encounter (Signed)
Protonix sent. Stop omeprazole. Ty.

## 2019-12-06 NOTE — Patient Instructions (Addendum)
Drink 60-65 oz of water daily.   Heat (pad or rice pillow in microwave) over affected area, 10-15 minutes twice daily.   We will be rechecking your thyroid levels in 6 weeks.   Someone will reach out regarding your mammogram, Cologard, and bone density scan.   I do recommend that you get the Pfizer or Moderna vaccine.   Let us know if you need anything.

## 2019-12-06 NOTE — Progress Notes (Signed)
Chief Complaint  Patient presents with  . Follow-up    Subjective: Patient is a 69 y.o. female here for f/u.  Hx of PE, has had R calf pain for greater than several weeks. She has been walking. No change in activity or injury. She has been taking pickle juice with no improvement. No swelling or new sob/cough.   Hypothyroidism Patient presents for follow-up of hypothyroidism.  Reports compliance with medication 88 mcg/d of levothyroxine.  Current symptoms include: weight gain Denies: fatigue and feeling cold and cold intolerance She believes her dose should be increased  Osteopenia Taking Vit D 1000 u/d and Ca 1260 mg/d. Walking daily. Last dexa showed osteopenia w Z score of 2.2 in lumbar on 12/13/17.  Past Medical History:  Diagnosis Date  . Anxiety   . Benzodiazepine dependence (HCC)   . Colonic polyp    mulitple polyps. repeat in 05/2019  . Constipation    IBS  . COPD (chronic obstructive pulmonary disease) (HCC)   . Depression   . GERD (gastroesophageal reflux disease)   . Hyperlipidemia   . Hypertension   . Insomnia   . Migraine headache   . OAB (overactive bladder)   . Osteoarthritis   . Osteoporosis 12/19/2017  . Post-surgical hypothyroidism   . Pulmonary embolism (HCC)    Eliquis to stop 05/03/19  . Scoliosis     Objective: BP 120/78 (BP Location: Left Arm, Patient Position: Sitting, Cuff Size: Normal)   Pulse 88   Temp (!) 96.7 F (35.9 C) (Temporal)   Ht 4' 11.5" (1.511 m)   Wt 148 lb 8 oz (67.4 kg)   SpO2 97%   BMI 29.49 kg/m  General: Awake, appears stated age HEENT: MMM, EOMi Neck: No thyromegaly or nodules, supple, symmetric Heart: RRR, no murmurs Lungs: CTAB, no rales, wheezes or rhonchi. No accessory muscle use MSK: +TTP over R calf, no edema, neg Homan's Psych: Age appropriate judgment and insight, normal affect and mood  Assessment and Plan: Right calf pain - Plan: US Venous Img Lower Unilateral Right  Post-surgical hypothyroidism -  Plan: TSH, T4, free, levothyroxine (SYNTHROID) 112 MCG tablet, DISCONTINUED: levothyroxine (SYNTHROID) 88 MCG tablet  History of pulmonary embolus (PE)  Osteopenia of lumbar spine - Plan: DG Bone Density  Encounter for screening mammogram for malignant neoplasm of breast - Plan: MM DIGITAL SCREENING BILATERAL  1-likely due to dehydration.  Encouraged around 60 ounces of water daily.  We will double check imaging given her history though. 2/3-increase thyroid dose from 88 mcg daily to 112 mcg daily.  Check thyroid levels in 6 weeks. 4-she is due for 2-year follow-up with her bone density scan in 8 days.  Will place order. 5-schedule mammogram. F/u in 6 weeks.  The patient voiced understanding and agreement to the plan.  Jilda Roche [place], DO 12/06/19  11:56 AM

## 2019-12-06 NOTE — Telephone Encounter (Signed)
Cologuard ordered

## 2019-12-06 NOTE — Telephone Encounter (Signed)
Called informed of PCP response and she verbalized understanding.

## 2019-12-06 NOTE — Telephone Encounter (Signed)
Pt came in office stating that was seen today with Dr Carmelia Roller and forgot to mention to provider that her meds for omeprazole is not working since she has been on it for a long time, pt mentioned that has taken omeprazole with tums and still does not make her issue better. Pt wants to know if she can get a different med for her stomach issues. Please advise. Pt tel [phone].

## 2019-12-18 ENCOUNTER — Ambulatory Visit: Payer: PPO | Admitting: Podiatry

## 2019-12-18 ENCOUNTER — Ambulatory Visit (INDEPENDENT_AMBULATORY_CARE_PROVIDER_SITE_OTHER): Payer: PPO

## 2019-12-18 ENCOUNTER — Other Ambulatory Visit: Payer: Self-pay

## 2019-12-18 ENCOUNTER — Encounter: Payer: Self-pay | Admitting: Podiatry

## 2019-12-18 VITALS — Temp 95.7°F

## 2019-12-18 DIAGNOSIS — M79604 Pain in right leg: Secondary | ICD-10-CM | POA: Diagnosis not present

## 2019-12-18 DIAGNOSIS — M779 Enthesopathy, unspecified: Secondary | ICD-10-CM | POA: Diagnosis not present

## 2019-12-18 MED ORDER — TRAMADOL HCL 50 MG PO TABS: 50.0000 mg | ORAL_TABLET | Freq: Two times a day (BID) | ORAL | 0 refills | Status: DC | PRN

## 2019-12-20 ENCOUNTER — Telehealth: Payer: Self-pay | Admitting: *Deleted

## 2019-12-20 DIAGNOSIS — M79604 Pain in right leg: Secondary | ICD-10-CM

## 2019-12-20 DIAGNOSIS — M779 Enthesopathy, unspecified: Secondary | ICD-10-CM

## 2019-12-20 NOTE — Telephone Encounter (Signed)
Called and spoke with [place] at [facility] and the procedure code 11886 does not need a pre-cert and the reference number is 12-20-2019 and to use [place]. [name]

## 2019-12-20 NOTE — Telephone Encounter (Signed)
Orders given to L. Cox, CMA for pre-cert, faxed to [employer]

## 2019-12-20 NOTE — Telephone Encounter (Signed)
-----   Message from Vivi Barrack, DPM sent at 12/18/2019  9:32 AM EDT ----- Can you please order an MRI of the right tib-fib given chronic leg pain; x-rays negative  She would like it done at [facility]

## 2019-12-21 ENCOUNTER — Other Ambulatory Visit: Payer: Self-pay

## 2019-12-21 ENCOUNTER — Ambulatory Visit (HOSPITAL_BASED_OUTPATIENT_CLINIC_OR_DEPARTMENT_OTHER)
Admission: RE | Admit: 2019-12-21 | Discharge: 2019-12-21 | Disposition: A | Discharge status: Home / Self Care | Payer: PPO | Source: Ambulatory Visit | Attending: Podiatry | Admitting: Podiatry

## 2019-12-21 DIAGNOSIS — R600 Localized edema: Secondary | ICD-10-CM | POA: Diagnosis not present

## 2019-12-21 DIAGNOSIS — M79604 Pain in right leg: Secondary | ICD-10-CM | POA: Insufficient documentation

## 2019-12-21 DIAGNOSIS — M779 Enthesopathy, unspecified: Secondary | ICD-10-CM | POA: Insufficient documentation

## 2019-12-22 ENCOUNTER — Encounter: Payer: Self-pay | Admitting: Podiatry

## 2019-12-23 ENCOUNTER — Other Ambulatory Visit: Payer: Self-pay | Admitting: Podiatry

## 2019-12-23 ENCOUNTER — Encounter: Payer: Self-pay | Admitting: Podiatry

## 2019-12-23 ENCOUNTER — Telehealth: Payer: Self-pay | Admitting: Podiatry

## 2019-12-23 DIAGNOSIS — Z1211 Encounter for screening for malignant neoplasm of colon: Secondary | ICD-10-CM | POA: Diagnosis not present

## 2019-12-23 LAB — COLOGUARD: Cologuard: NEGATIVE

## 2019-12-23 MED ORDER — TRAMADOL HCL 50 MG PO TABS: 50.0000 mg | ORAL_TABLET | Freq: Two times a day (BID) | ORAL | 0 refills | Status: AC | PRN

## 2019-12-23 NOTE — Telephone Encounter (Signed)
Pt would like a refill on the traMADol (ULTRAM) 50 MG. Please advise.

## 2019-12-23 NOTE — Telephone Encounter (Signed)
done

## 2019-12-23 NOTE — Telephone Encounter (Signed)
Pt called checking on her medication refill please advise

## 2019-12-24 NOTE — Progress Notes (Signed)
Subjective:   Patient ID: Brenda Dunn, female   DOB: 69 y.o.   MRN: [id number]   HPI 69 year old female presents the office today for concerns of right leg pain.  She has pain in the front of her right leg and she gets some occasional swelling and tightness.  She previously had a venous duplex which was negative.  She states that it wakes her up at night and she is having difficulty walking barefoot she cannot bend her foot much because of discomfort.  She describes an aching sensation.  Denies any recent injury or falls.   Review of Systems  All other systems reviewed and are negative.  Past Medical History:  Diagnosis Date  . Anxiety   . Benzodiazepine dependence ([place])   . Colonic polyp    mulitple polyps. repeat in 05/2019  . Constipation    IBS  . COPD (chronic obstructive pulmonary disease) ([place])   . Depression   . GERD (gastroesophageal reflux disease)   . Hyperlipidemia   . Hypertension   . Insomnia   . Migraine headache   . OAB (overactive bladder)   . Osteoarthritis   . Osteoporosis 12/19/2017  . Post-surgical hypothyroidism   . Pulmonary embolism ([place])    Eliquis to stop 05/03/19  . Scoliosis     Past Surgical History:  Procedure Laterality Date  . BREAST CYST ASPIRATION Left   . HEMORRHOID SURGERY    . THYROIDECTOMY     at [facility]  . TUBAL LIGATION  1979     Current Outpatient Medications:  .  acetaminophen (TYLENOL) 325 MG tablet, Take 650 mg by mouth every 6 (six) hours as needed., Disp: , Rfl:  .  albuterol (VENTOLIN HFA) 108 (90 Base) MCG/ACT inhaler, INHALE 2 PUFFS BY MOUTH EVERY 4 HOURS AS NEEDED FOR WHEEZING OR SHORTNESS OF BREATH, Disp: 8.8 g, Rfl: 1 .  alendronate (FOSAMAX) 70 MG tablet, TAKE 1 TABLET EVERY WEEK WITH FULL GLASS OF WATER ON EMPTY STOMACH, Disp: 12 tablet, Rfl: 3 .  atorvastatin (LIPITOR) 40 MG tablet, TAKE 1 TABLET BY MOUTH EVERYDAY AT BEDTIME, Disp: 90 tablet, Rfl: 1 .  budesonide-formoterol (SYMBICORT) 80-4.5 MCG/ACT inhaler, TAKE 2  PUFFS BY MOUTH TWICE A DAY, Disp: 3 Inhaler, Rfl: 3 .  Calcium Carbonate-Vitamin D (CALTRATE 600+D) 600-400 MG-UNIT tablet, Take 1 tablet by mouth 2 (two) times daily., Disp: 60 tablet, Rfl: 11 .  cyclobenzaprine (FLEXERIL) 10 MG tablet, Take 0.5-1 tablets (5-10 mg total) by mouth 3 (three) times daily as needed for muscle spasms., Disp: 10 tablet, Rfl: 0 .  diazepam (VALIUM) 5 MG tablet, Take 1 tablet in the am, 1/2 tablet at lunch and 1 tablet at night, Disp: 75 tablet, Rfl: 5 .  Docusate Calcium (STOOL SOFTENER PO), Take 3 tablets by mouth daily., Disp: , Rfl:  .  gabapentin (NEURONTIN) 400 MG capsule, TAKE 1 CAPSULE TWICE A DAY AND TAKE 4 CAPSULES AT BEDTIME, Disp: 540 capsule, Rfl: 1 .  ipratropium-albuterol (DUONEB) 0.5-2.5 (3) MG/3ML SOLN, Take 3 mLs by nebulization every 6 (six) hours as needed (wheezing)., Disp: 360 mL, Rfl: 0 .  levocetirizine (XYZAL) 5 MG tablet, TAKE 1 TABLET BY MOUTH EVERY DAY IN THE EVENING, Disp: 90 tablet, Rfl: 1 .  levothyroxine (SYNTHROID) 112 MCG tablet, Take 1 tablet (112 mcg total) by mouth daily., Disp: 30 tablet, Rfl: 3 .  LINZESS 290 MCG CAPS capsule, TAKE 1 CAPSULE EVERY DAY ON EMPTY STOMACH AT LEAST 30 MINUTES PRIOR TO 1ST MEAL OF  THE DAY, Disp: 90 capsule, Rfl: 1 .  lisinopril (ZESTRIL) 40 MG tablet, Take 1 tablet (40 mg total) by mouth daily., Disp: 90 tablet, Rfl: 1 .  mirtazapine (REMERON) 45 MG tablet, Take 1 tablet (45 mg total) by mouth at bedtime., Disp: 90 tablet, Rfl: 1 .  montelukast (SINGULAIR) 10 MG tablet, TAKE 1 TABLET BY MOUTH EVERYDAY AT BEDTIME, Disp: 90 tablet, Rfl: 1 .  MYRBETRIQ 50 MG TB24 tablet, TAKE 1 TABLET BY MOUTH EVERY DAY, Disp: 90 tablet, Rfl: 1 .  pantoprazole (PROTONIX) 40 MG tablet, Take 1 tablet (40 mg total) by mouth daily., Disp: 30 tablet, Rfl: 3 .  Probiotic Product (PROBIOTIC DAILY PO), Take 2 tablets by mouth daily., Disp: , Rfl:  .  risperiDONE (RISPERDAL) 0.5 MG tablet, TAKE 1 TABLET BY MOUTH EVERYDAY AT BEDTIME,  Disp: 90 tablet, Rfl: 1 .  tiotropium (SPIRIVA) 18 MCG inhalation capsule, Place 1 capsule (18 mcg total) into inhaler and inhale daily., Disp: 30 capsule, Rfl: 12 .  traMADol (ULTRAM) 50 MG tablet, Take 1 tablet (50 mg total) by mouth every 12 (twelve) hours as needed for up to 5 days., Disp: 10 tablet, Rfl: 0  No Known Allergies       Objective:  Physical Exam  General: AAO x3, NAD  Dermatological: Skin is warm, dry and supple bilateral. Nails x 10 are well manicured; remaining integument appears unremarkable at this time. There are no open sores, no preulcerative lesions, no rash or signs of infection present.  Vascular: Dorsalis Pedis artery and Posterior Tibial artery pedal pulses are 2/4 bilateral with immedate capillary fill time.  There is no pain with calf compression, swelling, warmth, erythema.   Neruologic: Grossly intact via light touch bilateral. Vibratory intact via tuning fork bilateral. Protective threshold with Semmes Wienstein monofilament intact to all pedal sites bilateral. Patellar and Achilles deep tendon reflexes 2+ bilateral. No Babinski or clonus noted bilateral.   Musculoskeletal: Mild diffuse tenderness to the right anterior leg as well as to the calf and there does appear to be some tightness in the calf compared to the contralateral extremity.  There is no erythema or warmth.  There are no open lesions.  There is no pain with ankle range of motion is no pain to the foot or ankle itself.  This is causing her to limp.  There is no pain or swelling to the thigh or groin she reports.  No pain the left foot.  Surgical site.  Muscular strength 5/5 in all groups tested bilateral.  Gait: Unassisted, Nonantalgic.       Assessment:   Right leg pain/swelling    Plan:  -Treatment options discussed including all alternatives, risks, and complications -Etiology of symptoms were discussed -X-rays were obtained and reviewed with the patient.  X-rays of the foot and ankle  are obtained and reviewed.  No evidence of acute fracture, calcifications or foreign body. -Given her chronic leg pain and will order an MRI of the tib-fib to further evaluate.  Previous venous duplex was negative.  Also encouraged her to follow-up with her primary care physician for other causes of lower extremity swelling.     Vivi Barrack DPM

## 2019-12-25 ENCOUNTER — Other Ambulatory Visit: Payer: Self-pay | Admitting: Podiatry

## 2019-12-25 DIAGNOSIS — R609 Edema, unspecified: Secondary | ICD-10-CM

## 2019-12-25 DIAGNOSIS — M779 Enthesopathy, unspecified: Secondary | ICD-10-CM

## 2019-12-27 ENCOUNTER — Telehealth: Payer: Self-pay | Admitting: Family Medicine

## 2019-12-27 ENCOUNTER — Encounter: Payer: Self-pay | Admitting: Family Medicine

## 2019-12-27 ENCOUNTER — Other Ambulatory Visit: Payer: Self-pay | Admitting: Family Medicine

## 2019-12-27 LAB — EXTERNAL GENERIC LAB PROCEDURE: COLOGUARD: NEGATIVE

## 2019-12-27 LAB — COLOGUARD: COLOGUARD: NEGATIVE

## 2019-12-27 NOTE — Telephone Encounter (Signed)
Received Cologuard Results. They were negative. Updated Health Maintenance. Abstracted result. Called the patient informed of negative result PCP initialed result/sent to scan.

## 2019-12-28 ENCOUNTER — Ambulatory Visit (HOSPITAL_BASED_OUTPATIENT_CLINIC_OR_DEPARTMENT_OTHER): Payer: PPO

## 2019-12-31 ENCOUNTER — Ambulatory Visit: Payer: PPO | Admitting: Podiatry

## 2020-01-03 ENCOUNTER — Telehealth: Payer: Self-pay

## 2020-01-03 MED ORDER — ALBUTEROL SULFATE HFA 108 (90 BASE) MCG/ACT IN AERS: 2.0000 | INHALATION_SPRAY | RESPIRATORY_TRACT | 3 refills | Status: DC | PRN

## 2020-01-03 MED ORDER — BUDESONIDE-FORMOTEROL FUMARATE 80-4.5 MCG/ACT IN AERO: INHALATION_SPRAY | RESPIRATORY_TRACT | 3 refills | Status: DC

## 2020-01-03 NOTE — Telephone Encounter (Signed)
Called and sent both inhalers in. The patient has been informed

## 2020-01-03 NOTE — Telephone Encounter (Signed)
Patient called in to speak with the nurse about her inhaler . Please call the patient back at [phone]

## 2020-01-08 ENCOUNTER — Other Ambulatory Visit: Payer: PPO

## 2020-01-08 ENCOUNTER — Ambulatory Visit: Payer: PPO

## 2020-01-13 ENCOUNTER — Other Ambulatory Visit: Payer: Self-pay | Admitting: Family Medicine

## 2020-01-13 DIAGNOSIS — E890 Postprocedural hypothyroidism: Secondary | ICD-10-CM

## 2020-01-17 ENCOUNTER — Ambulatory Visit (INDEPENDENT_AMBULATORY_CARE_PROVIDER_SITE_OTHER): Payer: PPO | Admitting: Family Medicine

## 2020-01-17 ENCOUNTER — Other Ambulatory Visit: Payer: Self-pay

## 2020-01-17 ENCOUNTER — Encounter: Payer: Self-pay | Admitting: Family Medicine

## 2020-01-17 VITALS — BP 120/80 | HR 94 | Temp 96.8°F | Ht 59.0 in | Wt 148.4 lb

## 2020-01-17 DIAGNOSIS — M549 Dorsalgia, unspecified: Secondary | ICD-10-CM

## 2020-01-17 DIAGNOSIS — M79604 Pain in right leg: Secondary | ICD-10-CM

## 2020-01-17 DIAGNOSIS — E890 Postprocedural hypothyroidism: Secondary | ICD-10-CM

## 2020-01-17 LAB — TSH: TSH: 1.61 u[IU]/mL (ref 0.35–4.50)

## 2020-01-17 LAB — T4, FREE: Free T4: 1.12 ng/dL (ref 0.60–1.60)

## 2020-01-17 MED ORDER — MONTELUKAST SODIUM 10 MG PO TABS: 10.0000 mg | ORAL_TABLET | Freq: Every day | ORAL | 2 refills | Status: DC

## 2020-01-17 MED ORDER — CYCLOBENZAPRINE HCL 10 MG PO TABS: 5.0000 mg | ORAL_TABLET | Freq: Three times a day (TID) | ORAL | 0 refills | Status: DC | PRN

## 2020-01-17 NOTE — Patient Instructions (Signed)
Give [place] 2-3 business days to get the results of your labs back.   Keep the diet clean and stay active.  Stretching and range of motion exercises These exercises warm up your muscles and joints and improve the movement and flexibility of your lower leg. These exercises also help to relieve pain and stiffness.  Exercise A: Gastrocnemius stretch 1. Sit with your left / right leg extended. 2. Loop a belt or towel around the ball of your left / right foot. The ball of your foot is on the walking surface, right under your toes. 3. Hold both ends of the belt or towel. 4. Keep your left / right ankle and foot relaxed and keep your knee straight while you use the belt or towel to pull your foot and ankle toward you. Stop at the first point of resistance. 5. Hold this position for 30 seconds. Repeat 2 times. Complete this exercise 3 times per week.  Exercise B: Ankle alphabet 1. Sit with your left / right leg supported at the lower leg. ? Do not rest your foot on anything. ? Make sure your foot has room to move freely. 2. Think of your left / right foot as a paintbrush, and move your foot to trace each letter of the alphabet in the air. Keep your hip and knee still while you trace. 3. Trace every letter from A to Z. Repeat 2 times. Complete this exercise 3 times per week.  Strengthening exercises These exercises build strength and endurance in your lower leg. Endurance is the ability to use your muscles for a long time, even after they get tired.  Exercise C: Plantar flexors with band 1. Sit with your left / right leg extended. 2. Loop a rubber exercise band or tube around the ball of your left / right foot. The ball of your foot is on the walking surface, right under your toes. 3. While holding both ends of the band or tube, slowly point your toes downward, pushing them away from you. 4. Hold this position for 3 seconds. 5. Slowly return your foot to the starting position and repeat for a total  of 10 repetitions. Repeat 2 times. Complete this exercise 3 times per week.  Exercise D: Plantar flexors, standing 1. Stand with your feet shoulder-width apart. 2. Place your hands on a wall or table to steady yourself as needed, but try not to use it very much for support. 3. Rise up on your toes. 4. If this exercise is too easy, try these options: ? Shift your weight toward your left / right leg until you feel challenged. ? If told by your health care provider, stand on your left / right foot only. 5. Hold this position for 3 seconds. 6. Repeat for a total of 10 repetitions. Repeat 2 times. Complete this exercise 3 times per week.  Exercise E: Plantar flexors, eccentric 1. Stand on the balls of your feet on the edge of a step. The ball of your foot is on the walking surface, right under your toes. 2. Place your hands on a wall or railing for balance as needed, but try not to lean on it for support. 3. Rise up on your toes, using both legs to help. 4. Slowly shift all of your weight to your left / right foot and lift your other foot off the step. 5. Slowly lower your left / right heel so it drops below the level of the step. You will feel a slight  stretch in your left / right calf. 6. Put your other foot back onto the step. Repeat 2 times. Complete this exercise 3 times per week. This information is not intended to replace advice given to you by your health care provider. Make sure you discuss any questions you have with your health care provider.  Trapezius stretches/exercises Do exercises exactly as told by your health care provider and adjust them as directed. It is normal to feel mild stretching, pulling, tightness, or discomfort as you do these exercises, but you should stop right away if you feel sudden pain or your pain gets worse.  Stretching and range of motion exercises These exercises warm up your muscles and joints and improve the movement and flexibility of your shoulder.  These exercises can also help to relieve pain, numbness, and tingling. If you are unable to do any of the following for any reason, do not further attempt to do it.   Exercise A: Flexion, standing    1. Stand and hold a broomstick, a cane, or a similar object. Place your hands a little more than shoulder-width apart on the object. Your left / right hand should be palm-up, and your other hand should be palm-down. 2. Push the stick to raise your left / right arm out to your side and then over your head. Use your other hand to help move the stick. Stop when you feel a stretch in your shoulder, or when you reach the angle that is recommended by your health care provider. ? Avoid shrugging your shoulder while you raise your arm. Keep your shoulder blade tucked down toward your spine. 3. Hold for 30 seconds. 4. Slowly return to the starting position. Repeat 2 times. Complete this exercise 3 times per week.  Exercise B: Abduction, supine    1. Lie on your back and hold a broomstick, a cane, or a similar object. Place your hands a little more than shoulder-width apart on the object. Your left / right hand should be palm-up, and your other hand should be palm-down. 2. Push the stick to raise your left / right arm out to your side and then over your head. Use your other hand to help move the stick. Stop when you feel a stretch in your shoulder, or when you reach the angle that is recommended by your health care provider. ? Avoid shrugging your shoulder while you raise your arm. Keep your shoulder blade tucked down toward your spine. 3. Hold for 30 seconds. 4. Slowly return to the starting position. Repeat 2 times. Complete this exercise 3 times per week.  Exercise C: Flexion, active-assisted    1. Lie on your back. You may bend your knees for comfort. 2. Hold a broomstick, a cane, or a similar object. Place your hands about shoulder-width apart on the object. Your palms should face toward your  feet. 3. Raise the stick and move your arms over your head and behind your head, toward the floor. Use your healthy arm to help your left / right arm move farther. Stop when you feel a gentle stretch in your shoulder, or when you reach the angle where your health care provider tells you to stop. 4. Hold for 30 seconds. 5. Slowly return to the starting position. Repeat 2 times. Complete this exercise 3 times per week.  Exercise D: External rotation and abduction    1. Stand in a door frame with one of your feet slightly in front of the other. This is called  a staggered stance. 2. Choose one of the following positions as told by your health care provider: ? Place your hands and forearms on the door frame above your head. ? Place your hands and forearms on the door frame at the height of your head. ? Place your hands on the door frame at the height of your elbows. 3. Slowly move your weight onto your front foot until you feel a stretch across your chest and in the front of your shoulders. Keep your head and chest upright and keep your abdominal muscles tight. 4. Hold for 30 seconds. 5. To release the stretch, shift your weight to your back foot. Repeat 2 times. Complete this stretch 3 times per week.  Strengthening exercises These exercises build strength and endurance in your shoulder. Endurance is the ability to use your muscles for a long time, even after your muscles get tired. Exercise E: Scapular depression and adduction  1. Sit on a stable chair. Support your arms in front of you with pillows, armrests, or a tabletop. Keep your elbows in line with the sides of your body. 2. Gently move your shoulder blades down toward your middle back. Relax the muscles on the tops of your shoulders and in the back of your neck. 3. Hold for 3 seconds. 4. Slowly release the tension and relax your muscles completely before doing this exercise again. Repeat for a total of 10 repetitions. 5. After you have  practiced this exercise, try doing the exercise without the arm support. Then, try the exercise while standing instead of sitting. Repeat 2 times. Complete this exercise 3 times per week.  Exercise F: Shoulder abduction, isometric    1. Stand or sit about 4-6 inches (10-15 cm) from a wall with your left / right side facing the wall. 2. Bend your left / right elbow and gently press your elbow against the wall. 3. Increase the pressure slowly until you are pressing as hard as you can without shrugging your shoulder. 4. Hold for 3 seconds. 5. Slowly release the tension and relax your muscles completely. Repeat for a total of 10 repetitions. Repeat 2 times. Complete this exercise 3 times per week.  Exercise G: Shoulder flexion, isometric    1. Stand or sit about 4-6 inches (10-15 cm) away from a wall with your left / right side facing the wall. 2. Keep your left / right elbow straight and gently press the top of your fist against the wall. Increase the pressure slowly until you are pressing as hard as you can without shrugging your shoulder. 3. Hold for 10-15 seconds. 4. Slowly release the tension and relax your muscles completely. Repeat for a total of 10 repetitions. Repeat 2 times. Complete this exercise 3 times per week.  Exercise H: Internal rotation    1. Sit in a stable chair without armrests, or stand. Secure an exercise band at your left / right side, at elbow height. 2. Place a soft object, such as a folded towel or a small pillow, under your left / right upper arm so your elbow is a few inches (about 8 cm) away from your side. 3. Hold the end of the exercise band so the band stretches. 4. Keeping your elbow pressed against the soft object under your arm, move your forearm across your body toward your abdomen. Keep your body steady so the movement is only coming from your shoulder. 5. Hold for 3 seconds. 6. Slowly return to the starting position. Repeat for a  total of 10  repetitions. Repeat 2 times. Complete this exercise 3 times per week.  Exercise I: External rotation    1. Sit in a stable chair without armrests, or stand. 2. Secure an exercise band at your left / right side, at elbow height. 3. Place a soft object, such as a folded towel or a small pillow, under your left / right upper arm so your elbow is a few inches (about 8 cm) away from your side. 4. Hold the end of the exercise band so the band stretches. 5. Keeping your elbow pressed against the soft object under your arm, move your forearm out, away from your abdomen. Keep your body steady so the movement is only coming from your shoulder. 6. Hold for 3 seconds. 7. Slowly return to the starting position. Repeat for a total of 10 repetitions. Repeat 2 times. Complete this exercise 3 times per week. Exercise J: Shoulder extension  1. Sit in a stable chair without armrests, or stand. Secure an exercise band to a stable object in front of you so the band is at shoulder height. 2. Hold one end of the exercise band in each hand. Your palms should face each other. 3. Straighten your elbows and lift your hands up to shoulder height. 4. Step back, away from the secured end of the exercise band, until the band stretches. 5. Squeeze your shoulder blades together and pull your hands down to the sides of your thighs. Stop when your hands are straight down by your sides. Do not let your hands go behind your body. 6. Hold for 3 seconds. 7. Slowly return to the starting position. Repeat for a total of 10 repetitions. Repeat 2 times. Complete this exercise 3 times per week.  Exercise K: Shoulder extension, prone    1. Lie on your abdomen on a firm surface so your left / right arm hangs over the edge. 2. Hold a 5 lb weight in your hand so your palm faces in toward your body. Your arm should be straight. 3. Squeeze your shoulder blade down toward the middle of your back. 4. Slowly raise your arm behind you,  up to the height of the surface that you are lying on. Keep your arm straight. 5. Hold for 3 seconds. 6. Slowly return to the starting position and relax your muscles. Repeat for a total of 10 repetitions. Repeat 2 times. Complete this exercise 3 times per week.   Exercise L: Horizontal abduction, prone  1. Lie on your abdomen on a firm surface so your left / right arm hangs over the edge. 2. Hold a 5 lb weight in your hand so your palm faces toward your feet. Your arm should be straight. 3. Squeeze your shoulder blade down toward the middle of your back. 4. Bend your elbow so your hand moves up, until your elbow is bent to an "L" shape (90 degrees). With your elbow bent, slowly move your forearm forward and up. Raise your hand up to the height of the surface that you are lying on. ? Your upper arm should not move, and your elbow should stay bent. ? At the top of the movement, your palm should face the floor. 5. Hold for 3 seconds. 6. Slowly return to the starting position and relax your muscles. Repeat for a total of 10 repetitions. Repeat 2 times. Complete this exercise 3 times per week.  Exercise M: Horizontal abduction, standing  1. Sit on a stable chair, or stand. 2.  Secure an exercise band to a stable object in front of you so the band is at shoulder height. 3. Hold one end of the exercise band in each hand. 4. Straighten your elbows and lift your hands straight in front of you, up to shoulder height. Your palms should face down, toward the floor. 5. Step back, away from the secured end of the exercise band, until the band stretches. 6. Move your arms out to your sides, and keep your arms straight. 7. Hold for 3 seconds. 8. Slowly return to the starting position. Repeat for a total of 10 repetitions. Repeat 2 times. Complete this exercise 3 times per week.  Exercise N: Scapular retraction and elevation  1. Sit on a stable chair, or stand. 2. Secure an exercise band to a stable  object in front of you so the band is at shoulder height. 3. Hold one end of the exercise band in each hand. Your palms should face each other. 4. Sit in a stable chair without armrests, or stand. 5. Step back, away from the secured end of the exercise band, until the band stretches. 6. Squeeze your shoulder blades together and lift your hands over your head. Keep your elbows straight. 7. Hold for 3 seconds. 8. Slowly return to the starting position. Repeat for a total of 10 repetitions. Repeat 2 times. Complete this exercise 3 times per week.  This information is not intended to replace advice given to you by your health care provider. Make sure you discuss any questions you have with your health care provider. Document Released: 07/18/2005 Document Revised: 03/24/2016 Document Reviewed: 06/04/2015 Elsevier Interactive Patient Education  2017 [employer].

## 2020-01-17 NOTE — Progress Notes (Signed)
Chief Complaint  Patient presents with  . Follow-up    Subjective: Patient is a 69 y.o. female here for f/u.  Hypothyroidism Patient presents for follow-up of hypothyroidism.  Reports compliance with medication- Synthroid 112 mcg/d. Current symptoms include: denies fatigue, weight changes, heat/cold intolerance, bowel/skin changes or CVS symptoms She believes her dose should be unchanged  Patient has continued right lower extremity pain.  She had an unremarkable lower extremity venous duplex in addition to normal MRI per her podiatrist.  She has pain whenever she walks.  It gets better when she stops but she also gets it at rest later in the afternoon.  She does smoke.  She is not losing hair on her legs.  No bruising, redness, or swelling.  Past Medical History:  Diagnosis Date  . Anxiety   . Benzodiazepine dependence ([facility])   . Colonic polyp    mulitple polyps. repeat in 05/2019  . Constipation    IBS  . COPD (chronic obstructive pulmonary disease) ([facility])   . Depression   . GERD (gastroesophageal reflux disease)   . Hyperlipidemia   . Hypertension   . Insomnia   . Migraine headache   . OAB (overactive bladder)   . Osteoarthritis   . Osteoporosis 12/19/2017  . Post-surgical hypothyroidism   . Pulmonary embolism ([facility])    Eliquis to stop 05/03/19  . Scoliosis     Objective: BP 120/80 (BP Location: Left Arm, Patient Position: Sitting, Cuff Size: Normal)   Pulse 94   Temp (!) 96.8 F (36 C) (Temporal)   Ht 4\' 11"  (1.499 m)   Wt 148 lb 6 oz (67.3 kg)   SpO2 96%   BMI 29.97 kg/m  General: Awake, appears stated age Lungs: No accessory muscle use MSK: Tender to palpation over bilateral calves, worse on the right; there is tenderness over the peroneus muscles on the right as well; no bony tenderness Neuro: Gait antalgic Psych: Age appropriate judgment and insight, normal affect and mood  Assessment and Plan: Postoperative hypothyroidism - Plan: T4, free, TSH  Pain of  right lower extremity  Upper back pain on right side  Upper back pain - Plan: cyclobenzaprine (FLEXERIL) 10 MG tablet  1-she feels better at this dosage.  Check labs today. 2-stretches and exercises for the lower extremity provided, if no improvement over the next month will refer to physical therapy and consider arterial duplex 3-stretches and exercises, refill Flexeril; physical therapy if no improvement, could consider trigger point injection. I will see her in 1 month. The patient voiced understanding and agreement to the plan.  [place], DO 01/17/20  1:38 PM

## 2020-01-29 ENCOUNTER — Other Ambulatory Visit: Payer: PPO

## 2020-01-29 ENCOUNTER — Ambulatory Visit: Payer: PPO

## 2020-02-12 ENCOUNTER — Ambulatory Visit: Payer: PPO | Admitting: *Deleted

## 2020-02-12 NOTE — Progress Notes (Addendum)
I connected with [name] today by telephone and verified that I am speaking with the correct person using two identifiers. Location patient: home Location provider: work Persons participating in the virtual visit: patient, Engineer, civil (consulting).    I discussed the limitations, risks, security and privacy concerns of performing an evaluation and management service by telephone and the availability of in person appointments. I also discussed with the patient that there may be a patient responsible charge related to this service. The patient expressed understanding and verbally consented to this telephonic visit.    Interactive audio and video telecommunications were attempted between this provider and patient, however failed, due to patient having technical difficulties OR patient did not have access to video capability.  We continued and completed visit with audio only.  Some vital signs may be absent or patient reported.    Subjective:   Brenda Dunn is a 69 y.o. female who presents for an Medicare Annual Wellness Visit.  Review of Systems    Cardiac Risk Factors include: advanced age (>48men, >29 women);dyslipidemia;smoking/ tobacco exposure     Objective:     Advanced Directives 02/13/2020 04/13/2019 02/28/2019 08/03/2017 08/03/2017 07/15/2017 05/28/2017  Does Patient Have a Medical Advance Directive? No No No No No No No  Would patient like information on creating a medical advance directive? No - Patient declined - - No - Patient declined - No - Patient declined No - Patient declined    Current Medications (verified) Outpatient Encounter Medications as of 02/13/2020  Medication Sig  . acetaminophen (TYLENOL) 325 MG tablet Take 650 mg by mouth every 6 (six) hours as needed.  Marland Kitchen albuterol (VENTOLIN HFA) 108 (90 Base) MCG/ACT inhaler Inhale 2 puffs into the lungs every 4 (four) hours as needed for wheezing or shortness of breath.  Marland Kitchen alendronate (FOSAMAX) 70 MG tablet TAKE 1 TABLET EVERY WEEK WITH FULL GLASS  OF WATER ON EMPTY STOMACH  . atorvastatin (LIPITOR) 40 MG tablet TAKE 1 TABLET BY MOUTH EVERYDAY AT BEDTIME  . budesonide-formoterol (SYMBICORT) 80-4.5 MCG/ACT inhaler TAKE 2 PUFFS BY MOUTH TWICE A DAY  . Calcium Carbonate-Vitamin D (CALTRATE 600+D) 600-400 MG-UNIT tablet Take 1 tablet by mouth 2 (two) times daily.  . diazepam (VALIUM) 5 MG tablet Take 1 tablet in the am, 1/2 tablet at lunch and 1 tablet at night  . Docusate Calcium (STOOL SOFTENER PO) Take 3 tablets by mouth daily.  Marland Kitchen gabapentin (NEURONTIN) 400 MG capsule TAKE 1 CAPSULE TWICE A DAY AND TAKE 4 CAPSULES AT BEDTIME  . levocetirizine (XYZAL) 5 MG tablet TAKE 1 TABLET BY MOUTH EVERY DAY IN THE EVENING  . levothyroxine (SYNTHROID) 112 MCG tablet TAKE 1 TABLET BY MOUTH EVERY DAY  . LINZESS 290 MCG CAPS capsule TAKE 1 CAPSULE EVERY DAY ON EMPTY STOMACH AT LEAST 30 MINUTES PRIOR TO 1ST MEAL OF THE DAY  . lisinopril (ZESTRIL) 40 MG tablet Take 1 tablet (40 mg total) by mouth daily.  . mirtazapine (REMERON) 45 MG tablet Take 1 tablet (45 mg total) by mouth at bedtime.  . montelukast (SINGULAIR) 10 MG tablet Take 1 tablet (10 mg total) by mouth at bedtime.  Marland Kitchen MYRBETRIQ 50 MG TB24 tablet TAKE 1 TABLET BY MOUTH EVERY DAY  . pantoprazole (PROTONIX) 40 MG tablet Take 1 tablet (40 mg total) by mouth daily.  . Probiotic Product (PROBIOTIC DAILY PO) Take 2 tablets by mouth daily.  . risperiDONE (RISPERDAL) 0.5 MG tablet TAKE 1 TABLET BY MOUTH EVERYDAY AT BEDTIME  . tiotropium (SPIRIVA) 18  MCG inhalation capsule Place 1 capsule (18 mcg total) into inhaler and inhale daily.  . cyclobenzaprine (FLEXERIL) 10 MG tablet Take 0.5-1 tablets (5-10 mg total) by mouth 3 (three) times daily as needed for muscle spasms. (Patient not taking: Reported on 02/13/2020)  . ipratropium-albuterol (DUONEB) 0.5-2.5 (3) MG/3ML SOLN Take 3 mLs by nebulization every 6 (six) hours as needed (wheezing). (Patient not taking: Reported on 02/13/2020)   No facility-administered  encounter medications on file as of 02/13/2020.    Allergies (verified) Patient has no known allergies.   History: Past Medical History:  Diagnosis Date  . Anxiety   . Benzodiazepine dependence (HCC)   . Colonic polyp    mulitple polyps. repeat in 05/2019  . Constipation    IBS  . COPD (chronic obstructive pulmonary disease) (HCC)   . Depression   . GERD (gastroesophageal reflux disease)   . Hyperlipidemia   . Hypertension   . Insomnia   . Migraine headache   . OAB (overactive bladder)   . Osteoarthritis   . Osteoporosis 12/19/2017  . Post-surgical hypothyroidism   . Pulmonary embolism (HCC)    Eliquis to stop 05/03/19  . Scoliosis    Past Surgical History:  Procedure Laterality Date  . BREAST CYST ASPIRATION Left   . HEMORRHOID SURGERY    . THYROIDECTOMY     at [facility]  . TUBAL LIGATION  1979   Family History  Problem Relation Age of Onset  . Breast cancer Mother   . Arthritis Mother   . Hearing loss Mother   . Hyperlipidemia Mother   . Hypertension Mother   . Anxiety disorder Mother   . Lung cancer Maternal Uncle   . Anxiety disorder Maternal Uncle   . Lung cancer Maternal Grandmother   . Heart attack Maternal Grandmother   . Anxiety disorder Maternal Grandmother   . Asthma Father   . COPD Father   . Hyperlipidemia Father   . Hypertension Father   . Alcohol abuse Father   . Anxiety disorder Sister   . Hypertension Sister   . Thyroid cancer Sister   . Anxiety disorder Brother   . Hypertension Brother   . Drug abuse Son   . Anxiety disorder Sister   . Hypertension Sister   . Anxiety disorder Sister   . Hypertension Sister   . Hyperlipidemia Son    Social History   Socioeconomic History  . Marital status: Married    Spouse name: Not on file  . Number of children: Not on file  . Years of education: Not on file  . Highest education level: Not on file  Occupational History  . Occupation: disabled due "my nerves"  Tobacco Use  . Smoking status:  Current Every Day Smoker    Packs/day: 1.00    Years: 44.00    Pack years: 44.00    Types: Cigarettes  . Smokeless tobacco: Never Used  Vaping Use  . Vaping Use: Former  Substance and Sexual Activity  . Alcohol use: No  . Drug use: No  . Sexual activity: Not on file  Other Topics Concern  . Not on file  Social History Narrative  . Not on file   Social Determinants of Health   Financial Resource Strain: Low Risk   . Difficulty of Paying Living Expenses: Not hard at all  Food Insecurity: No Food Insecurity  . Worried About Programme researcher, broadcasting/film/video in the Last Year: Never true  . Ran Out of Food in the Last  Year: Never true  Transportation Needs: No Transportation Needs  . Lack of Transportation (Medical): No  . Lack of Transportation (Non-Medical): No  Physical Activity:   . Days of Exercise per Week:   . Minutes of Exercise per Session:   Stress:   . Feeling of Stress :   Social Connections:   . Frequency of Communication with Friends and Family:   . Frequency of Social Gatherings with Friends and Family:   . Attends Religious Services:   . Active Member of Clubs or Organizations:   . Attends BankerClub or Organization Meetings:   Marland Kitchen. Marital Status:     Tobacco Counseling Ready to quit: No Counseling given: No   Clinical Intake: Pain : No/denies pain    Activities of Daily Living In your present state of health, do you have any difficulty performing the following activities: 02/13/2020 01/17/2020  Hearing? N N  Vision? N N  Difficulty concentrating or making decisions? N N  Walking or climbing stairs? N N  Dressing or bathing? N N  Doing errands, shopping? N N  Preparing Food and eating ? N -  Using the Toilet? N -  In the past six months, have you accidently leaked urine? N -  Do you have problems with loss of bowel control? N -  Managing your Medications? N -  Managing your Finances? N -  Housekeeping or managing your Housekeeping? N -  Some recent data might be  hidden    Patient Care Team: Sharlene DoryWendling, [name] Paul, DO as PCP - General (Family Medicine)  Indicate any recent Medical Services you may have received from other than [facility] providers in the past year (date may be approximate).     Assessment:   This is a routine wellness examination for Deneice.  Hearing/Vision screen Unable to assess. This visit is enabled though telemedicine due to Covid 19.   Dietary issues and exercise activities discussed: Current Exercise Habits: Home exercise routine, Type of exercise: walking, Time (Minutes): 10, Frequency (Times/Week): 7, Weekly Exercise (Minutes/Week): 70, Intensity: Mild, Exercise limited by: respiratory conditions(s) Diet (meal preparation, eat out, water intake, caffeinated beverages, dairy products, fruits and vegetables): well balanced     Goals    . DIET - INCREASE WATER INTAKE      Depression Screen PHQ 2/9 Scores 02/13/2020 01/17/2020 05/02/2017  PHQ - 2 Score 1 0 1  PHQ- 9 Score - 0 -    Fall Risk Fall Risk  02/13/2020 05/02/2017  Falls in the past year? 0 No  Number falls in past yr: 0 -  Injury with Fall? 0 -  Follow up Education provided;Falls prevention discussed -   Lives w/ husband in 1 story home.  Any stairs in or around the home? Yes  If so, are there any without handrails? No  Home free of loose throw rugs in walkways, pet beds, electrical cords, etc? Yes  Adequate lighting in your home to reduce risk of falls? Yes    Cognitive Function: Ad8 score reviewed for issues:  Issues making decisions:no  Less interest in hobbies / activities:no  Repeats questions, stories (family complaining):no  Trouble using ordinary gadgets (microwave, computer, phone):no  Forgets the month or year: no  Mismanaging finances: no  Remembering appts:no  Daily problems with thinking and/or memory:no Ad8 score is=0        Immunizations Immunization History  Administered Date(s) Administered  . Fluad Quad(high Dose 65+)  04/01/2019  . Influenza, High Dose Seasonal PF 05/07/2017  .  Influenza-Unspecified 04/25/2018  . Pneumococcal Conjugate-13 09/26/2016, 10/03/2017    TDAP status: Due, Education has been provided regarding the importance of this vaccine. Advised may receive this vaccine at local pharmacy or Health Dept. Aware to provide a copy of the vaccination record if obtained from local pharmacy or Health Dept. Verbalized acceptance and understanding. Flu Vaccine status: Up to date Covid-19 vaccine status: Declined, Education has been provided regarding the importance of this vaccine but patient still declined. Advised may receive this vaccine at local pharmacy or Health Dept.or vaccine clinic. Aware to provide a copy of the vaccination record if obtained from local pharmacy or Health Dept. Verbalized acceptance and understanding.   Screening Tests Health Maintenance  Topic Date Due  . Hepatitis C Screening  Never done  . COVID-19 Vaccine (1) Never done  . TETANUS/TDAP  Never done  . PNA vac Low Risk Adult (2 of 2 - PPSV23) 10/04/2018  . MAMMOGRAM  11/09/2019  . INFLUENZA VACCINE  03/01/2020  . Fecal DNA (Cologuard)  12/23/2022  . DEXA SCAN  Completed    Health Maintenance  Health Maintenance Due  Topic Date Due  . Hepatitis C Screening  Never done  . COVID-19 Vaccine (1) Never done  . TETANUS/TDAP  Never done  . PNA vac Low Risk Adult (2 of 2 - PPSV23) 10/04/2018  . MAMMOGRAM  11/09/2019    Colorectal cancer screening: Completed 12/23/19 (Cologuard). Repeat every 3 years Mammogram- pt states she had to reschedule. Bone Density status: Completed 12/14/17. Results reflect: Bone density results: OSTEOPENIA. Repeat every 2 years.  Lung Cancer Screening: (Low Dose CT Chest recommended if Age 105-80 years, 30 pack-year currently smoking OR have quit w/in 15years.) does qualify.   Lung Cancer Screening Referral: pt declines  Additional Screening:  Hepatitis C Screening: does qualify;  Completed--pt reports she did in 2017 in [facility]  Vision Screening: Recommended annual ophthalmology exams for early detection of glaucoma and other disorders of the eye. Is the patient up to date with their annual eye exam?  yes per pt  Dental Screening: Recommended annual dental exams for proper oral hygiene  Community Resource Referral / Chronic Care Management: CRR required this visit?  No   CCM required this visit?  No      Plan:    Please schedule your next medicare wellness visit with me in 1 yr.  Continue to eat heart healthy diet (full of fruits, vegetables, whole grains, lean protein, water--limit salt, fat, and sugar intake) and increase physical activity as tolerated.  Continue doing brain stimulating activities (puzzles, reading, adult coloring books, staying active) to keep memory sharp.    I have personally reviewed and noted the following in the patient's chart:   . Medical and social history . Use of alcohol, tobacco or illicit drugs  . Current medications and supplements . Functional ability and status . Nutritional status . Physical activity . Advanced directives . List of other physicians . Hospitalizations, surgeries, and ER visits in previous 12 months . Vitals . Screenings to include cognitive, depression, and falls . Referrals and appointments  In addition, I have reviewed and discussed with patient certain preventive protocols, quality metrics, and best practice recommendations. A written personalized care plan for preventive services as well as general preventive health recommendations were provided to patient.   Due to this being a telephonic visit, the after visit summary with patients personalized plan was offered to patient via mail or my-chart.  Patient would like to access on  my-chart   Avon Gully, RN   02/13/2020    Medical screening examination/treatment was performed by qualified clinical staff member and as supervising  physician I was immediately available for consultation/collaboration. I have reviewed documentation and agree with assessment and plan.  Danise Edge, MD

## 2020-02-13 ENCOUNTER — Other Ambulatory Visit: Payer: Self-pay

## 2020-02-13 ENCOUNTER — Encounter: Payer: Self-pay | Admitting: *Deleted

## 2020-02-13 ENCOUNTER — Ambulatory Visit (INDEPENDENT_AMBULATORY_CARE_PROVIDER_SITE_OTHER): Payer: PPO | Admitting: *Deleted

## 2020-02-13 DIAGNOSIS — Z0000 Encounter for general adult medical examination without abnormal findings: Secondary | ICD-10-CM

## 2020-02-13 NOTE — Patient Instructions (Signed)
Please schedule your next medicare wellness visit with me in 1 yr.  Continue to eat heart healthy diet (full of fruits, vegetables, whole grains, lean protein, water--limit salt, fat, and sugar intake) and increase physical activity as tolerated.  Continue doing brain stimulating activities (puzzles, reading, adult coloring books, staying active) to keep memory sharp.    Brenda Dunn , Thank you for taking time to come for your Medicare Wellness Visit. I appreciate your ongoing commitment to your health goals. Please review the following plan we discussed and let me know if I can assist you in the future.   These are the goals we discussed: Goals    . DIET - INCREASE WATER INTAKE       This is a list of the screening recommended for you and due dates:  Health Maintenance  Topic Date Due  .  Hepatitis C: One time screening is recommended by Center for Disease Control  (CDC) for  adults born from [birth year] through 1964.   Never done  . Tetanus Vaccine  Never done  . Pneumonia vaccines (2 of 2 - PPSV23) 10/04/2018  . Mammogram  11/09/2019  . COVID-19 Vaccine (1) 02/29/2020*  . Flu Shot  03/01/2020  . Cologuard (Stool DNA test)  12/23/2022  . DEXA scan (bone density measurement)  Completed  *Topic was postponed. The date shown is not the original due date.    Preventive Care 25 Years and Older, Female Preventive care refers to lifestyle choices and visits with your health care provider that can promote health and wellness. This includes:  A yearly physical exam. This is also called an annual well check.  Regular dental and eye exams.  Immunizations.  Screening for certain conditions.  Healthy lifestyle choices, such as diet and exercise. What can I expect for my preventive care visit? Physical exam Your health care provider will check:  Height and weight. These may be used to calculate body mass index (BMI), which is a measurement that tells if you are at a healthy weight.  Heart  rate and blood pressure.  Your skin for abnormal spots. Counseling Your health care provider may ask you questions about:  Alcohol, tobacco, and drug use.  Emotional well-being.  Home and relationship well-being.  Sexual activity.  Eating habits.  History of falls.  Memory and ability to understand (cognition).  Work and work Statistician.  Pregnancy and menstrual history. What immunizations do I need?  Influenza (flu) vaccine  This is recommended every year. Tetanus, diphtheria, and pertussis (Tdap) vaccine  You may need a Td booster every 10 years. Varicella (chickenpox) vaccine  You may need this vaccine if you have not already been vaccinated. Zoster (shingles) vaccine  You may need this after age 40. Pneumococcal conjugate (PCV13) vaccine  One dose is recommended after age 78. Pneumococcal polysaccharide (PPSV23) vaccine  One dose is recommended after age 37. Measles, mumps, and rubella (MMR) vaccine  You may need at least one dose of MMR if you were born in 1957 or later. You may also need a second dose. Meningococcal conjugate (MenACWY) vaccine  You may need this if you have certain conditions. Hepatitis A vaccine  You may need this if you have certain conditions or if you travel or work in places where you may be exposed to hepatitis A. Hepatitis B vaccine  You may need this if you have certain conditions or if you travel or work in places where you may be exposed to hepatitis B. Haemophilus influenzae  type b (Hib) vaccine  You may need this if you have certain conditions. You may receive vaccines as individual doses or as more than one vaccine together in one shot (combination vaccines). Talk with your health care provider about the risks and benefits of combination vaccines. What tests do I need? Blood tests  Lipid and cholesterol levels. These may be checked every 5 years, or more frequently depending on your overall health.  Hepatitis C  test.  Hepatitis B test. Screening  Lung cancer screening. You may have this screening every year starting at age 13 if you have a 30-pack-year history of smoking and currently smoke or have quit within the past 15 years.  Colorectal cancer screening. All adults should have this screening starting at age 11 and continuing until age 4. Your health care provider may recommend screening at age 25 if you are at increased risk. You will have tests every 1-10 years, depending on your results and the type of screening test.  Diabetes screening. This is done by checking your blood sugar (glucose) after you have not eaten for a while (fasting). You may have this done every 1-3 years.  Mammogram. This may be done every 1-2 years. Talk with your health care provider about how often you should have regular mammograms.  BRCA-related cancer screening. This may be done if you have a family history of breast, ovarian, tubal, or peritoneal cancers. Other tests  Sexually transmitted disease (STD) testing.  Bone density scan. This is done to screen for osteoporosis. You may have this done starting at age 66. Follow these instructions at home: Eating and drinking  Eat a diet that includes fresh fruits and vegetables, whole grains, lean protein, and low-fat dairy products. Limit your intake of foods with high amounts of sugar, saturated fats, and salt.  Take vitamin and mineral supplements as recommended by your health care provider.  Do not drink alcohol if your health care provider tells you not to drink.  If you drink alcohol: ? Limit how much you have to 0-1 drink a day. ? Be aware of how much alcohol is in your drink. In the [place] one drink equals one 12 oz bottle of beer (355 mL), one 5 oz glass of wine (148 mL), or one 1 oz glass of hard liquor (44 mL). Lifestyle  Take daily care of your teeth and gums.  Stay active. Exercise for at least 30 minutes on 5 or more days each week.  Do not use  any products that contain nicotine or tobacco, such as cigarettes, e-cigarettes, and chewing tobacco. If you need help quitting, ask your health care provider.  If you are sexually active, practice safe sex. Use a condom or other form of protection in order to prevent STIs (sexually transmitted infections).  Talk with your health care provider about taking a low-dose aspirin or statin. What's next?  Go to your health care provider once a year for a well check visit.  Ask your health care provider how often you should have your eyes and teeth checked.  Stay up to date on all vaccines. This information is not intended to replace advice given to you by your health care provider. Make sure you discuss any questions you have with your health care provider. Document Revised: 07/12/2018 Document Reviewed: 07/12/2018 Elsevier Patient Education  2020 [employer].

## 2020-02-17 ENCOUNTER — Ambulatory Visit (INDEPENDENT_AMBULATORY_CARE_PROVIDER_SITE_OTHER): Payer: PPO | Admitting: Family Medicine

## 2020-02-17 ENCOUNTER — Other Ambulatory Visit: Payer: Self-pay

## 2020-02-17 ENCOUNTER — Encounter: Payer: Self-pay | Admitting: Family Medicine

## 2020-02-17 VITALS — BP 104/68 | HR 82 | Temp 97.9°F | Ht 59.0 in | Wt 151.1 lb

## 2020-02-17 DIAGNOSIS — G8929 Other chronic pain: Secondary | ICD-10-CM | POA: Diagnosis not present

## 2020-02-17 DIAGNOSIS — K219 Gastro-esophageal reflux disease without esophagitis: Secondary | ICD-10-CM | POA: Diagnosis not present

## 2020-02-17 DIAGNOSIS — E669 Obesity, unspecified: Secondary | ICD-10-CM | POA: Diagnosis not present

## 2020-02-17 DIAGNOSIS — M79604 Pain in right leg: Secondary | ICD-10-CM | POA: Diagnosis not present

## 2020-02-17 DIAGNOSIS — M47816 Spondylosis without myelopathy or radiculopathy, lumbar region: Secondary | ICD-10-CM | POA: Insufficient documentation

## 2020-02-17 MED ORDER — ESOMEPRAZOLE MAGNESIUM 40 MG PO CPDR: 40.0000 mg | DELAYED_RELEASE_CAPSULE | Freq: Every day | ORAL | 3 refills | Status: DC

## 2020-02-17 MED ORDER — OZEMPIC (0.25 OR 0.5 MG/DOSE) 2 MG/1.5ML ~~LOC~~ SOPN: PEN_INJECTOR | SUBCUTANEOUS | 1 refills | Status: DC

## 2020-02-17 NOTE — Progress Notes (Signed)
Chief Complaint  Patient presents with  . Follow-up    Subjective: Patient is a 69 y.o. female here for f/u. Here w spouse Brett Canales.  Pt following up for RLE pain. Saw podiatry who ordered MRI that was unremarkable. DVT eval neg. Still having exertional pain. She is a smoker w hx of HTN. No bruising, swelling, redness, neurologic s/s's. She was given calf stretches/exercises that were only slightly helpful. Pain is less in calf area now and more on outer ankle region.   Pt changed from Nexium to Protonix, feels it is less effective. Wondering what other options she has.   Pt frustrated with weight. Diet/exercise failed. She is interested in medication to help with the process.   Past Medical History:  Diagnosis Date  . Anxiety   . Benzodiazepine dependence (HCC)   . Colonic polyp    mulitple polyps. repeat in 05/2019  . Constipation    IBS  . COPD (chronic obstructive pulmonary disease) (HCC)   . Depression   . GERD (gastroesophageal reflux disease)   . Hyperlipidemia   . Hypertension   . Insomnia   . Migraine headache   . OAB (overactive bladder)   . Osteoarthritis   . Osteoporosis 12/19/2017  . Post-surgical hypothyroidism   . Pulmonary embolism (HCC)    Eliquis to stop 05/03/19  . Scoliosis     Objective: BP 104/68 (BP Location: Left Arm, Patient Position: Sitting, Cuff Size: Normal)   Pulse 82   Temp 97.9 F (36.6 C) (Oral)   Ht 4\' 11"  (1.499 m)   Wt 151 lb 2 oz (68.5 kg)   SpO2 98%   BMI 30.52 kg/m  General: Awake, appears stated age MSK: no calf ttp, no pain currently, but upon palpation of peroneus brevis, tendon, she states that is where her pain is Heart: DP pulse 2+ on R Lungs: No accessory muscle use Psych: Age appropriate judgment and insight, normal affect and mood  Assessment and Plan: Chronic pain of right lower extremity - Plan: ARTERIAL LOWER EXTREMITY DUPLEX RIGHT(NON-ABI), Ambulatory referral to Physical Therapy  Gastroesophageal reflux  disease, unspecified whether esophagitis present - Plan: esomeprazole (NEXIUM) 40 MG capsule  Obesity (BMI 30-39.9)  1. Given her hx of risk factors, will ck arterial duplex. I suspect msk etiology though. Will refer PT now, stretches/exercises for ankle area. 2. Change Protonix back to Nexium. 3. Add Ozempic for above.  F/u in 6 weeks to reck #3.  The patient voiced understanding and agreement to the plan.  [place] Hans[place], DO 02/17/20  11:19 AM

## 2020-02-17 NOTE — Patient Instructions (Signed)
Ice/cold pack over area for 10-15 min twice daily.  Heat (pad or rice pillow in microwave) over affected area, 10-15 minutes twice daily.   If you do not hear anything about your referral in the next 1-2 weeks, call our office and ask for an update.  Ankle Exercises It is normal to feel mild stretching, pulling, tightness, or discomfort as you do these exercises, but you should stop right away if you feel sudden pain or your pain gets worse.  Stretching and range of motion exercises These exercises warm up your muscles and joints and improve the movement and flexibility of your ankle. These exercises also help to relieve pain, numbness, and tingling. Exercise A: Dorsiflexion/Plantar Flexion   1. Sit with your affected knee straight or bent. Do not rest your foot on anything. 2. Flex your affected ankle to tilt the top of your foot toward your shin. 3. Hold this position for 5 seconds. 4. Point your toes downward to tilt the top of your foot away from your shin. 5. Hold this position for 5 seconds. Repeat 2 times. Complete this exercise 3 times per week. Exercise B: Ankle Alphabet   1. Sit with your affected foot supported at your lower leg. ? Do not rest your foot on anything. ? Make sure your foot has room to move freely. 2. Think of your affected foot as a paintbrush, and move your foot to trace each letter of the alphabet in the air. Keep your hip and knee still while you trace. Make the letters as large as you can without increasing any discomfort. 3. Trace every letter from A to Z. Repeat 2 times. Complete this exercise 3 times per week. Strengthening exercises These exercises build strength and endurance in your ankle. Endurance is the ability to use your muscles for a long time, even after they get tired. Exercise D: Dorsiflexors   1. Secure a rubber exercise band or tube to an object, such as a table leg, that will stay still when the band is pulled. Secure the other end  around your affected foot. 2. Sit on the floor, facing the object with your affected leg extended. The band or tube should be slightly tense when your foot is relaxed. 3. Slowly flex your affected ankle and toes to bring your foot toward you. 4. Hold this position for 3 seconds.  5. Slowly return your foot to the starting position, controlling the band as you do that. Do a total of 10 repetitions. Repeat 2 times. Complete this exercise 3 times per week. Exercise E: Plantar Flexors   1. Sit on the floor with your affected leg extended. 2. Loop a rubber exercise band or tube around the ball of your affected foot. The ball of your foot is on the walking surface, right under your toes. The band or tube should be slightly tense when your foot is relaxed. 3. Slowly point your toes downward, pushing them away from you. 4. Hold this position for 3 seconds. 5. Slowly release the tension in the band or tube, controlling smoothly until your foot is back in the starting position. Repeat for a total of 10 repetitions. Repeat 2 times. Complete this exercise 3 times per week. Exercise F: Towel Curls   1. Sit in a chair on a non-carpeted surface, and put your feet on the floor. 2. Place a towel in front of your feet.  3. Keeping your heel on the floor, put your affected foot on the towel. 4. Pull  the towel toward you by grabbing the towel with your toes and curling them under. Keep your heel on the floor. 5. Let your toes relax. 6. Grab the towel again. Keep going until the towel is completely underneath your foot. Repeat for a total of 10 repetitions. Repeat 2 times. Complete this exercise 3 times per week. Exercise G: Heel Raise ( Plantar Flexors, Standing)    1. Stand with your feet shoulder-width apart. 2. Keep your weight spread evenly over the width of your feet while you rise up on your toes. Use a wall or table to steady yourself, but try not to use it for support. 3. If this exercise is  too easy, try these options: ? Shift your weight toward your affected leg until you feel challenged. ? If told by your health care provider, lift your uninjured leg off the floor. 4. Hold this position for 3 seconds. Repeat for a total of 10 repetitions. Repeat 2 times. Complete this exercise 3 times per week. Exercise H: Tandem Walking 1. Stand with one foot directly in front of the other. 2. Slowly raise your back foot up, lifting your heel before your toes, and place it directly in front of your other foot. 3. Continue to walk in this heel-to-toe way for 10 steps or for as long as told by your health care provider. Have a countertop or wall nearby to use if needed to keep your balance, but try not to hold onto anything for support. Repeat 2 times. Complete this exercises 3 times per week. Make sure you discuss any questions you have with your health care provider. Document Released: 06/01/2005 Document Revised: 03/17/2016 Document Reviewed: 04/05/2015 Elsevier Interactive Patient Education  2018 [employer].

## 2020-02-18 ENCOUNTER — Other Ambulatory Visit: Payer: Self-pay | Admitting: Family Medicine

## 2020-02-18 DIAGNOSIS — G8929 Other chronic pain: Secondary | ICD-10-CM

## 2020-02-18 NOTE — Progress Notes (Signed)
[facility]

## 2020-02-19 ENCOUNTER — Other Ambulatory Visit: Payer: Self-pay

## 2020-02-19 ENCOUNTER — Ambulatory Visit (HOSPITAL_COMMUNITY)
Admission: RE | Admit: 2020-02-19 | Discharge: 2020-02-19 | Disposition: A | Discharge status: Home / Self Care | Payer: PPO | Source: Ambulatory Visit | Attending: Vascular Surgery | Admitting: Vascular Surgery

## 2020-02-19 ENCOUNTER — Other Ambulatory Visit: Payer: Self-pay | Admitting: Family Medicine

## 2020-02-19 DIAGNOSIS — I739 Peripheral vascular disease, unspecified: Secondary | ICD-10-CM

## 2020-02-19 DIAGNOSIS — M79604 Pain in right leg: Secondary | ICD-10-CM | POA: Insufficient documentation

## 2020-02-19 DIAGNOSIS — G8929 Other chronic pain: Secondary | ICD-10-CM

## 2020-02-20 ENCOUNTER — Ambulatory Visit: Payer: PPO | Admitting: Rehabilitative and Restorative Service Providers"

## 2020-02-27 ENCOUNTER — Other Ambulatory Visit: Payer: Self-pay

## 2020-03-03 ENCOUNTER — Other Ambulatory Visit: Payer: Self-pay

## 2020-03-03 DIAGNOSIS — I70219 Atherosclerosis of native arteries of extremities with intermittent claudication, unspecified extremity: Secondary | ICD-10-CM

## 2020-03-11 ENCOUNTER — Ambulatory Visit (INDEPENDENT_AMBULATORY_CARE_PROVIDER_SITE_OTHER): Payer: PPO

## 2020-03-11 ENCOUNTER — Other Ambulatory Visit: Payer: Self-pay

## 2020-03-11 DIAGNOSIS — Z1231 Encounter for screening mammogram for malignant neoplasm of breast: Secondary | ICD-10-CM

## 2020-03-11 DIAGNOSIS — M85852 Other specified disorders of bone density and structure, left thigh: Secondary | ICD-10-CM | POA: Diagnosis not present

## 2020-03-11 DIAGNOSIS — M8588 Other specified disorders of bone density and structure, other site: Secondary | ICD-10-CM

## 2020-03-11 DIAGNOSIS — Z780 Asymptomatic menopausal state: Secondary | ICD-10-CM | POA: Diagnosis not present

## 2020-03-12 ENCOUNTER — Ambulatory Visit (HOSPITAL_COMMUNITY)
Admission: RE | Admit: 2020-03-12 | Discharge: 2020-03-12 | Disposition: A | Discharge status: Home / Self Care | Payer: PPO | Source: Ambulatory Visit | Attending: Vascular Surgery | Admitting: Vascular Surgery

## 2020-03-12 DIAGNOSIS — I70219 Atherosclerosis of native arteries of extremities with intermittent claudication, unspecified extremity: Secondary | ICD-10-CM | POA: Diagnosis not present

## 2020-03-13 ENCOUNTER — Other Ambulatory Visit: Payer: Self-pay | Admitting: Family Medicine

## 2020-03-13 DIAGNOSIS — J302 Other seasonal allergic rhinitis: Secondary | ICD-10-CM

## 2020-03-16 ENCOUNTER — Other Ambulatory Visit: Payer: Self-pay | Admitting: *Deleted

## 2020-03-16 ENCOUNTER — Encounter: Payer: Self-pay | Admitting: Surgery

## 2020-03-16 ENCOUNTER — Other Ambulatory Visit: Payer: Self-pay

## 2020-03-16 ENCOUNTER — Encounter: Payer: Self-pay | Admitting: *Deleted

## 2020-03-16 ENCOUNTER — Ambulatory Visit (INDEPENDENT_AMBULATORY_CARE_PROVIDER_SITE_OTHER): Payer: PPO | Admitting: Surgery

## 2020-03-16 VITALS — BP 94/63 | HR 90 | Temp 97.2°F | Resp 20 | Ht 59.0 in | Wt 152.0 lb

## 2020-03-16 DIAGNOSIS — I70213 Atherosclerosis of native arteries of extremities with intermittent claudication, bilateral legs: Secondary | ICD-10-CM | POA: Diagnosis not present

## 2020-03-16 NOTE — H&P (View-Only) (Signed)
 [facility]  Patient name: Brenda Dunn MRN: [id number] DOB: 12/09/1950 Sex: female   REQUESTING PROVIDER:    Dr.Wnedling   REASON FOR CONSULT:    claudication  HISTORY OF PRESENT ILLNESS:   Brenda Dunn is a 69 y.o. female, who is referred for evaluation of leg pain.  The patient states that she will get cramping in both of her legs and calfs with minimal walking.  The right leg bothers her more than the left.  It occurs with less than 50 feet of walking.  She does not endorse rest pain or ulceration.  The patient is a current smoker.  She takes a statin for hypercholesterolemia.  She has a history of PE.  She is not on anticoagulation.  PAST MEDICAL HISTORY    Past Medical History:  Diagnosis Date  . Anxiety   . Benzodiazepine dependence (HCC)   . Colonic polyp    mulitple polyps. repeat in 05/2019  . Constipation    IBS  . COPD (chronic obstructive pulmonary disease) (HCC)   . Depression   . GERD (gastroesophageal reflux disease)   . Hyperlipidemia   . Hypertension   . Insomnia   . Migraine headache   . OAB (overactive bladder)   . Osteoarthritis   . Osteoporosis 12/19/2017  . Post-surgical hypothyroidism   . Pulmonary embolism (HCC)    Eliquis to stop 05/03/19  . Scoliosis      FAMILY HISTORY   Family History  Problem Relation Age of Onset  . Breast cancer Mother   . Arthritis Mother   . Hearing loss Mother   . Hyperlipidemia Mother   . Hypertension Mother   . Anxiety disorder Mother   . Lung cancer Maternal Uncle   . Anxiety disorder Maternal Uncle   . Lung cancer Maternal Grandmother   . Heart attack Maternal Grandmother   . Anxiety disorder Maternal Grandmother   . Asthma Father   . COPD Father   . Hyperlipidemia Father   . Hypertension Father   . Alcohol abuse Father   . Anxiety disorder Sister   . Hypertension Sister   . Thyroid cancer Sister   . Anxiety disorder Brother   .  Hypertension Brother   . Drug abuse Son   . Anxiety disorder Sister   . Hypertension Sister   . Anxiety disorder Sister   . Hypertension Sister   . Hyperlipidemia Son     SOCIAL HISTORY:   Social History   Socioeconomic History  . Marital status: Married    Spouse name: Not on file  . Number of children: Not on file  . Years of education: Not on file  . Highest education level: Not on file  Occupational History  . Occupation: disabled due "my nerves"  Tobacco Use  . Smoking status: Current Every Day Smoker    Packs/day: 1.00    Years: 44.00    Pack years: 44.00    Types: Cigarettes  . Smokeless tobacco: Never Used  Vaping Use  . Vaping Use: Former  Substance and Sexual Activity  . Alcohol use: No  . Drug use: No  . Sexual activity: Not on file  Other Topics Concern  . Not on file  Social History Narrative  . Not on file   Social Determinants of Health   Financial Resource Strain: Low Risk   . Difficulty of Paying Living Expenses: Not hard at all  Food Insecurity: No Food Insecurity  . Worried   About Running Out of Food in the Last Year: Never true  . Ran Out of Food in the Last Year: Never true  Transportation Needs: No Transportation Needs  . Lack of Transportation (Medical): No  . Lack of Transportation (Non-Medical): No  Physical Activity:   . Days of Exercise per Week:   . Minutes of Exercise per Session:   Stress:   . Feeling of Stress :   Social Connections:   . Frequency of Communication with Friends and Family:   . Frequency of Social Gatherings with Friends and Family:   . Attends Religious Services:   . Active Member of Clubs or Organizations:   . Attends Banker Meetings:   Marland Kitchen Marital Status:   Intimate Partner Violence:   . Fear of Current or Ex-Partner:   . Emotionally Abused:   Marland Kitchen Physically Abused:   . Sexually Abused:     ALLERGIES:    No Known Allergies  CURRENT MEDICATIONS:    Current Outpatient Medications    Medication Sig Dispense Refill  . acetaminophen (TYLENOL) 325 MG tablet Take 650 mg by mouth every 6 (six) hours as needed.    Marland Kitchen albuterol (VENTOLIN HFA) 108 (90 Base) MCG/ACT inhaler Inhale 2 puffs into the lungs every 4 (four) hours as needed for wheezing or shortness of breath. 8.8 g 3  . alendronate (FOSAMAX) 70 MG tablet TAKE 1 TABLET EVERY WEEK WITH FULL GLASS OF WATER ON EMPTY STOMACH 12 tablet 3  . atorvastatin (LIPITOR) 40 MG tablet TAKE 1 TABLET BY MOUTH EVERYDAY AT BEDTIME 90 tablet 1  . budesonide-formoterol (SYMBICORT) 80-4.5 MCG/ACT inhaler TAKE 2 PUFFS BY MOUTH TWICE A DAY 3 Inhaler 3  . Calcium Carbonate-Vitamin D (CALTRATE 600+D) 600-400 MG-UNIT tablet Take 1 tablet by mouth 2 (two) times daily. 60 tablet 11  . diazepam (VALIUM) 5 MG tablet Take 1 tablet in the am, 1/2 tablet at lunch and 1 tablet at night 75 tablet 5  . Docusate Calcium (STOOL SOFTENER PO) Take 3 tablets by mouth daily.    Marland Kitchen esomeprazole (NEXIUM) 40 MG capsule Take 1 capsule (40 mg total) by mouth daily. 30 capsule 3  . gabapentin (NEURONTIN) 400 MG capsule TAKE 1 CAPSULE TWICE A DAY AND TAKE 4 CAPSULES AT BEDTIME 540 capsule 1  . levocetirizine (XYZAL) 5 MG tablet TAKE 1 TABLET BY MOUTH EVERY DAY IN THE EVENING 90 tablet 1  . levothyroxine (SYNTHROID) 112 MCG tablet TAKE 1 TABLET BY MOUTH EVERY DAY 90 tablet 1  . LINZESS 290 MCG CAPS capsule TAKE 1 CAPSULE EVERY DAY ON EMPTY STOMACH AT LEAST 30 MINUTES PRIOR TO 1ST MEAL OF THE DAY 90 capsule 1  . lisinopril (ZESTRIL) 40 MG tablet Take 1 tablet (40 mg total) by mouth daily. 90 tablet 1  . mirtazapine (REMERON) 45 MG tablet Take 1 tablet (45 mg total) by mouth at bedtime. 90 tablet 1  . montelukast (SINGULAIR) 10 MG tablet Take 1 tablet (10 mg total) by mouth at bedtime. 90 tablet 2  . MYRBETRIQ 50 MG TB24 tablet TAKE 1 TABLET BY MOUTH EVERY DAY 90 tablet 1  . pantoprazole (PROTONIX) 40 MG tablet Take 1 tablet (40 mg total) by mouth daily. 30 tablet 3  .  Probiotic Product (PROBIOTIC DAILY PO) Take 2 tablets by mouth daily.    . risperiDONE (RISPERDAL) 0.5 MG tablet TAKE 1 TABLET BY MOUTH EVERYDAY AT BEDTIME 90 tablet 1  . Semaglutide,0.25 or 0.5MG /DOS, (OZEMPIC, 0.25 OR 0.5 MG/DOSE,) 2 MG/1.5ML  SOPN Inject 0.1875 mLs (0.25 mg total) into the skin once a week for 28 days, THEN 0.375 mLs (0.5 mg total) once a week for 28 days. 2 pen 1  . tiotropium (SPIRIVA) 18 MCG inhalation capsule Place 1 capsule (18 mcg total) into inhaler and inhale daily. 30 capsule 12  . cyclobenzaprine (FLEXERIL) 10 MG tablet Take 0.5-1 tablets (5-10 mg total) by mouth 3 (three) times daily as needed for muscle spasms. (Patient not taking: Reported on 02/13/2020) 30 tablet 0  . ipratropium-albuterol (DUONEB) 0.5-2.5 (3) MG/3ML SOLN Take 3 mLs by nebulization every 6 (six) hours as needed (wheezing). (Patient not taking: Reported on 02/13/2020) 360 mL 0   No current facility-administered medications for this visit.    REVIEW OF SYSTEMS:   [X]  denotes positive finding, [ ]  denotes negative finding Cardiac  Comments:  Chest pain or chest pressure:    Shortness of breath upon exertion:    Short of breath when lying flat:    Irregular heart rhythm:        Vascular    Pain in calf, thigh, or hip brought on by ambulation: x   Pain in feet at night that wakes you up from your sleep:     Blood clot in your veins:    Leg swelling:         Pulmonary    Oxygen at home:    Productive cough:     Wheezing:         Neurologic    Sudden weakness in arms or legs:     Sudden numbness in arms or legs:     Sudden onset of difficulty speaking or slurred speech:    Temporary loss of vision in one eye:     Problems with dizziness:         Gastrointestinal    Blood in stool:      Vomited blood:         Genitourinary    Burning when urinating:     Blood in urine:        Psychiatric    Major depression:         Hematologic    Bleeding problems:    Problems with blood  clotting too easily:        Skin    Rashes or ulcers:        Constitutional    Fever or chills:     PHYSICAL EXAM:   Vitals:   03/16/20 1338  BP: 94/63  Pulse: 90  Resp: 20  Temp: (!) 97.2 F (36.2 C)  SpO2: 93%  Weight: 152 lb (68.9 kg)  Height: 4\' 11"  (1.499 m)    GENERAL: The patient is a well-nourished female, in no acute distress. The vital signs are documented above. CARDIAC: There is a regular rate and rhythm.  VASCULAR: Palpable left pedal pulse, nonpalpable right PULMONARY: Nonlabored respirations ABDOMEN: Soft and non-tender  MUSCULOSKELETAL: There are no major deformities or cyanosis. NEUROLOGIC: No focal weakness or paresthesias are detected. SKIN: There are no ulcers or rashes noted. PSYCHIATRIC: The patient has a normal affect.  STUDIES:   I have reviewed the following: ABI/TBIToday's ABIToday's TBIPrevious ABIPrevious TBI  +-------+-----------+-----------+------------+------------+  Right 0.95    0.70                  +-------+-----------+-----------+------------+------------+  Left  1.08    .71                  +-------+-----------+-----------+------------+------------+  Right toe:  83 Left toe:  86  LE Duplex: Right: Probable Ilio-femoral arterial occlusive disease, probable tibial  artery occlusive disase.  ASSESSMENT and PLAN   I had a lengthy discussion with the patient and her sister.  Her original ultrasound shows monophasic waveforms on the right leg suggesting a iliac stenosis.  ABIs today are not consistent with that.  However, her toe pressures are dampened.  The patient symptoms certainly could be explained by arterial insufficiency.  I think the best option to decipher this is since she is so debilitated is to proceed with angiography.  I will plan on cannulating the left groin, performing an aortogram with bilateral runoff and intervening if indicated.  This will be scheduled for  Tuesday, August 24.  I did discuss the risks and benefits of the procedure including the risk of bleeding and embolization.   Charlena Cross, MD, FACS [facility] [phone] Pager [phone]

## 2020-03-16 NOTE — Progress Notes (Signed)
[facility] [place]  Patient name: Brenda Dunn MRN: [id number] DOB: 02/28/1951 Sex: female   REQUESTING PROVIDER:    Dr.Wnedling   REASON FOR CONSULT:    claudication  HISTORY OF PRESENT ILLNESS:   Brenda Dunn is a 69 y.o. female, who is referred for evaluation of leg pain.  The patient states that she will get cramping in both of her legs and calfs with minimal walking.  The right leg bothers her more than the left.  It occurs with less than 50 feet of walking.  She does not endorse rest pain or ulceration.  The patient is a current smoker.  She takes a statin for hypercholesterolemia.  She has a history of PE.  She is not on anticoagulation.  PAST MEDICAL HISTORY    Past Medical History:  Diagnosis Date  . Anxiety   . Benzodiazepine dependence (HCC)   . Colonic polyp    mulitple polyps. repeat in 05/2019  . Constipation    IBS  . COPD (chronic obstructive pulmonary disease) (HCC)   . Depression   . GERD (gastroesophageal reflux disease)   . Hyperlipidemia   . Hypertension   . Insomnia   . Migraine headache   . OAB (overactive bladder)   . Osteoarthritis   . Osteoporosis 12/19/2017  . Post-surgical hypothyroidism   . Pulmonary embolism (HCC)    Eliquis to stop 05/03/19  . Scoliosis      FAMILY HISTORY   Family History  Problem Relation Age of Onset  . Breast cancer Mother   . Arthritis Mother   . Hearing loss Mother   . Hyperlipidemia Mother   . Hypertension Mother   . Anxiety disorder Mother   . Lung cancer Maternal Uncle   . Anxiety disorder Maternal Uncle   . Lung cancer Maternal Grandmother   . Heart attack Maternal Grandmother   . Anxiety disorder Maternal Grandmother   . Asthma Father   . COPD Father   . Hyperlipidemia Father   . Hypertension Father   . Alcohol abuse Father   . Anxiety disorder Sister   . Hypertension Sister   . Thyroid cancer Sister   . Anxiety disorder Brother   .  Hypertension Brother   . Drug abuse Son   . Anxiety disorder Sister   . Hypertension Sister   . Anxiety disorder Sister   . Hypertension Sister   . Hyperlipidemia Son     SOCIAL HISTORY:   Social History   Socioeconomic History  . Marital status: Married    Spouse name: Not on file  . Number of children: Not on file  . Years of education: Not on file  . Highest education level: Not on file  Occupational History  . Occupation: disabled due "my nerves"  Tobacco Use  . Smoking status: Current Every Day Smoker    Packs/day: 1.00    Years: 44.00    Pack years: 44.00    Types: Cigarettes  . Smokeless tobacco: Never Used  Vaping Use  . Vaping Use: Former  Substance and Sexual Activity  . Alcohol use: No  . Drug use: No  . Sexual activity: Not on file  Other Topics Concern  . Not on file  Social History Narrative  . Not on file   Social Determinants of Health   Financial Resource Strain: Low Risk   . Difficulty of Paying Living Expenses: Not hard at all  Food Insecurity: No Food Insecurity  . Worried  About Running Out of Food in the Last Year: Never true  . Ran Out of Food in the Last Year: Never true  Transportation Needs: No Transportation Needs  . Lack of Transportation (Medical): No  . Lack of Transportation (Non-Medical): No  Physical Activity:   . Days of Exercise per Week:   . Minutes of Exercise per Session:   Stress:   . Feeling of Stress :   Social Connections:   . Frequency of Communication with Friends and Family:   . Frequency of Social Gatherings with Friends and Family:   . Attends Religious Services:   . Active Member of Clubs or Organizations:   . Attends Banker Meetings:   Marland Kitchen Marital Status:   Intimate Partner Violence:   . Fear of Current or Ex-Partner:   . Emotionally Abused:   Marland Kitchen Physically Abused:   . Sexually Abused:     ALLERGIES:    No Known Allergies  CURRENT MEDICATIONS:    Current Outpatient Medications    Medication Sig Dispense Refill  . acetaminophen (TYLENOL) 325 MG tablet Take 650 mg by mouth every 6 (six) hours as needed.    Marland Kitchen albuterol (VENTOLIN HFA) 108 (90 Base) MCG/ACT inhaler Inhale 2 puffs into the lungs every 4 (four) hours as needed for wheezing or shortness of breath. 8.8 g 3  . alendronate (FOSAMAX) 70 MG tablet TAKE 1 TABLET EVERY WEEK WITH FULL GLASS OF WATER ON EMPTY STOMACH 12 tablet 3  . atorvastatin (LIPITOR) 40 MG tablet TAKE 1 TABLET BY MOUTH EVERYDAY AT BEDTIME 90 tablet 1  . budesonide-formoterol (SYMBICORT) 80-4.5 MCG/ACT inhaler TAKE 2 PUFFS BY MOUTH TWICE A DAY 3 Inhaler 3  . Calcium Carbonate-Vitamin D (CALTRATE 600+D) 600-400 MG-UNIT tablet Take 1 tablet by mouth 2 (two) times daily. 60 tablet 11  . diazepam (VALIUM) 5 MG tablet Take 1 tablet in the am, 1/2 tablet at lunch and 1 tablet at night 75 tablet 5  . Docusate Calcium (STOOL SOFTENER PO) Take 3 tablets by mouth daily.    Marland Kitchen esomeprazole (NEXIUM) 40 MG capsule Take 1 capsule (40 mg total) by mouth daily. 30 capsule 3  . gabapentin (NEURONTIN) 400 MG capsule TAKE 1 CAPSULE TWICE A DAY AND TAKE 4 CAPSULES AT BEDTIME 540 capsule 1  . levocetirizine (XYZAL) 5 MG tablet TAKE 1 TABLET BY MOUTH EVERY DAY IN THE EVENING 90 tablet 1  . levothyroxine (SYNTHROID) 112 MCG tablet TAKE 1 TABLET BY MOUTH EVERY DAY 90 tablet 1  . LINZESS 290 MCG CAPS capsule TAKE 1 CAPSULE EVERY DAY ON EMPTY STOMACH AT LEAST 30 MINUTES PRIOR TO 1ST MEAL OF THE DAY 90 capsule 1  . lisinopril (ZESTRIL) 40 MG tablet Take 1 tablet (40 mg total) by mouth daily. 90 tablet 1  . mirtazapine (REMERON) 45 MG tablet Take 1 tablet (45 mg total) by mouth at bedtime. 90 tablet 1  . montelukast (SINGULAIR) 10 MG tablet Take 1 tablet (10 mg total) by mouth at bedtime. 90 tablet 2  . MYRBETRIQ 50 MG TB24 tablet TAKE 1 TABLET BY MOUTH EVERY DAY 90 tablet 1  . pantoprazole (PROTONIX) 40 MG tablet Take 1 tablet (40 mg total) by mouth daily. 30 tablet 3  .  Probiotic Product (PROBIOTIC DAILY PO) Take 2 tablets by mouth daily.    . risperiDONE (RISPERDAL) 0.5 MG tablet TAKE 1 TABLET BY MOUTH EVERYDAY AT BEDTIME 90 tablet 1  . Semaglutide,0.25 or 0.5MG /DOS, (OZEMPIC, 0.25 OR 0.5 MG/DOSE,) 2 MG/1.5ML  SOPN Inject 0.1875 mLs (0.25 mg total) into the skin once a week for 28 days, THEN 0.375 mLs (0.5 mg total) once a week for 28 days. 2 pen 1  . tiotropium (SPIRIVA) 18 MCG inhalation capsule Place 1 capsule (18 mcg total) into inhaler and inhale daily. 30 capsule 12  . cyclobenzaprine (FLEXERIL) 10 MG tablet Take 0.5-1 tablets (5-10 mg total) by mouth 3 (three) times daily as needed for muscle spasms. (Patient not taking: Reported on 02/13/2020) 30 tablet 0  . ipratropium-albuterol (DUONEB) 0.5-2.5 (3) MG/3ML SOLN Take 3 mLs by nebulization every 6 (six) hours as needed (wheezing). (Patient not taking: Reported on 02/13/2020) 360 mL 0   No current facility-administered medications for this visit.    REVIEW OF SYSTEMS:   [X]  denotes positive finding, [ ]  denotes negative finding Cardiac  Comments:  Chest pain or chest pressure:    Shortness of breath upon exertion:    Short of breath when lying flat:    Irregular heart rhythm:        Vascular    Pain in calf, thigh, or hip brought on by ambulation: x   Pain in feet at night that wakes you up from your sleep:     Blood clot in your veins:    Leg swelling:         Pulmonary    Oxygen at home:    Productive cough:     Wheezing:         Neurologic    Sudden weakness in arms or legs:     Sudden numbness in arms or legs:     Sudden onset of difficulty speaking or slurred speech:    Temporary loss of vision in one eye:     Problems with dizziness:         Gastrointestinal    Blood in stool:      Vomited blood:         Genitourinary    Burning when urinating:     Blood in urine:        Psychiatric    Major depression:         Hematologic    Bleeding problems:    Problems with blood  clotting too easily:        Skin    Rashes or ulcers:        Constitutional    Fever or chills:     PHYSICAL EXAM:   Vitals:   03/16/20 1338  BP: 94/63  Pulse: 90  Resp: 20  Temp: (!) 97.2 F (36.2 C)  SpO2: 93%  Weight: 152 lb (68.9 kg)  Height: 4\' 11"  (1.499 m)    GENERAL: The patient is a well-nourished female, in no acute distress. The vital signs are documented above. CARDIAC: There is a regular rate and rhythm.  VASCULAR: Palpable left pedal pulse, nonpalpable right PULMONARY: Nonlabored respirations ABDOMEN: Soft and non-tender  MUSCULOSKELETAL: There are no major deformities or cyanosis. NEUROLOGIC: No focal weakness or paresthesias are detected. SKIN: There are no ulcers or rashes noted. PSYCHIATRIC: The patient has a normal affect.  STUDIES:   I have reviewed the following: ABI/TBIToday's ABIToday's TBIPrevious ABIPrevious TBI  +-------+-----------+-----------+------------+------------+  Right 0.95    0.70                  +-------+-----------+-----------+------------+------------+  Left  1.08    .71                  +-------+-----------+-----------+------------+------------+  Right toe:  83 Left toe:  86  LE Duplex: Right: Probable Ilio-femoral arterial occlusive disease, probable tibial  artery occlusive disase.  ASSESSMENT and PLAN   I had a lengthy discussion with the patient and her sister.  Her original ultrasound shows monophasic waveforms on the right leg suggesting a iliac stenosis.  ABIs today are not consistent with that.  However, her toe pressures are dampened.  The patient symptoms certainly could be explained by arterial insufficiency.  I think the best option to decipher this is since she is so debilitated is to proceed with angiography.  I will plan on cannulating the left groin, performing an aortogram with bilateral runoff and intervening if indicated.  This will be scheduled for  Tuesday, August 24.  I did discuss the risks and benefits of the procedure including the risk of bleeding and embolization.   Charlena Cross, MD, FACS [facility] [phone] Pager [phone]

## 2020-03-17 ENCOUNTER — Telehealth: Payer: Self-pay | Admitting: Family Medicine

## 2020-03-17 NOTE — Telephone Encounter (Signed)
Called informed of PCP instructions. She verbalized understanding/agreed to try Tucks

## 2020-03-17 NOTE — Telephone Encounter (Signed)
CallerNorman Dunn Call back # [phone]   Patient states  has hemorrhoid since 03/10/20. Patient try preparation H, epsom salt, baby diaper rash, hot towel with no relief. Last Bowel movent was 3 days ago.  [facility] [phone] [name], [name] - [address]  [address] [place], [place] [address]  Phone:  [phone] Fax:  [phone]

## 2020-03-17 NOTE — Telephone Encounter (Signed)
Call patient called again stating she is hurting back. Patient is hoping you could send something to the pharmacy.

## 2020-03-17 NOTE — Telephone Encounter (Signed)
Patient just called back and wants something to help with her hemorrhoids' She states they have not reduced in size.

## 2020-03-17 NOTE — Telephone Encounter (Signed)
Try Tuck's pads. It's OTC. Ty.

## 2020-03-23 ENCOUNTER — Telehealth: Payer: Self-pay | Admitting: Family Medicine

## 2020-03-23 ENCOUNTER — Other Ambulatory Visit (HOSPITAL_COMMUNITY)
Admission: RE | Admit: 2020-03-23 | Discharge: 2020-03-23 | Disposition: A | Discharge status: Home / Self Care | Payer: PPO | Source: Ambulatory Visit | Attending: Surgery | Admitting: Surgery

## 2020-03-23 DIAGNOSIS — Z20822 Contact with and (suspected) exposure to covid-19: Secondary | ICD-10-CM | POA: Diagnosis not present

## 2020-03-23 DIAGNOSIS — Z01812 Encounter for preprocedural laboratory examination: Secondary | ICD-10-CM | POA: Insufficient documentation

## 2020-03-23 LAB — SARS CORONAVIRUS 2 (TAT 6-24 HRS): SARS Coronavirus 2: NEGATIVE

## 2020-03-23 MED ORDER — BUDESONIDE-FORMOTEROL FUMARATE 80-4.5 MCG/ACT IN AERO: INHALATION_SPRAY | RESPIRATORY_TRACT | 0 refills | Status: DC

## 2020-03-23 NOTE — Telephone Encounter (Signed)
Use Vaseline. Ty.

## 2020-03-23 NOTE — Telephone Encounter (Signed)
Patient states she tried the [employer] for  hemorrhoidsand the size of hemorrhoids give went down, patient now states she is raw. Patient is requesting someone send in a medication to help her.

## 2020-03-23 NOTE — Addendum Note (Signed)
Addended by: Scharlene Gloss B on: 03/23/2020 01:00 PM   Modules accepted: Orders

## 2020-03-23 NOTE — Telephone Encounter (Signed)
Patient informed of PCP instructions. 

## 2020-03-24 ENCOUNTER — Ambulatory Visit (HOSPITAL_COMMUNITY)
Admission: RE | Admit: 2020-03-24 | Discharge: 2020-03-24 | Disposition: A | Discharge status: Home / Self Care | Payer: PPO | Attending: Surgery | Admitting: Surgery

## 2020-03-24 ENCOUNTER — Encounter (HOSPITAL_COMMUNITY)
Admission: RE | Disposition: A | Discharge status: Home / Self Care | Payer: Self-pay | Source: Home / Self Care | Attending: Surgery

## 2020-03-24 ENCOUNTER — Other Ambulatory Visit: Payer: Self-pay

## 2020-03-24 DIAGNOSIS — G4700 Insomnia, unspecified: Secondary | ICD-10-CM | POA: Diagnosis not present

## 2020-03-24 DIAGNOSIS — F17210 Nicotine dependence, cigarettes, uncomplicated: Secondary | ICD-10-CM | POA: Diagnosis not present

## 2020-03-24 DIAGNOSIS — Z79890 Hormone replacement therapy (postmenopausal): Secondary | ICD-10-CM | POA: Diagnosis not present

## 2020-03-24 DIAGNOSIS — M810 Age-related osteoporosis without current pathological fracture: Secondary | ICD-10-CM | POA: Insufficient documentation

## 2020-03-24 DIAGNOSIS — M419 Scoliosis, unspecified: Secondary | ICD-10-CM | POA: Insufficient documentation

## 2020-03-24 DIAGNOSIS — I10 Essential (primary) hypertension: Secondary | ICD-10-CM | POA: Insufficient documentation

## 2020-03-24 DIAGNOSIS — M1990 Unspecified osteoarthritis, unspecified site: Secondary | ICD-10-CM | POA: Insufficient documentation

## 2020-03-24 DIAGNOSIS — E890 Postprocedural hypothyroidism: Secondary | ICD-10-CM | POA: Insufficient documentation

## 2020-03-24 DIAGNOSIS — Z7983 Long term (current) use of bisphosphonates: Secondary | ICD-10-CM | POA: Diagnosis not present

## 2020-03-24 DIAGNOSIS — Z825 Family history of asthma and other chronic lower respiratory diseases: Secondary | ICD-10-CM | POA: Diagnosis not present

## 2020-03-24 DIAGNOSIS — I70213 Atherosclerosis of native arteries of extremities with intermittent claudication, bilateral legs: Secondary | ICD-10-CM | POA: Diagnosis not present

## 2020-03-24 DIAGNOSIS — J449 Chronic obstructive pulmonary disease, unspecified: Secondary | ICD-10-CM | POA: Diagnosis not present

## 2020-03-24 DIAGNOSIS — Z8249 Family history of ischemic heart disease and other diseases of the circulatory system: Secondary | ICD-10-CM | POA: Insufficient documentation

## 2020-03-24 DIAGNOSIS — Z79899 Other long term (current) drug therapy: Secondary | ICD-10-CM | POA: Insufficient documentation

## 2020-03-24 DIAGNOSIS — I70223 Atherosclerosis of native arteries of extremities with rest pain, bilateral legs: Secondary | ICD-10-CM | POA: Insufficient documentation

## 2020-03-24 DIAGNOSIS — K589 Irritable bowel syndrome without diarrhea: Secondary | ICD-10-CM | POA: Diagnosis not present

## 2020-03-24 DIAGNOSIS — E785 Hyperlipidemia, unspecified: Secondary | ICD-10-CM | POA: Insufficient documentation

## 2020-03-24 DIAGNOSIS — Z86711 Personal history of pulmonary embolism: Secondary | ICD-10-CM | POA: Insufficient documentation

## 2020-03-24 HISTORY — PX: PERIPHERAL VASCULAR INTERVENTION: CATH118257

## 2020-03-24 HISTORY — PX: ABDOMINAL AORTOGRAM W/LOWER EXTREMITY: CATH118223

## 2020-03-24 LAB — POCT I-STAT, CHEM 8
BUN: 23 mg/dL (ref 8–23)
Calcium, Ion: 1.22 mmol/L (ref 1.15–1.40)
Chloride: 104 mmol/L (ref 98–111)
Creatinine, Ser: 0.8 mg/dL (ref 0.44–1.00)
Glucose, Bld: 97 mg/dL (ref 70–99)
HCT: 33 % — ABNORMAL LOW (ref 36.0–46.0)
Hemoglobin: 11.2 g/dL — ABNORMAL LOW (ref 12.0–15.0)
Potassium: 4.4 mmol/L (ref 3.5–5.1)
Sodium: 137 mmol/L (ref 135–145)
TCO2: 25 mmol/L (ref 22–32)

## 2020-03-24 SURGERY — ABDOMINAL AORTOGRAM W/LOWER EXTREMITY
Anesthesia: LOCAL | Laterality: Bilateral

## 2020-03-24 MED ORDER — HEPARIN (PORCINE) IN NACL 1000-0.9 UT/500ML-% IV SOLN
INTRAVENOUS | Status: AC
  Filled 2020-03-24: qty 500

## 2020-03-24 MED ORDER — LABETALOL HCL 5 MG/ML IV SOLN: 10.0000 mg | INTRAVENOUS | Status: DC | PRN

## 2020-03-24 MED ORDER — HEPARIN (PORCINE) IN NACL 1000-0.9 UT/500ML-% IV SOLN
INTRAVENOUS | Status: DC | PRN
  Administered 2020-03-24 (×2): 500 mL

## 2020-03-24 MED ORDER — CLOPIDOGREL BISULFATE 75 MG PO TABS
ORAL_TABLET | ORAL | Status: DC | PRN
  Administered 2020-03-24: 75 mg via ORAL

## 2020-03-24 MED ORDER — SODIUM CHLORIDE 0.9 % IV SOLN: 250.0000 mL | INTRAVENOUS | Status: DC | PRN

## 2020-03-24 MED ORDER — ASPIRIN EC 81 MG PO TBEC: 81.0000 mg | DELAYED_RELEASE_TABLET | Freq: Every day | ORAL | Status: DC

## 2020-03-24 MED ORDER — SODIUM CHLORIDE 0.9 % IV SOLN: INTRAVENOUS | Status: DC

## 2020-03-24 MED ORDER — CLOPIDOGREL BISULFATE 75 MG PO TABS: 75.0000 mg | ORAL_TABLET | Freq: Every day | ORAL | Status: DC

## 2020-03-24 MED ORDER — HYDRALAZINE HCL 20 MG/ML IJ SOLN: 5.0000 mg | INTRAMUSCULAR | Status: DC | PRN

## 2020-03-24 MED ORDER — SODIUM CHLORIDE 0.9 % WEIGHT BASED INFUSION: 1.0000 mL/kg/h | INTRAVENOUS | Status: DC

## 2020-03-24 MED ORDER — ASPIRIN EC 81 MG PO TBEC: 81.0000 mg | DELAYED_RELEASE_TABLET | Freq: Every day | ORAL | 2 refills | Status: DC

## 2020-03-24 MED ORDER — IODIXANOL 320 MG/ML IV SOLN
INTRAVENOUS | Status: DC | PRN
  Administered 2020-03-24: 190 mL

## 2020-03-24 MED ORDER — ONDANSETRON HCL 4 MG/2ML IJ SOLN: 4.0000 mg | Freq: Four times a day (QID) | INTRAMUSCULAR | Status: DC | PRN

## 2020-03-24 MED ORDER — CLOPIDOGREL BISULFATE 75 MG PO TABS
ORAL_TABLET | ORAL | Status: AC
  Filled 2020-03-24: qty 1

## 2020-03-24 MED ORDER — MIDAZOLAM HCL 2 MG/2ML IJ SOLN
INTRAMUSCULAR | Status: DC | PRN
  Administered 2020-03-24 (×2): 1 mg via INTRAVENOUS

## 2020-03-24 MED ORDER — CLOPIDOGREL BISULFATE 75 MG PO TABS: 75.0000 mg | ORAL_TABLET | Freq: Every day | ORAL | 11 refills | Status: DC

## 2020-03-24 MED ORDER — LIDOCAINE HCL (PF) 1 % IJ SOLN
INTRAMUSCULAR | Status: DC | PRN
  Administered 2020-03-24: 18 mL

## 2020-03-24 MED ORDER — SODIUM CHLORIDE 0.9% FLUSH: 3.0000 mL | INTRAVENOUS | Status: DC | PRN

## 2020-03-24 MED ORDER — FENTANYL CITRATE (PF) 100 MCG/2ML IJ SOLN
INTRAMUSCULAR | Status: AC
  Filled 2020-03-24: qty 2

## 2020-03-24 MED ORDER — ACETAMINOPHEN 325 MG PO TABS: 650.0000 mg | ORAL_TABLET | ORAL | Status: DC | PRN

## 2020-03-24 MED ORDER — FENTANYL CITRATE (PF) 100 MCG/2ML IJ SOLN
INTRAMUSCULAR | Status: DC | PRN
  Administered 2020-03-24 (×2): 25 ug via INTRAVENOUS

## 2020-03-24 MED ORDER — SODIUM CHLORIDE 0.9% FLUSH: 3.0000 mL | Freq: Two times a day (BID) | INTRAVENOUS | Status: DC

## 2020-03-24 MED ORDER — LIDOCAINE HCL (PF) 1 % IJ SOLN
INTRAMUSCULAR | Status: AC
  Filled 2020-03-24: qty 30

## 2020-03-24 MED ORDER — HEPARIN SODIUM (PORCINE) 1000 UNIT/ML IJ SOLN
INTRAMUSCULAR | Status: DC | PRN
  Administered 2020-03-24: 7000 [IU] via INTRAVENOUS

## 2020-03-24 MED ORDER — MIDAZOLAM HCL 2 MG/2ML IJ SOLN
INTRAMUSCULAR | Status: AC
  Filled 2020-03-24: qty 2

## 2020-03-24 SURGICAL SUPPLY — 20 items
CATH ANGIO 5F BER2 65CM (CATHETERS) ×1 IMPLANT
CATH OMNI FLUSH 5F 65CM (CATHETERS) ×1 IMPLANT
CLOSURE MYNX CONTROL 6F/7F (Vascular Products) ×1 IMPLANT
GUIDEWIRE ANGLED .035X150CM (WIRE) ×1 IMPLANT
KIT ENCORE 26 ADVANTAGE (KITS) ×1 IMPLANT
KIT MICROPUNCTURE NIT STIFF (SHEATH) ×1 IMPLANT
KIT PV (KITS) ×2 IMPLANT
SHEATH PINNACLE 5F 10CM (SHEATH) ×1 IMPLANT
SHEATH PINNACLE 6F 10CM (SHEATH) ×1 IMPLANT
SHEATH PINNACLE ST 6F 45CM (SHEATH) ×1 IMPLANT
SHEATH PROBE COVER 6X72 (BAG) ×1 IMPLANT
STENT OMNILINK ELITE 8X19X135 (Permanent Stent) ×1 IMPLANT
STENT OMNILINK ELITE 8X29X80 (Permanent Stent) ×1 IMPLANT
STENT OMNILINK ELITE 9X29X80 (Permanent Stent) IMPLANT
SYR MEDRAD MARK 7 150ML (SYRINGE) ×2 IMPLANT
TRANSDUCER W/STOPCOCK (MISCELLANEOUS) ×2 IMPLANT
TRAY PV CATH (CUSTOM PROCEDURE TRAY) ×2 IMPLANT
WIRE AMPLATZ SS-J .035X180CM (WIRE) ×1 IMPLANT
WIRE BENTSON .035X145CM (WIRE) ×1 IMPLANT
WIRE ROSEN-J .035X260CM (WIRE) ×1 IMPLANT

## 2020-03-24 NOTE — Interval H&P Note (Signed)
History and Physical Interval Note:  03/24/2020 12:28 PM  Brenda Dunn  has presented today for surgery, with the diagnosis of pvd.  The various methods of treatment have been discussed with the patient and family. After consideration of risks, benefits and other options for treatment, the patient has consented to  Procedure(s): ABDOMINAL AORTOGRAM W/LOWER EXTREMITY (N/A) as a surgical intervention.  The patient's history has been reviewed, patient examined, no change in status, stable for surgery.  I have reviewed the patient's chart and labs.  Questions were answered to the patient's satisfaction.     Durene Cal

## 2020-03-24 NOTE — Discharge Instructions (Signed)
Femoral Site Care This sheet gives you information about how to care for yourself after your procedure. Your health care provider may also give you more specific instructions. If you have problems or questions, contact your health care provider. What can I expect after the procedure? After the procedure, it is common to have:  Bruising that usually fades within 1-2 weeks.  Tenderness at the site. Follow these instructions at home: Wound care  Follow instructions from your health care provider about how to take care of your insertion site. Make sure you: ? Wash your hands with soap and water before you change your bandage (dressing). If soap and water are not available, use hand sanitizer. ? Change your dressing as told by your health care provider. ? Leave stitches (sutures), skin glue, or adhesive strips in place. These skin closures may need to stay in place for 2 weeks or longer. If adhesive strip edges start to loosen and curl up, you may trim the loose edges. Do not remove adhesive strips completely unless your health care provider tells you to do that.  Do not take baths, swim, or use a hot tub until your health care provider approves.  You may shower 24-48 hours after the procedure or as told by your health care provider. ? Gently wash the site with plain soap and water. ? Pat the area dry with a clean towel. ? Do not rub the site. This may cause bleeding.  Do not apply powder or lotion to the site. Keep the site clean and dry.  Check your femoral site every day for signs of infection. Check for: ? Redness, swelling, or pain. ? Fluid or blood. ? Warmth. ? Pus or a bad smell. Activity  For the first 2-3 days after your procedure, or as long as directed: ? Avoid climbing stairs as much as possible. ? Do not squat.  Do not lift anything that is heavier than 10 lb (4.5 kg), or the limit that you are told, until your health care provider says that it is safe.  Rest as  directed. ? Avoid sitting for a long time without moving. Get up to take short walks every 1-2 hours.  Do not drive for 24 hours if you were given a medicine to help you relax (sedative). General instructions  Take over-the-counter and prescription medicines only as told by your health care provider.  Keep all follow-up visits as told by your health care provider. This is important. Contact a health care provider if you have:  A fever or chills.  You have redness, swelling, or pain around your insertion site. Get help right away if:  The catheter insertion area swells very fast.  You pass out.  You suddenly start to sweat or your skin gets clammy.  The catheter insertion area is bleeding, and the bleeding does not stop when you hold steady pressure on the area.  The area near or just beyond the catheter insertion site becomes pale, cool, tingly, or numb. These symptoms may represent a serious problem that is an emergency. Do not wait to see if the symptoms will go away. Get medical help right away. Call your local emergency services (911 in the [place] Do not drive yourself to the hospital. Summary  After the procedure, it is common to have bruising that usually fades within 1-2 weeks.  Check your femoral site every day for signs of infection.  Do not lift anything that is heavier than 10 lb (4.5 kg), or the   limit that you are told, until your health care provider says that it is safe. This information is not intended to replace advice given to you by your health care provider. Make sure you discuss any questions you have with your health care provider. Document Revised: 07/31/2017 Document Reviewed: 07/31/2017 Elsevier Patient Education  2020 Elsevier Inc.  

## 2020-03-24 NOTE — Progress Notes (Signed)
Pt seems to be intoxicated/ slight slur or drag on speech, using profanity, neediness on O2 needs/asthma HX  but sats are 100%. Spoke with Janne Napoleon RN/ cath lab, she assessed, no further orders followed. Pt can answer questions appropriately. Asking her what she is having done she states he is going in my cervix to scrape plaque, also states she was to be in procedure 30 minutes ago and was going to bill the Dr. I explained her procedure was not scheduled for another 1 hour. She stated she was to be here 30 minutes before procedure, I re explained it was scheduled at 10;30am. Pt was excepting of times and information.

## 2020-03-24 NOTE — Op Note (Signed)
Patient name: Brenda Dunn MRN: [id number] DOB: September 28, 1950 Sex: female  03/24/2020 Pre-operative Diagnosis: Bilateral claudication Post-operative diagnosis:  Same Surgeon:  Annamarie Major Procedure Performed:  1. Ultrasound-guided access, left femoral artery  2. Abdominal aortogram  3. Bilateral lower extremity runoff  4. Stent, right common iliac artery  5. Stent, left common iliac artery  6. Conscious sedation, 78 minutes  7. Closure device, Mynx   Indications: The patient has been complaining of claudication-like symptoms. She comes in today for angiographic evaluation  Procedure:  The patient was identified in the holding area and taken to room 8.  The patient was then placed supine on the table and prepped and draped in the usual sterile fashion.  A time out was called.  Conscious sedation was administered with the use of IV fentanyl and Versed under continuous physician and nurse monitoring.  Heart rate, blood pressure, and oxygen saturation were continuously monitored.  Total sedation time was 78 minutes.  Ultrasound was used to evaluate the left common femoral artery.  It was patent .  A digital ultrasound image was acquired.  A micropuncture needle was used to access the left common femoral artery under ultrasound guidance.  An 018 wire was advanced without resistance and a micropuncture sheath was placed.  The 018 wire was removed and a benson wire was placed.  The micropuncture sheath was exchanged for a 5 french sheath.  An omniflush catheter was advanced over the wire to the level of L-1.  An abdominal angiogram was obtained.   Next, the catheter was brought down to the aortic bifurcation and bilateral runoff was performed Findings:   Aortogram: No significant renal artery stenosis was identified. No significant infrarenal aortic stenosis was identified. There was luminal narrowing within the right common iliac artery, greater than 50%. The right external iliac artery was widely  patent. The left common iliac artery had a stenosis of greater than 60%. The external iliac artery was without significant stenosis.  Right Lower Extremity: The right common femoral profundofemoral and superficial femoral artery are widely patent. There is mild stenosis within the adductor canal. The popliteal artery is widely patent. There is three-vessel runoff.  Left Lower Extremity: The left common femoral profundofemoral and superficial femoral artery are widely patent. There is mild stenosis within the adductor canal. The popliteal artery is widely patent. There is three-vessel runoff.  Intervention: After the above images were acquired the decision was made to proceed with intervention. The aortic bifurcation was crossed and a Amplatz superstiff wire was inserted. I then advanced a 6 x 45 cm Terumo sheath into the right common iliac artery. The patient is fully heparinized. I elected to primarily stent the right common iliac lesion. A 8 x 29 Omnilink stent was deployed in the common iliac artery taken to nominal pressure. Resolution of the stenosis was noted on follow-up imaging. Next attention was turned towards the left common iliac artery. A 8 x 19 Omnilink stent was deployed landing at the origin of the artery. This was taken to 14 atm. Completion imaging showed resolution of the stenosis. At this point catheters wires were removed. The long sheath was exchanged out for a short 6 French sheath and a minx was used for closure.  Impression:  #1 bilateral greater than 60% common iliac stenosis successfully treated using balloon expandable 8 mm stents  #2 no significant outflow or runoff stenosis.   Theotis Burrow, M.D., [facility] [place] Office: [phone] Pager:  [phone]

## 2020-03-25 ENCOUNTER — Encounter (HOSPITAL_COMMUNITY): Payer: Self-pay | Admitting: Surgery

## 2020-03-25 ENCOUNTER — Telehealth: Payer: Self-pay

## 2020-03-25 NOTE — Telephone Encounter (Signed)
Pt called with questions regarding dressing removal and cleaning area. Instructed her to call us if she has any swelling, drainage, odor, etc. Pt verbalized understanding.

## 2020-03-26 ENCOUNTER — Telehealth: Payer: Self-pay | Admitting: Family Medicine

## 2020-03-26 NOTE — Telephone Encounter (Signed)
New Message:   Pt is calling and states she would like to know if something can be called in for her hemorrhoids pt states she has been dealing with them for 2 weeks now. Pt states she has used everything suggested by the doctor and nothing is working. Please advise.

## 2020-03-27 ENCOUNTER — Ambulatory Visit: Payer: PPO | Admitting: Family Medicine

## 2020-03-27 NOTE — Telephone Encounter (Signed)
Sched appt plz. Ty. 

## 2020-03-27 NOTE — Telephone Encounter (Signed)
Informed and offered her an appt She refused due to the pain and unable to get in/out of the car Started using cortisone last night per pharmacist recommended. She applied it 4 times yesterday and twice to day so far.   Advise if anything else to do//prescribe...she had surgery on her legs on Tuesday and unable to come in.

## 2020-04-01 ENCOUNTER — Other Ambulatory Visit: Payer: Self-pay | Admitting: Family Medicine

## 2020-04-01 DIAGNOSIS — E669 Obesity, unspecified: Secondary | ICD-10-CM

## 2020-04-08 ENCOUNTER — Other Ambulatory Visit: Payer: Self-pay | Admitting: Psychiatry

## 2020-04-08 DIAGNOSIS — F39 Unspecified mood [affective] disorder: Secondary | ICD-10-CM

## 2020-04-09 NOTE — Telephone Encounter (Signed)
Please review

## 2020-04-21 ENCOUNTER — Other Ambulatory Visit: Payer: Self-pay

## 2020-04-21 DIAGNOSIS — I70213 Atherosclerosis of native arteries of extremities with intermittent claudication, bilateral legs: Secondary | ICD-10-CM

## 2020-04-21 DIAGNOSIS — I771 Stricture of artery: Secondary | ICD-10-CM

## 2020-04-22 ENCOUNTER — Encounter: Payer: Self-pay | Admitting: Psychiatry

## 2020-04-22 ENCOUNTER — Telehealth (INDEPENDENT_AMBULATORY_CARE_PROVIDER_SITE_OTHER): Payer: PPO | Admitting: Psychiatry

## 2020-04-22 ENCOUNTER — Telehealth: Payer: Self-pay | Admitting: Psychiatry

## 2020-04-22 DIAGNOSIS — F5101 Primary insomnia: Secondary | ICD-10-CM | POA: Diagnosis not present

## 2020-04-22 DIAGNOSIS — F411 Generalized anxiety disorder: Secondary | ICD-10-CM

## 2020-04-22 DIAGNOSIS — F39 Unspecified mood [affective] disorder: Secondary | ICD-10-CM

## 2020-04-22 MED ORDER — DIAZEPAM 5 MG PO TABS: ORAL_TABLET | ORAL | 5 refills | Status: DC

## 2020-04-22 MED ORDER — GABAPENTIN 400 MG PO CAPS: ORAL_CAPSULE | ORAL | 1 refills | Status: DC

## 2020-04-22 MED ORDER — MIRTAZAPINE 45 MG PO TABS: 45.0000 mg | ORAL_TABLET | Freq: Every day | ORAL | 1 refills | Status: DC

## 2020-04-22 MED ORDER — RISPERIDONE 1 MG PO TABS: 1.0000 mg | ORAL_TABLET | Freq: Every day | ORAL | 5 refills | Status: DC

## 2020-04-22 NOTE — Progress Notes (Signed)
Brenda Dunn [id number] [birth year] 69 y.o.  Virtual Visit via Telephone Note  I connected with pt on 04/22/20. At  8:30 AM EDT by telephone and verified that I am speaking with the correct person using two identifiers.   I discussed the limitations, risks, security and privacy concerns of performing an evaluation and management service by telephone and the availability of in person appointments. I also discussed with the patient that there may be a patient responsible charge related to this service. The patient expressed understanding and agreed to proceed.   I discussed the assessment and treatment plan with the patient. The patient was provided an opportunity to ask questions and all were answered. The patient agreed with the plan and demonstrated an understanding of the instructions.   The patient was advised to call back or seek an in-person evaluation if the symptoms worsen or if the condition fails to improve as anticipated.  I provided 30 minutes of non-face-to-face time during this encounter.  The patient was located at home.  The provider was located at [facility].   Corie Chiquito, PMHNP   Subjective:   Patient ID:  Brenda Dunn is a 69 y.o. (DOB Feb 09, 1951) female.  Chief Complaint:  Chief Complaint  Patient presents with  . Anxiety  . Other    Mood disturbance  . Sleeping Problem    HPI Brenda Dunn presents for follow-up of anxiety, mood disturbance, and insomnia. Granddaughter overdosed about a month ago on Fentanyl and codeine. She reports that she was the medical point of contact for granddaughter and was also being harassed by granddaughter's ex-husband's family. Pt reports that she was irritable at that time in response to increased stressors. Pt reports that she ended up blocking communication with multiple family members. She reports that she was not sleeping well and continues to have disrupted sleep. She reports that she experiences intermittent  awakenings. Occasionally has difficulty returning to sleep.   Granddaughter died on 2020-04-28. Husband has an aneurysm and a blockage in his leg and is going to have surgery. She is anxious about husband's surgery and possible prognosis.  She reports high anxiety in response to severe stressors. She reports feeling "agitated" at times in response to stressors. She notices some increased sensitivity to sound with increased stress. Appetite has been decreased. She reports that she has lost weight since starting Ozempic. She reports that her energy and motivation have been ok. She has been trying to walk regularly. Denies SI.   She reports that Risperdal was helpful for her mood and anxiety prior to recent stressors.   Past Psychiatric Medication Trials: Remeron- Had worsening depression and insomnia with 30 mg dose. Olanzapine Seroquel  Diazepam Gabapentin Trazodone Trileptal- hyponatremia Ambien   Review of Systems:  Review of Systems  HENT: Positive for voice change.   Respiratory: Positive for shortness of breath.   Gastrointestinal: Negative for abdominal pain.  Musculoskeletal: Negative for gait problem.  Neurological: Positive for headaches. Negative for tremors.       Neuropathy  Psychiatric/Behavioral:       Please refer to HPI    Medications: I have reviewed the patient's current medications.  Current Outpatient Medications  Medication Sig Dispense Refill  . acetaminophen (TYLENOL) 500 MG tablet Take 1,000 mg by mouth every 6 (six) hours as needed for moderate pain.    Marland Kitchen albuterol (VENTOLIN HFA) 108 (90 Base) MCG/ACT inhaler Inhale 2 puffs into the lungs every 4 (four) hours as needed for  wheezing or shortness of breath. 8.8 g 3  . alendronate (FOSAMAX) 70 MG tablet TAKE 1 TABLET EVERY WEEK WITH FULL GLASS OF WATER ON EMPTY STOMACH (Patient taking differently: Take 70 mg by mouth every Thursday. ) 12 tablet 3  . aspirin EC 81 MG tablet Take 1 tablet (81 mg total) by  mouth daily. 150 tablet 2  . atorvastatin (LIPITOR) 40 MG tablet TAKE 1 TABLET BY MOUTH EVERYDAY AT BEDTIME (Patient taking differently: Take 40 mg by mouth at bedtime. ) 90 tablet 1  . budesonide-formoterol (SYMBICORT) 80-4.5 MCG/ACT inhaler TAKE 2 PUFFS BY MOUTH TWICE A DAY 3 each 0  . Calcium Carbonate-Vitamin D (CALTRATE 600+D) 600-400 MG-UNIT tablet Take 1 tablet by mouth 2 (two) times daily. 60 tablet 11  . clopidogrel (PLAVIX) 75 MG tablet Take 1 tablet (75 mg total) by mouth daily. 30 tablet 11  . docusate sodium (COLACE) 100 MG capsule Take 100-200 mg by mouth at bedtime.    Marland Kitchen esomeprazole (NEXIUM) 40 MG capsule Take 1 capsule (40 mg total) by mouth daily. 30 capsule 3  . gabapentin (NEURONTIN) 400 MG capsule TAKE 1 CAPSULE TWICE A DAY AND TAKE 4 CAPSULES AT BEDTIME 540 capsule 1  . levocetirizine (XYZAL) 5 MG tablet TAKE 1 TABLET BY MOUTH EVERY DAY IN THE EVENING (Patient taking differently: Take 5 mg by mouth every evening. ) 90 tablet 1  . levothyroxine (SYNTHROID) 112 MCG tablet TAKE 1 TABLET BY MOUTH EVERY DAY (Patient taking differently: Take 112 mcg by mouth daily. ) 90 tablet 1  . LINZESS 290 MCG CAPS capsule TAKE 1 CAPSULE EVERY DAY ON EMPTY STOMACH AT LEAST 30 MINUTES PRIOR TO 1ST MEAL OF THE DAY (Patient taking differently: Take 290 mcg by mouth daily before breakfast. ) 90 capsule 1  . lisinopril (ZESTRIL) 40 MG tablet Take 1 tablet (40 mg total) by mouth daily. 90 tablet 1  . mirtazapine (REMERON) 45 MG tablet Take 1 tablet (45 mg total) by mouth at bedtime. 90 tablet 1  . MYRBETRIQ 50 MG TB24 tablet TAKE 1 TABLET BY MOUTH EVERY DAY (Patient taking differently: Take 50 mg by mouth daily. ) 90 tablet 1  . OZEMPIC, 0.25 OR 0.5 MG/DOSE, 2 MG/1.5ML SOPN INJECT 0.1875 MLS (0.25 MG TOTAL) INTO THE SKIN ONCE A WEEK FOR 28 DAYS, THEN 0.375 MLS (0.5 MG TOTAL) ONCE A WEEK FOR 28 DAYS. 1.5 mL 1  . Probiotic Product (PROBIOTIC DAILY PO) Take 2 tablets by mouth daily.    . risperiDONE  (RISPERDAL) 1 MG tablet Take 1 tablet (1 mg total) by mouth at bedtime. 30 tablet 5  . [START ON 05/07/2020] diazepam (VALIUM) 5 MG tablet Take 1 tablet in the am, 1/2 tablet at lunch and 1 tablet at night 75 tablet 5  . montelukast (SINGULAIR) 10 MG tablet Take 1 tablet (10 mg total) by mouth at bedtime. 90 tablet 2   No current facility-administered medications for this visit.    Medication Side Effects: None  Allergies: No Known Allergies  Past Medical History:  Diagnosis Date  . Anxiety   . Benzodiazepine dependence (HCC)   . Colonic polyp    mulitple polyps. repeat in 05/2019  . Constipation    IBS  . COPD (chronic obstructive pulmonary disease) (HCC)   . Depression   . GERD (gastroesophageal reflux disease)   . Hyperlipidemia   . Hypertension   . Insomnia   . Migraine headache   . OAB (overactive bladder)   . Osteoarthritis   .  Osteoporosis 12/19/2017  . Post-surgical hypothyroidism   . Pulmonary embolism (HCC)    Eliquis to stop 05/03/19  . Scoliosis     Family History  Problem Relation Age of Onset  . Breast cancer Mother   . Arthritis Mother   . Hearing loss Mother   . Hyperlipidemia Mother   . Hypertension Mother   . Anxiety disorder Mother   . Lung cancer Maternal Uncle   . Anxiety disorder Maternal Uncle   . Lung cancer Maternal Grandmother   . Heart attack Maternal Grandmother   . Anxiety disorder Maternal Grandmother   . Asthma Father   . COPD Father   . Hyperlipidemia Father   . Hypertension Father   . Alcohol abuse Father   . Anxiety disorder Sister   . Hypertension Sister   . Thyroid cancer Sister   . Anxiety disorder Brother   . Hypertension Brother   . Drug abuse Son   . Anxiety disorder Sister   . Hypertension Sister   . Anxiety disorder Sister   . Hypertension Sister   . Hyperlipidemia Son     Social History   Socioeconomic History  . Marital status: Married    Spouse name: Not on file  . Number of children: Not on file  .  Years of education: Not on file  . Highest education level: Not on file  Occupational History  . Occupation: disabled due "my nerves"  Tobacco Use  . Smoking status: Current Every Day Smoker    Packs/day: 1.00    Years: 44.00    Pack years: 44.00    Types: Cigarettes  . Smokeless tobacco: Never Used  Vaping Use  . Vaping Use: Former  Substance and Sexual Activity  . Alcohol use: No  . Drug use: No  . Sexual activity: Not on file  Other Topics Concern  . Not on file  Social History Narrative  . Not on file   Social Determinants of Health   Financial Resource Strain: Low Risk   . Difficulty of Paying Living Expenses: Not hard at all  Food Insecurity: No Food Insecurity  . Worried About Programme researcher, broadcasting/film/video in the Last Year: Never true  . Ran Out of Food in the Last Year: Never true  Transportation Needs: No Transportation Needs  . Lack of Transportation (Medical): No  . Lack of Transportation (Non-Medical): No  Physical Activity:   . Days of Exercise per Week: Not on file  . Minutes of Exercise per Session: Not on file  Stress:   . Feeling of Stress : Not on file  Social Connections:   . Frequency of Communication with Friends and Family: Not on file  . Frequency of Social Gatherings with Friends and Family: Not on file  . Attends Religious Services: Not on file  . Active Member of Clubs or Organizations: Not on file  . Attends Banker Meetings: Not on file  . Marital Status: Not on file  Intimate Partner Violence:   . Fear of Current or Ex-Partner: Not on file  . Emotionally Abused: Not on file  . Physically Abused: Not on file  . Sexually Abused: Not on file    Past Medical History, Surgical history, Social history, and Family history were reviewed and updated as appropriate.   Please see review of systems for further details on the patient's review from today.   Objective:   Physical Exam:  There were no vitals taken for this visit.  Physical  Exam Neurological:     Mental Status: She is alert and oriented to person, place, and time.     Cranial Nerves: No dysarthria.  Psychiatric:        Attention and Perception: Attention and perception normal.        Mood and Affect: Mood is anxious.        Speech: Speech normal.        Behavior: Behavior is cooperative.        Thought Content: Thought content normal. Thought content is not paranoid or delusional. Thought content does not include homicidal or suicidal ideation. Thought content does not include homicidal or suicidal plan.        Cognition and Memory: Cognition and memory normal.        Judgment: Judgment normal.     Comments: Insight intact Sad in response to recent losses     Lab Review:     Component Value Date/Time   NA 137 03/24/2020 0921   NA 140 10/02/2017 1700   K 4.4 03/24/2020 0921   CL 104 03/24/2020 0921   CO2 26 10/09/2019 0924   GLUCOSE 97 03/24/2020 0921   BUN 23 03/24/2020 0921   BUN 11 10/02/2017 1700   CREATININE 0.80 03/24/2020 0921   CREATININE 0.98 05/18/2017 1426   CALCIUM 10.4 10/09/2019 0924   PROT 6.9 05/04/2018 1129   ALBUMIN 4.5 05/04/2018 1129   AST 15 05/04/2018 1129   ALT 9 05/04/2018 1129   ALKPHOS 64 05/04/2018 1129   BILITOT 0.2 05/04/2018 1129   GFRNONAA 56 (L) 04/13/2019 0915   GFRNONAA 88 05/10/2017 1023   GFRAA >60 04/13/2019 0915   GFRAA 102 05/10/2017 1023       Component Value Date/Time   WBC 6.5 04/13/2019 0915   RBC 3.83 (L) 04/13/2019 0915   HGB 11.2 (L) 03/24/2020 0921   HGB 11.6 08/28/2017 1142   HCT 33.0 (L) 03/24/2020 0921   HCT 33.2 (L) 08/28/2017 1142   PLT 93 (L) 04/13/2019 0915   PLT 222 08/28/2017 1142   MCV 103.4 (H) 04/13/2019 0915   MCV 88 08/28/2017 1142   MCH 32.4 04/13/2019 0915   MCHC 31.3 04/13/2019 0915   RDW 13.5 04/13/2019 0915   RDW 13.4 08/28/2017 1142   LYMPHSABS 1.7 04/13/2019 0915   LYMPHSABS 1.7 08/28/2017 1142   MONOABS 0.4 04/13/2019 0915   EOSABS 0.5 04/13/2019 0915    EOSABS 0.1 08/28/2017 1142   BASOSABS 0.0 04/13/2019 0915   BASOSABS 0.0 08/28/2017 1142    No results found for: POCLITH, LITHIUM   No results found for: PHENYTOIN, PHENOBARB, VALPROATE, CBMZ   .res Assessment: Plan:   Discussed potential benefits, risks, and side effects of increasing risperidone to 1 mg at bedtime for mood stabilization since patient reports that this has been helpful for her agitation, anxiety, and insomnia. She agrees to increase in dose of Risperdal. Discussed continuing to monitor for any signs of involuntary movements.  Continue all other medications as prescribed. Pt to follow-up in 5 months or sooner if clinically indicated. Patient advised to contact office with any questions, adverse effects, or acute worsening in signs and symptoms.  Sharah was seen today for anxiety, other and sleeping problem.  Diagnoses and all orders for this visit:  Primary insomnia -     diazepam (VALIUM) 5 MG tablet; Take 1 tablet in the am, 1/2 tablet at lunch and 1 tablet at night -     gabapentin (NEURONTIN) 400 MG capsule;  TAKE 1 CAPSULE TWICE A DAY AND TAKE 4 CAPSULES AT BEDTIME -     mirtazapine (REMERON) 45 MG tablet; Take 1 tablet (45 mg total) by mouth at bedtime.  Generalized anxiety disorder -     diazepam (VALIUM) 5 MG tablet; Take 1 tablet in the am, 1/2 tablet at lunch and 1 tablet at night -     gabapentin (NEURONTIN) 400 MG capsule; TAKE 1 CAPSULE TWICE A DAY AND TAKE 4 CAPSULES AT BEDTIME -     mirtazapine (REMERON) 45 MG tablet; Take 1 tablet (45 mg total) by mouth at bedtime.  Episodic mood disorder (HCC) -     risperiDONE (RISPERDAL) 1 MG tablet; Take 1 tablet (1 mg total) by mouth at bedtime. -     mirtazapine (REMERON) 45 MG tablet; Take 1 tablet (45 mg total) by mouth at bedtime.    Please see After Visit Summary for patient specific instructions.  Future Appointments  Date Time Provider Department Center  05/04/2020  8:00 AM [facility] - [facility]  05/04/2020  9:00 AM [facility]  05/04/2020  9:20 AM Nada LibmanBrabham, Vance W, MD [facility]  05/11/2020 11:00 AM Carmelia RollerWendling, Jilda RocheNicholas Paul, DO [facility]    No orders of the defined types were placed in this encounter.     -------------------------------

## 2020-04-22 NOTE — Telephone Encounter (Signed)
Ms. zairah, arista are scheduled for a virtual visit with your provider today.    Just as we do with appointments in the office, we must obtain your consent to participate.  Your consent will be active for this visit and any virtual visit you may have with one of our providers in the next 365 days.    If you have a MyChart account, I can also send a copy of this consent to you electronically.  All virtual visits are billed to your insurance company just like a traditional visit in the office.  As this is a virtual visit, video technology does not allow for your provider to perform a traditional examination.  This may limit your provider's ability to fully assess your condition.  If your provider identifies any concerns that need to be evaluated in person or the need to arrange testing such as labs, EKG, etc, we will make arrangements to do so.    Although advances in technology are sophisticated, we cannot ensure that it will always work on either your end or our end.  If the connection with a video visit is poor, we may have to switch to a telephone visit.  With either a video or telephone visit, we are not always able to ensure that we have a secure connection.   I need to obtain your verbal consent now.   Are you willing to proceed with your visit today?   EMILINE MANCEBO has provided verbal consent on 04/22/2020 for a virtual visit (video or telephone).   Corie Chiquito, PMHNP 04/22/2020  8:27 AM

## 2020-05-04 ENCOUNTER — Ambulatory Visit (INDEPENDENT_AMBULATORY_CARE_PROVIDER_SITE_OTHER): Payer: PPO | Admitting: Surgery

## 2020-05-04 ENCOUNTER — Ambulatory Visit (HOSPITAL_COMMUNITY)
Admission: RE | Admit: 2020-05-04 | Discharge: 2020-05-04 | Disposition: A | Discharge status: Home / Self Care | Payer: PPO | Source: Ambulatory Visit | Attending: Surgery | Admitting: Surgery

## 2020-05-04 ENCOUNTER — Ambulatory Visit (INDEPENDENT_AMBULATORY_CARE_PROVIDER_SITE_OTHER)
Admission: RE | Admit: 2020-05-04 | Discharge: 2020-05-04 | Disposition: A | Discharge status: Home / Self Care | Payer: PPO | Source: Ambulatory Visit | Attending: Surgery | Admitting: Surgery

## 2020-05-04 ENCOUNTER — Other Ambulatory Visit: Payer: Self-pay

## 2020-05-04 ENCOUNTER — Encounter: Payer: Self-pay | Admitting: Surgery

## 2020-05-04 VITALS — BP 95/62 | HR 69 | Temp 97.3°F | Resp 14 | Ht 59.0 in | Wt 143.0 lb

## 2020-05-04 DIAGNOSIS — I771 Stricture of artery: Secondary | ICD-10-CM

## 2020-05-04 DIAGNOSIS — I70213 Atherosclerosis of native arteries of extremities with intermittent claudication, bilateral legs: Secondary | ICD-10-CM | POA: Diagnosis not present

## 2020-05-04 NOTE — Progress Notes (Signed)
[facility]  Patient name: Brenda Dunn MRN: [id number] DOB: 10/28/1950 Sex: female   REASON FOR VISIT:    Follow up  [place]:    Brenda Dunn is a 69 y.o. female who I met in March 02, 2020 for evaluation of bilateral leg pain that occurred with less than 50 feet of walking.  She did not have ulcers or rest pain.  She went for angiography on 03/24/2020 and was found to have bilateral greater than 60% common iliac stenosis that was successfully treated using balloon expandable 8 mm stents.  She did not have any other significant outflow or runoff disease.  She is back today for follow-up.  She states that she is now walking 2.6 miles.  She will get some numbness along the right lateral ankle.  The patient is a current smoker.  She takes a statin for hypercholesterolemia.  She has a history of PE.  She is not on anticoagulation. PAST MEDICAL HISTORY:   Past Medical History:  Diagnosis Date  . Anxiety   . Benzodiazepine dependence ([place])   . Colonic polyp    mulitple polyps. repeat in 05/2019  . Constipation    IBS  . COPD (chronic obstructive pulmonary disease) ([place])   . Depression   . GERD (gastroesophageal reflux disease)   . Hyperlipidemia   . Hypertension   . Insomnia   . Migraine headache   . OAB (overactive bladder)   . Osteoarthritis   . Osteoporosis 12/19/2017  . Post-surgical hypothyroidism   . Pulmonary embolism ([place])    Eliquis to stop 05/03/19  . Scoliosis      FAMILY HISTORY:   Family History  Problem Relation Age of Onset  . Breast cancer Mother   . Arthritis Mother   . Hearing loss Mother   . Hyperlipidemia Mother   . Hypertension Mother   . Anxiety disorder Mother   . Lung cancer Maternal Uncle   . Anxiety disorder Maternal Uncle   . Lung cancer Maternal Grandmother   . Heart attack Maternal Grandmother   . Anxiety disorder Maternal Grandmother   . Asthma Father     . COPD Father   . Hyperlipidemia Father   . Hypertension Father   . Alcohol abuse Father   . Anxiety disorder Sister   . Hypertension Sister   . Thyroid cancer Sister   . Anxiety disorder Brother   . Hypertension Brother   . Drug abuse Son   . Anxiety disorder Sister   . Hypertension Sister   . Anxiety disorder Sister   . Hypertension Sister   . Hyperlipidemia Son     SOCIAL HISTORY:   Social History   Tobacco Use  . Smoking status: Current Every Day Smoker    Packs/day: 1.00    Years: 44.00    Pack years: 44.00    Types: Cigarettes  . Smokeless tobacco: Never Used  Substance Use Topics  . Alcohol use: No     ALLERGIES:   No Known Allergies   CURRENT MEDICATIONS:   Current Outpatient Medications  Medication Sig Dispense Refill  . acetaminophen (TYLENOL) 500 MG tablet Take 1,000 mg by mouth every 6 (six) hours as needed for moderate pain.    Marland Kitchen albuterol (VENTOLIN HFA) 108 (90 Base) MCG/ACT inhaler Inhale 2 puffs into the lungs every 4 (four) hours as needed for wheezing or shortness of breath. 8.8 g 3  . alendronate (FOSAMAX) 70 MG tablet TAKE  1 TABLET EVERY WEEK WITH FULL GLASS OF WATER ON EMPTY STOMACH (Patient taking differently: Take 70 mg by mouth every Thursday. ) 12 tablet 3  . aspirin EC 81 MG tablet Take 1 tablet (81 mg total) by mouth daily. 150 tablet 2  . atorvastatin (LIPITOR) 40 MG tablet TAKE 1 TABLET BY MOUTH EVERYDAY AT BEDTIME (Patient taking differently: Take 40 mg by mouth at bedtime. ) 90 tablet 1  . budesonide-formoterol (SYMBICORT) 80-4.5 MCG/ACT inhaler TAKE 2 PUFFS BY MOUTH TWICE A DAY 3 each 0  . Calcium Carbonate-Vitamin D (CALTRATE 600+D) 600-400 MG-UNIT tablet Take 1 tablet by mouth 2 (two) times daily. 60 tablet 11  . clopidogrel (PLAVIX) 75 MG tablet Take 1 tablet (75 mg total) by mouth daily. 30 tablet 11  . [START ON 05/07/2020] diazepam (VALIUM) 5 MG tablet Take 1 tablet in the am, 1/2 tablet at lunch and 1 tablet at night 75 tablet  5  . docusate sodium (COLACE) 100 MG capsule Take 100-200 mg by mouth at bedtime.    Marland Kitchen esomeprazole (NEXIUM) 40 MG capsule Take 1 capsule (40 mg total) by mouth daily. 30 capsule 3  . gabapentin (NEURONTIN) 400 MG capsule TAKE 1 CAPSULE TWICE A DAY AND TAKE 4 CAPSULES AT BEDTIME 540 capsule 1  . levocetirizine (XYZAL) 5 MG tablet TAKE 1 TABLET BY MOUTH EVERY DAY IN THE EVENING (Patient taking differently: Take 5 mg by mouth every evening. ) 90 tablet 1  . levothyroxine (SYNTHROID) 112 MCG tablet TAKE 1 TABLET BY MOUTH EVERY DAY (Patient taking differently: Take 112 mcg by mouth daily. ) 90 tablet 1  . LINZESS 290 MCG CAPS capsule TAKE 1 CAPSULE EVERY DAY ON EMPTY STOMACH AT LEAST 30 MINUTES PRIOR TO 1ST MEAL OF THE DAY (Patient taking differently: Take 290 mcg by mouth daily before breakfast. ) 90 capsule 1  . lisinopril (ZESTRIL) 40 MG tablet Take 1 tablet (40 mg total) by mouth daily. 90 tablet 1  . mirtazapine (REMERON) 45 MG tablet Take 1 tablet (45 mg total) by mouth at bedtime. 90 tablet 1  . montelukast (SINGULAIR) 10 MG tablet Take 1 tablet (10 mg total) by mouth at bedtime. 90 tablet 2  . MYRBETRIQ 50 MG TB24 tablet TAKE 1 TABLET BY MOUTH EVERY DAY (Patient taking differently: Take 50 mg by mouth daily. ) 90 tablet 1  . OZEMPIC, 0.25 OR 0.5 MG/DOSE, 2 MG/1.5ML SOPN INJECT 0.1875 MLS (0.25 MG TOTAL) INTO THE SKIN ONCE A WEEK FOR 28 DAYS, THEN 0.375 MLS (0.5 MG TOTAL) ONCE A WEEK FOR 28 DAYS. 1.5 mL 1  . Probiotic Product (PROBIOTIC DAILY PO) Take 2 tablets by mouth daily.    . risperiDONE (RISPERDAL) 1 MG tablet Take 1 tablet (1 mg total) by mouth at bedtime. 30 tablet 5   No current facility-administered medications for this visit.    REVIEW OF SYSTEMS:   '[X]'$  denotes positive finding, $RemoveBeforeDEI'[ ]'[id number]$  denotes negative finding Cardiac  Comments:  Chest pain or chest pressure:    Shortness of breath upon exertion:    Short of breath when lying flat:    Irregular heart rhythm:        Vascular     Pain in calf, thigh, or hip brought on by ambulation:    Pain in feet at night that wakes you up from your sleep:     Blood clot in your veins:    Leg swelling:         Pulmonary  Oxygen at home:    Productive cough:     Wheezing:         Neurologic    Sudden weakness in arms or legs:     Sudden numbness in arms or legs:     Sudden onset of difficulty speaking or slurred speech:    Temporary loss of vision in one eye:     Problems with dizziness:         Gastrointestinal    Blood in stool:     Vomited blood:         Genitourinary    Burning when urinating:     Blood in urine:        Psychiatric    Major depression:         Hematologic    Bleeding problems:    Problems with blood clotting too easily:        Skin    Rashes or ulcers:        Constitutional    Fever or chills:      PHYSICAL EXAM:   Vitals:   05/04/20 0919  BP: 95/62  Pulse: 69  Resp: 14  Temp: (!) 97.3 F (36.3 C)  TempSrc: Temporal  SpO2: 96%  Weight: 143 lb (64.9 kg)  Height: $Remove'4\' 11"'rXUlRYw$  (1.499 m)    GENERAL: The patient is a well-nourished female, in no acute distress. The vital signs are documented above. CARDIAC: There is a regular rate and rhythm.  VASCULAR: Palpable dorsalis pedis pulse bilaterally PULMONARY: Non-labored respirations MUSCULOSKELETAL: There are no major deformities or cyanosis. NEUROLOGIC: No focal weakness or paresthesias are detected. SKIN: There are no ulcers or rashes noted. PSYCHIATRIC: The patient has a normal affect.  STUDIES:   I have ordered and reviewed the following vascular studies +-------+-----------+-----------+------------+------------+  ABI/TBIToday's ABIToday's TBIPrevious ABIPrevious TBI  +-------+-----------+-----------+------------+------------+  Right 1.2    0.76    0.95    0.7       +-------+-----------+-----------+------------+------------+  Left  1.06    0.57    1.08    0.71       +-------+-----------+-----------+------------+------------+  Patent bilateral CIA stents with no stenosis noted.  MEDICAL ISSUES:   Status post bilateral common iliac stents for severe claudication.  Her stents are widely patent by ultrasound today.  She has palpable pedal pulses.  Her claudication symptoms have resolved.  I will stop her Plavix after 1 month and she will continue taking a baby aspirin and a statin.  She will follow-up in 6 months with repeat duplex.    Leia Alf, MD, FACS [facility] [phone] Pager [phone]

## 2020-05-07 ENCOUNTER — Other Ambulatory Visit: Payer: Self-pay | Admitting: *Deleted

## 2020-05-07 DIAGNOSIS — I771 Stricture of artery: Secondary | ICD-10-CM

## 2020-05-07 DIAGNOSIS — I70213 Atherosclerosis of native arteries of extremities with intermittent claudication, bilateral legs: Secondary | ICD-10-CM

## 2020-05-10 ENCOUNTER — Other Ambulatory Visit: Payer: Self-pay | Admitting: Family Medicine

## 2020-05-10 DIAGNOSIS — K219 Gastro-esophageal reflux disease without esophagitis: Secondary | ICD-10-CM

## 2020-05-11 ENCOUNTER — Ambulatory Visit: Payer: PPO | Admitting: Family Medicine

## 2020-05-11 ENCOUNTER — Other Ambulatory Visit: Payer: Self-pay | Admitting: Family Medicine

## 2020-05-15 ENCOUNTER — Other Ambulatory Visit: Payer: Self-pay | Admitting: Psychiatry

## 2020-05-15 DIAGNOSIS — F39 Unspecified mood [affective] disorder: Secondary | ICD-10-CM

## 2020-05-15 NOTE — Telephone Encounter (Signed)
review 

## 2020-05-20 ENCOUNTER — Encounter: Payer: Self-pay | Admitting: Family Medicine

## 2020-05-20 ENCOUNTER — Telehealth: Payer: PPO | Admitting: Psychiatry

## 2020-05-20 ENCOUNTER — Other Ambulatory Visit: Payer: Self-pay

## 2020-05-20 ENCOUNTER — Ambulatory Visit (INDEPENDENT_AMBULATORY_CARE_PROVIDER_SITE_OTHER): Payer: PPO | Admitting: Family Medicine

## 2020-05-20 VITALS — BP 128/80 | HR 88 | Temp 98.0°F | Ht 59.0 in | Wt 146.0 lb

## 2020-05-20 DIAGNOSIS — E669 Obesity, unspecified: Secondary | ICD-10-CM

## 2020-05-20 DIAGNOSIS — S76012A Strain of muscle, fascia and tendon of left hip, initial encounter: Secondary | ICD-10-CM | POA: Diagnosis not present

## 2020-05-20 DIAGNOSIS — Z23 Encounter for immunization: Secondary | ICD-10-CM | POA: Diagnosis not present

## 2020-05-20 MED ORDER — ALBUTEROL SULFATE HFA 108 (90 BASE) MCG/ACT IN AERS: 2.0000 | INHALATION_SPRAY | RESPIRATORY_TRACT | 3 refills | Status: DC | PRN

## 2020-05-20 MED ORDER — CYCLOBENZAPRINE HCL 10 MG PO TABS: 5.0000 mg | ORAL_TABLET | Freq: Three times a day (TID) | ORAL | 0 refills | Status: DC | PRN

## 2020-05-20 MED ORDER — OZEMPIC (1 MG/DOSE) 2 MG/1.5ML ~~LOC~~ SOPN: 1.0000 mg | PEN_INJECTOR | SUBCUTANEOUS | 5 refills | Status: DC

## 2020-05-20 NOTE — Progress Notes (Signed)
Chief Complaint  Patient presents with  . Follow-up    weight loss    Subjective: Patient is a 69 y.o. female here for f/u wt loss.  She was started on Ozempic 0.5 mg weekly back in July. She is down around 5 lbs. She did have peripheral vascular catheterization on 8/24 for LE claudication. She is able to walk much further. Her back will sometimes hurt.   She tweaked her L thigh getting out of the car 4 d ago. No redness, bruising or swelling. Hurts when she moves or abducts her thigh. No catching or locking. No bony ttp.    Past Medical History:  Diagnosis Date  . Anxiety   . Benzodiazepine dependence (HCC)   . Colonic polyp    mulitple polyps. repeat in 05/2019  . Constipation    IBS  . COPD (chronic obstructive pulmonary disease) (HCC)   . Depression   . GERD (gastroesophageal reflux disease)   . Hyperlipidemia   . Hypertension   . Insomnia   . Migraine headache   . OAB (overactive bladder)   . Osteoarthritis   . Osteoporosis 12/19/2017  . Post-surgical hypothyroidism   . Pulmonary embolism (HCC)    Eliquis to stop 05/03/19  . Scoliosis     Objective: BP 128/80 (BP Location: Left Arm, Patient Position: Sitting, Cuff Size: Normal)   Pulse 88   Temp 98 F (36.7 C) (Oral)   Ht 4\' 11"  (1.499 m)   Wt 146 lb (66.2 kg)   SpO2 96%   BMI 29.49 kg/m  General: Awake, appears stated age MSK: +TTP over hip flexor on L, +Stinchfield, neg log roll, FADDIR, FABER; no bony ttp Neuro: Antalgic gait Heart: RRR Lungs: CTAB, no rales, wheezes or rhonchi. No accessory muscle use Psych: Age appropriate judgment and insight, normal affect and mood  Assessment and Plan: Obesity (BMI 30-39.9) - Plan: Semaglutide, 1 MG/DOSE, (OZEMPIC, 1 MG/DOSE,) 2 MG/1.5ML SOPN  Strain of flexor muscle of left hip, initial encounter - Plan: cyclobenzaprine (FLEXERIL) 10 MG tablet  Need for influenza vaccination - Plan: Flu Vaccine QUAD High Dose(Fluad)  1. Increase Ozempic to 1 mg weekly.  Counseled on diet/exercise. 2. Heat, ice, Tylenol, stretches/exercises. Flexeril. She has been on this before and does well. Warnings about drowsiness verbalized and written down.  F/u in 6 mo for CPE or prn.  The patient voiced understanding and agreement to the plan.  [place], DO 05/20/20  12:05 PM

## 2020-05-20 NOTE — Patient Instructions (Addendum)
Heat (pad or rice pillow in microwave) over affected area, 10-15 minutes twice daily.   Ice/cold pack over area for 10-15 min twice daily.  OK to take Tylenol 1000 mg (2 extra strength tabs) or 975 mg (3 regular strength tabs) every 6 hours as needed.  Take Flexeril (cyclobenzaprine) 1-2 hours before planned bedtime. If it makes you drowsy, do not take during the day. You can try half a tab the following night.  Look into yoga.   Let us know if you need anything.   Hip Exercises It is normal to feel mild stretching, pulling, tightness, or discomfort as you do these exercises, but you should stop right away if you feel sudden pain or your pain gets worse.  STRETCHING AND RANGE OF MOTION EXERCISES These exercises warm up your muscles and joints and improve the movement and flexibility of your hip. These exercises also help to relieve pain, numbness, and tingling. Exercise A: Hamstrings, Supine  1. Lie on your back. 2. Loop a belt or towel over the ball of your left / rightfoot. The ball of your foot is on the walking surface, right under your toes. 3. Straighten your left / rightknee and slowly pull on the belt to raise your leg. ? Do not let your left / right knee bend while you do this. ? Keep your other leg flat on the floor. ? Raise the left / right leg until you feel a gentle stretch behind your left / right knee or thigh. 4. Hold this position for 30 seconds. 5. Slowly return your leg to the starting position. Repeat2 times. Complete this stretch 3 times per week. Exercise B: Hip Rotators  1. Lie on your back on a firm surface. 2. Hold your left / right knee with your left / right hand. Hold your ankle with your other hand. 3. Gently pull your left / right knee and rotate your lower leg toward your other shoulder. ? Pull until you feel a stretch in your buttocks. ? Keep your hips and shoulders firmly planted while you do this stretch. 4. Hold this position for 30  seconds. Repeat 2 times. Complete this stretch 3 times per week. Exercise C: V-Sit (Hamstrings and Adductors)  1. Sit on the floor with your legs extended in a large "V" shape. Keep your knees straight during this exercise. 2. Start with your head and chest upright, then bend at your waist to reach for your left foot (position A). You should feel a stretch in your right inner thigh. 3. Hold this position for 30 seconds. Then slowly return to the upright position. 4. Bend at your waist to reach forward (position B). You should feel a stretch behind both of your thighs and knees. 5. Hold this position for 30 seconds. Then slowly return to the upright position. 6. Bend at your waist to reach for your right foot (position C). You should feel a stretch in your left inner thigh. 7. Hold this position for 30 seconds. Then slowly return to the upright position. Repeat A, B, and C 2 times each. Complete this stretch 3 times per week. Exercise D: Lunge (Hip Flexors)  1. Place your left / right knee on the floor and bend your other knee so that is directly over your ankle. You should be half-kneeling. 2. Keep good posture with your head over your shoulders. 3. Tighten your buttocks to point your tailbone downward. This helps your back to keep from arching too much. 4. You should  feel a gentle stretch in the front of your left / right thigh and hip. If you do not feel any resistance, slightly slide your other foot forward and then slowly lunge forward so your knee once again lines up over your ankle. 5. Make sure your tailbone continues to point downward. 6. Hold this position for 30 seconds. Repeat 2 times. Complete this stretch 3 times per week.  STRENGTHENING EXERCISES These exercises build strength and endurance in your hip. Endurance is the ability to use your muscles for a long time, even after they get tired. Exercise E: Bridge (Hip Extensors)  1. Lie on your back on a firm surface with your  knees bent and your feet flat on the floor. 2. Tighten your buttocks muscles and lift your bottom off the floor until the trunk of your body is level with your thighs. ? Do not arch your back. ? You should feel the muscles working in your buttocks and the back of your thighs. If you do not feel these muscles, slide your feet 1-2 inches (2.5-5 cm) farther away from your buttocks. 3. Hold this position for 3 seconds. 4. Slowly lower your hips to the starting position. Repeat for a total of 10 repetitions. 5. Let your muscles relax completely between repetitions. 6. If this exercise is too easy, try doing it with your arms crossed over your chest. Repeat 2 times. Complete this exercise 3 times per week. Exercise F: Straight Leg Raises - Hip Abductors  1. Lie on your side with your left / right leg in the top position. Lie so your head, shoulder, knee, and hip line up with each other. You may bend your bottom knee to help you balance. 2. Roll your hips slightly forward, so your hips are stacked directly over each other and your left / right knee is facing forward. 3. Leading with your heel, lift your top leg 4-6 inches (10-15 cm). You should feel the muscles in your outer hip lifting. ? Do not let your foot drift forward. ? Do not let your knee roll toward the ceiling. 4. Hold this position for 1 second. 5. Slowly return to the starting position. 6. Let your muscles relax completely between repetitions. Repeat for a total of 10 repetitions.  Repeat 2 times. Complete this exercise 3 times per week. Exercise G: Straight Leg Raises - Hip Adductors  1. Lie on your side with your left / right leg in the bottom position. Lie so your head, shoulder, knee, and hip line up. You may place your upper foot in front to help you balance. 2. Roll your hips slightly forward, so your hips are stacked directly over each other and your left / right knee is facing forward. 3. Tense the muscles in your inner thigh  and lift your bottom leg 4-6 inches (10-15 cm). 4. Hold this position for 1 second. 5. Slowly return to the starting position. 6. Let your muscles relax completely between repetitions. Repeat for a total of 10 repetitions. Repeat 2 times. Complete this exercise 3 times per week. Exercise H: Straight Leg Raises - Quadriceps  1. Lie on your back with your left / right leg extended and your other knee bent. 2. Tense the muscles in the front of your left / right thigh. When you do this, you should see your kneecap slide up or see increased dimpling just above your knee. 3. Tighten these muscles even more and raise your leg 4-6 inches (10-15 cm) off the floor.  4. Hold this position for 3 seconds. 5. Keep these muscles tense as you lower your leg. 6. Relax the muscles slowly and completely between repetitions. Repeat for a total of 10 repetitions. Repeat 2 times. Complete this exercise 3 times per week. Exercise I: Hip Abductors, Standing 1. Tie one end of a rubber exercise band or tubing to a secure surface, such as a table or pole. 2. Loop the other end of the band or tubing around your left / right ankle. 3. Keeping your ankle with the band or tubing directly opposite of the secured end, step away until there is tension in the tubing or band. Hold onto a chair as needed for balance. 4. Lift your left / right leg out to your side. While you do this: ? Keep your back upright. ? Keep your shoulders over your hips. ? Keep your toes pointing forward. ? Make sure to use your hip muscles to lift your leg. Do not "throw" your leg or tip your body to lift your leg. 5. Hold this position for 1 second. 6. Slowly return to the starting position. Repeat for a total of 10 repetitions. Repeat 2 times. Complete this exercise 3 times per week. Exercise J: Squats (Quadriceps) 1. Stand in a door frame so your feet and knees are in line with the frame. You may place your hands on the frame for  balance. 2. Slowly bend your knees and lower your hips like you are going to sit in a chair. ? Keep your lower legs in a straight-up-and-down position. ? Do not let your hips go lower than your knees. ? Do not bend your knees lower than told by your health care provider. ? If your hip pain increases, do not bend as low. 3. Hold this position for 1 second. 4. Slowly push with your legs to return to standing. Do not use your hands to pull yourself to standing. Repeat for a total of 10 repetitions. Repeat 2 times. Complete this exercise 3 times per week. Make sure you discuss any questions you have with your health care provider. Document Released: 08/05/2005 Document Revised: 04/11/2016 Document Reviewed: 07/13/2015 Elsevier Interactive Patient Education  [employer].

## 2020-05-26 ENCOUNTER — Other Ambulatory Visit: Payer: Self-pay | Admitting: Family Medicine

## 2020-05-26 DIAGNOSIS — S76012A Strain of muscle, fascia and tendon of left hip, initial encounter: Secondary | ICD-10-CM

## 2020-06-04 ENCOUNTER — Telehealth: Payer: Self-pay | Admitting: Family Medicine

## 2020-06-04 NOTE — Telephone Encounter (Signed)
Patient called and was wondering if Dr. Carmelia Roller could send something in for her pain for her groin area and goes across her legs. It can be sent to [id number] - [place], [place] - [address]

## 2020-06-04 NOTE — Telephone Encounter (Signed)
Please advise 

## 2020-06-05 NOTE — Telephone Encounter (Signed)
Try some Tylenol and heat, if no better I can see her early next week. Today if things have worsened. Ty.

## 2020-06-05 NOTE — Telephone Encounter (Signed)
Appointment already scheduled on Monday 06/08/2020. Patient informed of PCP instructions and verbalized understanding. She had no other questions or concerns.

## 2020-06-07 ENCOUNTER — Other Ambulatory Visit: Payer: Self-pay | Admitting: Family Medicine

## 2020-06-08 ENCOUNTER — Encounter: Payer: Self-pay | Admitting: Family Medicine

## 2020-06-08 ENCOUNTER — Other Ambulatory Visit: Payer: Self-pay

## 2020-06-08 ENCOUNTER — Ambulatory Visit (INDEPENDENT_AMBULATORY_CARE_PROVIDER_SITE_OTHER): Payer: PPO | Admitting: Family Medicine

## 2020-06-08 VITALS — BP 104/64 | HR 106 | Temp 98.4°F | Ht 59.0 in | Wt 140.4 lb

## 2020-06-08 DIAGNOSIS — M7632 Iliotibial band syndrome, left leg: Secondary | ICD-10-CM | POA: Diagnosis not present

## 2020-06-08 DIAGNOSIS — S76012A Strain of muscle, fascia and tendon of left hip, initial encounter: Secondary | ICD-10-CM | POA: Diagnosis not present

## 2020-06-08 MED ORDER — NAPROXEN 500 MG PO TBEC: 500.0000 mg | DELAYED_RELEASE_TABLET | Freq: Two times a day (BID) | ORAL | 0 refills | Status: DC

## 2020-06-08 MED ORDER — HYDROCODONE-ACETAMINOPHEN 5-325 MG PO TABS: 1.0000 | ORAL_TABLET | Freq: Four times a day (QID) | ORAL | 0 refills | Status: DC | PRN

## 2020-06-08 NOTE — Patient Instructions (Addendum)
Ice/cold pack over area for 10-15 min twice daily.  Heat (pad or rice pillow in microwave) over affected area, 10-15 minutes twice daily.   OK to take Tylenol 1000 mg (2 extra strength tabs) or 975 mg (3 regular strength tabs) every 6 hours as needed.  If you do not hear anything about your referral in the next 1-2 weeks, call our office and ask for an update.  Let us know if you need anything.

## 2020-06-08 NOTE — Progress Notes (Signed)
Musculoskeletal Exam  Patient: Brenda Dunn DOB: Oct 19, 1950  DOS: 06/08/2020  SUBJECTIVE:  Chief Complaint:   Chief Complaint  Patient presents with  . Groin Pain    Brenda Dunn is a 69 y.o.  female for evaluation and treatment of thigh pain.   Onset:  A couple weeks ago. No inj or change in activity.  Location: Thigh Character:  aching and sharp  Progression of issue:  is unchanged Associated symptoms: no bruising, swelling, or redness Treatment: to date has been ice, OTC NSAIDS, acetaminophen, home exercises and heat.   Neurovascular symptoms: no  Past Medical History:  Diagnosis Date  . Anxiety   . Benzodiazepine dependence (HCC)   . Colonic polyp    mulitple polyps. repeat in 05/2019  . Constipation    IBS  . COPD (chronic obstructive pulmonary disease) (HCC)   . Depression   . GERD (gastroesophageal reflux disease)   . Hyperlipidemia   . Hypertension   . Insomnia   . Migraine headache   . OAB (overactive bladder)   . Osteoarthritis   . Osteoporosis 12/19/2017  . Post-surgical hypothyroidism   . Pulmonary embolism (HCC)    Eliquis to stop 05/03/19  . Scoliosis     Objective: VITAL SIGNS: BP 104/64 (BP Location: Left Arm, Patient Position: Sitting, Cuff Size: Normal)   Pulse (!) 106   Temp 98.4 F (36.9 C) (Oral)   Ht 4\' 11"  (1.499 m)   Wt 140 lb 6 oz (63.7 kg)   SpO2 96%   BMI 28.35 kg/m  Constitutional: Well formed, well developed. No acute distress. Thorax & Lungs: No accessory muscle use Musculoskeletal: L thigh.   Normal active range of motion: yes.   Normal passive range of motion: yes Tenderness to palpation: yes over distal IT band Deformity: no Ecchymosis: no Tests positive: Ober's Tests negative: log roll, Stinchfield Neurologic: Normal sensory function. Antalgic gait. Psychiatric: Normal mood. Age appropriate judgment and insight. Alert & oriented x 3.    Assessment:  Iliotibial band syndrome of left side - Plan:  HYDROcodone-acetaminophen (NORCO) 5-325 MG tablet, naproxen (EC NAPROSYN) 500 MG EC tablet  Plan: Stretches/exercises, heat, ice, Norco for breakthrough pain. She has been on Norco before and tolerated well. Warnings about this medication verbalized and written down. PT vs sports med if no better  F/u as originally scheduled. The patient voiced understanding and agreement to the plan.  Greater than 30 minutes were spent face to face with the patient, reviewing her chart and devising her treatment plan  , DO 06/08/20  9:35 AM

## 2020-06-09 ENCOUNTER — Other Ambulatory Visit: Payer: Self-pay | Admitting: Family Medicine

## 2020-06-09 DIAGNOSIS — E890 Postprocedural hypothyroidism: Secondary | ICD-10-CM

## 2020-06-11 ENCOUNTER — Other Ambulatory Visit: Payer: Self-pay | Admitting: Family Medicine

## 2020-06-11 DIAGNOSIS — M7632 Iliotibial band syndrome, left leg: Secondary | ICD-10-CM

## 2020-06-12 ENCOUNTER — Telehealth: Payer: Self-pay | Admitting: Family Medicine

## 2020-06-12 MED ORDER — PREDNISONE 20 MG PO TABS: 40.0000 mg | ORAL_TABLET | Freq: Every day | ORAL | 0 refills | Status: DC

## 2020-06-12 NOTE — Telephone Encounter (Signed)
Caller : [place]  Call Back # [phone]   HYDROcodone-acetaminophen (NORCO) 5-325 MG tablet    Patient states pain medication is not helping her , she needs an increase in medication or  would like to be switched to another medication.  Please Advise

## 2020-06-12 NOTE — Telephone Encounter (Signed)
Last written: 06/08/20 Last ov: 06/08/20 Next ov: 11/20/20 Contract: none UDS: none

## 2020-06-16 ENCOUNTER — Other Ambulatory Visit: Payer: Self-pay | Admitting: Family Medicine

## 2020-07-08 ENCOUNTER — Other Ambulatory Visit: Payer: Self-pay | Admitting: Family Medicine

## 2020-07-08 DIAGNOSIS — M7632 Iliotibial band syndrome, left leg: Secondary | ICD-10-CM

## 2020-07-20 ENCOUNTER — Telehealth: Payer: Self-pay | Admitting: Family Medicine

## 2020-07-20 DIAGNOSIS — E669 Obesity, unspecified: Secondary | ICD-10-CM

## 2020-07-20 MED ORDER — OZEMPIC (1 MG/DOSE) 2 MG/1.5ML ~~LOC~~ SOPN: 1.0000 mg | PEN_INJECTOR | SUBCUTANEOUS | 5 refills | Status: DC

## 2020-07-20 NOTE — Telephone Encounter (Signed)
Patient is requesting a 90day supply of  Medication: Semaglutide, 1 MG/DOSE, (OZEMPIC, 1 MG/DOSE,) 2 MG/1.5ML SOPN    Has the patient contacted their pharmacy? No. (If no, request that the patient contact the pharmacy for the refill.) (If yes, when and what did the pharmacy advise?)  Preferred Pharmacy (with phone number or street name): [phone] [place], [place] - [address]  [address] [place],  [place] [address]  Phone:  [phone] Fax:  [phone]  DEA #:  [id number]  Agent: Please be advised that RX refills may take up to 3 business days. We ask that you follow-up with your pharmacy.

## 2020-07-25 ENCOUNTER — Other Ambulatory Visit: Payer: Self-pay | Admitting: Family Medicine

## 2020-07-25 DIAGNOSIS — J302 Other seasonal allergic rhinitis: Secondary | ICD-10-CM

## 2020-07-25 DIAGNOSIS — M7632 Iliotibial band syndrome, left leg: Secondary | ICD-10-CM

## 2020-08-11 ENCOUNTER — Telehealth (INDEPENDENT_AMBULATORY_CARE_PROVIDER_SITE_OTHER): Payer: PPO | Admitting: Psychiatry

## 2020-08-11 ENCOUNTER — Encounter: Payer: Self-pay | Admitting: Psychiatry

## 2020-08-11 DIAGNOSIS — F39 Unspecified mood [affective] disorder: Secondary | ICD-10-CM | POA: Diagnosis not present

## 2020-08-11 DIAGNOSIS — F5101 Primary insomnia: Secondary | ICD-10-CM | POA: Diagnosis not present

## 2020-08-11 DIAGNOSIS — F411 Generalized anxiety disorder: Secondary | ICD-10-CM

## 2020-08-11 MED ORDER — MIRTAZAPINE 45 MG PO TABS: 45.0000 mg | ORAL_TABLET | Freq: Every day | ORAL | 1 refills | Status: DC

## 2020-08-11 MED ORDER — DIAZEPAM 5 MG PO TABS: 5.0000 mg | ORAL_TABLET | Freq: Three times a day (TID) | ORAL | 5 refills | Status: DC

## 2020-08-11 MED ORDER — RISPERIDONE 1 MG PO TABS: 1.0000 mg | ORAL_TABLET | Freq: Every day | ORAL | 1 refills | Status: DC

## 2020-08-11 MED ORDER — GABAPENTIN 400 MG PO CAPS: ORAL_CAPSULE | ORAL | 1 refills | Status: DC

## 2020-08-11 NOTE — Progress Notes (Signed)
Brenda PonderSonja E Hillenburg [id number] 11/23/1950 70 y.o.  Virtual Visit via Telephone Note  I connected with pt on 08/11/20 at  3:30 PM EST by telephone and verified that I am speaking with the correct person using two identifiers.   I discussed the limitations, risks, security and privacy concerns of performing an evaluation and management service by telephone and the availability of in person appointments. I also discussed with the patient that there may be a patient responsible charge related to this service. The patient expressed understanding and agreed to proceed.   I discussed the assessment and treatment plan with the patient. The patient was provided an opportunity to ask questions and all were answered. The patient agreed with the plan and demonstrated an understanding of the instructions.   The patient was advised to call back or seek an in-person evaluation if the symptoms worsen or if the condition fails to improve as anticipated.  I provided 30 minutes of non-face-to-face time during this encounter.  The patient was located at home.  [facility]   Corie ChiquitoJessica Shantera Monts, PMHNP   Subjective:   Patient ID:  Brenda Dunn is a 70 y.o. (DOB 11/05/1950) female.  Chief Complaint:  Chief Complaint  Patient presents with  . Anxiety  . Sleeping Problem  . Depression    HPI Brenda PonderSonja E Zeisler presents for follow-up of anxiety, depression, and insomnia. She reports that her 69 yo granddaughter overdosed on Fentanyl and cocaine on 04/04/20 and was in the hospital until 04/16/20 and was in ICU. Granddaughter died and she was the child of her decreased son. Her husband has had 11 surgeries recently. Her husband is now "bedridden" and total care with a PICC line and she is trying to care for him. She reports that he has been refusing to eat despite her encouraging him to eat and offering different foods. She reports that she is having to manage his medications. She is also having  to do the chores and tasks that husband would typically do. She is waking up throughout the night to administer his medication. Goes to bed at 10 pm and awakens between 2-3 am to administer his meds.   She reports that her stomach "stays in knots" and has had constipation in response to stress and anxiety.  "My insides go a 100 mph." She reports frequent worry and is ruminating about husband not getting enough nutrition and protein intake. She reports that she has frustration about husband refusing to eat. She reports that she has some mild depression. She reports that her appetite has fluctuated. Energy is low. She reports that motivation depends on "what it is." She reports that she is motivated to do what husband needs for his health and what's needed for their house. She reports chronic difficulty with concentration. Denies SI.   She reports that she has been taking 1/2 extra tab of Diazepam at lunch time since husband came home from the hospital.   Review of Systems:  Review of Systems  Gastrointestinal: Positive for constipation.  Musculoskeletal: Positive for arthralgias and back pain. Negative for gait problem.  Psychiatric/Behavioral:       Please refer to HPI    Medications: I have reviewed the patient's current medications.  Current Outpatient Medications  Medication Sig Dispense Refill  . acetaminophen (TYLENOL) 500 MG tablet Take 1,000 mg by mouth every 6 (six) hours as needed for moderate pain.    Marland Kitchen. albuterol (VENTOLIN HFA) 108 (90 Base) MCG/ACT inhaler  Inhale 2 puffs into the lungs every 4 (four) hours as needed for wheezing or shortness of breath. 2 each 3  . alendronate (FOSAMAX) 70 MG tablet TAKE 1 TABLET EVERY WEEK WITH FULL GLASS OF WATER ON EMPTY STOMACH (Patient taking differently: Take 70 mg by mouth every Thursday.) 12 tablet 3  . aspirin EC 81 MG tablet Take 1 tablet (81 mg total) by mouth daily. 150 tablet 2  . atorvastatin (LIPITOR) 40 MG tablet TAKE 1 TABLET BY  MOUTH EVERYDAY AT BEDTIME (Patient taking differently: Take 40 mg by mouth at bedtime.) 90 tablet 1  . budesonide-formoterol (SYMBICORT) 80-4.5 MCG/ACT inhaler TAKE 2 PUFFS BY MOUTH TWICE A DAY 3 each 0  . Calcium Carbonate-Vitamin D (CALTRATE 600+D) 600-400 MG-UNIT tablet Take 1 tablet by mouth 2 (two) times daily. 60 tablet 11  . docusate sodium (COLACE) 100 MG capsule Take 100-200 mg by mouth at bedtime.    Marland Kitchen. EC-NAPROXEN 500 MG EC tablet TAKE 1 TABLET (500 MG TOTAL) BY MOUTH 2 (TWO) TIMES DAILY AS NEEDED. TAKE WITH MEALS 60 tablet 0  . esomeprazole (NEXIUM) 40 MG capsule TAKE 1 CAPSULE BY MOUTH EVERY DAY 90 capsule 1  . levocetirizine (XYZAL) 5 MG tablet TAKE 1 TABLET BY MOUTH EVERY DAY IN THE EVENING 90 tablet 1  . levothyroxine (SYNTHROID) 112 MCG tablet TAKE 1 TABLET BY MOUTH EVERY DAY 90 tablet 1  . LINZESS 290 MCG CAPS capsule TAKE 1 CAPSULE EVERY DAY ON EMPTY STOMACH AT LEAST 30 MINUTES PRIOR TO 1ST MEAL OF THE DAY 90 capsule 1  . lisinopril (ZESTRIL) 40 MG tablet TAKE 1 TABLET BY MOUTH EVERY DAY 90 tablet 1  . montelukast (SINGULAIR) 10 MG tablet Take 1 tablet (10 mg total) by mouth at bedtime. 90 tablet 2  . MYRBETRIQ 50 MG TB24 tablet TAKE 1 TABLET BY MOUTH EVERY DAY 90 tablet 1  . pantoprazole (PROTONIX) 40 MG tablet TAKE 1 TABLET BY MOUTH EVERY DAY 90 tablet 1  . Probiotic Product (PROBIOTIC DAILY PO) Take 2 tablets by mouth daily.    . Semaglutide, 1 MG/DOSE, (OZEMPIC, 1 MG/DOSE,) 2 MG/1.5ML SOPN Inject 1 mg into the skin once a week. 3 mL 5  . clopidogrel (PLAVIX) 75 MG tablet Take 1 tablet (75 mg total) by mouth daily. 30 tablet 11  . diazepam (VALIUM) 5 MG tablet Take 1 tablet (5 mg total) by mouth in the morning, at noon, and at bedtime. 90 tablet 5  . gabapentin (NEURONTIN) 400 MG capsule TAKE 1 CAPSULE TWICE A DAY AND TAKE 4 CAPSULES AT BEDTIME 540 capsule 1  . mirtazapine (REMERON) 45 MG tablet Take 1 tablet (45 mg total) by mouth at bedtime. 90 tablet 1  . risperiDONE  (RISPERDAL) 1 MG tablet Take 1 tablet (1 mg total) by mouth at bedtime. 90 tablet 1   No current facility-administered medications for this visit.    Medication Side Effects: None   Denies tremors or involuntary movements Allergies: No Known Allergies  Past Medical History:  Diagnosis Date  . Anxiety   . Benzodiazepine dependence (HCC)   . Colonic polyp    mulitple polyps. repeat in 05/2019  . Constipation    IBS  . COPD (chronic obstructive pulmonary disease) (HCC)   . Depression   . GERD (gastroesophageal reflux disease)   . Hyperlipidemia   . Hypertension   . Insomnia   . Migraine headache   . OAB (overactive bladder)   . Osteoarthritis   . Osteoporosis 12/19/2017  .  Post-surgical hypothyroidism   . Pulmonary embolism (HCC)    Eliquis to stop 05/03/19  . Scoliosis     Family History  Problem Relation Age of Onset  . Breast cancer Mother   . Arthritis Mother   . Hearing loss Mother   . Hyperlipidemia Mother   . Hypertension Mother   . Anxiety disorder Mother   . Lung cancer Maternal Uncle   . Anxiety disorder Maternal Uncle   . Lung cancer Maternal Grandmother   . Heart attack Maternal Grandmother   . Anxiety disorder Maternal Grandmother   . Asthma Father   . COPD Father   . Hyperlipidemia Father   . Hypertension Father   . Alcohol abuse Father   . Anxiety disorder Sister   . Hypertension Sister   . Thyroid cancer Sister   . Anxiety disorder Brother   . Hypertension Brother   . Drug abuse Son   . Anxiety disorder Sister   . Hypertension Sister   . Anxiety disorder Sister   . Hypertension Sister   . Hyperlipidemia Son     Social History   Socioeconomic History  . Marital status: Married    Spouse name: Not on file  . Number of children: Not on file  . Years of education: Not on file  . Highest education level: Not on file  Occupational History  . Occupation: disabled due "my nerves"  Tobacco Use  . Smoking status: Current Every Day Smoker     Packs/day: 1.00    Years: 44.00    Pack years: 44.00    Types: Cigarettes  . Smokeless tobacco: Never Used  Vaping Use  . Vaping Use: Former  Substance and Sexual Activity  . Alcohol use: No  . Drug use: No  . Sexual activity: Not on file  Other Topics Concern  . Not on file  Social History Narrative  . Not on file   Social Determinants of Health   Financial Resource Strain: Low Risk   . Difficulty of Paying Living Expenses: Not hard at all  Food Insecurity: No Food Insecurity  . Worried About Programme researcher, broadcasting/film/video in the Last Year: Never true  . Ran Out of Food in the Last Year: Never true  Transportation Needs: No Transportation Needs  . Lack of Transportation (Medical): No  . Lack of Transportation (Non-Medical): No  Physical Activity: Not on file  Stress: Not on file  Social Connections: Not on file  Intimate Partner Violence: Not on file    Past Medical History, Surgical history, Social history, and Family history were reviewed and updated as appropriate.   Please see review of systems for further details on the patient's review from today.   Objective:   Physical Exam:  There were no vitals taken for this visit.  Physical Exam Neurological:     Mental Status: She is alert and oriented to person, place, and time.     Cranial Nerves: No dysarthria.  Psychiatric:        Attention and Perception: Attention and perception normal.        Mood and Affect: Mood is anxious.        Speech: Speech normal.        Behavior: Behavior is cooperative.        Thought Content: Thought content normal. Thought content is not paranoid or delusional. Thought content does not include homicidal or suicidal ideation. Thought content does not include homicidal or suicidal plan.  Cognition and Memory: Cognition and memory normal.        Judgment: Judgment normal.     Comments: Insight intact Rumination Dysphoric mood     Lab Review:     Component Value Date/Time   NA 137  03/24/2020 0921   NA 140 10/02/2017 1700   K 4.4 03/24/2020 0921   CL 104 03/24/2020 0921   CO2 26 10/09/2019 0924   GLUCOSE 97 03/24/2020 0921   BUN 23 03/24/2020 0921   BUN 11 10/02/2017 1700   CREATININE 0.80 03/24/2020 0921   CREATININE 0.98 05/18/2017 1426   CALCIUM 10.4 10/09/2019 0924   PROT 6.9 05/04/2018 1129   ALBUMIN 4.5 05/04/2018 1129   AST 15 05/04/2018 1129   ALT 9 05/04/2018 1129   ALKPHOS 64 05/04/2018 1129   BILITOT 0.2 05/04/2018 1129   GFRNONAA 56 (L) 04/13/2019 0915   GFRNONAA 88 05/10/2017 1023   GFRAA >60 04/13/2019 0915   GFRAA 102 05/10/2017 1023       Component Value Date/Time   WBC 6.5 04/13/2019 0915   RBC 3.83 (L) 04/13/2019 0915   HGB 11.2 (L) 03/24/2020 0921   HGB 11.6 08/28/2017 1142   HCT 33.0 (L) 03/24/2020 0921   HCT 33.2 (L) 08/28/2017 1142   PLT 93 (L) 04/13/2019 0915   PLT 222 08/28/2017 1142   MCV 103.4 (H) 04/13/2019 0915   MCV 88 08/28/2017 1142   MCH 32.4 04/13/2019 0915   MCHC 31.3 04/13/2019 0915   RDW 13.5 04/13/2019 0915   RDW 13.4 08/28/2017 1142   LYMPHSABS 1.7 04/13/2019 0915   LYMPHSABS 1.7 08/28/2017 1142   MONOABS 0.4 04/13/2019 0915   EOSABS 0.5 04/13/2019 0915   EOSABS 0.1 08/28/2017 1142   BASOSABS 0.0 04/13/2019 0915   BASOSABS 0.0 08/28/2017 1142    No results found for: POCLITH, LITHIUM   No results found for: PHENYTOIN, PHENOBARB, VALPROATE, CBMZ   .res Assessment: Plan:   Discussed that diazepam would be increased to 5 mg 3 times daily to improve severe anxiety.  Discussed that diazepam would not be increased further. Continue Remeron 45 mg at bedtime since this has been helpful for her mood, anxiety, and insomnia. Continue risperidone 1 mg at bedtime since this has been helpful for mood, anxiety, and insomnia.  Patient denies any involuntary movements. Continue gabapentin 400 mg twice daily and 1600 mg at bedtime for anxiety and insomnia. Patient follow-up in 6 months or sooner if clinically  indicated. Patient advised to contact office with any questions, adverse effects, or acute worsening in signs and symptoms. She Bailee was seen today for anxiety, sleeping problem and depression.  Diagnoses and all orders for this visit:  Primary insomnia -     diazepam (VALIUM) 5 MG tablet; Take 1 tablet (5 mg total) by mouth in the morning, at noon, and at bedtime. -     gabapentin (NEURONTIN) 400 MG capsule; TAKE 1 CAPSULE TWICE A DAY AND TAKE 4 CAPSULES AT BEDTIME -     mirtazapine (REMERON) 45 MG tablet; Take 1 tablet (45 mg total) by mouth at bedtime.  Generalized anxiety disorder -     diazepam (VALIUM) 5 MG tablet; Take 1 tablet (5 mg total) by mouth in the morning, at noon, and at bedtime. -     gabapentin (NEURONTIN) 400 MG capsule; TAKE 1 CAPSULE TWICE A DAY AND TAKE 4 CAPSULES AT BEDTIME -     mirtazapine (REMERON) 45 MG tablet; Take 1 tablet (45 mg total)  by mouth at bedtime.  Episodic mood disorder (HCC) -     risperiDONE (RISPERDAL) 1 MG tablet; Take 1 tablet (1 mg total) by mouth at bedtime. -     mirtazapine (REMERON) 45 MG tablet; Take 1 tablet (45 mg total) by mouth at bedtime.    Please see After Visit Summary for patient specific instructions.  Future Appointments  Date Time Provider Department Center  11/20/2020  7:45 AM Carmelia Roller, Jilda Roche, DO [facility]    No orders of the defined types were placed in this encounter.     -------------------------------

## 2020-09-24 ENCOUNTER — Telehealth: Payer: Self-pay | Admitting: Family Medicine

## 2020-09-24 MED ORDER — TRULICITY 1.5 MG/0.5ML ~~LOC~~ SOAJ: 1.5000 mg | SUBCUTANEOUS | 1 refills | Status: DC

## 2020-09-24 NOTE — Telephone Encounter (Signed)
Caller Brenda Dunn  Call Back @ [phone]  [facility] states Ozempic is on back order,patient is requesting a substation for medication.   Please advise

## 2020-09-24 NOTE — Telephone Encounter (Signed)
Trulicity 1.5 mg weekly, ok to send. Ty.

## 2020-09-24 NOTE — Telephone Encounter (Signed)
Rx sent 

## 2020-10-05 ENCOUNTER — Other Ambulatory Visit: Payer: Self-pay | Admitting: Family Medicine

## 2020-10-05 ENCOUNTER — Telehealth: Payer: Self-pay | Admitting: Family Medicine

## 2020-10-05 MED ORDER — TRAZODONE HCL 50 MG PO TABS: 25.0000 mg | ORAL_TABLET | Freq: Every evening | ORAL | 3 refills | Status: DC | PRN

## 2020-10-05 NOTE — Telephone Encounter (Signed)
Spoke w pt regarding the recent death of her husband. Will send in trazodone qhs prn. Please sched fu appt w me in 2 weeks for bereavement. Ty.

## 2020-10-05 NOTE — Telephone Encounter (Signed)
Pt says she will call and schedule he appointment when she feels like she is ready to.   She also wanted me to mention to you that her husband was Dx with COVID at time of death. But she is confused as to how he contracted the vis because he does not walk well enough to leave the house.

## 2020-10-14 ENCOUNTER — Other Ambulatory Visit: Payer: Self-pay

## 2020-10-14 ENCOUNTER — Encounter: Payer: Self-pay | Admitting: Family Medicine

## 2020-10-14 ENCOUNTER — Ambulatory Visit (INDEPENDENT_AMBULATORY_CARE_PROVIDER_SITE_OTHER): Payer: PPO | Admitting: Family Medicine

## 2020-10-14 VITALS — BP 94/44 | HR 85 | Temp 97.8°F | Resp 18 | Ht 59.0 in | Wt 131.0 lb

## 2020-10-14 DIAGNOSIS — Z634 Disappearance and death of family member: Secondary | ICD-10-CM | POA: Diagnosis not present

## 2020-10-14 MED ORDER — PRAZOSIN HCL 1 MG PO CAPS: 1.0000 mg | ORAL_CAPSULE | Freq: Every day | ORAL | 2 refills | Status: DC

## 2020-10-14 MED ORDER — TRULICITY 1.5 MG/0.5ML ~~LOC~~ SOAJ: 1.5000 mg | SUBCUTANEOUS | 5 refills | Status: DC

## 2020-10-14 NOTE — Patient Instructions (Addendum)
Please consider counseling. Contact [phone] to schedule an appointment or inquire about cost/insurance coverage.  Go to the ER/call an ambulance if you are getting lightheaded or dizzy.   Coping skills Choose 5 that work for you:  Take a deep breath  Count to 20  Read a book  Do a puzzle  Meditate  Bake  Sing  Knit  Garden  Pray  Go outside  Call a friend  Listen to music  Take a walk  Color  Send a note  Take a bath  Watch a movie  Be alone in a quiet place  Pet an animal  Visit a friend  Journal  Exercise  Stretch

## 2020-10-14 NOTE — Progress Notes (Signed)
Chief Complaint  Patient presents with  . Depression    Loss of husband two weeks ago    Subjective: Patient is a 69 y.o. female here for depression.  2 weeks ago the patient lost her husband.  She has had much difficulty sleeping, tearfulness, and depression since then.  She has no interest in bereavement counseling or therapy at this time.  She has no thoughts of harming herself or others.  She is not self-medicating.  She has dealt with bereavement in the past after losing her son around 6 years ago.  She states things are better than that.  She does see the psychiatry team and is currently taking Risperdal in addition to Valium 5 mg 3 times daily.  She has been on trazodone in the past which has not been helpful.  She is currently taking mirtazapine 45 mg nightly.  Past Medical History:  Diagnosis Date  . Anxiety   . Benzodiazepine dependence (HCC)   . Colonic polyp    mulitple polyps. repeat in 05/2019  . Constipation    IBS  . COPD (chronic obstructive pulmonary disease) (HCC)   . Depression   . GERD (gastroesophageal reflux disease)   . Hyperlipidemia   . Hypertension   . Insomnia   . Migraine headache   . OAB (overactive bladder)   . Osteoarthritis   . Osteoporosis 12/19/2017  . Post-surgical hypothyroidism   . Pulmonary embolism (HCC)    Eliquis to stop 05/03/19  . Scoliosis     Objective: BP (!) 94/44 (BP Location: Left Arm, Patient Position: Sitting, Cuff Size: Normal)   Pulse 85   Temp 97.8 F (36.6 C)   Resp 18   Ht 4\' 11"  (1.499 m)   Wt 131 lb (59.4 kg)   SpO2 99%   BMI 26.46 kg/m  General: Awake, appears stated age Lungs: No accessory muscle use Psych: Age appropriate judgment and insight, normal affect and mood throughout most of the exam but she did become tearful at times  Assessment and Plan: Bereavement - Plan: prazosin (MINIPRESS) 1 MG capsule  [facility] information provided.  Try prazosin.  Continue all of her other psychiatric medications.   Counseled on exercise.  Would consider Belsomra versus ramelteon versus a TCA if no improvement.  I will see her in 1 month. The patient voiced understanding and agreement to the plan.  [place], DO 10/14/20  11:46 AM

## 2020-10-20 ENCOUNTER — Other Ambulatory Visit: Payer: Self-pay | Admitting: Family Medicine

## 2020-10-29 ENCOUNTER — Telehealth: Payer: Self-pay | Admitting: Psychiatry

## 2020-10-29 DIAGNOSIS — F39 Unspecified mood [affective] disorder: Secondary | ICD-10-CM

## 2020-10-29 MED ORDER — RISPERIDONE 1 MG PO TABS: 1.5000 mg | ORAL_TABLET | Freq: Every day | ORAL | 1 refills | Status: DC

## 2020-10-29 NOTE — Telephone Encounter (Signed)
Brenda Dunn called and reported that her husband has passed.  She is having difficulties right now and would like to discuss something to help her cope through this time.  She did mention that her PCP was prescribing something for sleep but it wasn't helping.  She has appt 11/30/20 and is now on the waiting list, but would to discuss help for now.  Please call.

## 2020-10-29 NOTE — Telephone Encounter (Signed)
Please review

## 2020-10-29 NOTE — Telephone Encounter (Signed)
Pt has been informed.

## 2020-10-29 NOTE — Telephone Encounter (Signed)
Reviewed

## 2020-10-29 NOTE — Telephone Encounter (Signed)
Please tell her I am sorry to hear of her husband passing. I recommend increasing Risperdal to 1.5 tabs at bedtime since this seemed to help in the past for her sleep and anxiety. Please advise her to call back if this does not help or if she has side effects.

## 2020-11-03 ENCOUNTER — Ambulatory Visit: Payer: PPO

## 2020-11-03 ENCOUNTER — Encounter (HOSPITAL_COMMUNITY): Payer: PPO

## 2020-11-11 ENCOUNTER — Other Ambulatory Visit: Payer: Self-pay

## 2020-11-11 ENCOUNTER — Encounter: Payer: Self-pay | Admitting: Family Medicine

## 2020-11-11 ENCOUNTER — Ambulatory Visit (INDEPENDENT_AMBULATORY_CARE_PROVIDER_SITE_OTHER): Payer: PPO | Admitting: Family Medicine

## 2020-11-11 VITALS — BP 108/72 | HR 90 | Temp 98.4°F | Ht 59.0 in | Wt 133.0 lb

## 2020-11-11 DIAGNOSIS — M8588 Other specified disorders of bone density and structure, other site: Secondary | ICD-10-CM

## 2020-11-11 DIAGNOSIS — Z634 Disappearance and death of family member: Secondary | ICD-10-CM | POA: Diagnosis not present

## 2020-11-11 MED ORDER — BUDESONIDE-FORMOTEROL FUMARATE 80-4.5 MCG/ACT IN AERO
INHALATION_SPRAY | RESPIRATORY_TRACT | 2 refills | Status: DC
Start: 2020-11-11 — End: 2021-04-20

## 2020-11-11 MED ORDER — PRAZOSIN HCL 2 MG PO CAPS: 2.0000 mg | ORAL_CAPSULE | Freq: Every day | ORAL | 2 refills | Status: DC

## 2020-11-11 NOTE — Progress Notes (Signed)
Chief Complaint  Patient presents with  . Follow-up    Subjective: Patient is a 70 y.o. female here for f/u bereavement.  Pt being tx'd for bereavement. Her spouse passed away around 6 weeks ago. She takes Risperdal and Valium from psychiatry in addition to Remeron 45 mg/d. She reports compliance with this. She was rx'd trazodone that did not help her sleep. That was d/c'd and Prazosin 1 mg qhs was started. She is compliant and reports no AE's. She notices improvement with her sleep on this medication. She is not interested in seeing a bereavement counselor at this time.   Pt w hx of osteoporosis. She has low back pain also. She is wondering if Vit D at 5000 units daily would help. No current routine exercise. No neuro s/s's.   Past Medical History:  Diagnosis Date  . Anxiety   . Benzodiazepine dependence (HCC)   . Colonic polyp    mulitple polyps. repeat in 05/2019  . Constipation    IBS  . COPD (chronic obstructive pulmonary disease) (HCC)   . Depression   . GERD (gastroesophageal reflux disease)   . Hyperlipidemia   . Hypertension   . Insomnia   . Migraine headache   . OAB (overactive bladder)   . Osteoarthritis   . Osteoporosis 12/19/2017  . Post-surgical hypothyroidism   . Pulmonary embolism (HCC)    Eliquis to stop 05/03/19  . Scoliosis     Objective: BP 108/72 (BP Location: Left Arm, Patient Position: Sitting, Cuff Size: Normal)   Pulse 90   Temp 98.4 F (36.9 C) (Oral)   Ht 4\' 11"  (1.499 m)   Wt 133 lb (60.3 kg)   SpO2 95%   BMI 26.86 kg/m  General: Awake, appears stated age HEENT: MMM, EOMi Heart: RRR, no murmurs Lungs: CTAB, no rales, wheezes or rhonchi. No accessory muscle use Psych: Age appropriate judgment and insight, normal affect and mood  Assessment and Plan: Bereavement - Plan: prazosin (MINIPRESS) 2 MG capsule  Osteopenia of lumbar spine  1. Chronic. Improving. Cont psych meds as above. Increase dosage of prazosin form 1 mg/d to 2 mg/d.  Appreciate psych team.  2. Chronic, controlled. OK to cont Ca and Vit D. Can take 5000 u daily.  F/u in 2 mo for CPE.  The patient voiced understanding and agreement to the plan.  [place], DO 11/11/20  10:55 AM

## 2020-11-11 NOTE — Patient Instructions (Addendum)
Please consider counseling. Contact [phone] to schedule an appointment or inquire about cost/insurance coverage.  Aim to do some physical exertion for 150 minutes per week. This is typically divided into 5 days per week, 30 minutes per day. The activity should be enough to get your heart rate up. Anything is better than nothing if you have time constraints.  You can take Vit D3 5000 units daily.   Let us know if you need anything.

## 2020-11-18 ENCOUNTER — Other Ambulatory Visit: Payer: Self-pay | Admitting: Family Medicine

## 2020-11-20 ENCOUNTER — Encounter: Payer: PPO | Admitting: Family Medicine

## 2020-11-30 ENCOUNTER — Telehealth (INDEPENDENT_AMBULATORY_CARE_PROVIDER_SITE_OTHER): Payer: PPO | Admitting: Psychiatry

## 2020-11-30 ENCOUNTER — Encounter: Payer: Self-pay | Admitting: Psychiatry

## 2020-11-30 DIAGNOSIS — F411 Generalized anxiety disorder: Secondary | ICD-10-CM | POA: Diagnosis not present

## 2020-11-30 DIAGNOSIS — F5101 Primary insomnia: Secondary | ICD-10-CM

## 2020-11-30 DIAGNOSIS — F39 Unspecified mood [affective] disorder: Secondary | ICD-10-CM | POA: Diagnosis not present

## 2020-11-30 MED ORDER — MIRTAZAPINE 45 MG PO TABS: 45.0000 mg | ORAL_TABLET | Freq: Every day | ORAL | 1 refills | Status: DC

## 2020-11-30 MED ORDER — DIAZEPAM 5 MG PO TABS: 5.0000 mg | ORAL_TABLET | Freq: Three times a day (TID) | ORAL | 3 refills | Status: DC

## 2020-11-30 MED ORDER — RISPERIDONE 2 MG PO TABS: 2.0000 mg | ORAL_TABLET | Freq: Every day | ORAL | 1 refills | Status: DC

## 2020-11-30 MED ORDER — GABAPENTIN 400 MG PO CAPS: ORAL_CAPSULE | ORAL | 1 refills | Status: DC

## 2020-11-30 NOTE — Progress Notes (Signed)
Brenda Dunn [id number] 02/06/1951 70 y.o.  Virtual Visit via Telephone Note  I connected with pt on 11/30/20 at 11:00 AM EDT by telephone and verified that I am speaking with the correct person using two identifiers.   I discussed the limitations, risks, security and privacy concerns of performing an evaluation and management service by telephone and the availability of in person appointments. I also discussed with the patient that there may be a patient responsible charge related to this service. The patient expressed understanding and agreed to proceed.   I discussed the assessment and treatment plan with the patient. The patient was provided an opportunity to ask questions and all were answered. The patient agreed with the plan and demonstrated an understanding of the instructions.   The patient was advised to call back or seek an in-person evaluation if the symptoms worsen or if the condition fails to improve as anticipated.  I provided 30 minutes of non-face-to-face time during this encounter.  The patient was located at home.  The provider was located at [facility].   Corie Chiquito, PMHNP   Subjective:   Patient ID:  Brenda Dunn is a 70 y.o. (DOB September 30, 1950) female.  Chief Complaint:  Chief Complaint  Patient presents with  . Anxiety    HPI Brenda Dunn presents for follow-up of depression, anxiety, and insomnia. She reports that her husband died on 10/11/22.  She also lost her granddaughter in September 2021 and son in 2016. Yesterday would have been her husband's birthday. She reports, "I have my good days and bad days." She reports sad mood related to grief. "Overall I would have thought I would have taken it harder, but it never goes away."  She reports that she is doing what she has to do, like go to the grocery store, dishes, and laundry. She reports diminished interest in things and is not doing chores or tasks that are not needed. She reports that at times she will  sit and "look at the walls."  She reports anxiety remains high. Denies panic attacks. Denies irritability. She reports that she is sleeping well overall with Risperdal. Estimates sleeping 6-8 hours. Denies nightmares. She reports that she could not break Risperidone in 1/2 to take 1.5 tabs, so she has been taking 2 tablets. Appetite has been ok. She reports that she can focus on certain things, like driving and finances. She reports poor concentration for TV. Denies SI.   She is now living alone. Sister is supportive, however sister works full-time.   She reports that husband had a long illness.    Past Psychiatric Medication Trials: Remeron- Had worsening depression and insomnia with 30 mg dose. Olanzapine Seroquel  Risperdal-helpful for mood, anxiety, and insomnia Diazepam Gabapentin Trazodone Trileptal- hyponatremia Ambien  Review of Systems:  Review of Systems  Musculoskeletal: Negative for gait problem.  Neurological: Negative for tremors.       Denies any involuntary movements  Psychiatric/Behavioral:       Please refer to HPI    Medications: I have reviewed the patient's current medications.  Current Outpatient Medications  Medication Sig Dispense Refill  . acetaminophen (TYLENOL) 500 MG tablet Take 1,000 mg by mouth every 6 (six) hours as needed for moderate pain.    Marland Kitchen albuterol (VENTOLIN HFA) 108 (90 Base) MCG/ACT inhaler Inhale 2 puffs into the lungs every 4 (four) hours as needed for wheezing or shortness of breath. 2 each 3  . alendronate (FOSAMAX) 70 MG tablet TAKE 1  TABLET EVERY WEEK WITH FULL GLASS OF WATER ON EMPTY STOMACH (Patient taking differently: Take 70 mg by mouth every Thursday.) 12 tablet 3  . aspirin EC 81 MG tablet Take 1 tablet (81 mg total) by mouth daily. 150 tablet 2  . atorvastatin (LIPITOR) 40 MG tablet TAKE 1 TABLET BY MOUTH EVERYDAY AT BEDTIME 90 tablet 1  . budesonide-formoterol (SYMBICORT) 80-4.5 MCG/ACT inhaler TAKE 2 PUFFS BY MOUTH TWICE A  DAY 3 each 2  . Calcium Carbonate-Vitamin D (CALTRATE 600+D) 600-400 MG-UNIT tablet Take 1 tablet by mouth 2 (two) times daily. 60 tablet 11  . clopidogrel (PLAVIX) 75 MG tablet Take 1 tablet (75 mg total) by mouth daily. 30 tablet 11  . docusate sodium (COLACE) 100 MG capsule Take 100-200 mg by mouth at bedtime.    Marland Kitchen EC-NAPROXEN 500 MG EC tablet TAKE 1 TABLET (500 MG TOTAL) BY MOUTH 2 (TWO) TIMES DAILY AS NEEDED. TAKE WITH MEALS 60 tablet 0  . esomeprazole (NEXIUM) 40 MG capsule TAKE 1 CAPSULE BY MOUTH EVERY DAY 90 capsule 1  . levocetirizine (XYZAL) 5 MG tablet TAKE 1 TABLET BY MOUTH EVERY DAY IN THE EVENING 90 tablet 1  . levothyroxine (SYNTHROID) 112 MCG tablet TAKE 1 TABLET BY MOUTH EVERY DAY 90 tablet 1  . LINZESS 290 MCG CAPS capsule TAKE 1 CAPSULE EVERY DAY ON EMPTY STOMACH AT LEAST 30 MINUTES PRIOR TO 1ST MEAL OF THE DAY 90 capsule 1  . lisinopril (ZESTRIL) 40 MG tablet TAKE 1 TABLET BY MOUTH EVERY DAY 90 tablet 1  . montelukast (SINGULAIR) 10 MG tablet TAKE 1 TABLET BY MOUTH EVERYDAY AT BEDTIME 90 tablet 2  . MYRBETRIQ 50 MG TB24 tablet TAKE 1 TABLET BY MOUTH EVERY DAY 90 tablet 1  . prazosin (MINIPRESS) 2 MG capsule Take 1 capsule (2 mg total) by mouth at bedtime. 30 capsule 2  . Probiotic Product (PROBIOTIC DAILY PO) Take 2 tablets by mouth daily.    Melene Muller ON 02/09/2021] diazepam (VALIUM) 5 MG tablet Take 1 tablet (5 mg total) by mouth in the morning, at noon, and at bedtime. 90 tablet 3  . gabapentin (NEURONTIN) 400 MG capsule TAKE 1 CAPSULE TWICE A DAY AND TAKE 4 CAPSULES AT BEDTIME 540 capsule 1  . mirtazapine (REMERON) 45 MG tablet Take 1 tablet (45 mg total) by mouth at bedtime. 90 tablet 1  . pantoprazole (PROTONIX) 40 MG tablet TAKE 1 TABLET BY MOUTH EVERY DAY (Patient not taking: Reported on 11/30/2020) 90 tablet 1  . risperiDONE (RISPERDAL) 2 MG tablet Take 1 tablet (2 mg total) by mouth at bedtime. 90 tablet 1   No current facility-administered medications for this visit.     Medication Side Effects: None  Allergies: No Known Allergies  Past Medical History:  Diagnosis Date  . Anxiety   . Benzodiazepine dependence (HCC)   . Colonic polyp    mulitple polyps. repeat in 05/2019  . Constipation    IBS  . COPD (chronic obstructive pulmonary disease) (HCC)   . Depression   . GERD (gastroesophageal reflux disease)   . Hyperlipidemia   . Hypertension   . Insomnia   . Migraine headache   . OAB (overactive bladder)   . Osteoarthritis   . Osteoporosis 12/19/2017  . Post-surgical hypothyroidism   . Pulmonary embolism (HCC)    Eliquis to stop 05/03/19  . Scoliosis     Family History  Problem Relation Age of Onset  . Breast cancer Mother   . Arthritis Mother   .  Hearing loss Mother   . Hyperlipidemia Mother   . Hypertension Mother   . Anxiety disorder Mother   . Lung cancer Maternal Uncle   . Anxiety disorder Maternal Uncle   . Lung cancer Maternal Grandmother   . Heart attack Maternal Grandmother   . Anxiety disorder Maternal Grandmother   . Asthma Father   . COPD Father   . Hyperlipidemia Father   . Hypertension Father   . Alcohol abuse Father   . Anxiety disorder Sister   . Hypertension Sister   . Thyroid cancer Sister   . Anxiety disorder Brother   . Hypertension Brother   . Drug abuse Son   . Anxiety disorder Sister   . Hypertension Sister   . Anxiety disorder Sister   . Hypertension Sister   . Hyperlipidemia Son     Social History   Socioeconomic History  . Marital status: Married    Spouse name: Not on file  . Number of children: Not on file  . Years of education: Not on file  . Highest education level: Not on file  Occupational History  . Occupation: disabled due "my nerves"  Tobacco Use  . Smoking status: Current Every Day Smoker    Packs/day: 1.00    Years: 44.00    Pack years: 44.00    Types: Cigarettes  . Smokeless tobacco: Never Used  Vaping Use  . Vaping Use: Former  Substance and Sexual Activity  .  Alcohol use: No  . Drug use: No  . Sexual activity: Not on file  Other Topics Concern  . Not on file  Social History Narrative  . Not on file   Social Determinants of Health   Financial Resource Strain: Low Risk   . Difficulty of Paying Living Expenses: Not hard at all  Food Insecurity: No Food Insecurity  . Worried About Programme researcher, broadcasting/film/video in the Last Year: Never true  . Ran Out of Food in the Last Year: Never true  Transportation Needs: No Transportation Needs  . Lack of Transportation (Medical): No  . Lack of Transportation (Non-Medical): No  Physical Activity: Not on file  Stress: Not on file  Social Connections: Not on file  Intimate Partner Violence: Not on file    Past Medical History, Surgical history, Social history, and Family history were reviewed and updated as appropriate.   Please see review of systems for further details on the patient's review from today.   Objective:   Physical Exam:  Wt 130 lb (59 kg)   BMI 26.26 kg/m   Physical Exam Neurological:     Mental Status: She is alert and oriented to person, place, and time.     Cranial Nerves: No dysarthria.  Psychiatric:        Attention and Perception: Attention and perception normal.        Speech: Speech normal.        Behavior: Behavior is cooperative.        Thought Content: Thought content normal. Thought content is not paranoid or delusional. Thought content does not include homicidal or suicidal ideation. Thought content does not include homicidal or suicidal plan.        Cognition and Memory: Cognition and memory normal.        Judgment: Judgment normal.     Comments: Insight intact Mood is appropriate to content.  Expresses sadness and grief in response to multiple losses.     Lab Review:     Component Value  Date/Time   NA 137 03/24/2020 0921   NA 140 10/02/2017 1700   K 4.4 03/24/2020 0921   CL 104 03/24/2020 0921   CO2 26 10/09/2019 0924   GLUCOSE 97 03/24/2020 0921   BUN 23  03/24/2020 0921   BUN 11 10/02/2017 1700   CREATININE 0.80 03/24/2020 0921   CREATININE 0.98 05/18/2017 1426   CALCIUM 10.4 10/09/2019 0924   PROT 6.9 05/04/2018 1129   ALBUMIN 4.5 05/04/2018 1129   AST 15 05/04/2018 1129   ALT 9 05/04/2018 1129   ALKPHOS 64 05/04/2018 1129   BILITOT 0.2 05/04/2018 1129   GFRNONAA 56 (L) 04/13/2019 0915   GFRNONAA 88 05/10/2017 1023   GFRAA >60 04/13/2019 0915   GFRAA 102 05/10/2017 1023       Component Value Date/Time   WBC 6.5 04/13/2019 0915   RBC 3.83 (L) 04/13/2019 0915   HGB 11.2 (L) 03/24/2020 0921   HGB 11.6 08/28/2017 1142   HCT 33.0 (L) 03/24/2020 0921   HCT 33.2 (L) 08/28/2017 1142   PLT 93 (L) 04/13/2019 0915   PLT 222 08/28/2017 1142   MCV 103.4 (H) 04/13/2019 0915   MCV 88 08/28/2017 1142   MCH 32.4 04/13/2019 0915   MCHC 31.3 04/13/2019 0915   RDW 13.5 04/13/2019 0915   RDW 13.4 08/28/2017 1142   LYMPHSABS 1.7 04/13/2019 0915   LYMPHSABS 1.7 08/28/2017 1142   MONOABS 0.4 04/13/2019 0915   EOSABS 0.5 04/13/2019 0915   EOSABS 0.1 08/28/2017 1142   BASOSABS 0.0 04/13/2019 0915   BASOSABS 0.0 08/28/2017 1142    No results found for: POCLITH, LITHIUM   No results found for: PHENYTOIN, PHENOBARB, VALPROATE, CBMZ   .res Assessment: Plan:    Patient seen for 30 minutes and time spent discussing treatment options with patient.  She reports that she had difficulty splitting the Risperdal 1 mg tablets and therefore was unable to take 1-1/2 mg at bedtime and has been taking two Risperdal 1 mg tabs without difficulty and has seen an improvement in sleep, mood, and anxiety.  Will therefore continue Risperdal 2 mg at bedtime.  Continue Remeron 45 mg at bedtime for mood and anxiety. Continue gabapentin 400 mg twice daily and 1600 mg at bedtime for anxiety and insomnia. Continue diazepam 5 mg 3 times daily for anxiety.  Patient has 2 refills remaining.  Prescription sent with future date to avoid any lapse in medication. Discussed  option of seeing therapist or bereavement counselor and patient declines at this time.  Offered earlier follow-up appointment, however patient reports that she prefers to follow-up in 6 months. Patient to follow-up with this provider in 6 months or sooner if clinically indicated. Patient advised to contact office with any questions, adverse effects, or acute worsening in signs and symptoms.   Celine MansSonja was seen today for anxiety.  Diagnoses and all orders for this visit:  Episodic mood disorder (HCC) -     risperiDONE (RISPERDAL) 2 MG tablet; Take 1 tablet (2 mg total) by mouth at bedtime. -     mirtazapine (REMERON) 45 MG tablet; Take 1 tablet (45 mg total) by mouth at bedtime.  Primary insomnia -     mirtazapine (REMERON) 45 MG tablet; Take 1 tablet (45 mg total) by mouth at bedtime. -     gabapentin (NEURONTIN) 400 MG capsule; TAKE 1 CAPSULE TWICE A DAY AND TAKE 4 CAPSULES AT BEDTIME -     diazepam (VALIUM) 5 MG tablet; Take 1 tablet (5 mg total)  by mouth in the morning, at noon, and at bedtime.  Generalized anxiety disorder -     mirtazapine (REMERON) 45 MG tablet; Take 1 tablet (45 mg total) by mouth at bedtime. -     gabapentin (NEURONTIN) 400 MG capsule; TAKE 1 CAPSULE TWICE A DAY AND TAKE 4 CAPSULES AT BEDTIME -     diazepam (VALIUM) 5 MG tablet; Take 1 tablet (5 mg total) by mouth in the morning, at noon, and at bedtime.    Please see After Visit Summary for patient specific instructions.  Future Appointments  Date Time Provider Department Center  01/01/2021  9:00 AM [facility] - [facility]  01/01/2021 10:00 AM [facility]  01/01/2021 10:15 AM [facility]  01/13/2021 10:30 AM [name], Jilda Roche, DO [facility]    No orders of the defined types were placed in this encounter.     -------------------------------

## 2020-12-01 ENCOUNTER — Other Ambulatory Visit: Payer: Self-pay | Admitting: Psychiatry

## 2020-12-01 DIAGNOSIS — F39 Unspecified mood [affective] disorder: Secondary | ICD-10-CM

## 2020-12-03 ENCOUNTER — Other Ambulatory Visit: Payer: Self-pay

## 2020-12-03 ENCOUNTER — Telehealth: Payer: Self-pay | Admitting: Psychiatry

## 2020-12-03 DIAGNOSIS — F39 Unspecified mood [affective] disorder: Secondary | ICD-10-CM

## 2020-12-03 MED ORDER — RISPERIDONE 2 MG PO TABS: 2.0000 mg | ORAL_TABLET | Freq: Every day | ORAL | 1 refills | Status: DC

## 2020-12-03 NOTE — Telephone Encounter (Signed)
Margalit called in with refill request for Risperidone 2mg . States that when she called the pharmacy they didn't have it and only had the Mirtazapine. Pharmacy [facility] [address] [place]

## 2020-12-03 NOTE — Telephone Encounter (Signed)
Rx sent 

## 2020-12-04 ENCOUNTER — Other Ambulatory Visit: Payer: Self-pay | Admitting: Family Medicine

## 2020-12-04 DIAGNOSIS — E890 Postprocedural hypothyroidism: Secondary | ICD-10-CM

## 2020-12-07 ENCOUNTER — Other Ambulatory Visit: Payer: Self-pay | Admitting: Psychiatry

## 2020-12-07 ENCOUNTER — Telehealth: Payer: Self-pay | Admitting: Psychiatry

## 2020-12-07 DIAGNOSIS — F39 Unspecified mood [affective] disorder: Secondary | ICD-10-CM

## 2020-12-07 NOTE — Telephone Encounter (Signed)
Rx resent.

## 2020-12-07 NOTE — Telephone Encounter (Signed)
Brenda Dunn called back in today stating that she called the pharmacy and the prescription is still not there. It looks like the prescription was printed but didn't go through. Pls resend Pharmacy [facility] [address] [place]

## 2021-01-01 ENCOUNTER — Encounter (HOSPITAL_COMMUNITY): Payer: PPO

## 2021-01-01 ENCOUNTER — Ambulatory Visit: Payer: PPO

## 2021-01-09 ENCOUNTER — Other Ambulatory Visit: Payer: Self-pay | Admitting: Family Medicine

## 2021-01-09 DIAGNOSIS — Z634 Disappearance and death of family member: Secondary | ICD-10-CM

## 2021-01-13 ENCOUNTER — Encounter: Payer: PPO | Admitting: Family Medicine

## 2021-02-07 ENCOUNTER — Other Ambulatory Visit: Payer: Self-pay | Admitting: Family Medicine

## 2021-02-07 DIAGNOSIS — Z634 Disappearance and death of family member: Secondary | ICD-10-CM

## 2021-02-16 ENCOUNTER — Other Ambulatory Visit: Payer: Self-pay

## 2021-02-16 ENCOUNTER — Ambulatory Visit (INDEPENDENT_AMBULATORY_CARE_PROVIDER_SITE_OTHER): Payer: PPO | Admitting: Family Medicine

## 2021-02-16 ENCOUNTER — Encounter: Payer: Self-pay | Admitting: Family Medicine

## 2021-02-16 ENCOUNTER — Telehealth: Payer: Self-pay | Admitting: Family Medicine

## 2021-02-16 VITALS — BP 110/68 | HR 84 | Temp 99.3°F | Ht 59.0 in | Wt 134.1 lb

## 2021-02-16 DIAGNOSIS — R5381 Other malaise: Secondary | ICD-10-CM | POA: Diagnosis not present

## 2021-02-16 DIAGNOSIS — Z634 Disappearance and death of family member: Secondary | ICD-10-CM

## 2021-02-16 DIAGNOSIS — G250 Essential tremor: Secondary | ICD-10-CM | POA: Diagnosis not present

## 2021-02-16 DIAGNOSIS — E785 Hyperlipidemia, unspecified: Secondary | ICD-10-CM | POA: Diagnosis not present

## 2021-02-16 DIAGNOSIS — F39 Unspecified mood [affective] disorder: Secondary | ICD-10-CM | POA: Diagnosis not present

## 2021-02-16 DIAGNOSIS — D696 Thrombocytopenia, unspecified: Secondary | ICD-10-CM | POA: Diagnosis not present

## 2021-02-16 DIAGNOSIS — Z23 Encounter for immunization: Secondary | ICD-10-CM | POA: Diagnosis not present

## 2021-02-16 DIAGNOSIS — I771 Stricture of artery: Secondary | ICD-10-CM | POA: Diagnosis not present

## 2021-02-16 DIAGNOSIS — E890 Postprocedural hypothyroidism: Secondary | ICD-10-CM

## 2021-02-16 DIAGNOSIS — R911 Solitary pulmonary nodule: Secondary | ICD-10-CM

## 2021-02-16 DIAGNOSIS — Z0000 Encounter for general adult medical examination without abnormal findings: Secondary | ICD-10-CM

## 2021-02-16 DIAGNOSIS — IMO0001 Reserved for inherently not codable concepts without codable children: Secondary | ICD-10-CM

## 2021-02-16 LAB — LIPID PANEL
Cholesterol: 209 mg/dL — ABNORMAL HIGH (ref 0–200)
HDL: 40.8 mg/dL (ref >39.00)
NonHDL: 168.38
Total CHOL/HDL Ratio: 5
Triglycerides: 265 mg/dL — ABNORMAL HIGH (ref 0.0–149.0)
VLDL: 53 mg/dL — ABNORMAL HIGH (ref 0.0–40.0)

## 2021-02-16 LAB — CBC
HCT: 39.1 % (ref 36.0–46.0)
Hemoglobin: 13.3 g/dL (ref 12.0–15.0)
MCHC: 34 g/dL (ref 30.0–36.0)
MCV: 94.3 fl (ref 78.0–100.0)
Platelets: 190 10*3/uL (ref 150.0–400.0)
RBC: 4.15 Mil/uL (ref 3.87–5.11)
RDW: 13.3 % (ref 11.5–15.5)
WBC: 5.8 10*3/uL (ref 4.0–10.5)

## 2021-02-16 LAB — COMPREHENSIVE METABOLIC PANEL
ALT: 8 U/L (ref 0–35)
AST: 12 U/L (ref 0–37)
Albumin: 4.6 g/dL (ref 3.5–5.2)
Alkaline Phosphatase: 79 U/L (ref 39–117)
BUN: 10 mg/dL (ref 6–23)
CO2: 25 mEq/L (ref 19–32)
Calcium: 10.4 mg/dL (ref 8.4–10.5)
Chloride: 103 mEq/L (ref 96–112)
Creatinine, Ser: 0.79 mg/dL (ref 0.40–1.20)
GFR: 76.35 mL/min (ref >60.00)
Glucose, Bld: 89 mg/dL (ref 70–99)
Potassium: 3.6 mEq/L (ref 3.5–5.1)
Sodium: 139 mEq/L (ref 135–145)
Total Bilirubin: 0.4 mg/dL (ref 0.2–1.2)
Total Protein: 6.9 g/dL (ref 6.0–8.3)

## 2021-02-16 LAB — LDL CHOLESTEROL, DIRECT: Direct LDL: 128 mg/dL

## 2021-02-16 MED ORDER — PRAZOSIN HCL 5 MG PO CAPS: 5.0000 mg | ORAL_CAPSULE | Freq: Every day | ORAL | 2 refills | Status: DC

## 2021-02-16 MED ORDER — PROPRANOLOL HCL ER 60 MG PO CP24: 60.0000 mg | ORAL_CAPSULE | Freq: Every day | ORAL | 2 refills | Status: DC

## 2021-02-16 MED ORDER — PRAZOSIN HCL 2 MG PO CAPS: 2.0000 mg | ORAL_CAPSULE | Freq: Every day | ORAL | 3 refills | Status: DC

## 2021-02-16 MED ORDER — TRULICITY 1.5 MG/0.5ML ~~LOC~~ SOAJ: 1.5000 mg | SUBCUTANEOUS | 5 refills | Status: AC

## 2021-02-16 MED ORDER — PRAZOSIN HCL 1 MG PO CAPS: 1.0000 mg | ORAL_CAPSULE | Freq: Every day | ORAL | 3 refills | Status: DC

## 2021-02-16 NOTE — Progress Notes (Signed)
Chief Complaint  Patient presents with   Follow-up     Well Woman Brenda Dunn is here for a complete physical.   Her last physical was >1 year ago.  Current diet: in general, a "healthy" diet. Current exercise: none. Weight is stable and she denies daytime fatigue. Seatbelt? Yes  Health Maintenance Colonoscopy- Yes Shingrix- No Lung cancer screening- Needs to f/u on lung nodule DEXA- Yes Mammogram- Yes Tetanus- No Pneumonia- Due Hep C screen- No  Shaking Over the past month, the patient has had shaking when she does things.  2 of her sisters have tremors.  She is not having any weakness or dropping things.  No injuries to the head recently.  She has never been on medicine for this in the past.  Bereavement The patient continues to have issues since the passing of her husband.  She is currently taking prazosin 3 mg nightly which is helpful.  At 2 mg nightly she will have poor sleep.  Unfortunately, her insurance will not authorize higher doses too soon.  She is not having any adverse effects with the medication.  Past Medical History:  Diagnosis Date   Anxiety    Benzodiazepine dependence (HCC)    Colonic polyp    mulitple polyps. repeat in 05/2019   Constipation    IBS   COPD (chronic obstructive pulmonary disease) (HCC)    Depression    GERD (gastroesophageal reflux disease)    Hyperlipidemia    Hypertension    Insomnia    Migraine headache    OAB (overactive bladder)    Osteoarthritis    Osteoporosis 12/19/2017   Post-surgical hypothyroidism    Pulmonary embolism (HCC)    Eliquis to stop 05/03/19   Scoliosis      Past Surgical History:  Procedure Laterality Date   ABDOMINAL AORTOGRAM W/LOWER EXTREMITY Bilateral 03/24/2020   Procedure: ABDOMINAL AORTOGRAM W/LOWER EXTREMITY;  Surgeon: Nada Libman, MD;  [facility];  Service: Cardiovascular;  Laterality: Bilateral;   BREAST CYST ASPIRATION Left    HEMORRHOID SURGERY     PERIPHERAL VASCULAR  INTERVENTION Bilateral 03/24/2020   Procedure: PERIPHERAL VASCULAR INTERVENTION;  Surgeon: Nada Libman, MD;  Location: [facility];  Service: Cardiovascular;  Laterality: Bilateral;  ILIAC STENTS   THYROIDECTOMY     at [facility]   TUBAL LIGATION  1979    Medications  Current Outpatient Medications on File Prior to Visit  Medication Sig Dispense Refill   acetaminophen (TYLENOL) 500 MG tablet Take 1,000 mg by mouth every 6 (six) hours as needed for moderate pain.     albuterol (VENTOLIN HFA) 108 (90 Base) MCG/ACT inhaler Inhale 2 puffs into the lungs every 4 (four) hours as needed for wheezing or shortness of breath. 2 each 3   alendronate (FOSAMAX) 70 MG tablet TAKE 1 TABLET EVERY WEEK WITH FULL GLASS OF WATER ON EMPTY STOMACH (Patient taking differently: Take 70 mg by mouth every Thursday.) 12 tablet 3   aspirin EC 81 MG tablet Take 1 tablet (81 mg total) by mouth daily. 150 tablet 2   atorvastatin (LIPITOR) 40 MG tablet TAKE 1 TABLET BY MOUTH EVERYDAY AT BEDTIME 90 tablet 1   budesonide-formoterol (SYMBICORT) 80-4.5 MCG/ACT inhaler TAKE 2 PUFFS BY MOUTH TWICE A DAY 3 each 2   Calcium Carbonate-Vitamin D (CALTRATE 600+D) 600-400 MG-UNIT tablet Take 1 tablet by mouth 2 (two) times daily. 60 tablet 11   clopidogrel (PLAVIX) 75 MG tablet Take 1 tablet (75 mg  total) by mouth daily. 30 tablet 11   diazepam (VALIUM) 5 MG tablet Take 1 tablet (5 mg total) by mouth in the morning, at noon, and at bedtime. 90 tablet 3   docusate sodium (COLACE) 100 MG capsule Take 100-200 mg by mouth at bedtime.     EC-NAPROXEN 500 MG EC tablet TAKE 1 TABLET (500 MG TOTAL) BY MOUTH 2 (TWO) TIMES DAILY AS NEEDED. TAKE WITH MEALS 60 tablet 0   esomeprazole (NEXIUM) 40 MG capsule TAKE 1 CAPSULE BY MOUTH EVERY DAY 90 capsule 1   gabapentin (NEURONTIN) 400 MG capsule TAKE 1 CAPSULE TWICE A DAY AND TAKE 4 CAPSULES AT BEDTIME 540 capsule 1   levocetirizine (XYZAL) 5 MG tablet TAKE 1 TABLET BY MOUTH EVERY DAY IN THE  EVENING 90 tablet 1   levothyroxine (SYNTHROID) 112 MCG tablet TAKE 1 TABLET BY MOUTH EVERY DAY 90 tablet 1   LINZESS 290 MCG CAPS capsule TAKE 1 CAPSULE EVERY DAY ON EMPTY STOMACH AT LEAST 30 MINUTES PRIOR TO 1ST MEAL OF THE DAY 90 capsule 1   lisinopril (ZESTRIL) 40 MG tablet TAKE 1 TABLET BY MOUTH EVERY DAY 90 tablet 1   mirtazapine (REMERON) 45 MG tablet Take 1 tablet (45 mg total) by mouth at bedtime. 90 tablet 1   montelukast (SINGULAIR) 10 MG tablet TAKE 1 TABLET BY MOUTH EVERYDAY AT BEDTIME 90 tablet 2   MYRBETRIQ 50 MG TB24 tablet TAKE 1 TABLET BY MOUTH EVERY DAY 90 tablet 1   pantoprazole (PROTONIX) 40 MG tablet TAKE 1 TABLET BY MOUTH EVERY DAY 90 tablet 1   prazosin (MINIPRESS) 1 MG capsule TAKE 1 CAPSULE BY MOUTH AT BEDTIME. 90 capsule 0   prazosin (MINIPRESS) 2 MG capsule TAKE 1 CAPSULE BY MOUTH AT BEDTIME. 90 capsule 0   Probiotic Product (PROBIOTIC DAILY PO) Take 2 tablets by mouth daily.     risperiDONE (RISPERDAL) 1 MG tablet TAKE 1 TABLET BY MOUTH AT BEDTIME. 90 tablet 1   risperiDONE (RISPERDAL) 2 MG tablet Take 1 tablet (2 mg total) by mouth at bedtime. 90 tablet 1   Allergies No Known Allergies  Review of Systems: Constitutional:  no fevers Eye:  no recent significant change in vision Ears:  No changes in hearing Nose/Mouth/Throat:  no complaints of nasal congestion, no sore throat Cardiovascular: no chest pain Respiratory:  No shortness of breath Gastrointestinal:  No change in bowel habits GU:  Female: negative for dysuria Integumentary:  no abnormal skin lesions reported Neurologic:  +tremor Endocrine:  denies unexplained weight changes  Exam BP 110/68   Pulse 84   Temp 99.3 F (37.4 C) (Oral)   Ht 4\' 11"  (1.499 m)   Wt 134 lb 2 oz (60.8 kg)   SpO2 94%   BMI 27.09 kg/m  General:  well developed, well nourished, in no apparent distress Skin:  no significant moles, warts, or growths Head:  no masses, lesions, or tenderness Eyes:  pupils equal and  round, sclera anicteric without injection Ears:  canals without lesions, TMs shiny without retraction, no obvious effusion, no erythema Nose:  nares patent, septum midline, mucosa normal, and no drainage or sinus tenderness Throat/Pharynx:  lips and gingiva without lesion; tongue and uvula midline; non-inflamed pharynx; no exudates or postnasal drainage Neck: neck supple without adenopathy, thyromegaly, or masses Lungs:  clear to auscultation, breath sounds equal bilaterally, no respiratory distress Cardio:  regular rate and rhythm, no bruits or LE edema Abdomen:  abdomen soft, nontender; bowel sounds normal; no masses  or organomegaly Genital: Deferred Neuro:  gait normal; deep tendon reflexes normal and symmetric; + intention tremor of bilateral hands Psych: well oriented with normal range of affect and appropriate judgment/insight  Assessment and Plan  Well adult exam  Bilateral iliac artery stenosis (HCC)  Thrombocytopenia (HCC) - Plan: CBC  Episodic mood disorder (HCC), Chronic  Postoperative hypothyroidism  Hyperlipidemia, unspecified hyperlipidemia type - Plan: Comprehensive metabolic panel, Lipid panel  Bereavement - Plan: prazosin (MINIPRESS) 5 MG capsule  Lung nodule < 6cm on CT - Plan: CT Chest Wo Contrast  Benign essential tremor - Plan: propranolol ER (INDERAL LA) 60 MG 24 hr capsule  Physical deconditioning - Plan: Ambulatory referral to Home Health  Need for Tdap vaccination - Plan: Tdap vaccine greater than or equal to 7yo IM  Need for vaccination against Streptococcus pneumoniae - Plan: Pneumococcal conjugate vaccine 20-valent (Prevnar 20)   Well 70 y.o. female. Counseled on diet and exercise. Bereavement: Not controlled.  We will increase the doses to 5 mg nightly in hopes that insurance will cover it.  She is tolerating well. Benign essential tremor: New, uncertain prognosis at this time.  We will start propranolol LA 60 mg daily.  Follow-up in 1 month to  recheck this. Other orders as above. The patient voiced understanding and agreement to the plan.  Jilda Roche [place], DO 02/16/21 10:40 AM

## 2021-02-16 NOTE — Patient Instructions (Addendum)
Give us 2-3 business days to get the results of your labs back.   Keep the diet clean and stay active.  Aim to do some physical exertion for 150 minutes per week. This is typically divided into 5 days per week, 30 minutes per day. The activity should be enough to get your heart rate up. Anything is better than nothing if you have time constraints.  Let us know if you need anything.  

## 2021-02-16 NOTE — Telephone Encounter (Signed)
The patient would like to cancel her Home Health PT referral. She stated her husband got Covid due to home health coming into their home and she prefers at this time to cancel her Home Health PT .

## 2021-02-17 ENCOUNTER — Other Ambulatory Visit: Payer: Self-pay | Admitting: Family Medicine

## 2021-02-17 MED ORDER — FENOFIBRATE 48 MG PO TABS: 48.0000 mg | ORAL_TABLET | Freq: Every day | ORAL | 3 refills | Status: DC

## 2021-02-17 NOTE — Addendum Note (Signed)
Addended by: Scharlene Gloss B on: 02/17/2021 08:21 AM   Modules accepted: Orders

## 2021-02-18 ENCOUNTER — Telehealth: Payer: Self-pay | Admitting: Family Medicine

## 2021-02-18 NOTE — Telephone Encounter (Signed)
Copied from CRM [id number]. Topic: Medicare AWV >> Feb 18, 2021 10:46 AM Harris-Coley, [place]on Gully wrote: Reason for CRM: Left message for patient to schedule Annual Wellness Visit.  Please schedule with Health Nurse Advisor Clare Gandy. at [facility].

## 2021-02-24 ENCOUNTER — Other Ambulatory Visit: Payer: Self-pay | Admitting: Family Medicine

## 2021-02-24 DIAGNOSIS — J302 Other seasonal allergic rhinitis: Secondary | ICD-10-CM

## 2021-02-28 ENCOUNTER — Other Ambulatory Visit: Payer: Self-pay | Admitting: Family Medicine

## 2021-03-03 ENCOUNTER — Telehealth: Payer: Self-pay

## 2021-03-03 DIAGNOSIS — R5381 Other malaise: Secondary | ICD-10-CM

## 2021-03-03 NOTE — Telephone Encounter (Signed)
OK to do-

## 2021-03-03 NOTE — Telephone Encounter (Signed)
Pt called in wanted to have the referral for home therapy for weakness of legs.

## 2021-03-03 NOTE — Telephone Encounter (Signed)
Referral to Home Health done

## 2021-03-05 ENCOUNTER — Ambulatory Visit (HOSPITAL_BASED_OUTPATIENT_CLINIC_OR_DEPARTMENT_OTHER)
Admission: RE | Admit: 2021-03-05 | Discharge: 2021-03-05 | Disposition: A | Discharge status: Home / Self Care | Payer: PPO | Source: Ambulatory Visit | Attending: Family Medicine | Admitting: Family Medicine

## 2021-03-05 ENCOUNTER — Other Ambulatory Visit: Payer: Self-pay

## 2021-03-05 DIAGNOSIS — IMO0001 Reserved for inherently not codable concepts without codable children: Secondary | ICD-10-CM

## 2021-03-05 DIAGNOSIS — R911 Solitary pulmonary nodule: Secondary | ICD-10-CM | POA: Diagnosis not present

## 2021-03-05 DIAGNOSIS — R918 Other nonspecific abnormal finding of lung field: Secondary | ICD-10-CM | POA: Diagnosis not present

## 2021-03-05 NOTE — Telephone Encounter (Signed)
Pt aware.

## 2021-03-11 ENCOUNTER — Other Ambulatory Visit: Payer: Self-pay | Admitting: Family Medicine

## 2021-03-11 ENCOUNTER — Other Ambulatory Visit: Payer: Self-pay | Admitting: Surgery

## 2021-03-11 DIAGNOSIS — Z634 Disappearance and death of family member: Secondary | ICD-10-CM

## 2021-03-11 DIAGNOSIS — G250 Essential tremor: Secondary | ICD-10-CM

## 2021-03-12 NOTE — Telephone Encounter (Signed)
patient needs appt, last note indicates a discontinuation of plavix

## 2021-03-15 DIAGNOSIS — G4700 Insomnia, unspecified: Secondary | ICD-10-CM | POA: Diagnosis not present

## 2021-03-15 DIAGNOSIS — F329 Major depressive disorder, single episode, unspecified: Secondary | ICD-10-CM | POA: Diagnosis not present

## 2021-03-15 DIAGNOSIS — G2581 Restless legs syndrome: Secondary | ICD-10-CM | POA: Diagnosis not present

## 2021-03-15 DIAGNOSIS — E669 Obesity, unspecified: Secondary | ICD-10-CM | POA: Diagnosis not present

## 2021-03-15 DIAGNOSIS — G250 Essential tremor: Secondary | ICD-10-CM | POA: Diagnosis not present

## 2021-03-15 DIAGNOSIS — Z7982 Long term (current) use of aspirin: Secondary | ICD-10-CM | POA: Diagnosis not present

## 2021-03-15 DIAGNOSIS — I10 Essential (primary) hypertension: Secondary | ICD-10-CM | POA: Diagnosis not present

## 2021-03-15 DIAGNOSIS — M810 Age-related osteoporosis without current pathological fracture: Secondary | ICD-10-CM | POA: Diagnosis not present

## 2021-03-15 DIAGNOSIS — Z955 Presence of coronary angioplasty implant and graft: Secondary | ICD-10-CM | POA: Diagnosis not present

## 2021-03-15 DIAGNOSIS — N3281 Overactive bladder: Secondary | ICD-10-CM | POA: Diagnosis not present

## 2021-03-15 DIAGNOSIS — R911 Solitary pulmonary nodule: Secondary | ICD-10-CM | POA: Diagnosis not present

## 2021-03-15 DIAGNOSIS — J449 Chronic obstructive pulmonary disease, unspecified: Secondary | ICD-10-CM | POA: Diagnosis not present

## 2021-03-15 DIAGNOSIS — Z7951 Long term (current) use of inhaled steroids: Secondary | ICD-10-CM | POA: Diagnosis not present

## 2021-03-15 DIAGNOSIS — E890 Postprocedural hypothyroidism: Secondary | ICD-10-CM | POA: Diagnosis not present

## 2021-03-15 DIAGNOSIS — E785 Hyperlipidemia, unspecified: Secondary | ICD-10-CM | POA: Diagnosis not present

## 2021-03-15 DIAGNOSIS — Z86010 Personal history of colonic polyps: Secondary | ICD-10-CM | POA: Diagnosis not present

## 2021-03-15 DIAGNOSIS — G43909 Migraine, unspecified, not intractable, without status migrainosus: Secondary | ICD-10-CM | POA: Diagnosis not present

## 2021-03-15 DIAGNOSIS — K589 Irritable bowel syndrome without diarrhea: Secondary | ICD-10-CM | POA: Diagnosis not present

## 2021-03-15 DIAGNOSIS — F39 Unspecified mood [affective] disorder: Secondary | ICD-10-CM | POA: Diagnosis not present

## 2021-03-15 DIAGNOSIS — M1990 Unspecified osteoarthritis, unspecified site: Secondary | ICD-10-CM | POA: Diagnosis not present

## 2021-03-15 DIAGNOSIS — Z6826 Body mass index (BMI) 26.0-26.9, adult: Secondary | ICD-10-CM | POA: Diagnosis not present

## 2021-03-15 DIAGNOSIS — K219 Gastro-esophageal reflux disease without esophagitis: Secondary | ICD-10-CM | POA: Diagnosis not present

## 2021-03-15 DIAGNOSIS — F17210 Nicotine dependence, cigarettes, uncomplicated: Secondary | ICD-10-CM | POA: Diagnosis not present

## 2021-03-15 DIAGNOSIS — F411 Generalized anxiety disorder: Secondary | ICD-10-CM | POA: Diagnosis not present

## 2021-03-15 DIAGNOSIS — Z7902 Long term (current) use of antithrombotics/antiplatelets: Secondary | ICD-10-CM | POA: Diagnosis not present

## 2021-03-18 DIAGNOSIS — Z7982 Long term (current) use of aspirin: Secondary | ICD-10-CM | POA: Diagnosis not present

## 2021-03-18 DIAGNOSIS — E669 Obesity, unspecified: Secondary | ICD-10-CM | POA: Diagnosis not present

## 2021-03-18 DIAGNOSIS — N3281 Overactive bladder: Secondary | ICD-10-CM | POA: Diagnosis not present

## 2021-03-18 DIAGNOSIS — R911 Solitary pulmonary nodule: Secondary | ICD-10-CM | POA: Diagnosis not present

## 2021-03-18 DIAGNOSIS — I10 Essential (primary) hypertension: Secondary | ICD-10-CM | POA: Diagnosis not present

## 2021-03-18 DIAGNOSIS — E890 Postprocedural hypothyroidism: Secondary | ICD-10-CM | POA: Diagnosis not present

## 2021-03-18 DIAGNOSIS — F411 Generalized anxiety disorder: Secondary | ICD-10-CM | POA: Diagnosis not present

## 2021-03-18 DIAGNOSIS — Z7902 Long term (current) use of antithrombotics/antiplatelets: Secondary | ICD-10-CM | POA: Diagnosis not present

## 2021-03-18 DIAGNOSIS — K589 Irritable bowel syndrome without diarrhea: Secondary | ICD-10-CM | POA: Diagnosis not present

## 2021-03-18 DIAGNOSIS — F17210 Nicotine dependence, cigarettes, uncomplicated: Secondary | ICD-10-CM | POA: Diagnosis not present

## 2021-03-18 DIAGNOSIS — Z86010 Personal history of colonic polyps: Secondary | ICD-10-CM | POA: Diagnosis not present

## 2021-03-18 DIAGNOSIS — J449 Chronic obstructive pulmonary disease, unspecified: Secondary | ICD-10-CM | POA: Diagnosis not present

## 2021-03-18 DIAGNOSIS — Z7951 Long term (current) use of inhaled steroids: Secondary | ICD-10-CM | POA: Diagnosis not present

## 2021-03-18 DIAGNOSIS — F39 Unspecified mood [affective] disorder: Secondary | ICD-10-CM | POA: Diagnosis not present

## 2021-03-18 DIAGNOSIS — Z6826 Body mass index (BMI) 26.0-26.9, adult: Secondary | ICD-10-CM | POA: Diagnosis not present

## 2021-03-18 DIAGNOSIS — G4700 Insomnia, unspecified: Secondary | ICD-10-CM | POA: Diagnosis not present

## 2021-03-18 DIAGNOSIS — M1990 Unspecified osteoarthritis, unspecified site: Secondary | ICD-10-CM | POA: Diagnosis not present

## 2021-03-18 DIAGNOSIS — E785 Hyperlipidemia, unspecified: Secondary | ICD-10-CM | POA: Diagnosis not present

## 2021-03-18 DIAGNOSIS — F329 Major depressive disorder, single episode, unspecified: Secondary | ICD-10-CM | POA: Diagnosis not present

## 2021-03-18 DIAGNOSIS — G43909 Migraine, unspecified, not intractable, without status migrainosus: Secondary | ICD-10-CM | POA: Diagnosis not present

## 2021-03-18 DIAGNOSIS — K219 Gastro-esophageal reflux disease without esophagitis: Secondary | ICD-10-CM | POA: Diagnosis not present

## 2021-03-18 DIAGNOSIS — G250 Essential tremor: Secondary | ICD-10-CM | POA: Diagnosis not present

## 2021-03-18 DIAGNOSIS — G2581 Restless legs syndrome: Secondary | ICD-10-CM | POA: Diagnosis not present

## 2021-03-18 DIAGNOSIS — Z955 Presence of coronary angioplasty implant and graft: Secondary | ICD-10-CM | POA: Diagnosis not present

## 2021-03-18 DIAGNOSIS — M810 Age-related osteoporosis without current pathological fracture: Secondary | ICD-10-CM | POA: Diagnosis not present

## 2021-03-19 ENCOUNTER — Other Ambulatory Visit: Payer: Self-pay | Admitting: Family Medicine

## 2021-03-23 ENCOUNTER — Ambulatory Visit (INDEPENDENT_AMBULATORY_CARE_PROVIDER_SITE_OTHER): Payer: PPO | Admitting: Family Medicine

## 2021-03-23 ENCOUNTER — Encounter: Payer: Self-pay | Admitting: Family Medicine

## 2021-03-23 ENCOUNTER — Other Ambulatory Visit: Payer: Self-pay

## 2021-03-23 VITALS — BP 110/64 | HR 75 | Temp 98.0°F | Ht 59.5 in | Wt 129.0 lb

## 2021-03-23 DIAGNOSIS — M545 Low back pain, unspecified: Secondary | ICD-10-CM

## 2021-03-23 DIAGNOSIS — Z634 Disappearance and death of family member: Secondary | ICD-10-CM

## 2021-03-23 DIAGNOSIS — G250 Essential tremor: Secondary | ICD-10-CM | POA: Insufficient documentation

## 2021-03-23 DIAGNOSIS — G8929 Other chronic pain: Secondary | ICD-10-CM | POA: Diagnosis not present

## 2021-03-23 DIAGNOSIS — F411 Generalized anxiety disorder: Secondary | ICD-10-CM

## 2021-03-23 MED ORDER — BUSPIRONE HCL 7.5 MG PO TABS: 7.5000 mg | ORAL_TABLET | Freq: Two times a day (BID) | ORAL | 1 refills | Status: DC

## 2021-03-23 MED ORDER — ALBUTEROL SULFATE HFA 108 (90 BASE) MCG/ACT IN AERS: 2.0000 | INHALATION_SPRAY | RESPIRATORY_TRACT | 3 refills | Status: DC | PRN

## 2021-03-23 MED ORDER — PROPRANOLOL HCL ER 80 MG PO CP24: 80.0000 mg | ORAL_CAPSULE | Freq: Every day | ORAL | 2 refills | Status: DC

## 2021-03-23 MED ORDER — DICLOFENAC SODIUM 1 % EX GEL: 2.0000 g | Freq: Four times a day (QID) | CUTANEOUS | 1 refills | Status: DC

## 2021-03-23 NOTE — Patient Instructions (Addendum)
Keep the diet clean and stay active.  Heat (pad or rice pillow in microwave) over affected area, 10-15 minutes twice daily.   OK to take Tylenol 1000 mg (2 extra strength tabs) or 975 mg (3 regular strength tabs) every 6 hours as needed.  Let us know if you need anything.  EXERCISES  RANGE OF MOTION (ROM) AND STRETCHING EXERCISES - Low Back Pain Most people with lower back pain will find that their symptoms get worse with excessive bending forward (flexion) or arching at the lower back (extension). The exercises that will help resolve your symptoms will focus on the opposite motion.  If you have pain, numbness or tingling which travels down into your buttocks, leg or foot, the goal of the therapy is for these symptoms to move closer to your back and eventually resolve. Sometimes, these leg symptoms will get better, but your lower back pain may worsen. This is often an indication of progress in your rehabilitation. Be very alert to any changes in your symptoms and the activities in which you participated in the 24 hours prior to the change. Sharing this information with your caregiver will allow him or her to most efficiently treat your condition. These exercises may help you when beginning to rehabilitate your injury. Your symptoms may resolve with or without further involvement from your physician, physical therapist or athletic trainer. While completing these exercises, remember:  Restoring tissue flexibility helps normal motion to return to the joints. This allows healthier, less painful movement and activity. An effective stretch should be held for at least 30 seconds. A stretch should never be painful. You should only feel a gentle lengthening or release in the stretched tissue. FLEXION RANGE OF MOTION AND STRETCHING EXERCISES:  STRETCH - Flexion, Single Knee to Chest  Lie on a firm bed or floor with both legs extended in front of you. Keeping one leg in contact with the floor, bring your  opposite knee to your chest. Hold your leg in place by either grabbing behind your thigh or at your knee. Pull until you feel a gentle stretch in your low back. Hold 30 seconds. Slowly release your grasp and repeat the exercise with the opposite side. Repeat 2 times. Complete this exercise 3 times per week.   STRETCH - Flexion, Double Knee to Chest Lie on a firm bed or floor with both legs extended in front of you. Keeping one leg in contact with the floor, bring your opposite knee to your chest. Tense your stomach muscles to support your back and then lift your other knee to your chest. Hold your legs in place by either grabbing behind your thighs or at your knees. Pull both knees toward your chest until you feel a gentle stretch in your low back. Hold 30 seconds. Tense your stomach muscles and slowly return one leg at a time to the floor. Repeat 2 times. Complete this exercise 3 times per week.   STRETCH - Low Trunk Rotation Lie on a firm bed or floor. Keeping your legs in front of you, bend your knees so they are both pointed toward the ceiling and your feet are flat on the floor. Extend your arms out to the side. This will stabilize your upper body by keeping your shoulders in contact with the floor. Gently and slowly drop both knees together to one side until you feel a gentle stretch in your low back. Hold for 30 seconds. Tense your stomach muscles to support your lower back as you  bring your knees back to the starting position. Repeat the exercise to the other side. Repeat 2 times. Complete this exercise at least 3 times per week.   EXTENSION RANGE OF MOTION AND FLEXIBILITY EXERCISES:  STRETCH - Extension, Prone on Elbows  Lie on your stomach on the floor, a bed will be too soft. Place your palms about shoulder width apart and at the height of your head. Place your elbows under your shoulders. If this is too painful, stack pillows under your chest. Allow your body to relax so that  your hips drop lower and make contact more completely with the floor. Hold this position for 30 seconds. Slowly return to lying flat on the floor. Repeat 2 times. Complete this exercise 3 times per week.   RANGE OF MOTION - Extension, Prone Press Ups Lie on your stomach on the floor, a bed will be too soft. Place your palms about shoulder width apart and at the height of your head. Keeping your back as relaxed as possible, slowly straighten your elbows while keeping your hips on the floor. You may adjust the placement of your hands to maximize your comfort. As you gain motion, your hands will come more underneath your shoulders. Hold this position 30 seconds. Slowly return to lying flat on the floor. Repeat 2 times. Complete this exercise 3 times per week.   RANGE OF MOTION- Quadruped, Neutral Spine  Assume a hands and knees position on a firm surface. Keep your hands under your shoulders and your knees under your hips. You may place padding under your knees for comfort. Drop your head and point your tailbone toward the ground below you. This will round out your lower back like an angry cat. Hold this position for 30 seconds. Slowly lift your head and release your tail bone so that your back sags into a large arch, like an old horse. Hold this position for 30 seconds. Repeat this until you feel limber in your low back. Now, find your "sweet spot." This will be the most comfortable position somewhere between the two previous positions. This is your neutral spine. Once you have found this position, tense your stomach muscles to support your low back. Hold this position for 30 seconds. Repeat 2 times. Complete this exercise 3 times per week.   STRENGTHENING EXERCISES - Low Back Sprain These exercises may help you when beginning to rehabilitate your injury. These exercises should be done near your "sweet spot." This is the neutral, low-back arch, somewhere between fully rounded and fully arched,  that is your least painful position. When performed in this safe range of motion, these exercises can be used for people who have either a flexion or extension based injury. These exercises may resolve your symptoms with or without further involvement from your physician, physical therapist or athletic trainer. While completing these exercises, remember:  Muscles can gain both the endurance and the strength needed for everyday activities through controlled exercises. Complete these exercises as instructed by your physician, physical therapist or athletic trainer. Increase the resistance and repetitions only as guided. You may experience muscle soreness or fatigue, but the pain or discomfort you are trying to eliminate should never worsen during these exercises. If this pain does worsen, stop and make certain you are following the directions exactly. If the pain is still present after adjustments, discontinue the exercise until you can discuss the trouble with your caregiver.  STRENGTHENING - Deep Abdominals, Pelvic Tilt  Lie on a firm bed or  floor. Keeping your legs in front of you, bend your knees so they are both pointed toward the ceiling and your feet are flat on the floor. Tense your lower abdominal muscles to press your low back into the floor. This motion will rotate your pelvis so that your tail bone is scooping upwards rather than pointing at your feet or into the floor. With a gentle tension and even breathing, hold this position for 3 seconds. Repeat 2 times. Complete this exercise 3 times per week.   STRENGTHENING - Abdominals, Crunches  Lie on a firm bed or floor. Keeping your legs in front of you, bend your knees so they are both pointed toward the ceiling and your feet are flat on the floor. Cross your arms over your chest. Slightly tip your chin down without bending your neck. Tense your abdominals and slowly lift your trunk high enough to just clear your shoulder blades. Lifting higher  can put excessive stress on the lower back and does not further strengthen your abdominal muscles. Control your return to the starting position. Repeat 2 times. Complete this exercise 3 times per week.   STRENGTHENING - Quadruped, Opposite UE/LE Lift  Assume a hands and knees position on a firm surface. Keep your hands under your shoulders and your knees under your hips. You may place padding under your knees for comfort. Find your neutral spine and gently tense your abdominal muscles so that you can maintain this position. Your shoulders and hips should form a rectangle that is parallel with the floor and is not twisted. Keeping your trunk steady, lift your right hand no higher than your shoulder and then your left leg no higher than your hip. Make sure you are not holding your breath. Hold this position for 30 seconds. Continuing to keep your abdominal muscles tense and your back steady, slowly return to your starting position. Repeat with the opposite arm and leg. Repeat 2 times. Complete this exercise 3 times per week.   STRENGTHENING - Abdominals and Quadriceps, Straight Leg Raise  Lie on a firm bed or floor with both legs extended in front of you. Keeping one leg in contact with the floor, bend the other knee so that your foot can rest flat on the floor. Find your neutral spine, and tense your abdominal muscles to maintain your spinal position throughout the exercise. Slowly lift your straight leg off the floor about 6 inches for a count of 3, making sure to not hold your breath. Still keeping your neutral spine, slowly lower your leg all the way to the floor. Repeat this exercise with each leg 2 times. Complete this exercise 3 times per week.  POSTURE AND BODY MECHANICS CONSIDERATIONS - Low Back Sprain Keeping correct posture when sitting, standing or completing your activities will reduce the stress put on different body tissues, allowing injured tissues a chance to heal and limiting  painful experiences. The following are general guidelines for improved posture.  While reading these guidelines, remember: The exercises prescribed by your provider will help you have the flexibility and strength to maintain correct postures. The correct posture provides the best environment for your joints to work. All of your joints have less wear and tear when properly supported by a spine with good posture. This means you will experience a healthier, less painful body. Correct posture must be practiced with all of your activities, especially prolonged sitting and standing. Correct posture is as important when doing repetitive low-stress activities (typing) as it is  when doing a single heavy-load activity (lifting).  RESTING POSITIONS Consider which positions are most painful for you when choosing a resting position. If you have pain with flexion-based activities (sitting, bending, stooping, squatting), choose a position that allows you to rest in a less flexed posture. You would want to avoid curling into a fetal position on your side. If your pain worsens with extension-based activities (prolonged standing, working overhead), avoid resting in an extended position such as sleeping on your stomach. Most people will find more comfort when they rest with their spine in a more neutral position, neither too rounded nor too arched. Lying on a non-sagging bed on your side with a pillow between your knees, or on your back with a pillow under your knees will often provide some relief. Keep in mind, being in any one position for a prolonged period of time, no matter how correct your posture, can still lead to stiffness.  PROPER SITTING POSTURE In order to minimize stress and discomfort on your spine, you must sit with correct posture. Sitting with good posture should be effortless for a healthy body. Returning to good posture is a gradual process. Many people can work toward this most comfortably by using various  supports until they have the flexibility and strength to maintain this posture on their own. When sitting with proper posture, your ears will fall over your shoulders and your shoulders will fall over your hips. You should use the back of the chair to support your upper back. Your lower back will be in a neutral position, just slightly arched. You may place a small pillow or folded towel at the base of your lower back for  support.  When working at a desk, create an environment that supports good, upright posture. Without extra support, muscles tire, which leads to excessive strain on joints and other tissues. Keep these recommendations in mind:  CHAIR: A chair should be able to slide under your desk when your back makes contact with the back of the chair. This allows you to work closely. The chair's height should allow your eyes to be level with the upper part of your monitor and your hands to be slightly lower than your elbows.  BODY POSITION Your feet should make contact with the floor. If this is not possible, use a foot rest. Keep your ears over your shoulders. This will reduce stress on your neck and low back.  INCORRECT SITTING POSTURES  If you are feeling tired and unable to assume a healthy sitting posture, do not slouch or slump. This puts excessive strain on your back tissues, causing more damage and pain. Healthier options include: Using more support, like a lumbar pillow. Switching tasks to something that requires you to be upright or walking. Talking a brief walk. Lying down to rest in a neutral-spine position.  PROLONGED STANDING WHILE SLIGHTLY LEANING FORWARD  When completing a task that requires you to lean forward while standing in one place for a long time, place either foot up on a stationary 2-4 inch high object to help maintain the best posture. When both feet are on the ground, the lower back tends to lose its slight inward curve. If this curve flattens (or becomes too  large), then the back and your other joints will experience too much stress, tire more quickly, and can cause pain.  CORRECT STANDING POSTURES Proper standing posture should be assumed with all daily activities, even if they only take a few moments, like when brushing  your teeth. As in sitting, your ears should fall over your shoulders and your shoulders should fall over your hips. You should keep a slight tension in your abdominal muscles to brace your spine. Your tailbone should point down to the ground, not behind your body, resulting in an over-extended swayback posture.   INCORRECT STANDING POSTURES  Common incorrect standing postures include a forward head, locked knees and/or an excessive swayback. WALKING Walk with an upright posture. Your ears, shoulders and hips should all line-up.  PROLONGED ACTIVITY IN A FLEXED POSITION When completing a task that requires you to bend forward at your waist or lean over a low surface, try to find a way to stabilize 3 out of 4 of your limbs. You can place a hand or elbow on your thigh or rest a knee on the surface you are reaching across. This will provide you more stability, so that your muscles do not tire as quickly. By keeping your knees relaxed, or slightly bent, you will also reduce stress across your lower back. CORRECT LIFTING TECHNIQUES  DO : Assume a wide stance. This will provide you more stability and the opportunity to get as close as possible to the object which you are lifting. Tense your abdominals to brace your spine. Bend at the knees and hips. Keeping your back locked in a neutral-spine position, lift using your leg muscles. Lift with your legs, keeping your back straight. Test the weight of unknown objects before attempting to lift them. Try to keep your elbows locked down at your sides in order get the best strength from your shoulders when carrying an object.   Always ask for help when lifting heavy or awkward objects. INCORRECT  LIFTING TECHNIQUES DO NOT:  Lock your knees when lifting, even if it is a small object. Bend and twist. Pivot at your feet or move your feet when needing to change directions. Assume that you can safely pick up even a paperclip without proper posture.

## 2021-03-23 NOTE — Progress Notes (Signed)
Chief Complaint  Patient presents with   Follow-up    1 month stress    Subjective: Patient is a 70 y.o. female here for f/u.  Bereavement We increased the dose of prazosin to 5 mg nightly.  Since that time, she reports doing better with the higher dosage. No AE's. Has been having anxiety at random times. Has lost 5 lbs as her appetite had decreased.   Benign essential tremor She been dealing with this issue for quite some time but restarted treatment 1 month ago with propranolol LA 60 mg daily.  She reports compliance without adverse effects.  Symptoms are improved in the hands, unchanged in the legs. Interested in increasing the dosage.   Continues to have chronic low back pain. Worse w certain movements. No recent inj or change in activity. No neuro s/s's.  Past Medical History:  Diagnosis Date   Anxiety    Benzodiazepine dependence (HCC)    Colonic polyp    mulitple polyps. repeat in 05/2019   Constipation    IBS   COPD (chronic obstructive pulmonary disease) (HCC)    Depression    GERD (gastroesophageal reflux disease)    Hyperlipidemia    Hypertension    Insomnia    Migraine headache    OAB (overactive bladder)    Osteoarthritis    Osteoporosis 12/19/2017   Post-surgical hypothyroidism    Pulmonary embolism (HCC)    Eliquis to stop 05/03/19   Scoliosis     Objective: BP 110/64   Pulse 75   Temp 98 F (36.7 C) (Oral)   Ht 4' 11.5" (1.511 m)   Wt 129 lb (58.5 kg)   SpO2 95%   BMI 25.62 kg/m  General: Awake, appears stated age HEENT: MMM, EOMi Heart: RRR, no LE edema Neuro: +Intention tremor b/l in all extremities, no resting tremor. Gait is slow Lungs: CTAB, no rales, wheezes or rhonchi. No accessory muscle use Psych: Age appropriate judgment and insight, normal affect and mood  Assessment and Plan: Bereavement  Benign essential tremor - Plan: propranolol ER (INDERAL LA) 80 MG 24 hr capsule  GAD (generalized anxiety disorder)  Chronic bilateral low  back pain without sciatica - Plan: diclofenac Sodium (VOLTAREN) 1 % GEL  Stable regarding sleep. Will cont Prazosin 5 mg/d. Chronic, uncontrolled. Increase propranolol LA to 80 mg/d from 60 mg/d.  Chronic, unstable. Add BuSpar 7.5 mg bid.  Chronic, unstable. Topical NSAID, Tylenol, ice, heat, stretches/exercises. F/u in 1 mo to reck 2-4.  The patient voiced understanding and agreement to the plan.  Jilda Roche [place], DO 03/23/21  12:14 PM

## 2021-04-07 DIAGNOSIS — F17210 Nicotine dependence, cigarettes, uncomplicated: Secondary | ICD-10-CM | POA: Diagnosis not present

## 2021-04-07 DIAGNOSIS — F39 Unspecified mood [affective] disorder: Secondary | ICD-10-CM | POA: Diagnosis not present

## 2021-04-07 DIAGNOSIS — E669 Obesity, unspecified: Secondary | ICD-10-CM | POA: Diagnosis not present

## 2021-04-07 DIAGNOSIS — J449 Chronic obstructive pulmonary disease, unspecified: Secondary | ICD-10-CM | POA: Diagnosis not present

## 2021-04-07 DIAGNOSIS — K589 Irritable bowel syndrome without diarrhea: Secondary | ICD-10-CM | POA: Diagnosis not present

## 2021-04-07 DIAGNOSIS — G4700 Insomnia, unspecified: Secondary | ICD-10-CM | POA: Diagnosis not present

## 2021-04-07 DIAGNOSIS — G250 Essential tremor: Secondary | ICD-10-CM | POA: Diagnosis not present

## 2021-04-07 DIAGNOSIS — Z7951 Long term (current) use of inhaled steroids: Secondary | ICD-10-CM | POA: Diagnosis not present

## 2021-04-07 DIAGNOSIS — Z7982 Long term (current) use of aspirin: Secondary | ICD-10-CM | POA: Diagnosis not present

## 2021-04-07 DIAGNOSIS — M1990 Unspecified osteoarthritis, unspecified site: Secondary | ICD-10-CM | POA: Diagnosis not present

## 2021-04-07 DIAGNOSIS — E890 Postprocedural hypothyroidism: Secondary | ICD-10-CM | POA: Diagnosis not present

## 2021-04-07 DIAGNOSIS — Z86010 Personal history of colonic polyps: Secondary | ICD-10-CM | POA: Diagnosis not present

## 2021-04-07 DIAGNOSIS — M810 Age-related osteoporosis without current pathological fracture: Secondary | ICD-10-CM | POA: Diagnosis not present

## 2021-04-07 DIAGNOSIS — G2581 Restless legs syndrome: Secondary | ICD-10-CM | POA: Diagnosis not present

## 2021-04-07 DIAGNOSIS — G43909 Migraine, unspecified, not intractable, without status migrainosus: Secondary | ICD-10-CM | POA: Diagnosis not present

## 2021-04-07 DIAGNOSIS — R911 Solitary pulmonary nodule: Secondary | ICD-10-CM | POA: Diagnosis not present

## 2021-04-07 DIAGNOSIS — F329 Major depressive disorder, single episode, unspecified: Secondary | ICD-10-CM | POA: Diagnosis not present

## 2021-04-07 DIAGNOSIS — E785 Hyperlipidemia, unspecified: Secondary | ICD-10-CM | POA: Diagnosis not present

## 2021-04-07 DIAGNOSIS — Z6826 Body mass index (BMI) 26.0-26.9, adult: Secondary | ICD-10-CM | POA: Diagnosis not present

## 2021-04-07 DIAGNOSIS — K219 Gastro-esophageal reflux disease without esophagitis: Secondary | ICD-10-CM | POA: Diagnosis not present

## 2021-04-07 DIAGNOSIS — N3281 Overactive bladder: Secondary | ICD-10-CM | POA: Diagnosis not present

## 2021-04-07 DIAGNOSIS — Z7902 Long term (current) use of antithrombotics/antiplatelets: Secondary | ICD-10-CM | POA: Diagnosis not present

## 2021-04-07 DIAGNOSIS — Z955 Presence of coronary angioplasty implant and graft: Secondary | ICD-10-CM | POA: Diagnosis not present

## 2021-04-07 DIAGNOSIS — F411 Generalized anxiety disorder: Secondary | ICD-10-CM | POA: Diagnosis not present

## 2021-04-07 DIAGNOSIS — I10 Essential (primary) hypertension: Secondary | ICD-10-CM | POA: Diagnosis not present

## 2021-04-15 ENCOUNTER — Other Ambulatory Visit: Payer: Self-pay

## 2021-04-15 ENCOUNTER — Other Ambulatory Visit: Payer: Self-pay | Admitting: Psychiatry

## 2021-04-15 ENCOUNTER — Other Ambulatory Visit: Payer: Self-pay | Admitting: Surgery

## 2021-04-15 ENCOUNTER — Other Ambulatory Visit: Payer: Self-pay | Admitting: Family Medicine

## 2021-04-15 DIAGNOSIS — F5101 Primary insomnia: Secondary | ICD-10-CM

## 2021-04-15 DIAGNOSIS — F411 Generalized anxiety disorder: Secondary | ICD-10-CM

## 2021-04-15 DIAGNOSIS — G250 Essential tremor: Secondary | ICD-10-CM

## 2021-04-15 DIAGNOSIS — F39 Unspecified mood [affective] disorder: Secondary | ICD-10-CM

## 2021-04-15 DIAGNOSIS — E890 Postprocedural hypothyroidism: Secondary | ICD-10-CM

## 2021-04-15 MED ORDER — CLOPIDOGREL BISULFATE 75 MG PO TABS: 75.0000 mg | ORAL_TABLET | Freq: Every day | ORAL | 0 refills | Status: DC

## 2021-04-15 NOTE — Telephone Encounter (Signed)
Please make appt for more refills

## 2021-04-20 ENCOUNTER — Other Ambulatory Visit: Payer: Self-pay | Admitting: Family Medicine

## 2021-04-20 ENCOUNTER — Other Ambulatory Visit: Payer: Self-pay | Admitting: Surgery

## 2021-04-27 ENCOUNTER — Encounter: Payer: Self-pay | Admitting: Family Medicine

## 2021-04-27 ENCOUNTER — Other Ambulatory Visit: Payer: Self-pay

## 2021-04-27 ENCOUNTER — Ambulatory Visit (INDEPENDENT_AMBULATORY_CARE_PROVIDER_SITE_OTHER): Payer: PPO | Admitting: Family Medicine

## 2021-04-27 VITALS — BP 118/68 | HR 75 | Temp 98.3°F | Ht 59.0 in | Wt 127.5 lb

## 2021-04-27 DIAGNOSIS — Z23 Encounter for immunization: Secondary | ICD-10-CM | POA: Diagnosis not present

## 2021-04-27 DIAGNOSIS — G250 Essential tremor: Secondary | ICD-10-CM | POA: Diagnosis not present

## 2021-04-27 DIAGNOSIS — G8929 Other chronic pain: Secondary | ICD-10-CM

## 2021-04-27 DIAGNOSIS — M545 Low back pain, unspecified: Secondary | ICD-10-CM | POA: Diagnosis not present

## 2021-04-27 DIAGNOSIS — F411 Generalized anxiety disorder: Secondary | ICD-10-CM

## 2021-04-27 DIAGNOSIS — M47817 Spondylosis without myelopathy or radiculopathy, lumbosacral region: Secondary | ICD-10-CM | POA: Insufficient documentation

## 2021-04-27 MED ORDER — NITROGLYCERIN 0.4 MG/HR TD PT24: 0.4000 mg | MEDICATED_PATCH | Freq: Every day | TRANSDERMAL | 12 refills | Status: DC

## 2021-04-27 MED ORDER — TRULICITY 1.5 MG/0.5ML ~~LOC~~ SOAJ: 1.5000 mg | SUBCUTANEOUS | 1 refills | Status: DC

## 2021-04-27 NOTE — Addendum Note (Signed)
Addended by: Scharlene Gloss B on: 04/27/2021 11:24 AM   Modules accepted: Orders

## 2021-04-27 NOTE — Progress Notes (Signed)
Chief Complaint  Patient presents with   Follow-up    Subjective Brenda Dunn presents for f/u anxiety/depression.  Pt is currently being treated with BuSpar 7.5 mg bid. She has actually been taking 15 mg/d.  Reports doing quite a bit better since treatment. No thoughts of harming self or others. No self-medication with alcohol, prescription drugs or illicit drugs. Pt is following with a counselor/psychologist.  Propranolol 80 mg/d is working well for her shaking. No AE's, reports compliance. Content w dosage.   LBP Could not get gel on back. On Plavix and multiple drugs affecting serotonin. Interested in something else.   Past Medical History:  Diagnosis Date   Anxiety    Benzodiazepine dependence (HCC)    Colonic polyp    mulitple polyps. repeat in 05/2019   Constipation    IBS   COPD (chronic obstructive pulmonary disease) (HCC)    Depression    GERD (gastroesophageal reflux disease)    Hyperlipidemia    Hypertension    Insomnia    Migraine headache    OAB (overactive bladder)    Osteoarthritis    Osteoporosis 12/19/2017   Post-surgical hypothyroidism    Pulmonary embolism (HCC)    Eliquis to stop 05/03/19   Scoliosis    Allergies as of 04/27/2021   No Known Allergies      Medication List        Accurate as of April 27, 2021 11:12 AM. If you have any questions, ask your nurse or doctor.          STOP taking these medications    diclofenac Sodium 1 % Gel Commonly known as: Voltaren Stopped by: Sharlene Dory, DO       TAKE these medications    acetaminophen 500 MG tablet Commonly known as: TYLENOL Take 1,000 mg by mouth every 6 (six) hours as needed for moderate pain.   albuterol 108 (90 Base) MCG/ACT inhaler Commonly known as: VENTOLIN HFA Inhale 2 puffs into the lungs every 4 (four) hours as needed for wheezing or shortness of breath.   alendronate 70 MG tablet Commonly known as: FOSAMAX TAKE 1 TABLET BY MOUTH EVERY WEEK WITH  FULL GLASS OF WATER ON EMPTY STOMACH   aspirin EC 81 MG tablet Take 1 tablet (81 mg total) by mouth daily.   atorvastatin 40 MG tablet Commonly known as: LIPITOR TAKE 1 TABLET BY MOUTH EVERYDAY AT BEDTIME   budesonide-formoterol 80-4.5 MCG/ACT inhaler Commonly known as: SYMBICORT TAKE 2 PUFFS BY MOUTH TWICE A DAY   busPIRone 7.5 MG tablet Commonly known as: BUSPAR TAKE 1 TABLET BY MOUTH 2 TIMES DAILY.   Calcium Carbonate-Vitamin D 600-400 MG-UNIT tablet Commonly known as: Caltrate 600+D Take 1 tablet by mouth 2 (two) times daily.   clopidogrel 75 MG tablet Commonly known as: PLAVIX TAKE 1 TABLET BY MOUTH EVERY DAY What changed: Another medication with the same name was removed. Continue taking this medication, and follow the directions you see here. Changed by: Sharlene Dory, DO   diazepam 5 MG tablet Commonly known as: VALIUM Take 1 tablet (5 mg total) by mouth in the morning, at noon, and at bedtime.   docusate sodium 100 MG capsule Commonly known as: COLACE Take 100-200 mg by mouth at bedtime.   esomeprazole 40 MG capsule Commonly known as: NEXIUM TAKE 1 CAPSULE BY MOUTH EVERY DAY   fenofibrate 48 MG tablet Commonly known as: TRICOR TAKE 1 TABLET BY MOUTH EVERY DAY   gabapentin 400 MG capsule  Commonly known as: NEURONTIN TAKE 1 CAPSULE BY MOUTH TWICE A DAY AND TAKE 4 CAPSULES AT BEDTIME   levocetirizine 5 MG tablet Commonly known as: XYZAL TAKE 1 TABLET BY MOUTH EVERY DAY IN THE EVENING   levothyroxine 112 MCG tablet Commonly known as: SYNTHROID TAKE 1 TABLET BY MOUTH EVERY DAY   Linzess 290 MCG Caps capsule Generic drug: linaclotide TAKE 1 CAPSULE EVERY DAY ON EMPTY STOMACH AT LEAST 30 MINUTES PRIOR TO 1ST MEAL OF THE DAY   lisinopril 40 MG tablet Commonly known as: ZESTRIL TAKE 1 TABLET BY MOUTH EVERY DAY   mirtazapine 45 MG tablet Commonly known as: REMERON TAKE 1 TABLET BY MOUTH AT BEDTIME.   montelukast 10 MG tablet Commonly known  as: SINGULAIR TAKE 1 TABLET BY MOUTH EVERYDAY AT BEDTIME   Myrbetriq 50 MG Tb24 tablet Generic drug: mirabegron ER TAKE 1 TABLET BY MOUTH EVERY DAY   nitroGLYCERIN 0.4 mg/hr patch Commonly known as: NITRODUR - Dosed in mg/24 hr Place 1 patch (0.4 mg total) onto the skin daily. Started by: Sharlene Dory, DO   pantoprazole 40 MG tablet Commonly known as: PROTONIX TAKE 1 TABLET BY MOUTH EVERY DAY   prazosin 5 MG capsule Commonly known as: MINIPRESS TAKE 1 CAPSULE BY MOUTH AT BEDTIME.   PROBIOTIC DAILY PO Take 2 tablets by mouth daily.   propranolol ER 80 MG 24 hr capsule Commonly known as: INDERAL LA TAKE 1 CAPSULE BY MOUTH EVERY DAY   Trulicity 1.5 MG/0.5ML Sopn Generic drug: Dulaglutide Inject 1.5 mg into the skin once a week. Started by: Sharlene Dory, DO        Exam BP 118/68   Pulse 75   Temp 98.3 F (36.8 C) (Oral)   Ht 4\' 11"  (1.499 m)   Wt 127 lb 8 oz (57.8 kg)   SpO2 96%   BMI 25.75 kg/m  General:  well developed, well nourished, in no apparent distress Heart: RRR Lungs:  CTAB. No respiratory distress MSK: TTP over lumbar parasp msc b/l.  Neuro: DTR's equal and symmetric b/l, no clonus; gait is better today but still cautious.  Psych: well oriented with normal range of affect and age-appropriate judgement/insight, alert and oriented x4.  Assessment and Plan  GAD (generalized anxiety disorder)  Benign essential tremor  Chronic bilateral low back pain without sciatica - Plan: nitroGLYCERIN (NITRODUR - DOSED IN MG/24 HR) 0.4 mg/hr patch, Ambulatory referral to Sports Medicine  Chronic, stable. Cont BuSpar 7.5 mg bid, Remeron 30 mg qhs. Chronic, stable. Cont propranolol LA 80 mg/d. Refer sports med. Trial topical nitro. HH PT.  F/u in 6 mo or prn. The patient voiced understanding and agreement to the plan.  [place], DO 04/27/21 11:12 AM

## 2021-04-27 NOTE — Patient Instructions (Signed)
Keep the diet clean and stay active.  If you do not hear anything about your referral in the next 1-2 weeks, call our office and ask for an update.  Let us know if you need anything.  

## 2021-04-28 ENCOUNTER — Telehealth: Payer: Self-pay | Admitting: Family Medicine

## 2021-04-28 NOTE — Telephone Encounter (Signed)
Patient is wondering why she was given the nitroGLYCERIN (NITRODUR - DOSED IN MG/24 HR) 0.4 mg/hr patch medication, since she is already on bp medicine. She states her husband used to be on that medication, and it was for his bp. She would like someone to call her back and clarify if this medicine is in fact for bp or something else. Please advice.

## 2021-04-28 NOTE — Telephone Encounter (Signed)
Called informed the patient of PCP instructions. She did verbalize understanding. 

## 2021-04-28 NOTE — Telephone Encounter (Signed)
pateitn

## 2021-04-28 NOTE — Telephone Encounter (Signed)
This is the topical that increases local blood flow to the back that we are trying. Not for heart issues. Ty.

## 2021-05-03 ENCOUNTER — Telehealth: Payer: Self-pay | Admitting: Family Medicine

## 2021-05-03 DIAGNOSIS — G4700 Insomnia, unspecified: Secondary | ICD-10-CM | POA: Diagnosis not present

## 2021-05-03 DIAGNOSIS — I10 Essential (primary) hypertension: Secondary | ICD-10-CM | POA: Diagnosis not present

## 2021-05-03 DIAGNOSIS — G2581 Restless legs syndrome: Secondary | ICD-10-CM | POA: Diagnosis not present

## 2021-05-03 DIAGNOSIS — E669 Obesity, unspecified: Secondary | ICD-10-CM | POA: Diagnosis not present

## 2021-05-03 DIAGNOSIS — K589 Irritable bowel syndrome without diarrhea: Secondary | ICD-10-CM | POA: Diagnosis not present

## 2021-05-03 DIAGNOSIS — Z6826 Body mass index (BMI) 26.0-26.9, adult: Secondary | ICD-10-CM | POA: Diagnosis not present

## 2021-05-03 DIAGNOSIS — K219 Gastro-esophageal reflux disease without esophagitis: Secondary | ICD-10-CM | POA: Diagnosis not present

## 2021-05-03 DIAGNOSIS — R911 Solitary pulmonary nodule: Secondary | ICD-10-CM | POA: Diagnosis not present

## 2021-05-03 DIAGNOSIS — Z7902 Long term (current) use of antithrombotics/antiplatelets: Secondary | ICD-10-CM | POA: Diagnosis not present

## 2021-05-03 DIAGNOSIS — Z955 Presence of coronary angioplasty implant and graft: Secondary | ICD-10-CM | POA: Diagnosis not present

## 2021-05-03 DIAGNOSIS — M810 Age-related osteoporosis without current pathological fracture: Secondary | ICD-10-CM | POA: Diagnosis not present

## 2021-05-03 DIAGNOSIS — E890 Postprocedural hypothyroidism: Secondary | ICD-10-CM | POA: Diagnosis not present

## 2021-05-03 DIAGNOSIS — F411 Generalized anxiety disorder: Secondary | ICD-10-CM | POA: Diagnosis not present

## 2021-05-03 DIAGNOSIS — Z7951 Long term (current) use of inhaled steroids: Secondary | ICD-10-CM | POA: Diagnosis not present

## 2021-05-03 DIAGNOSIS — Z86010 Personal history of colonic polyps: Secondary | ICD-10-CM | POA: Diagnosis not present

## 2021-05-03 DIAGNOSIS — F39 Unspecified mood [affective] disorder: Secondary | ICD-10-CM | POA: Diagnosis not present

## 2021-05-03 DIAGNOSIS — G43909 Migraine, unspecified, not intractable, without status migrainosus: Secondary | ICD-10-CM | POA: Diagnosis not present

## 2021-05-03 DIAGNOSIS — M1990 Unspecified osteoarthritis, unspecified site: Secondary | ICD-10-CM | POA: Diagnosis not present

## 2021-05-03 DIAGNOSIS — G250 Essential tremor: Secondary | ICD-10-CM | POA: Diagnosis not present

## 2021-05-03 DIAGNOSIS — J449 Chronic obstructive pulmonary disease, unspecified: Secondary | ICD-10-CM | POA: Diagnosis not present

## 2021-05-03 DIAGNOSIS — F17210 Nicotine dependence, cigarettes, uncomplicated: Secondary | ICD-10-CM | POA: Diagnosis not present

## 2021-05-03 DIAGNOSIS — G8929 Other chronic pain: Secondary | ICD-10-CM

## 2021-05-03 DIAGNOSIS — N3281 Overactive bladder: Secondary | ICD-10-CM | POA: Diagnosis not present

## 2021-05-03 DIAGNOSIS — F329 Major depressive disorder, single episode, unspecified: Secondary | ICD-10-CM | POA: Diagnosis not present

## 2021-05-03 DIAGNOSIS — E785 Hyperlipidemia, unspecified: Secondary | ICD-10-CM | POA: Diagnosis not present

## 2021-05-03 DIAGNOSIS — Z7982 Long term (current) use of aspirin: Secondary | ICD-10-CM | POA: Diagnosis not present

## 2021-05-03 NOTE — Telephone Encounter (Signed)
Riza from [facility] called [place] to let us know that the patient no longer qualifies for at home health. She states that she spoke to patient and patient agreed to go to outpatient physical therapy. She stated she would like to go to [facility] outpatient rehab in [place], their fax number is [phone]. Please advice.

## 2021-05-04 NOTE — Telephone Encounter (Signed)
OK to place order there. Ty.

## 2021-05-04 NOTE — Telephone Encounter (Signed)
Do you want me to use Physical deconditioning or the back pain dx for the referral

## 2021-05-05 NOTE — Telephone Encounter (Signed)
Referral placed to Rehab

## 2021-05-05 NOTE — Telephone Encounter (Signed)
Sent to [place] by mistake ,   Please see below , question about dx

## 2021-05-17 ENCOUNTER — Ambulatory Visit: Payer: PPO | Admitting: Family Medicine

## 2021-05-17 ENCOUNTER — Other Ambulatory Visit: Payer: Self-pay | Admitting: Family Medicine

## 2021-05-17 DIAGNOSIS — Z1231 Encounter for screening mammogram for malignant neoplasm of breast: Secondary | ICD-10-CM

## 2021-05-26 ENCOUNTER — Ambulatory Visit: Payer: PPO

## 2021-05-31 ENCOUNTER — Ambulatory Visit: Payer: PPO | Admitting: Rehabilitative and Restorative Service Providers"

## 2021-06-02 ENCOUNTER — Ambulatory Visit (INDEPENDENT_AMBULATORY_CARE_PROVIDER_SITE_OTHER): Payer: PPO | Admitting: Psychiatry

## 2021-06-02 ENCOUNTER — Encounter: Payer: Self-pay | Admitting: Psychiatry

## 2021-06-02 DIAGNOSIS — F411 Generalized anxiety disorder: Secondary | ICD-10-CM | POA: Diagnosis not present

## 2021-06-02 DIAGNOSIS — F5101 Primary insomnia: Secondary | ICD-10-CM | POA: Diagnosis not present

## 2021-06-02 DIAGNOSIS — F39 Unspecified mood [affective] disorder: Secondary | ICD-10-CM

## 2021-06-02 MED ORDER — MIRTAZAPINE 45 MG PO TABS: 45.0000 mg | ORAL_TABLET | Freq: Every day | ORAL | 1 refills | Status: DC

## 2021-06-02 MED ORDER — GABAPENTIN 400 MG PO CAPS: ORAL_CAPSULE | ORAL | 1 refills | Status: DC

## 2021-06-02 MED ORDER — DIAZEPAM 5 MG PO TABS: 5.0000 mg | ORAL_TABLET | Freq: Three times a day (TID) | ORAL | 5 refills | Status: DC

## 2021-06-02 NOTE — Progress Notes (Signed)
Brenda Dunn [id number] 07-Nov-1950 70 y.o.  Virtual Visit via Telephone Note  I connected with pt on 06/02/21 at 10:30 AM EDT by telephone and verified that I am speaking with the /correct person using two identifiers.  / I discussed the limitations, risks, security and privacy concerns of performing an evaluation and management service by telephone and the availability of in person appointments. I also discussed with the patient that there may be a patient responsible charge related to this service. The patient expressed understanding and agreed to proceed.   I discussed the assessment and treatment plan with the patient. The patient was provided an opportunity to ask questions and all were answered. The patient agreed with the plan and demonstrated an understanding of the instructions.   The patient was advised to call back or seek an in-person evaluation if the symptoms worsen or if the condition fails to improve as anticipated.  I provided 15 minutes of non-face-to-face time during this encounter.  The patient was located at home.  The provider was located at [facility].   Brenda Dunn, PMHNP   Subjective:   Patient ID:  Brenda Dunn is a 70 y.o. (DOB Apr 08, 1951) female.  Chief Complaint:  Chief Complaint  Patient presents with   Follow-up    Anxiety, depression    HPI Brenda Dunn presents for follow-up of anxiety, depression, and insomnia. She reports, "I'm doing great... all the way around." She reports that she was having "butterflies in my stomach" and PCP started her on Buspar and this helped. Denies any panic or anxiety. Denies depressed mood. She reports that her sleep has improved with addition of Prazosin and reports multiple awakenings prior to start of Prazosin. She reports that she is now sleeping 8-9 hours a night. Energy and motivation have been "great." Concentration has been consistent with baseline. Denies SI.   Continues to live alone.   Review of  Systems:  Review of Systems  Respiratory: Negative.    Musculoskeletal:  Negative for gait problem.  Neurological:  Negative for tremors.  Psychiatric/Behavioral:         Please refer to HPI   Medications: I have reviewed the patient's current medications.  Current Outpatient Medications  Medication Sig Dispense Refill   acetaminophen (TYLENOL) 500 MG tablet Take 1,000 mg by mouth every 6 (six) hours as needed for moderate pain.     albuterol (VENTOLIN HFA) 108 (90 Base) MCG/ACT inhaler Inhale 2 puffs into the lungs every 4 (four) hours as needed for wheezing or shortness of breath. 2 each 3   busPIRone (BUSPAR) 7.5 MG tablet TAKE 1 TABLET BY MOUTH 2 TIMES DAILY. 180 tablet 1   Calcium Carbonate-Vitamin D (CALTRATE 600+D) 600-400 MG-UNIT tablet Take 1 tablet by mouth 2 (two) times daily. 60 tablet 11   clopidogrel (PLAVIX) 75 MG tablet TAKE 1 TABLET BY MOUTH EVERY DAY 30 tablet 0   docusate sodium (COLACE) 100 MG capsule Take 100-200 mg by mouth at bedtime.     esomeprazole (NEXIUM) 40 MG capsule TAKE 1 CAPSULE BY MOUTH EVERY DAY 90 capsule 1   fenofibrate (TRICOR) 48 MG tablet TAKE 1 TABLET BY MOUTH EVERY DAY 90 tablet 1   levothyroxine (SYNTHROID) 112 MCG tablet TAKE 1 TABLET BY MOUTH EVERY DAY 90 tablet 1   LINZESS 290 MCG CAPS capsule TAKE 1 CAPSULE EVERY DAY ON EMPTY STOMACH AT LEAST 30 MINUTES PRIOR TO 1ST MEAL OF THE DAY 90 capsule 1   lisinopril (ZESTRIL) 40  MG tablet TAKE 1 TABLET BY MOUTH EVERY DAY 90 tablet 1   MYRBETRIQ 50 MG TB24 tablet TAKE 1 TABLET BY MOUTH EVERY DAY 90 tablet 1   nitroGLYCERIN (NITRODUR - DOSED IN MG/24 HR) 0.4 mg/hr patch Place 1 patch (0.4 mg total) onto the skin daily. 30 patch 12   pantoprazole (PROTONIX) 40 MG tablet TAKE 1 TABLET BY MOUTH EVERY DAY 90 tablet 1   prazosin (MINIPRESS) 5 MG capsule TAKE 1 CAPSULE BY MOUTH AT BEDTIME. 90 capsule 1   propranolol ER (INDERAL LA) 80 MG 24 hr capsule TAKE 1 CAPSULE BY MOUTH EVERY DAY 90 capsule 1    alendronate (FOSAMAX) 70 MG tablet TAKE 1 TABLET BY MOUTH EVERY WEEK WITH FULL GLASS OF WATER ON EMPTY STOMACH 12 tablet 3   aspirin EC 81 MG tablet Take 1 tablet (81 mg total) by mouth daily. 150 tablet 2   atorvastatin (LIPITOR) 40 MG tablet TAKE 1 TABLET BY MOUTH EVERYDAY AT BEDTIME 90 tablet 1   budesonide-formoterol (SYMBICORT) 80-4.5 MCG/ACT inhaler TAKE 2 PUFFS BY MOUTH TWICE A DAY 30.6 g 1   diazepam (VALIUM) 5 MG tablet Take 1 tablet (5 mg total) by mouth in the morning, at noon, and at bedtime. 90 tablet 5   gabapentin (NEURONTIN) 400 MG capsule TAKE 1 CAPSULE BY MOUTH TWICE A DAY AND TAKE 4 CAPSULES AT BEDTIME 540 capsule 1   levocetirizine (XYZAL) 5 MG tablet TAKE 1 TABLET BY MOUTH EVERY DAY IN THE EVENING (Patient not taking: Reported on 06/02/2021) 90 tablet 1   mirtazapine (REMERON) 45 MG tablet Take 1 tablet (45 mg total) by mouth at bedtime. 90 tablet 1   montelukast (SINGULAIR) 10 MG tablet TAKE 1 TABLET BY MOUTH EVERYDAY AT BEDTIME (Patient not taking: Reported on 06/02/2021) 90 tablet 2   Probiotic Product (PROBIOTIC DAILY PO) Take 2 tablets by mouth daily. (Patient not taking: Reported on 06/02/2021)     No current facility-administered medications for this visit.    Medication Side Effects: None  Allergies: No Known Allergies  Past Medical History:  Diagnosis Date   Anxiety    Benzodiazepine dependence (HCC)    Colonic polyp    mulitple polyps. repeat in 05/2019   Constipation    IBS   COPD (chronic obstructive pulmonary disease) (HCC)    Depression    GERD (gastroesophageal reflux disease)    Hyperlipidemia    Hypertension    Insomnia    Migraine headache    OAB (overactive bladder)    Osteoarthritis    Osteoporosis 12/19/2017   Post-surgical hypothyroidism    Pulmonary embolism (HCC)    Eliquis to stop 05/03/19   Scoliosis     Family History  Problem Relation Age of Onset   Breast cancer Mother    Arthritis Mother    Hearing loss Mother     Hyperlipidemia Mother    Hypertension Mother    Anxiety disorder Mother    Lung cancer Maternal Uncle    Anxiety disorder Maternal Uncle    Lung cancer Maternal Grandmother    Heart attack Maternal Grandmother    Anxiety disorder Maternal Grandmother    Asthma Father    COPD Father    Hyperlipidemia Father    Hypertension Father    Alcohol abuse Father    Anxiety disorder Sister    Hypertension Sister    Thyroid cancer Sister    Anxiety disorder Brother    Hypertension Brother    Drug abuse Son  Anxiety disorder Sister    Hypertension Sister    Anxiety disorder Sister    Hypertension Sister    Hyperlipidemia Son     Social History   Socioeconomic History   Marital status: Married    Spouse name: Not on file   Number of children: Not on file   Years of education: Not on file   Highest education level: Not on file  Occupational History   Occupation: disabled due "my nerves"  Tobacco Use   Smoking status: Every Day    Packs/day: 1.00    Years: 44.00    Pack years: 44.00    Types: Cigarettes   Smokeless tobacco: Never  Vaping Use   Vaping Use: Former  Substance and Sexual Activity   Alcohol use: No   Drug use: No   Sexual activity: Not on file  Other Topics Concern   Not on file  Social History Narrative   Not on file   Social Determinants of Health   Financial Resource Strain: Not on file  Food Insecurity: Not on file  Transportation Needs: Not on file  Physical Activity: Not on file  Stress: Not on file  Social Connections: Not on file  Intimate Partner Violence: Not on file    Past Medical History, Surgical history, Social history, and Family history were reviewed and updated as appropriate.   Please see review of systems for further details on the patient's review from today.   Objective:   Physical Exam:  There were no vitals taken for this visit.  Physical Exam Neurological:     Mental Status: She is alert and oriented to person, place,  and time.     Cranial Nerves: No dysarthria.  Psychiatric:        Attention and Perception: Attention and perception normal.        Mood and Affect: Mood normal.        Speech: Speech normal.        Behavior: Behavior is cooperative.        Thought Content: Thought content normal. Thought content is not paranoid or delusional. Thought content does not include homicidal or suicidal ideation. Thought content does not include homicidal or suicidal plan.        Cognition and Memory: Cognition and memory normal.        Judgment: Judgment normal.     Comments: Insight intact    Lab Review:     Component Value Date/Time   NA 139 02/16/2021 1021   NA 140 10/02/2017 1700   K 3.6 02/16/2021 1021   CL 103 02/16/2021 1021   CO2 25 02/16/2021 1021   GLUCOSE 89 02/16/2021 1021   BUN 10 02/16/2021 1021   BUN 11 10/02/2017 1700   CREATININE 0.79 02/16/2021 1021   CREATININE 0.98 05/18/2017 1426   CALCIUM 10.4 02/16/2021 1021   PROT 6.9 02/16/2021 1021   ALBUMIN 4.6 02/16/2021 1021   AST 12 02/16/2021 1021   ALT 8 02/16/2021 1021   ALKPHOS 79 02/16/2021 1021   BILITOT 0.4 02/16/2021 1021   GFRNONAA 56 (L) 04/13/2019 0915   GFRNONAA 88 05/10/2017 1023   GFRAA >60 04/13/2019 0915   GFRAA 102 05/10/2017 1023       Component Value Date/Time   WBC 5.8 02/16/2021 1021   RBC 4.15 02/16/2021 1021   HGB 13.3 02/16/2021 1021   HGB 11.6 08/28/2017 1142   HCT 39.1 02/16/2021 1021   HCT 33.2 (L) 08/28/2017 1142   PLT 190.0  02/16/2021 1021   PLT 222 08/28/2017 1142   MCV 94.3 02/16/2021 1021   MCV 88 08/28/2017 1142   MCH 32.4 04/13/2019 0915   MCHC 34.0 02/16/2021 1021   RDW 13.3 02/16/2021 1021   RDW 13.4 08/28/2017 1142   LYMPHSABS 1.7 04/13/2019 0915   LYMPHSABS 1.7 08/28/2017 1142   MONOABS 0.4 04/13/2019 0915   EOSABS 0.5 04/13/2019 0915   EOSABS 0.1 08/28/2017 1142   BASOSABS 0.0 04/13/2019 0915   BASOSABS 0.0 08/28/2017 1142    No results found for: POCLITH, LITHIUM   No  results found for: PHENYTOIN, PHENOBARB, VALPROATE, CBMZ   .res Assessment: Plan:   Will continue current plan of care since target signs and symptoms are well controlled without any tolerability issues. Continue Remeron 45 mg po QHS for depression and insomnia.  Continue Diazepam 5 mg po TID for anxiety.  Continue Gabapentin 400 mg po BID and 1600 mg po QHS for anxiety and insomnia.  Pt to follow-up in 5-6 months or sooner if clinically indicated.  Patient advised to contact office with any questions, adverse effects, or acute worsening in signs and symptoms.  Kairee was seen today for follow-up.  Diagnoses and all orders for this visit:  Primary insomnia -     diazepam (VALIUM) 5 MG tablet; Take 1 tablet (5 mg total) by mouth in the morning, at noon, and at bedtime. -     gabapentin (NEURONTIN) 400 MG capsule; TAKE 1 CAPSULE BY MOUTH TWICE A DAY AND TAKE 4 CAPSULES AT BEDTIME -     mirtazapine (REMERON) 45 MG tablet; Take 1 tablet (45 mg total) by mouth at bedtime.  Generalized anxiety disorder -     diazepam (VALIUM) 5 MG tablet; Take 1 tablet (5 mg total) by mouth in the morning, at noon, and at bedtime. -     gabapentin (NEURONTIN) 400 MG capsule; TAKE 1 CAPSULE BY MOUTH TWICE A DAY AND TAKE 4 CAPSULES AT BEDTIME -     mirtazapine (REMERON) 45 MG tablet; Take 1 tablet (45 mg total) by mouth at bedtime.  Episodic mood disorder (HCC) -     mirtazapine (REMERON) 45 MG tablet; Take 1 tablet (45 mg total) by mouth at bedtime.   Please see After Visit Summary for patient specific instructions.  Future Appointments  Date Time Provider Department Center  10/25/2021 11:00 AM Sharlene Dory, [place] [facility]  11/01/2021 10:00 AM Brenda Dunn, PMHNP [facility] None    No orders of the defined types were placed in this encounter.     -------------------------------

## 2021-06-16 ENCOUNTER — Ambulatory Visit (HOSPITAL_BASED_OUTPATIENT_CLINIC_OR_DEPARTMENT_OTHER)
Admission: RE | Admit: 2021-06-16 | Discharge: 2021-06-16 | Disposition: A | Discharge status: Home / Self Care | Payer: PPO | Source: Ambulatory Visit | Attending: Family Medicine | Admitting: Family Medicine

## 2021-06-16 ENCOUNTER — Encounter: Payer: Self-pay | Admitting: Family Medicine

## 2021-06-16 ENCOUNTER — Ambulatory Visit: Payer: PPO | Admitting: Family Medicine

## 2021-06-16 ENCOUNTER — Other Ambulatory Visit: Payer: Self-pay

## 2021-06-16 VITALS — BP 120/80 | Ht 59.0 in | Wt 127.0 lb

## 2021-06-16 DIAGNOSIS — M47817 Spondylosis without myelopathy or radiculopathy, lumbosacral region: Secondary | ICD-10-CM | POA: Diagnosis not present

## 2021-06-16 DIAGNOSIS — E559 Vitamin D deficiency, unspecified: Secondary | ICD-10-CM | POA: Diagnosis not present

## 2021-06-16 DIAGNOSIS — M47816 Spondylosis without myelopathy or radiculopathy, lumbar region: Secondary | ICD-10-CM | POA: Diagnosis not present

## 2021-06-16 DIAGNOSIS — M545 Low back pain, unspecified: Secondary | ICD-10-CM | POA: Diagnosis not present

## 2021-06-16 MED ORDER — DICLOFENAC SODIUM 1 % EX GEL: 4.0000 g | Freq: Four times a day (QID) | CUTANEOUS | 11 refills | Status: DC

## 2021-06-16 MED ORDER — PREDNISONE 5 MG PO TABS: ORAL_TABLET | ORAL | 0 refills | Status: DC

## 2021-06-16 NOTE — Progress Notes (Signed)
Brenda Dunn - 70 y.o. female MRN [id number]  Date of birth: 1950-09-20  SUBJECTIVE:  Including CC & ROS.  No chief complaint on file.   Brenda Dunn is a 70 y.o. female that is  presenting with low back pain. This is acute on chronic. She has pain with any movement. Pain is localized. No injury. No radicular pain. Has been referred to physical therapy but yet to start.   Review of Systems See HPI   HISTORY: Past Medical, Surgical, Social, and Family History Reviewed & Updated per EMR.   Pertinent Historical Findings include:  Past Medical History:  Diagnosis Date   Anxiety    Benzodiazepine dependence (HCC)    Colonic polyp    mulitple polyps. repeat in 05/2019   Constipation    IBS   COPD (chronic obstructive pulmonary disease) (HCC)    Depression    GERD (gastroesophageal reflux disease)    Hyperlipidemia    Hypertension    Insomnia    Migraine headache    OAB (overactive bladder)    Osteoarthritis    Osteoporosis 12/19/2017   Post-surgical hypothyroidism    Pulmonary embolism (HCC)    Eliquis to stop 05/03/19   Scoliosis     Past Surgical History:  Procedure Laterality Date   ABDOMINAL AORTOGRAM W/LOWER EXTREMITY Bilateral 03/24/2020   Procedure: ABDOMINAL AORTOGRAM W/LOWER EXTREMITY;  Surgeon: Nada Libman, MD;  Location: [facility];  Service: Cardiovascular;  Laterality: Bilateral;   BREAST CYST ASPIRATION Left    HEMORRHOID SURGERY     PERIPHERAL VASCULAR INTERVENTION Bilateral 03/24/2020   Procedure: PERIPHERAL VASCULAR INTERVENTION;  Surgeon: Nada Libman, MD;  Location: [facility];  Service: Cardiovascular;  Laterality: Bilateral;  ILIAC STENTS   THYROIDECTOMY     at [facility]   TUBAL LIGATION  1979    Family History  Problem Relation Age of Onset   Breast cancer Mother    Arthritis Mother    Hearing loss Mother    Hyperlipidemia Mother    Hypertension Mother    Anxiety disorder Mother    Lung cancer Maternal Uncle    Anxiety  disorder Maternal Uncle    Lung cancer Maternal Grandmother    Heart attack Maternal Grandmother    Anxiety disorder Maternal Grandmother    Asthma Father    COPD Father    Hyperlipidemia Father    Hypertension Father    Alcohol abuse Father    Anxiety disorder Sister    Hypertension Sister    Thyroid cancer Sister    Anxiety disorder Brother    Hypertension Brother    Drug abuse Son    Anxiety disorder Sister    Hypertension Sister    Anxiety disorder Sister    Hypertension Sister    Hyperlipidemia Son     Social History   Socioeconomic History   Marital status: Married    Spouse name: Not on file   Number of children: Not on file   Years of education: Not on file   Highest education level: Not on file  Occupational History   Occupation: disabled due "my nerves"  Tobacco Use   Smoking status: Every Day    Packs/day: 1.00    Years: 44.00    Pack years: 44.00    Types: Cigarettes   Smokeless tobacco: Never  Vaping Use   Vaping Use: Former  Substance and Sexual Activity   Alcohol use: No   Drug use: No   Sexual activity:  Not on file  Other Topics Concern   Not on file  Social History Narrative   Not on file   Social Determinants of Health   Financial Resource Strain: Not on file  Food Insecurity: Not on file  Transportation Needs: Not on file  Physical Activity: Not on file  Stress: Not on file  Social Connections: Not on file  Intimate Partner Violence: Not on file     PHYSICAL EXAM:  VS: BP 120/80 (BP Location: Left Arm, Patient Position: Sitting)   Ht 4\' 11"  (1.499 m)   Wt 127 lb (57.6 kg)   BMI 25.65 kg/m  Physical Exam Gen: NAD, alert, cooperative with exam, well-appearing    ASSESSMENT & PLAN:   Facet arthritis of lumbosacral region Symptoms likely associated with degenerative changes of the facet joints.  Has instability and weakness on exam that is contributing -Counseled on home exercise therapy and supportive care  and -X-ray. -Prednisone. -Counseled on starting physical therapy. -Voltaren. -Could consider SI joint injection or further imaging.  Vitamin D deficiency Check vitamin D

## 2021-06-16 NOTE — Assessment & Plan Note (Signed)
Symptoms likely associated with degenerative changes of the facet joints.  Has instability and weakness on exam that is contributing -Counseled on home exercise therapy and supportive care and -X-ray. -Prednisone. -Counseled on starting physical therapy. -Voltaren. -Could consider SI joint injection or further imaging.

## 2021-06-16 NOTE — Patient Instructions (Signed)
Nice to meet you Please try heat  Please try physical therapy. [phone] Please try the prednisone. You can use the voltaren after the prednisone.  I will call with the results from today  You bone density is up to date Please send me a message in MyChart with any questions or updates.  Please see me back in 4 weeks.   --Dr. Jordan Likes

## 2021-06-16 NOTE — Assessment & Plan Note (Signed)
Check vitamin D. 

## 2021-06-17 LAB — VITAMIN D 25 HYDROXY (VIT D DEFICIENCY, FRACTURES): Vit D, 25-Hydroxy: 35.9 ng/mL (ref 30.0–100.0)

## 2021-06-18 ENCOUNTER — Telehealth: Payer: Self-pay | Admitting: Family Medicine

## 2021-06-18 MED ORDER — VITAMIN D (ERGOCALCIFEROL) 1.25 MG (50000 UNIT) PO CAPS: 50000.0000 [IU] | ORAL_CAPSULE | ORAL | 0 refills | Status: DC

## 2021-06-18 NOTE — Telephone Encounter (Signed)
Left message for patient to call back and schedule Medicare Annual Wellness Visit (AWV) in office.   If not able to come in office, please offer to do virtually or by telephone.  Left office number and my jabber #[phone].  Last AWV:02/13/2020  Please schedule at anytime with Nurse Health Advisor.   

## 2021-06-18 NOTE — Telephone Encounter (Signed)
Left VM for patient. If she calls back please have her speak with a nurse/CMA and inform that her lumbar spine x-ray shows facet degenerative changes as well as degenerative change of the disc.  Her vitamin D is on the low end of normal so I will try to get this more elevated with a prescription..   If any questions then please take the best time and phone number to call and I will try to call her back.   Myra Rude, MD [facility] 06/18/2021, 9:05 AM

## 2021-06-21 ENCOUNTER — Ambulatory Visit: Payer: PPO | Admitting: Rehabilitative and Restorative Service Providers"

## 2021-06-28 ENCOUNTER — Other Ambulatory Visit: Payer: Self-pay | Admitting: Psychiatry

## 2021-06-28 DIAGNOSIS — F39 Unspecified mood [affective] disorder: Secondary | ICD-10-CM

## 2021-06-29 NOTE — Telephone Encounter (Signed)
LVM to RC to see if she is taking the Risperdal. It looks like her PCP stopped it, but there are several refill requests about dosing.

## 2021-07-09 ENCOUNTER — Other Ambulatory Visit: Payer: Self-pay | Admitting: Family Medicine

## 2021-07-13 ENCOUNTER — Ambulatory Visit: Payer: PPO | Admitting: Family Medicine

## 2021-07-20 ENCOUNTER — Other Ambulatory Visit: Payer: Self-pay | Admitting: Family Medicine

## 2021-07-20 ENCOUNTER — Telehealth: Payer: Self-pay | Admitting: Family Medicine

## 2021-07-20 NOTE — Telephone Encounter (Signed)
She would like the 1MG  please.  Medication: prazosin (MINIPRESS) 1 MG capsule  Has the patient contacted their pharmacy? Yes.   (If no, request that the patient contact the pharmacy for the refill.) (If yes, when and what did the pharmacy advise?)  Preferred Pharmacy (with phone number or street name):  [phone] - [place], [place] - [address]  [address] [place], [place]  Phone:  [phone]  Fax:  [phone]  Agent: Please be advised that RX refills may take up to 3 business days. We ask that you follow-up with your pharmacy.

## 2021-07-20 NOTE — Telephone Encounter (Signed)
Is not on current list

## 2021-07-20 NOTE — Telephone Encounter (Signed)
She wants to stay on the 5 mg once daily and take 2 of the 1 mg at night (for sleep). She stated that this was discussed at her last OV and PCP said ok to do.

## 2021-07-21 MED ORDER — PRAZOSIN HCL 1 MG PO CAPS: 2.0000 mg | ORAL_CAPSULE | Freq: Every day | ORAL | 3 refills | Status: DC

## 2021-07-21 NOTE — Telephone Encounter (Signed)
Sent in and patient informed 

## 2021-07-28 ENCOUNTER — Other Ambulatory Visit: Payer: Self-pay | Admitting: Family Medicine

## 2021-07-28 DIAGNOSIS — F411 Generalized anxiety disorder: Secondary | ICD-10-CM

## 2021-07-28 DIAGNOSIS — Z634 Disappearance and death of family member: Secondary | ICD-10-CM

## 2021-08-15 ENCOUNTER — Other Ambulatory Visit: Payer: Self-pay | Admitting: Family Medicine

## 2021-08-17 ENCOUNTER — Other Ambulatory Visit: Payer: Self-pay | Admitting: Family Medicine

## 2021-09-09 ENCOUNTER — Other Ambulatory Visit: Payer: Self-pay | Admitting: Family Medicine

## 2021-09-09 DIAGNOSIS — J302 Other seasonal allergic rhinitis: Secondary | ICD-10-CM

## 2021-10-22 ENCOUNTER — Other Ambulatory Visit: Payer: Self-pay | Admitting: Family Medicine

## 2021-10-25 ENCOUNTER — Encounter: Payer: Self-pay | Admitting: Family Medicine

## 2021-10-25 ENCOUNTER — Ambulatory Visit (INDEPENDENT_AMBULATORY_CARE_PROVIDER_SITE_OTHER): Payer: PPO | Admitting: Family Medicine

## 2021-10-25 VITALS — BP 108/68 | HR 77 | Temp 98.4°F | Resp 16 | Ht 59.0 in | Wt 128.8 lb

## 2021-10-25 DIAGNOSIS — N3281 Overactive bladder: Secondary | ICD-10-CM | POA: Diagnosis not present

## 2021-10-25 DIAGNOSIS — F411 Generalized anxiety disorder: Secondary | ICD-10-CM | POA: Diagnosis not present

## 2021-10-25 DIAGNOSIS — G250 Essential tremor: Secondary | ICD-10-CM | POA: Diagnosis not present

## 2021-10-25 DIAGNOSIS — R739 Hyperglycemia, unspecified: Secondary | ICD-10-CM | POA: Diagnosis not present

## 2021-10-25 DIAGNOSIS — E785 Hyperlipidemia, unspecified: Secondary | ICD-10-CM

## 2021-10-25 DIAGNOSIS — E890 Postprocedural hypothyroidism: Secondary | ICD-10-CM

## 2021-10-25 DIAGNOSIS — Z634 Disappearance and death of family member: Secondary | ICD-10-CM

## 2021-10-25 LAB — COMPREHENSIVE METABOLIC PANEL
ALT: 7 U/L (ref 0–35)
AST: 11 U/L (ref 0–37)
Albumin: 4.6 g/dL (ref 3.5–5.2)
Alkaline Phosphatase: 56 U/L (ref 39–117)
BUN: 21 mg/dL (ref 6–23)
CO2: 27 mEq/L (ref 19–32)
Calcium: 10.7 mg/dL — ABNORMAL HIGH (ref 8.4–10.5)
Chloride: 100 mEq/L (ref 96–112)
Creatinine, Ser: 1.02 mg/dL (ref 0.40–1.20)
GFR: 55.92 mL/min — ABNORMAL LOW (ref >60.00)
Glucose, Bld: 87 mg/dL (ref 70–99)
Potassium: 4.5 mEq/L (ref 3.5–5.1)
Sodium: 137 mEq/L (ref 135–145)
Total Bilirubin: 0.6 mg/dL (ref 0.2–1.2)
Total Protein: 6.9 g/dL (ref 6.0–8.3)

## 2021-10-25 LAB — LIPID PANEL
Cholesterol: 197 mg/dL (ref 0–200)
HDL: 43.5 mg/dL (ref >39.00)
NonHDL: 153.09
Total CHOL/HDL Ratio: 5
Triglycerides: 255 mg/dL — ABNORMAL HIGH (ref 0.0–149.0)
VLDL: 51 mg/dL — ABNORMAL HIGH (ref 0.0–40.0)

## 2021-10-25 LAB — HEMOGLOBIN A1C: Hgb A1c MFr Bld: 5.5 % (ref 4.6–6.5)

## 2021-10-25 LAB — LDL CHOLESTEROL, DIRECT: Direct LDL: 115 mg/dL

## 2021-10-25 MED ORDER — PRAZOSIN HCL 1 MG PO CAPS: ORAL_CAPSULE | ORAL | 1 refills | Status: DC

## 2021-10-25 MED ORDER — PRAZOSIN HCL 5 MG PO CAPS: ORAL_CAPSULE | ORAL | 1 refills | Status: DC

## 2021-10-25 MED ORDER — PROPRANOLOL HCL ER 80 MG PO CP24: ORAL_CAPSULE | ORAL | 2 refills | Status: DC

## 2021-10-25 MED ORDER — TRULICITY 1.5 MG/0.5ML ~~LOC~~ SOAJ: 1.5000 mg | SUBCUTANEOUS | 5 refills | Status: DC

## 2021-10-25 MED ORDER — BUSPIRONE HCL 7.5 MG PO TABS: 7.5000 mg | ORAL_TABLET | Freq: Two times a day (BID) | ORAL | 2 refills | Status: DC

## 2021-10-25 MED ORDER — TRULICITY 3 MG/0.5ML ~~LOC~~ SOAJ: 3.0000 mg | SUBCUTANEOUS | 5 refills | Status: DC

## 2021-10-25 NOTE — Patient Instructions (Addendum)
Give us 2-3 business days to get the results of your labs back.  ? ?Keep the diet clean and stay active. ? ?Let us know if you need anything. ? ?EXERCISES  ?RANGE OF MOTION (ROM) AND STRETCHING EXERCISES - Low Back Pain ?Most people with lower back pain will find that their symptoms get worse with excessive bending forward (flexion) or arching at the lower back (extension). The exercises that will help resolve your symptoms will focus on the opposite motion.  ?If you have pain, numbness or tingling which travels down into your buttocks, leg or foot, the goal of the therapy is for these symptoms to move closer to your back and eventually resolve. Sometimes, these leg symptoms will get better, but your lower back pain may worsen. This is often an indication of progress in your rehabilitation. Be very alert to any changes in your symptoms and the activities in which you participated in the 24 hours prior to the change. Sharing this information with your caregiver will allow him or her to most efficiently treat your condition. ?These exercises may help you when beginning to rehabilitate your injury. Your symptoms may resolve with or without further involvement from your physician, physical therapist or athletic trainer. While completing these exercises, remember:  ?Restoring tissue flexibility helps normal motion to return to the joints. This allows healthier, less painful movement and activity. ?An effective stretch should be held for at least 30 seconds. ?A stretch should never be painful. You should only feel a gentle lengthening or release in the stretched tissue. ?FLEXION RANGE OF MOTION AND STRETCHING EXERCISES: ? ?STRETCH - Flexion, Single Knee to Chest  ?Lie on a firm bed or floor with both legs extended in front of you. ?Keeping one leg in contact with the floor, bring your opposite knee to your chest. Hold your leg in place by either grabbing behind your thigh or at your knee. ?Pull until you feel a gentle  stretch in your low back. Hold 30 seconds. ?Slowly release your grasp and repeat the exercise with the opposite side. ?Repeat 2 times. Complete this exercise 3 times per week.  ? ?STRETCH - Flexion, Double Knee to Chest ?Lie on a firm bed or floor with both legs extended in front of you. ?Keeping one leg in contact with the floor, bring your opposite knee to your chest. ?Tense your stomach muscles to support your back and then lift your other knee to your chest. Hold your legs in place by either grabbing behind your thighs or at your knees. ?Pull both knees toward your chest until you feel a gentle stretch in your low back. Hold 30 seconds. ?Tense your stomach muscles and slowly return one leg at a time to the floor. ?Repeat 2 times. Complete this exercise 3 times per week.  ? ?STRETCH - Low Trunk Rotation ?Lie on a firm bed or floor. Keeping your legs in front of you, bend your knees so they are both pointed toward the ceiling and your feet are flat on the floor. ?Extend your arms out to the side. This will stabilize your upper body by keeping your shoulders in contact with the floor. ?Gently and slowly drop both knees together to one side until you feel a gentle stretch in your low back. Hold for 30 seconds. ?Tense your stomach muscles to support your lower back as you bring your knees back to the starting position. Repeat the exercise to the other side. ?Repeat 2 times. Complete this exercise at least 3  times per week. ?  ?EXTENSION RANGE OF MOTION AND FLEXIBILITY EXERCISES: ? ?STRETCH - Extension, Prone on Elbows  ?Lie on your stomach on the floor, a bed will be too soft. Place your palms about shoulder width apart and at the height of your head. ?Place your elbows under your shoulders. If this is too painful, stack pillows under your chest. ?Allow your body to relax so that your hips drop lower and make contact more completely with the floor. ?Hold this position for 30 seconds. ?Slowly return to lying flat on  the floor. ?Repeat 2 times. Complete this exercise 3 times per week.  ? ?RANGE OF MOTION - Extension, Prone Press Ups ?Lie on your stomach on the floor, a bed will be too soft. Place your palms about shoulder width apart and at the height of your head. ?Keeping your back as relaxed as possible, slowly straighten your elbows while keeping your hips on the floor. You may adjust the placement of your hands to maximize your comfort. As you gain motion, your hands will come more underneath your shoulders. ?Hold this position 30 seconds. ?Slowly return to lying flat on the floor. ?Repeat 2 times. Complete this exercise 3 times per week.  ? ?RANGE OF MOTION- Quadruped, Neutral Spine  ?Assume a hands and knees position on a firm surface. Keep your hands under your shoulders and your knees under your hips. You may place padding under your knees for comfort. ?Drop your head and point your tailbone toward the ground below you. This will round out your lower back like an angry cat. Hold this position for 30 seconds. ?Slowly lift your head and release your tail bone so that your back sags into a large arch, like an old horse. ?Hold this position for 30 seconds. ?Repeat this until you feel limber in your low back. ?Now, find your "sweet spot." This will be the most comfortable position somewhere between the two previous positions. This is your neutral spine. Once you have found this position, tense your stomach muscles to support your low back. ?Hold this position for 30 seconds. ?Repeat 2 times. Complete this exercise 3 times per week.  ? ?STRENGTHENING EXERCISES - Low Back Sprain ?These exercises may help you when beginning to rehabilitate your injury. These exercises should be done near your "sweet spot." This is the neutral, low-back arch, somewhere between fully rounded and fully arched, that is your least painful position. When performed in this safe range of motion, these exercises can be used for people who have either a  flexion or extension based injury. These exercises may resolve your symptoms with or without further involvement from your physician, physical therapist or athletic trainer. While completing these exercises, remember:  ?Muscles can gain both the endurance and the strength needed for everyday activities through controlled exercises. ?Complete these exercises as instructed by your physician, physical therapist or athletic trainer. Increase the resistance and repetitions only as guided. ?You may experience muscle soreness or fatigue, but the pain or discomfort you are trying to eliminate should never worsen during these exercises. If this pain does worsen, stop and make certain you are following the directions exactly. If the pain is still present after adjustments, discontinue the exercise until you can discuss the trouble with your caregiver. ? ?STRENGTHENING - Deep Abdominals, Pelvic Tilt  ?Lie on a firm bed or floor. Keeping your legs in front of you, bend your knees so they are both pointed toward the ceiling and your feet are flat  on the floor. ?Tense your lower abdominal muscles to press your low back into the floor. This motion will rotate your pelvis so that your tail bone is scooping upwards rather than pointing at your feet or into the floor. ?With a gentle tension and even breathing, hold this position for 3 seconds. ?Repeat 2 times. Complete this exercise 3 times per week.  ? ?STRENGTHENING - Abdominals, Crunches  ?Lie on a firm bed or floor. Keeping your legs in front of you, bend your knees so they are both pointed toward the ceiling and your feet are flat on the floor. Cross your arms over your chest. ?Slightly tip your chin down without bending your neck. ?Tense your abdominals and slowly lift your trunk high enough to just clear your shoulder blades. Lifting higher can put excessive stress on the lower back and does not further strengthen your abdominal muscles. ?Control your return to the starting  position. ?Repeat 2 times. Complete this exercise 3 times per week.  ? ?STRENGTHENING - Quadruped, Opposite UE/LE Lift  ?Assume a hands and knees position on a firm surface. Keep your hands under your shoul

## 2021-10-25 NOTE — Progress Notes (Signed)
CC: F/u ? ?Subjective: ?Hyperlipidemia ?Patient presents for Hyperlipidemia follow up. ?Currently taking Tricor 48 mg/d, Lipitor 40 mg/d. and compliance with treatment thus far has been good. ?She denies myalgias. ?She is adhering to a healthy diet. ?Exercise: none ?The patient is not known to have coexisting coronary artery disease. ? ?Hypertension ?Patient presents for hypertension follow up. ?She does not monitor home blood pressures. ?She is compliant with medications. ?Patient has these side effects of medication: none ?Diet/exercise as above.  ? ?Hypothyroidism ?Patient presents for follow-up of hypothyroidism.  ?Reports compliance with medication. ?Current symptoms include: denies fatigue, weight changes, heat/cold intolerance, bowel/skin changes or CVS symptoms ?She believes her dose should be unchanged ? ?Past Medical History:  ?Diagnosis Date  ? Anxiety   ? Benzodiazepine dependence (HCC)   ? Colonic polyp   ? mulitple polyps. repeat in 05/2019  ? Constipation   ? IBS  ? COPD (chronic obstructive pulmonary disease) (HCC)   ? Depression   ? GERD (gastroesophageal reflux disease)   ? Hyperlipidemia   ? Hypertension   ? Insomnia   ? Migraine headache   ? OAB (overactive bladder)   ? Osteoarthritis   ? Osteoporosis 12/19/2017  ? Post-surgical hypothyroidism   ? Pulmonary embolism (HCC)   ? Eliquis to stop 05/03/19  ? Scoliosis   ? ? ?Objective: ?BP 108/68   Pulse 77   Temp 98.4 ?F (36.9 ?C)   Resp 16   Ht 4\' 11"  (1.499 m)   Wt 128 lb 12.8 oz (58.4 kg)   SpO2 93%   BMI 26.01 kg/m?  ?General: Awake, appears stated age ?HEENT: MMM ?Heart: RRR, no LE edema, no bruits ?Lungs: CTAB, no rales, wheezes or rhonchi. No accessory muscle use ?Psych: Age appropriate judgment and insight, normal affect and mood ? ?Assessment and Plan: ?Hyperlipidemia, unspecified hyperlipidemia type - Plan: Lipid panel, Comprehensive metabolic panel ? ?Postoperative hypothyroidism - Plan: TSH, T4, free ? ?OAB (overactive  bladder) ? ?Hyperglycemia - Plan: Hemoglobin A1c ? ?Bereavement - Plan: prazosin (MINIPRESS) 5 MG capsule ? ?GAD (generalized anxiety disorder) - Plan: busPIRone (BUSPAR) 7.5 MG tablet ? ?Benign essential tremor - Plan: propranolol ER (INDERAL LA) 80 MG 24 hr capsule ? ?Chronic, stable.  Continue Lipitor 40 mg daily, Tricor 48 mg daily.  Counseled on diet and exercise. ?Chronic, stable.  Continue levothyroxine 100 mcg daily.  Check labs. ?Continue Myrbetriq. ?Check A1c.  Her insurance has been covering Trulicity, will increase to 3 mg weekly ?Cont Prazosin 7 mg qhs. ?Cont Buspar 7.5 mg bid.  ?Cont Inderal LA 80 mg/d.  ?F/u in 6 mo for CPE or prn. ?The patient voiced understanding and agreement to the plan. ? ? , DO ?10/25/21  ?11:42 AM ? ? ? ? ? ?

## 2021-10-26 ENCOUNTER — Other Ambulatory Visit: Payer: Self-pay | Admitting: Family Medicine

## 2021-10-26 LAB — TSH: TSH: 0.08 u[IU]/mL — ABNORMAL LOW (ref 0.35–5.50)

## 2021-10-26 LAB — T4, FREE: Free T4: 1.73 ng/dL — ABNORMAL HIGH (ref 0.60–1.60)

## 2021-10-26 MED ORDER — LEVOTHYROXINE SODIUM 100 MCG PO TABS: 100.0000 ug | ORAL_TABLET | Freq: Every day | ORAL | 3 refills | Status: DC

## 2021-10-26 MED ORDER — FENOFIBRATE 145 MG PO TABS: 145.0000 mg | ORAL_TABLET | Freq: Every day | ORAL | 3 refills | Status: DC

## 2021-11-01 ENCOUNTER — Ambulatory Visit (INDEPENDENT_AMBULATORY_CARE_PROVIDER_SITE_OTHER): Payer: PPO | Admitting: Psychiatry

## 2021-11-01 ENCOUNTER — Encounter: Payer: Self-pay | Admitting: Psychiatry

## 2021-11-01 DIAGNOSIS — F5101 Primary insomnia: Secondary | ICD-10-CM | POA: Diagnosis not present

## 2021-11-01 DIAGNOSIS — F39 Unspecified mood [affective] disorder: Secondary | ICD-10-CM

## 2021-11-01 DIAGNOSIS — F411 Generalized anxiety disorder: Secondary | ICD-10-CM

## 2021-11-01 MED ORDER — DIAZEPAM 5 MG PO TABS: 5.0000 mg | ORAL_TABLET | Freq: Three times a day (TID) | ORAL | 5 refills | Status: DC

## 2021-11-01 MED ORDER — MIRTAZAPINE 45 MG PO TABS: 45.0000 mg | ORAL_TABLET | Freq: Every day | ORAL | 1 refills | Status: DC

## 2021-11-01 MED ORDER — RISPERIDONE 1 MG PO TABS: ORAL_TABLET | ORAL | 1 refills | Status: DC

## 2021-11-01 NOTE — Progress Notes (Signed)
Brenda Dunn ?[id number] ?12-01-1950 ?71 y.o. ? ?Virtual Visit via Telephone Note ? ?I connected with pt on 11/01/21 at 10:00 AM EDT by telephone and verified that I am speaking with the correct person using two identifiers. ?  ?I discussed the limitations, risks, security and privacy concerns of performing an evaluation and management service by telephone and the availability of in person appointments. I also discussed with the patient that there may be a patient responsible charge related to this service. The patient expressed understanding and agreed to proceed. ?  ?I discussed the assessment and treatment plan with the patient. The patient was provided an opportunity to ask questions and all were answered. The patient agreed with the plan and demonstrated an understanding of the instructions. ?  ?The patient was advised to call back or seek an in-person evaluation if the symptoms worsen or if the condition fails to improve as anticipated. ? ?I provided 18 minutes of non-face-to-face time during this encounter.  The patient was located at home.  The provider was located at [facility]. ? ? ?Brenda Dunn, PMHNP ? ? ?Subjective:  ? ?Patient ID:  Brenda Dunn is a 71 y.o. (DOB 05/04/1951) female. ? ?Chief Complaint:  ?Chief Complaint  ?Patient presents with  ? Follow-up  ?  Anxiety, mood disturbance, insomnia  ? ? ?HPI ?Brenda Dunn presents for follow-up of depression, anxiety, and insomnia.  ?She reports that that she has been doing well overall. She reports that she has "good days and bad days" in terms of grief, "but overall I am doing better than I thought I would." Denies depressed mood. She reports that her anxiety has been well controlled. She reports that she has been sleeping well. She has been sleeping 8-9 hours a night. Energy has been ok. Would like to start walking. Motivation has been good. She reports that she will break tasks and projects into steps and takes frequent breaks. She reports  that she eats once daily at lunch. She reports that this has been her long-standing meal pattern. Concentration has been consistent with her baseline and reports long-standing difficulty with concentration since childhood. Denies anhedonia. Denies SI.   ? ?She reports that she continues to have good social support. She sees her twin sister regularly and they will go out for meals together.  ? ?Past Psychiatric Medication Trials: ?Remeron- Had worsening depression and insomnia with 30 mg dose. ?Olanzapine ?Seroquel  ?Risperdal-helpful for mood, anxiety, and insomnia ?Diazepam ?Gabapentin ?Trazodone ?Trileptal- hyponatremia ?Ambien ? ?Review of Systems:  ?Review of Systems  ?Respiratory:  Negative for shortness of breath.   ?Musculoskeletal:  Positive for back pain. Negative for gait problem.  ?Neurological:  Negative for tremors.  ?Psychiatric/Behavioral:    ?     Please refer to HPI  ? ?Medications: I have reviewed the patient's current medications. ? ?Current Outpatient Medications  ?Medication Sig Dispense Refill  ? acetaminophen (TYLENOL) 500 MG tablet Take 1,000 mg by mouth every 6 (six) hours as needed for moderate pain.    ? albuterol (VENTOLIN HFA) 108 (90 Base) MCG/ACT inhaler Inhale 2 puffs into the lungs every 4 (four) hours as needed for wheezing or shortness of breath. 2 each 3  ? alendronate (FOSAMAX) 70 MG tablet TAKE 1 TABLET BY MOUTH EVERY WEEK WITH FULL GLASS OF WATER ON EMPTY STOMACH 12 tablet 3  ? aspirin EC 81 MG tablet Take 1 tablet (81 mg total) by mouth daily. 150 tablet 2  ? atorvastatin (LIPITOR) 40  MG tablet TAKE 1 TABLET BY MOUTH EVERYDAY AT BEDTIME 90 tablet 1  ? budesonide-formoterol (SYMBICORT) 80-4.5 MCG/ACT inhaler TAKE 2 PUFFS BY MOUTH TWICE A DAY 30.6 g 1  ? busPIRone (BUSPAR) 7.5 MG tablet Take 1 tablet (7.5 mg total) by mouth 2 (two) times daily. 180 tablet 2  ? Calcium Carbonate-Vitamin D (CALTRATE 600+D) 600-400 MG-UNIT tablet Take 1 tablet by mouth 2 (two) times daily. 60  tablet 11  ? clopidogrel (PLAVIX) 75 MG tablet TAKE 1 TABLET BY MOUTH EVERY DAY 30 tablet 0  ? diclofenac Sodium (VOLTAREN) 1 % GEL Apply 4 g topically 4 (four) times daily. To affected joint. 100 g 11  ? docusate sodium (COLACE) 100 MG capsule Take 100-200 mg by mouth at bedtime.    ? Dulaglutide (TRULICITY) 3 0000000 SOPN Inject 3 mg as directed once a week. 2 mL 5  ? esomeprazole (NEXIUM) 40 MG capsule TAKE 1 CAPSULE BY MOUTH EVERY DAY 90 capsule 1  ? fenofibrate (TRICOR) 145 MG tablet Take 1 tablet (145 mg total) by mouth daily. 30 tablet 3  ? gabapentin (NEURONTIN) 400 MG capsule TAKE 1 CAPSULE BY MOUTH TWICE A DAY AND TAKE 4 CAPSULES AT BEDTIME 540 capsule 1  ? levothyroxine (SYNTHROID) 100 MCG tablet Take 1 tablet (100 mcg total) by mouth daily. 30 tablet 3  ? LINZESS 290 MCG CAPS capsule TAKE 1 CAPSULE BY MOUTH EVERY DAY ON EMPTY STOMACH AT LEAST 30 MINUTES PRIOR TO 1ST MEAL OF THE DAY 90 capsule 1  ? lisinopril (ZESTRIL) 40 MG tablet TAKE 1 TABLET BY MOUTH EVERY DAY 90 tablet 1  ? MYRBETRIQ 50 MG TB24 tablet TAKE 1 TABLET BY MOUTH EVERY DAY 90 tablet 1  ? pantoprazole (PROTONIX) 40 MG tablet TAKE 1 TABLET BY MOUTH EVERY DAY 90 tablet 1  ? prazosin (MINIPRESS) 1 MG capsule TAKE 2 CAPSULES (2 MG TOTAL) BY MOUTH EVERY DAY AT BEDTIME with the 5 mg capsule for a total of 7 mg nightly. 180 capsule 1  ? prazosin (MINIPRESS) 5 MG capsule TAKE 1 CAPSULE BY MOUTH EVERYDAY AT BEDTIME with the 2 mg of the 1 mg prazosin, a total of 7 mg nightly. 90 capsule 1  ? propranolol ER (INDERAL LA) 80 MG 24 hr capsule TAKE 1 CAPSULE BY MOUTH EVERY DAY 90 capsule 2  ? [START ON 11/23/2021] diazepam (VALIUM) 5 MG tablet Take 1 tablet (5 mg total) by mouth in the morning, at noon, and at bedtime. 90 tablet 5  ? levocetirizine (XYZAL) 5 MG tablet TAKE 1 TABLET BY MOUTH EVERY DAY IN THE EVENING (Patient not taking: Reported on 11/01/2021) 90 tablet 1  ? mirtazapine (REMERON) 45 MG tablet Take 1 tablet (45 mg total) by mouth at bedtime.  90 tablet 1  ? montelukast (SINGULAIR) 10 MG tablet TAKE 1 TABLET BY MOUTH EVERYDAY AT BEDTIME (Patient not taking: Reported on 11/01/2021) 90 tablet 2  ? nitroGLYCERIN (NITRODUR - DOSED IN MG/24 HR) 0.4 mg/hr patch Place 1 patch (0.4 mg total) onto the skin daily. 30 patch 12  ? risperiDONE (RISPERDAL) 1 MG tablet TAKE 1 TABLET BY MOUTH EVERYDAY AT BEDTIME 90 tablet 1  ? ?No current facility-administered medications for this visit.  ? ? ?Medication Side Effects: None  Denies any involuntary movements.  ? ?Allergies: No Known Allergies ? ?Past Medical History:  ?Diagnosis Date  ? Anxiety   ? Benzodiazepine dependence ([place])   ? Colonic polyp   ? mulitple polyps. repeat in 05/2019  ?  Constipation   ? IBS  ? COPD (chronic obstructive pulmonary disease) ([place])   ? Depression   ? GERD (gastroesophageal reflux disease)   ? Hyperlipidemia   ? Hypertension   ? Insomnia   ? Migraine headache   ? OAB (overactive bladder)   ? Osteoarthritis   ? Osteoporosis 12/19/2017  ? Post-surgical hypothyroidism   ? Pulmonary embolism ([place])   ? Eliquis to stop 05/03/19  ? Scoliosis   ? ? ?Family History  ?Problem Relation Age of Onset  ? Breast cancer Mother   ? Arthritis Mother   ? Hearing loss Mother   ? Hyperlipidemia Mother   ? Hypertension Mother   ? Anxiety disorder Mother   ? Lung cancer Maternal Uncle   ? Anxiety disorder Maternal Uncle   ? Lung cancer Maternal Grandmother   ? Heart attack Maternal Grandmother   ? Anxiety disorder Maternal Grandmother   ? Asthma Father   ? COPD Father   ? Hyperlipidemia Father   ? Hypertension Father   ? Alcohol abuse Father   ? Anxiety disorder Sister   ? Hypertension Sister   ? Thyroid cancer Sister   ? Anxiety disorder Brother   ? Hypertension Brother   ? Drug abuse Son   ? Anxiety disorder Sister   ? Hypertension Sister   ? Anxiety disorder Sister   ? Hypertension Sister   ? Hyperlipidemia Son   ? ? ?Social History  ? ?Socioeconomic History  ? Marital status: Married  ?  Spouse name: Not on file  ?  Number of children: Not on file  ? Years of education: Not on file  ? Highest education level: Not on file  ?Occupational History  ? Occupation: disabled due "my nerves"  ?Tobacco Use  ? Smoking status: Every

## 2021-11-02 DIAGNOSIS — H25013 Cortical age-related cataract, bilateral: Secondary | ICD-10-CM | POA: Diagnosis not present

## 2021-11-02 DIAGNOSIS — H52203 Unspecified astigmatism, bilateral: Secondary | ICD-10-CM | POA: Diagnosis not present

## 2021-11-02 DIAGNOSIS — H2513 Age-related nuclear cataract, bilateral: Secondary | ICD-10-CM | POA: Diagnosis not present

## 2021-11-02 DIAGNOSIS — H35363 Drusen (degenerative) of macula, bilateral: Secondary | ICD-10-CM | POA: Diagnosis not present

## 2021-12-02 ENCOUNTER — Other Ambulatory Visit: Payer: Self-pay | Admitting: Family Medicine

## 2021-12-07 ENCOUNTER — Ambulatory Visit (INDEPENDENT_AMBULATORY_CARE_PROVIDER_SITE_OTHER): Payer: PPO | Admitting: Family Medicine

## 2021-12-07 ENCOUNTER — Encounter: Payer: Self-pay | Admitting: Family Medicine

## 2021-12-07 VITALS — BP 110/71 | HR 78 | Temp 98.0°F | Ht 59.0 in | Wt 129.2 lb

## 2021-12-07 DIAGNOSIS — E890 Postprocedural hypothyroidism: Secondary | ICD-10-CM | POA: Diagnosis not present

## 2021-12-07 DIAGNOSIS — E782 Mixed hyperlipidemia: Secondary | ICD-10-CM | POA: Diagnosis not present

## 2021-12-07 LAB — HEPATIC FUNCTION PANEL
ALT: 11 U/L (ref 0–35)
AST: 14 U/L (ref 0–37)
Albumin: 4.5 g/dL (ref 3.5–5.2)
Alkaline Phosphatase: 35 U/L — ABNORMAL LOW (ref 39–117)
Bilirubin, Direct: 0.1 mg/dL (ref 0.0–0.3)
Total Bilirubin: 0.6 mg/dL (ref 0.2–1.2)
Total Protein: 6.6 g/dL (ref 6.0–8.3)

## 2021-12-07 LAB — T4, FREE: Free T4: 1.53 ng/dL (ref 0.60–1.60)

## 2021-12-07 LAB — TSH: TSH: 0.11 u[IU]/mL — ABNORMAL LOW (ref 0.35–5.50)

## 2021-12-07 LAB — LIPID PANEL
Cholesterol: 157 mg/dL (ref 0–200)
HDL: 55.5 mg/dL (ref >39.00)
LDL Cholesterol: 69 mg/dL (ref 0–99)
NonHDL: 101.78
Total CHOL/HDL Ratio: 3
Triglycerides: 165 mg/dL — ABNORMAL HIGH (ref 0.0–149.0)
VLDL: 33 mg/dL (ref 0.0–40.0)

## 2021-12-07 NOTE — Patient Instructions (Signed)
Give [place] 2-3 business days to get the results of your labs back.  ? ?Let me know if you change your mind regarding the lesion on your toe.  ? ?Let us know if you need anything. ?

## 2021-12-07 NOTE — Progress Notes (Signed)
Chief Complaint  ?Patient presents with  ? Follow-up  ?  Labs thyroid and lipids.  ? ? ?Subjective: ?Hyperlipidemia ?Patient presents for Hyperlipidemia follow up. ?Currently taking Lipitor 40 mg/d, fenofibrate 145 mg/d and compliance with treatment thus far has been good. ?She denies myalgias. ?She is adhering to a healthy diet. ?Exercise: walking ?The patient is not known to have coexisting coronary artery disease. ? ?Hypothyroidism ?Patient presents for follow-up of hypothyroidism.  ?Reports compliance with medication- levothyroxine 100 mcg/d, recently decreased from 112 mcg after elevated thyroid levels. ?Current symptoms include: denies fatigue, weight changes, heat/cold intolerance, bowel/skin changes or CVS symptoms ?She believes her dose should be not significantly changed ? ?Past Medical History:  ?Diagnosis Date  ? Anxiety   ? Benzodiazepine dependence (HCC)   ? Colonic polyp   ? mulitple polyps. repeat in 05/2019  ? Constipation   ? IBS  ? COPD (chronic obstructive pulmonary disease) (HCC)   ? Depression   ? GERD (gastroesophageal reflux disease)   ? Hyperlipidemia   ? Hypertension   ? Insomnia   ? Migraine headache   ? OAB (overactive bladder)   ? Osteoarthritis   ? Osteoporosis 12/19/2017  ? Post-surgical hypothyroidism   ? Pulmonary embolism (HCC)   ? Eliquis to stop 05/03/19  ? Scoliosis   ? ? ?Objective: ?BP 110/71   Pulse 78   Temp 98 ?F (36.7 ?C) (Oral)   Ht 4\' 11"  (1.499 m)   Wt 129 lb 4 oz (58.6 kg)   SpO2 94%   BMI 26.11 kg/m?  ?General: Awake, appears stated age ?HEENT: MMM ?Heart: RRR, no LE edema, no bruits ?Lungs: CTAB, no rales, wheezes or rhonchi. No accessory muscle use ?Psych: Age appropriate judgment and insight, normal affect and mood ? ?Assessment and Plan: ?Mixed hyperlipidemia - Plan: Hepatic function panel, Lipid panel ? ?Postoperative hypothyroidism - Plan: T4, free, TSH ? ?Chronic, hopefully stable. Cont Lipitor 40 mg/d, fenofibrate 145 mg/d. Counseled on  diet/exercise. ?Chronic, hopefully stable. Cont levothyroxine 100 mcg/d. Feels the same, will hope the T4 has normalized.  ?F/u pending above. ?The patient voiced understanding and agreement to the plan. ? ? , DO ?12/07/21  ?10:23 AM ? ? ? ? ? ?

## 2021-12-16 DIAGNOSIS — H25811 Combined forms of age-related cataract, right eye: Secondary | ICD-10-CM | POA: Diagnosis not present

## 2021-12-16 DIAGNOSIS — H2511 Age-related nuclear cataract, right eye: Secondary | ICD-10-CM | POA: Diagnosis not present

## 2021-12-16 DIAGNOSIS — H25011 Cortical age-related cataract, right eye: Secondary | ICD-10-CM | POA: Diagnosis not present

## 2022-02-04 ENCOUNTER — Ambulatory Visit (INDEPENDENT_AMBULATORY_CARE_PROVIDER_SITE_OTHER): Payer: PPO | Admitting: Family Medicine

## 2022-02-04 ENCOUNTER — Encounter: Payer: Self-pay | Admitting: Family Medicine

## 2022-02-04 VITALS — BP 120/62 | HR 78 | Temp 98.0°F | Ht 59.0 in | Wt 132.4 lb

## 2022-02-04 DIAGNOSIS — B079 Viral wart, unspecified: Secondary | ICD-10-CM

## 2022-02-04 DIAGNOSIS — M546 Pain in thoracic spine: Secondary | ICD-10-CM

## 2022-02-04 MED ORDER — METHYLPREDNISOLONE ACETATE 80 MG/ML IJ SUSP
80.0000 mg | Freq: Once | INTRAMUSCULAR | Status: AC
  Administered 2022-02-04: 80 mg via INTRAMUSCULAR

## 2022-02-04 NOTE — Progress Notes (Signed)
Chief Complaint  Patient presents with   toe pain    Brenda Dunn is a 71 y.o. female here for a skin complaint.  Duration: a few weeks Location: Dorsum of middle phalanx of fourth digit on the left Pruritic? No Painful? Yes Drainage? No New soaps/lotions/topicals/detergents? No Sick contacts? No Other associated symptoms: Thickened tissue Therapies tried thus far: None  Patient has a history of chronic mid back pain.  It is worsened recently without any injury or change in activity.  No neurologic signs or symptoms.  She denies any bruising, bleeding, or swelling.  She is requesting an injection today.  She was referred to sports medicine but cannot afford the out-of-pocket cost.  Past Medical History:  Diagnosis Date   Anxiety    Benzodiazepine dependence (HCC)    Colonic polyp    mulitple polyps. repeat in 05/2019   Constipation    IBS   COPD (chronic obstructive pulmonary disease) (HCC)    Depression    GERD (gastroesophageal reflux disease)    Hyperlipidemia    Hypertension    Insomnia    Migraine headache    OAB (overactive bladder)    Osteoarthritis    Osteoporosis 12/19/2017   Post-surgical hypothyroidism    Pulmonary embolism (HCC)    Eliquis to stop 05/03/19   Scoliosis     BP 120/62   Pulse 78   Temp 98 F (36.7 C) (Oral)   Ht 4\' 11"  (1.499 m)   Wt 132 lb 6 oz (60 kg)   SpO2 99%   BMI 26.74 kg/m  Gen: awake, alert, appearing stated age Lungs: No accessory muscle use Skin: Circular and raised lesion with central core of the dorsum of her fourth digit on the left.  Mild TTP.  No telangiectasias, drainage, erythema, fluctuance, excoriation Psych: Age appropriate judgment and insight  Procedure note: Paring down of hyperkeratotic lesion Verbal consent obtained A 15 blade scalpel was used to pare down the lesion of interest as noted above. There were no complications or immediate problems noted.  The patient tolerated the procedure well.  Procedure  note: cryotherapy Verbal consent obtained 1 skin lesion treated Liquid nitrogen was applied via a thin spray creating an ice ball with 1-2 mm corona surrounding the lesion The patient tolerated the procedure well There were no immediate complications noted  Verruca - Plan: PR DESTRUCTION BENIGN LESIONS UP TO 14  Acute thoracic back pain, unspecified back pain laterality - Plan: methylPREDNISolone acetate (DEPO-MEDROL) injection 80 mg  New problem.  Lesion pared down today and liquid nitrogen applied.  Once this lesion heals from the nitrogen application, she will start using daily aspirin paste.  Instructions provided in her AVS. Physical therapy is financially not feasible, nor seeing a specialist at this time.  We will give an IM injection today of Depo-Medrol.  Stretches and exercises recommended.  Heat, ice, Tylenol. She has a lesion on her right toe that I recommended to have biopsy which she declined last time but will plan on having done in 1 month when she follows up here.  She does not want to see a dermatologist for this at this time either. F/u as originally scheduled. The patient voiced understanding and agreement to the plan.  [place], DO 02/04/22 4:06 PM

## 2022-02-04 NOTE — Patient Instructions (Signed)
Wait a few days before applying an aspirin paste to your toe. Take a couple tabs of aspirin, crush them and add water to make a paste. Apply to your toe for 10-15 minutes daily until the lesion resolves.  Think about removing the lesion on your other toe.   Let us know if you need anything.

## 2022-02-07 ENCOUNTER — Telehealth: Payer: Self-pay | Admitting: Psychiatry

## 2022-02-07 NOTE — Telephone Encounter (Signed)
Pt called reporting she has no Mirtazapine 45 mg and pharmacy is telling her she can't fill til August. Please check and advise pt. Pt [phone]. Apt 10/3

## 2022-02-07 NOTE — Telephone Encounter (Signed)
Pt called back. She found her Mirtazapine.  No action needed.

## 2022-02-10 ENCOUNTER — Other Ambulatory Visit: Payer: Self-pay | Admitting: Family Medicine

## 2022-03-09 ENCOUNTER — Ambulatory Visit (INDEPENDENT_AMBULATORY_CARE_PROVIDER_SITE_OTHER): Payer: PPO | Admitting: Family Medicine

## 2022-03-09 ENCOUNTER — Encounter: Payer: Self-pay | Admitting: Family Medicine

## 2022-03-09 VITALS — BP 120/68 | HR 90 | Temp 99.0°F | Ht 59.0 in | Wt 129.4 lb

## 2022-03-09 DIAGNOSIS — M810 Age-related osteoporosis without current pathological fracture: Secondary | ICD-10-CM

## 2022-03-09 DIAGNOSIS — Z720 Tobacco use: Secondary | ICD-10-CM

## 2022-03-09 DIAGNOSIS — E782 Mixed hyperlipidemia: Secondary | ICD-10-CM | POA: Diagnosis not present

## 2022-03-09 DIAGNOSIS — B079 Viral wart, unspecified: Secondary | ICD-10-CM

## 2022-03-09 DIAGNOSIS — Z0001 Encounter for general adult medical examination with abnormal findings: Secondary | ICD-10-CM | POA: Diagnosis not present

## 2022-03-09 DIAGNOSIS — G8929 Other chronic pain: Secondary | ICD-10-CM | POA: Diagnosis not present

## 2022-03-09 DIAGNOSIS — E890 Postprocedural hypothyroidism: Secondary | ICD-10-CM | POA: Diagnosis not present

## 2022-03-09 DIAGNOSIS — M545 Low back pain, unspecified: Secondary | ICD-10-CM | POA: Diagnosis not present

## 2022-03-09 DIAGNOSIS — Z634 Disappearance and death of family member: Secondary | ICD-10-CM

## 2022-03-09 DIAGNOSIS — Z0000 Encounter for general adult medical examination without abnormal findings: Secondary | ICD-10-CM

## 2022-03-09 LAB — T4, FREE: Free T4: 0.71 ng/dL (ref 0.60–1.60)

## 2022-03-09 LAB — LIPID PANEL
Cholesterol: 169 mg/dL (ref 0–200)
HDL: 49.2 mg/dL (ref >39.00)
NonHDL: 120.15
Total CHOL/HDL Ratio: 3
Triglycerides: 207 mg/dL — ABNORMAL HIGH (ref 0.0–149.0)
VLDL: 41.4 mg/dL — ABNORMAL HIGH (ref 0.0–40.0)

## 2022-03-09 LAB — VITAMIN D 25 HYDROXY (VIT D DEFICIENCY, FRACTURES): VITD: 43.17 ng/mL (ref 30.00–100.00)

## 2022-03-09 LAB — CBC
HCT: 41.4 % (ref 36.0–46.0)
Hemoglobin: 13.8 g/dL (ref 12.0–15.0)
MCHC: 33.4 g/dL (ref 30.0–36.0)
MCV: 98.2 fl (ref 78.0–100.0)
Platelets: 166 10*3/uL (ref 150.0–400.0)
RBC: 4.21 Mil/uL (ref 3.87–5.11)
RDW: 12.9 % (ref 11.5–15.5)
WBC: 6 10*3/uL (ref 4.0–10.5)

## 2022-03-09 LAB — COMPREHENSIVE METABOLIC PANEL
ALT: 16 U/L (ref 0–35)
AST: 18 U/L (ref 0–37)
Albumin: 4.9 g/dL (ref 3.5–5.2)
Alkaline Phosphatase: 57 U/L (ref 39–117)
BUN: 14 mg/dL (ref 6–23)
CO2: 23 mEq/L (ref 19–32)
Calcium: 10.2 mg/dL (ref 8.4–10.5)
Chloride: 103 mEq/L (ref 96–112)
Creatinine, Ser: 0.81 mg/dL (ref 0.40–1.20)
GFR: 73.54 mL/min (ref >60.00)
Glucose, Bld: 77 mg/dL (ref 70–99)
Potassium: 3.8 mEq/L (ref 3.5–5.1)
Sodium: 139 mEq/L (ref 135–145)
Total Bilirubin: 0.5 mg/dL (ref 0.2–1.2)
Total Protein: 7.3 g/dL (ref 6.0–8.3)

## 2022-03-09 LAB — TSH: TSH: 21.6 u[IU]/mL — ABNORMAL HIGH (ref 0.35–5.50)

## 2022-03-09 LAB — LDL CHOLESTEROL, DIRECT: Direct LDL: 82 mg/dL

## 2022-03-09 MED ORDER — PRAZOSIN HCL 1 MG PO CAPS: ORAL_CAPSULE | ORAL | 1 refills | Status: DC

## 2022-03-09 MED ORDER — TRULICITY 3 MG/0.5ML ~~LOC~~ SOAJ: 3.0000 mg | SUBCUTANEOUS | 5 refills | Status: DC

## 2022-03-09 MED ORDER — PRAZOSIN HCL 5 MG PO CAPS: ORAL_CAPSULE | ORAL | 1 refills | Status: DC

## 2022-03-09 MED ORDER — METHYLPREDNISOLONE ACETATE 80 MG/ML IJ SUSP
80.0000 mg | Freq: Once | INTRAMUSCULAR | Status: AC
  Administered 2022-03-09: 80 mg via INTRAMUSCULAR

## 2022-03-09 NOTE — Patient Instructions (Addendum)
Give [place] 2-3 business days to get the results of your labs back.   Keep the diet clean and stay active.  I recommend getting the flu shot in mid October. This suggestion would change if the CDC comes out with a different recommendation.   Please get me a copy of your advanced directive form at your convenience.   Continue applying the aspirin paste daily.  Let us know if you need anything.

## 2022-03-09 NOTE — Progress Notes (Signed)
Chief Complaint  Patient presents with   Annual Exam    Eye drainage Patient does not use my chart. She request a phone all regarding any lab results.     Well Woman Brenda Dunn is here for a complete physical.   Her last physical was >1 year ago.  Current diet: in general, a "healthy" diet. Current exercise: walking. Weight is stable and she denies daytime fatigue. Seatbelt? Yes Advanced directive? No  Health Maintenance Colonoscopy- Yes Shingrix- Yes Lung cancer screening- Yes DEXA- Yes Mammogram- Yes Tetanus- Yes Pneumonia- Yes Hep C screen- Yes  Low back pain Hx of low back pain. Currently flaring over recent days. No inj or change in activity. No bowel/bladder incontinence, neuro s/s's, redness, bruising, swelling. Takes Tylenol, does some stretching at home. Had steroid injection IM here in the past which did help.  Patient has a wart on her left fourth toe.  She had been applying aspirin paste to it daily for 2 weeks and it did help.  She is requesting it to be frozen today.  Past Medical History:  Diagnosis Date   Anxiety    Benzodiazepine dependence (HCC)    Colonic polyp    mulitple polyps. repeat in 05/2019   Constipation    IBS   COPD (chronic obstructive pulmonary disease) (HCC)    Depression    GERD (gastroesophageal reflux disease)    Hyperlipidemia    Hypertension    Insomnia    Migraine headache    OAB (overactive bladder)    Osteoarthritis    Osteoporosis 12/19/2017   Post-surgical hypothyroidism    Pulmonary embolism (HCC)    Eliquis to stop 05/03/19   Scoliosis      Past Surgical History:  Procedure Laterality Date   ABDOMINAL AORTOGRAM W/LOWER EXTREMITY Bilateral 03/24/2020   Procedure: ABDOMINAL AORTOGRAM W/LOWER EXTREMITY;  Surgeon: Nada Libman, MD;  Location: [facility];  Service: Cardiovascular;  Laterality: Bilateral;   BREAST CYST ASPIRATION Left    HEMORRHOID SURGERY     PERIPHERAL VASCULAR INTERVENTION Bilateral  03/24/2020   Procedure: PERIPHERAL VASCULAR INTERVENTION;  Surgeon: Nada Libman, MD;  Location: [facility];  Service: Cardiovascular;  Laterality: Bilateral;  ILIAC STENTS   TH[facility]ROIDECTOMY     at [facility]   TUBAL LIGATION  1979    Medications  Current Outpatient Medications on File Prior to Visit  Medication Sig Dispense Refill   acetaminophen (TYLENOL) 500 MG tablet Take 1,000 mg by mouth every 6 (six) hours as needed for moderate pain.     albuterol (VENTOLIN HFA) 108 (90 Base) MCG/ACT inhaler Inhale 2 puffs into the lungs every 4 (four) hours as needed for wheezing or shortness of breath. 2 each 3   alendronate (FOSAMAX) 70 MG tablet TAKE 1 TABLET BY MOUTH EVERY WEEK WITH FULL GLASS OF WATER ON EMPTY STOMACH 12 tablet 3   aspirin EC 81 MG tablet Take 1 tablet (81 mg total) by mouth daily. 150 tablet 2   atorvastatin (LIPITOR) 40 MG tablet TAKE 1 TABLET BY MOUTH EVERYDAY AT BEDTIME 90 tablet 1   budesonide-formoterol (SYMBICORT) 80-4.5 MCG/ACT inhaler TAKE 2 PUFFS BY MOUTH TWICE A DAY 30.6 each 1   busPIRone (BUSPAR) 7.5 MG tablet Take 1 tablet (7.5 mg total) by mouth 2 (two) times daily. 180 tablet 2   Calcium Carbonate-Vitamin D (CALTRATE 600+D) 600-400 MG-UNIT tablet Take 1 tablet by mouth 2 (two) times daily. 60 tablet 11   clopidogrel (PLAVIX) 75 MG  tablet TAKE 1 TABLET BY MOUTH EVERY DAY 30 tablet 0   diclofenac Sodium (VOLTAREN) 1 % GEL Apply 4 g topically 4 (four) times daily. To affected joint. 100 g 11   docusate sodium (COLACE) 100 MG capsule Take 100-200 mg by mouth at bedtime.     esomeprazole (NEXIUM) 40 MG capsule TAKE 1 CAPSULE BY MOUTH EVERY DAY 90 capsule 1   fenofibrate (TRICOR) 145 MG tablet Take 1 tablet (145 mg total) by mouth daily. 30 tablet 3   gabapentin (NEURONTIN) 400 MG capsule TAKE 1 CAPSULE BY MOUTH TWICE A DAY AND TAKE 4 CAPSULES AT BEDTIME 540 capsule 1   levocetirizine (XYZAL) 5 MG tablet TAKE 1 TABLET BY MOUTH EVERY DAY IN THE EVENING 90 tablet 1    levothyroxine (SYNTHROID) 100 MCG tablet Take 1 tablet (100 mcg total) by mouth daily. 30 tablet 3   LINZESS 290 MCG CAPS capsule TAKE 1 CAPSULE BY MOUTH EVERY DAY ON EMPTY STOMACH AT LEAST 30 MINUTES PRIOR TO 1ST MEAL OF THE DAY 90 capsule 1   lisinopril (ZESTRIL) 40 MG tablet TAKE 1 TABLET BY MOUTH EVERY DAY ( NEED MED D) 90 tablet 1   mirtazapine (REMERON) 45 MG tablet Take 1 tablet (45 mg total) by mouth at bedtime. 90 tablet 1   montelukast (SINGULAIR) 10 MG tablet TAKE 1 TABLET BY MOUTH EVERYDAY AT BEDTIME 90 tablet 2   MYRBETRIQ 50 MG TB24 tablet TAKE 1 TABLET BY MOUTH EVERY DAY 90 tablet 1   nitroGLYCERIN (NITRODUR - DOSED IN MG/24 HR) 0.4 mg/hr patch Place 1 patch (0.4 mg total) onto the skin daily. 30 patch 12   pantoprazole (PROTONIX) 40 MG tablet TAKE 1 TABLET BY MOUTH EVERY DAY 90 tablet 1   propranolol ER (INDERAL LA) 80 MG 24 hr capsule TAKE 1 CAPSULE BY MOUTH EVERY DAY 90 capsule 2   risperiDONE (RISPERDAL) 1 MG tablet TAKE 1 TABLET BY MOUTH EVERYDAY AT BEDTIME 90 tablet 1   diazepam (VALIUM) 5 MG tablet Take 1 tablet (5 mg total) by mouth in the morning, at noon, and at bedtime. 90 tablet 5    Allergies No Known Allergies  Review of Systems: Constitutional:  no fevers Eye:  no recent significant change in vision Ears:  No changes in hearing Nose/Mouth/Throat:  no complaints of nasal congestion, no sore throat Cardiovascular: no chest pain Respiratory:  No shortness of breath Gastrointestinal:  No change in bowel habits GU:  Female: negative for dysuria Integumentary:  no new abnormal skin lesions reported Neurologic:  no headaches Endocrine:  denies unexplained weight changes  Exam BP 120/68   Pulse 90   Temp 99 F (37.2 C) (Oral)   Ht 4\' 11"  (1.499 m)   Wt 129 lb 6 oz (58.7 kg)   SpO2 93%   BMI 26.13 kg/m  General:  well developed, well nourished, in no apparent distress Skin: On the dorsum of the fourth digit, there is a hyperkeratotic region with slight  scaling on the border without erythema, fluctuance, drainage, excessive warmth. Head:  no masses, lesions, or tenderness Eyes:  pupils equal and round, sclera anicteric without injection Ears:  canals without lesions, TMs shiny without retraction, no obvious effusion, no erythema Nose:  nares patent, septum midline, mucosa normal, and no drainage or sinus tenderness Throat/Pharynx:  lips and gingiva without lesion; tongue and uvula midline; non-inflamed pharynx; no exudates or postnasal drainage Neck: neck supple without adenopathy, thyromegaly, or masses Lungs:  clear to auscultation, breath sounds  equal bilaterally, no respiratory distress Cardio:  regular rate and rhythm, no bruits or LE edema MSK: No TTP over the lumbar spine region or SI joints Abdomen:  abdomen soft, nontender; bowel sounds normal; no masses or organomegaly Genital: Deferred Neuro:  gait cautious; deep tendon reflexes normal and symmetric, negative straight leg Psych: well oriented with normal range of affect and appropriate judgment/insight  Procedure note: cryotherapy of verruca Verbal consent obtained 1 skin lesion was treated Liquid nitrogen was applied via a thin spray creating an ice ball with 1-2 mm corona surrounding the lesion The patient tolerated the procedure well There were no immediate complications noted  Assessment and Plan  Well adult exam - Plan: Comprehensive metabolic panel, CBC, Lipid panel  Mixed hyperlipidemia  Postoperative hypothyroidism - Plan: TSH, T4, free  Osteoporosis without current pathological fracture, unspecified osteoporosis type - Plan: VITAMIN D 25 Hydroxy (Vit-D Deficiency, Fractures)  Bereavement - Plan: prazosin (MINIPRESS) 5 MG capsule  Tobacco abuse - Plan: Ambulatory Referral Lung Cancer Screening [facility]  [place]ca - Plan: PR DESTRUCTION BENIGN LESIONS UP TO 14  Chronic bilateral low back pain without sciatica - Plan: methylPREDNISolone acetate  (DEPO-MEDROL) injection 80 mg   Well 71 y.o. female. Counseled on diet and exercise. Advanced directive form provided today.  Other orders as above. Verruca: Cryotherapy today.  Continue using aspirin paste daily until resolution. Chronic low back pain: Exacerbation of chronic issue.  Will give a Depo injection today as this has worked well in the past.  She declines physical therapy politely but did offer her a referral if she changes her mind.  Heat, ice, Tylenol. Follow up in 6 mo or prn. The patient voiced understanding and agreement to the plan.  Jilda Roche [place], DO 03/09/22 12:32 PM

## 2022-03-11 ENCOUNTER — Other Ambulatory Visit: Payer: Self-pay | Admitting: Family Medicine

## 2022-03-11 MED ORDER — FENOFIBRATE 145 MG PO TABS: 145.0000 mg | ORAL_TABLET | Freq: Every day | ORAL | 3 refills | Status: AC

## 2022-03-18 ENCOUNTER — Other Ambulatory Visit: Payer: Self-pay | Admitting: Family Medicine

## 2022-04-07 ENCOUNTER — Ambulatory Visit: Payer: PPO

## 2022-04-07 ENCOUNTER — Other Ambulatory Visit: Payer: Self-pay | Admitting: Psychiatry

## 2022-04-07 DIAGNOSIS — F411 Generalized anxiety disorder: Secondary | ICD-10-CM

## 2022-04-07 DIAGNOSIS — F5101 Primary insomnia: Secondary | ICD-10-CM

## 2022-04-08 ENCOUNTER — Encounter: Payer: Self-pay | Admitting: Family Medicine

## 2022-04-08 ENCOUNTER — Ambulatory Visit (INDEPENDENT_AMBULATORY_CARE_PROVIDER_SITE_OTHER): Payer: PPO | Admitting: *Deleted

## 2022-04-08 VITALS — BP 92/62 | HR 67 | Ht 59.0 in | Wt 127.8 lb

## 2022-04-08 DIAGNOSIS — Z23 Encounter for immunization: Secondary | ICD-10-CM | POA: Diagnosis not present

## 2022-04-08 DIAGNOSIS — Z0000 Encounter for general adult medical examination without abnormal findings: Secondary | ICD-10-CM | POA: Diagnosis not present

## 2022-04-08 NOTE — Progress Notes (Signed)
Subjective:   Brenda Dunn is a 71 y.o. female who presents for Medicare Annual (Subsequent) preventive examination.  Review of Systems    Defer to PCP Cardiac Risk Factors include: advanced age (>28men, >82 women);dyslipidemia;hypertension;sedentary lifestyle     Objective:    Today's Vitals   04/08/22 1107  BP: 92/62  Pulse: 67  Weight: 127 lb 12.8 oz (58 kg)  Height: 4\' 11"  (1.499 m)   Body mass index is 25.81 kg/m.     04/08/2022   11:11 AM 03/24/2020    9:04 AM 02/13/2020    9:00 AM 04/13/2019    8:49 AM 02/28/2019    8:32 AM 08/03/2017    6:25 PM 08/03/2017   12:32 PM  Advanced Directives  Does Patient Have a Medical Advance Directive? Yes No No No No No No  Type of Advance Directive Healthcare Power of Attorney        Does patient want to make changes to medical advance directive? No - Patient declined        Copy of Healthcare Power of Attorney in Chart? No - copy requested        Would patient like information on creating a medical advance directive?  No - Patient declined No - Patient declined   No - Patient declined     Current Medications (verified) Outpatient Encounter Medications as of 04/08/2022  Medication Sig   acetaminophen (TYLENOL) 500 MG tablet Take 1,000 mg by mouth every 6 (six) hours as needed for moderate pain.   albuterol (VENTOLIN HFA) 108 (90 Base) MCG/ACT inhaler Inhale 2 puffs into the lungs every 4 (four) hours as needed for wheezing or shortness of breath.   alendronate (FOSAMAX) 70 MG tablet TAKE 1 TABLET BY MOUTH EVERY WEEK WITH FULL GLASS OF WATER ON EMPTY STOMACH   aspirin EC 81 MG tablet Take 1 tablet (81 mg total) by mouth daily.   atorvastatin (LIPITOR) 40 MG tablet TAKE 1 TABLET BY MOUTH EVERYDAY AT BEDTIME   budesonide-formoterol (SYMBICORT) 80-4.5 MCG/ACT inhaler TAKE 2 PUFFS BY MOUTH TWICE A DAY   busPIRone (BUSPAR) 7.5 MG tablet Take 1 tablet (7.5 mg total) by mouth 2 (two) times daily.   Calcium Carbonate-Vitamin D (CALTRATE 600+D)  600-400 MG-UNIT tablet Take 1 tablet by mouth 2 (two) times daily.   clopidogrel (PLAVIX) 75 MG tablet TAKE 1 TABLET BY MOUTH EVERY DAY   diazepam (VALIUM) 5 MG tablet Take 1 tablet (5 mg total) by mouth in the morning, at noon, and at bedtime.   diclofenac Sodium (VOLTAREN) 1 % GEL Apply 4 g topically 4 (four) times daily. To affected joint.   docusate sodium (COLACE) 100 MG capsule Take 100-200 mg by mouth at bedtime.   Dulaglutide (TRULICITY) 3 MG/0.5ML SOPN Inject 3 mg as directed once a week.   esomeprazole (NEXIUM) 40 MG capsule TAKE 1 CAPSULE BY MOUTH EVERY DAY   fenofibrate (TRICOR) 145 MG tablet Take 1 tablet (145 mg total) by mouth daily.   gabapentin (NEURONTIN) 400 MG capsule TAKE 1 CAPSULE BY MOUTH TWICE A DAY AND 4 CAPSULES AT BEDTIME   levocetirizine (XYZAL) 5 MG tablet TAKE 1 TABLET BY MOUTH EVERY DAY IN THE EVENING   levothyroxine (SYNTHROID) 100 MCG tablet TAKE 1 TABLET BY MOUTH EVERY DAY   LINZESS 290 MCG CAPS capsule TAKE 1 CAPSULE BY MOUTH EVERY DAY ON EMPTY STOMACH AT LEAST 30 MINUTES PRIOR TO 1ST MEAL OF THE DAY   lisinopril (ZESTRIL) 40 MG tablet TAKE  1 TABLET BY MOUTH EVERY DAY ( NEED MED D)   mirtazapine (REMERON) 45 MG tablet Take 1 tablet (45 mg total) by mouth at bedtime.   montelukast (SINGULAIR) 10 MG tablet TAKE 1 TABLET BY MOUTH EVERYDAY AT BEDTIME   MYRBETRIQ 50 MG TB24 tablet TAKE 1 TABLET BY MOUTH EVERY DAY   nitroGLYCERIN (NITRODUR - DOSED IN MG/24 HR) 0.4 mg/hr patch Place 1 patch (0.4 mg total) onto the skin daily.   pantoprazole (PROTONIX) 40 MG tablet TAKE 1 TABLET BY MOUTH EVERY DAY   prazosin (MINIPRESS) 1 MG capsule TAKE 2 CAPSULES (2 MG TOTAL) BY MOUTH EVERY DAY AT BEDTIME with the 5 mg capsule for a total of 7 mg nightly.   prazosin (MINIPRESS) 5 MG capsule TAKE 1 CAPSULE BY MOUTH EVERYDAY AT BEDTIME with the 2 mg of the 1 mg prazosin, a total of 7 mg nightly.   propranolol ER (INDERAL LA) 80 MG 24 hr capsule TAKE 1 CAPSULE BY MOUTH EVERY DAY    risperiDONE (RISPERDAL) 1 MG tablet TAKE 1 TABLET BY MOUTH EVERYDAY AT BEDTIME   No facility-administered encounter medications on file as of 04/08/2022.    Allergies (verified) Patient has no known allergies.   History: Past Medical History:  Diagnosis Date   Anxiety    Benzodiazepine dependence (HCC)    Colonic polyp    mulitple polyps. repeat in 05/2019   Constipation    IBS   COPD (chronic obstructive pulmonary disease) (HCC)    Depression    GERD (gastroesophageal reflux disease)    Hyperlipidemia    Hypertension    Insomnia    Migraine headache    OAB (overactive bladder)    Osteoarthritis    Osteoporosis 12/19/2017   Post-surgical hypothyroidism    Pulmonary embolism (HCC)    Eliquis to stop 05/03/19   Scoliosis    Past Surgical History:  Procedure Laterality Date   ABDOMINAL AORTOGRAM W/LOWER EXTREMITY Bilateral 03/24/2020   Procedure: ABDOMINAL AORTOGRAM W/LOWER EXTREMITY;  Surgeon: Nada Libman, MD;  Location: [facility];  Service: Cardiovascular;  Laterality: Bilateral;   BREAST CYST ASPIRATION Left    HEMORRHOID SURGERY     PERIPHERAL VASCULAR INTERVENTION Bilateral 03/24/2020   Procedure: PERIPHERAL VASCULAR INTERVENTION;  Surgeon: Nada Libman, MD;  Location: [facility];  Service: Cardiovascular;  Laterality: Bilateral;  ILIAC STENTS   THYROIDECTOMY     at [facility]   TUBAL LIGATION  1979   Family History  Problem Relation Age of Onset   Breast cancer Mother    Arthritis Mother    Hearing loss Mother    Hyperlipidemia Mother    Hypertension Mother    Anxiety disorder Mother    Lung cancer Maternal Uncle    Anxiety disorder Maternal Uncle    Lung cancer Maternal Grandmother    Heart attack Maternal Grandmother    Anxiety disorder Maternal Grandmother    Asthma Father    COPD Father    Hyperlipidemia Father    Hypertension Father    Alcohol abuse Father    Anxiety disorder Sister    Hypertension Sister    Thyroid cancer Sister     Anxiety disorder Brother    Hypertension Brother    Drug abuse Son    Anxiety disorder Sister    Hypertension Sister    Anxiety disorder Sister    Hypertension Sister    Hyperlipidemia Son    Social History   Socioeconomic History   Marital status:  Married    Spouse name: Not on file   Number of children: Not on file   Years of education: Not on file   Highest education level: Not on file  Occupational History   Occupation: disabled due "my nerves"  Tobacco Use   Smoking status: Every Day    Packs/day: 1.00    Years: 44.00    Total pack years: 44.00    Types: Cigarettes   Smokeless tobacco: Never  Vaping Use   Vaping Use: Former  Substance and Sexual Activity   Alcohol use: No   Drug use: No   Sexual activity: Not on file  Other Topics Concern   Not on file  Social History Narrative   Not on file   Social Determinants of Health   Financial Resource Strain: Low Risk  (02/13/2020)   Overall Financial Resource Strain (CARDIA)    Difficulty of Paying Living Expenses: Not hard at all  Food Insecurity: No Food Insecurity (02/13/2020)   Hunger Vital Sign    Worried About Running Out of Food in the Last Year: Never true    Ran Out of Food in the Last Year: Never true  Transportation Needs: No Transportation Needs (02/13/2020)   PRAPARE - Administrator, Civil Service (Medical): No    Lack of Transportation (Non-Medical): No  Physical Activity: Inactive (04/08/2022)   Exercise Vital Sign    Days of Exercise per Week: 0 days    Minutes of Exercise per Session: 0 min  Stress: No Stress Concern Present (04/08/2022)   [employer] of Occupational Health - Occupational Stress Questionnaire    Feeling of Stress : Not at all  Social Connections: Moderately Isolated (04/08/2022)   Social Connection and Isolation Panel [NHANES]    Frequency of Communication with Friends and Family: More than three times a week    Frequency of Social Gatherings with Friends and  Family: Twice a week    Attends Religious Services: More than 4 times per year    Active Member of [employer] or Organizations: No    Attends Banker Meetings: Never    Marital Status: Widowed    Tobacco Counseling Ready to quit: Not Answered Counseling given: Not Answered   Clinical Intake:  Pre-visit preparation completed: Yes  Pain : No/denies pain     Diabetes: No  How often do you need to have someone help you when you read instructions, pamphlets, or other written materials from your doctor or pharmacy?: 1 - Never  Diabetic? No  Interpreter Needed?: No  Information entered by :: Donne Anon, CMA   Activities of Daily Living    04/08/2022   11:14 AM  In your present state of health, do you have any difficulty performing the following activities:  Hearing? 0  Vision? 0  Difficulty concentrating or making decisions? 0  Walking or climbing stairs? 0  Dressing or bathing? 0  Doing errands, shopping? 0  Preparing Food and eating ? N  Using the Toilet? N  In the past six months, have you accidently leaked urine? N  Comment takes Myrbetriq  Do you have problems with loss of bowel control? N  Managing your Medications? N  Managing your Finances? N  Housekeeping or managing your Housekeeping? N    Patient Care Team: Sharlene Dory, DO as PCP - General (Family Medicine)  Indicate any recent Medical Services you may have received from other than [facility] providers in the past year (date may be approximate).  Assessment:   This is a routine wellness examination for Nadina.  Hearing/Vision screen No results found.  Dietary issues and exercise activities discussed: Current Exercise Habits: The patient does not participate in regular exercise at present, Exercise limited by: None identified   Goals Addressed   None    Depression Screen    04/08/2022   11:14 AM 03/09/2022   10:50 AM 02/16/2021    9:52 AM 02/13/2020    9:00 AM 01/17/2020    11:01 AM 05/02/2017   10:49 AM  PHQ 2/9 Scores  PHQ - 2 Score 0 3 6 1  0 1  PHQ- 9 Score  5 14  0     Fall Risk    04/08/2022   11:12 AM 03/09/2022   10:50 AM 02/16/2021    9:52 AM 02/27/2020   10:04 AM 02/13/2020    9:05 AM  Fall Risk   Falls in the past year? 0 0 0 0 0  Comment    Emmi Telephone Survey: data to providers prior to load   Number falls in past yr: 0 0 0  0  Injury with Fall? 0 0 0  0  Risk for fall due to : No Fall Risks No Fall Risks No Fall Risks    Follow up Falls evaluation completed Falls evaluation completed Falls evaluation completed  Education provided;Falls prevention discussed    FALL RISK PREVENTION PERTAINING TO THE HOME:  Any stairs in or around the home? No  If so, are there any without handrails? No  Home free of loose throw rugs in walkways, pet beds, electrical cords, etc? Yes  Adequate lighting in your home to reduce risk of falls? Yes   ASSISTIVE DEVICES UTILIZED TO PREVENT FALLS:  Life alert? No  Use of a cane, walker or w/c? No  Grab bars in the bathroom? Yes  Shower chair or bench in shower? Yes  Elevated toilet seat or a handicapped toilet? Yes   TIMED UP AND GO:  Was the test performed? Yes .  Length of time to ambulate 10 feet: 8 sec.   Gait steady and fast without use of assistive device  Cognitive Function:        04/08/2022   11:19 AM  6CIT Screen  What Year? 0 points  What month? 0 points  What time? 0 points  Count back from 20 0 points  Months in reverse 0 points  Repeat phrase 4 points  Total Score 4 points    Immunizations Immunization History  Administered Date(s) Administered   Fluad Quad(high Dose 65+) 04/01/2019, 05/20/2020, 04/27/2021   Influenza, High Dose Seasonal PF 05/07/2017   Influenza-Unspecified 04/25/2018   PNEUMOCOCCAL CONJUGATE-20 02/16/2021   Pneumococcal Conjugate-13 09/26/2016, 10/03/2017   Tdap 02/16/2021    TDAP status: Up to date  Flu Vaccine status: Completed at today's  visit  Pneumococcal vaccine status: Up to date  Covid-19 vaccine status: Declined, Education has been provided regarding the importance of this vaccine but patient still declined. Advised may receive this vaccine at local pharmacy or Health Dept.or vaccine clinic. Aware to provide a copy of the vaccination record if obtained from local pharmacy or Health Dept. Verbalized acceptance and understanding.  Qualifies for Shingles Vaccine? Yes   Zostavax completed No   Shingrix Completed?: No.    Education has been provided regarding the importance of this vaccine. Patient has been advised to call insurance company to determine out of pocket expense if they have not yet received this vaccine. Advised  may also receive vaccine at local pharmacy or Health Dept. Verbalized acceptance and understanding.  Screening Tests Health Maintenance  Topic Date Due   INFLUENZA VACCINE  03/01/2022   MAMMOGRAM  03/11/2022   Fecal DNA (Cologuard)  12/23/2022   TETANUS/TDAP  02/17/2031   Pneumonia Vaccine 63+ Years old  Completed   DEXA SCAN  Completed   Hepatitis C Screening  Completed   HPV VACCINES  Aged Out   COLONOSCOPY (Pts 45-41yrs Insurance coverage will need to be confirmed)  Discontinued   COVID-19 Vaccine  Discontinued   Zoster Vaccines- Shingrix  Discontinued    Health Maintenance  Health Maintenance Due  Topic Date Due   INFLUENZA VACCINE  03/01/2022   MAMMOGRAM  03/11/2022    Colorectal cancer screening: Type of screening: Colonoscopy. Completed 05/12/16. Repeat every N/a years  Mammogram status: Ordered 05/17/21. Pt provided with contact info and advised to call to schedule appt.   Bone Density status: Completed 03/11/20. Results reflect: Bone density results: OSTEOPENIA. Repeat every 2 years.  Lung Cancer Screening: (Low Dose CT Chest recommended if Age 92-80 years, 30 pack-year currently smoking OR have quit w/in 15years.) does qualify.   Lung Cancer Screening Referral:  N/a  Additional Screening:  Hepatitis C Screening: does qualify; Completed 02/17/15  Vision Screening: Recommended annual ophthalmology exams for early detection of glaucoma and other disorders of the eye. Is the patient up to date with their annual eye exam?  Yes  Who is the provider or what is the name of the office in which the patient attends annual eye exams? Dr. Burgess Estelle If pt is not established with a provider, would they like to be referred to a provider to establish care? No .   Dental Screening: Recommended annual dental exams for proper oral hygiene  Community Resource Referral / Chronic Care Management: CRR required this visit?  No   CCM required this visit?  No      Plan:     I have personally reviewed and noted the following in the patient's chart:   Medical and social history Use of alcohol, tobacco or illicit drugs  Current medications and supplements including opioid prescriptions. Patient is not currently taking opioid prescriptions. Functional ability and status Nutritional status Physical activity Advanced directives List of other physicians Hospitalizations, surgeries, and ER visits in previous 12 months Vitals Screenings to include cognitive, depression, and falls Referrals and appointments  In addition, I have reviewed and discussed with patient certain preventive protocols, quality metrics, and best practice recommendations. A written personalized care plan for preventive services as well as general preventive health recommendations were provided to patient.     Donne Anon, [place]   04/08/2022   Nurse Notes: None

## 2022-04-08 NOTE — Addendum Note (Signed)
Addended by: Donne Anon L on: 04/08/2022 11:44 AM   Modules accepted: Orders

## 2022-04-08 NOTE — Patient Instructions (Signed)
Brenda Dunn , Thank you for taking time to come for your Medicare Wellness Visit. I appreciate your ongoing commitment to your health goals. Please review the following plan we discussed and let me know if I can assist you in the future.   These are the goals we discussed:  Goals      DIET - INCREASE WATER INTAKE        This is a list of the screening recommended for you and due dates:  Health Maintenance  Topic Date Due   Flu Shot  03/01/2022   Mammogram  03/11/2022   Cologuard (Stool DNA test)  12/23/2022   Tetanus Vaccine  02/17/2031   Pneumonia Vaccine  Completed   DEXA scan (bone density measurement)  Completed   Hepatitis C Screening: USPSTF Recommendation to screen - Ages 82-79 yo.  Completed   HPV Vaccine  Aged Out   Colon Cancer Screening  Discontinued   COVID-19 Vaccine  Discontinued   Zoster (Shingles) Vaccine  Discontinued       Next appointment: Follow up in one year for your annual wellness visit    Preventive Care 65 Years and Older, Female Preventive care refers to lifestyle choices and visits with your health care provider that can promote health and wellness. What does preventive care include? A yearly physical exam. This is also called an annual well check. Dental exams once or twice a year. Routine eye exams. Ask your health care provider how often you should have your eyes checked. Personal lifestyle choices, including: Daily care of your teeth and gums. Regular physical activity. Eating a healthy diet. Avoiding tobacco and drug use. Limiting alcohol use. Practicing safe sex. Taking low-dose aspirin every day. Taking vitamin and mineral supplements as recommended by your health care provider. What happens during an annual well check? The services and screenings done by your health care provider during your annual well check will depend on your age, overall health, lifestyle risk factors, and family history of disease. Counseling  Your health care  provider may ask you questions about your: Alcohol use. Tobacco use. Drug use. Emotional well-being. Home and relationship well-being. Sexual activity. Eating habits. History of falls. Memory and ability to understand (cognition). Work and work Astronomer. Reproductive health. Screening  You may have the following tests or measurements: Height, weight, and BMI. Blood pressure. Lipid and cholesterol levels. These may be checked every 5 years, or more frequently if you are over 61 years old. Skin check. Lung cancer screening. You may have this screening every year starting at age 7 if you have a 30-pack-year history of smoking and currently smoke or have quit within the past 15 years. Fecal occult blood test (FOBT) of the stool. You may have this test every year starting at age 20. Flexible sigmoidoscopy or colonoscopy. You may have a sigmoidoscopy every 5 years or a colonoscopy every 10 years starting at age 52. Hepatitis C blood test. Hepatitis B blood test. Sexually transmitted disease (STD) testing. Diabetes screening. This is done by checking your blood sugar (glucose) after you have not eaten for a while (fasting). You may have this done every 1-3 years. Bone density scan. This is done to screen for osteoporosis. You may have this done starting at age 45. Mammogram. This may be done every 1-2 years. Talk to your health care provider about how often you should have regular mammograms. Talk with your health care provider about your test results, treatment options, and if necessary, the need for more  tests. Vaccines  Your health care provider may recommend certain vaccines, such as: Influenza vaccine. This is recommended every year. Tetanus, diphtheria, and acellular pertussis (Tdap, Td) vaccine. You may need a Td booster every 10 years. Zoster vaccine. You may need this after age 36. Pneumococcal 13-valent conjugate (PCV13) vaccine. One dose is recommended after age  73. Pneumococcal polysaccharide (PPSV23) vaccine. One dose is recommended after age 90. Talk to your health care provider about which screenings and vaccines you need and how often you need them. This information is not intended to replace advice given to you by your health care provider. Make sure you discuss any questions you have with your health care provider. Document Released: 08/14/2015 Document Revised: 04/06/2016 Document Reviewed: 05/19/2015 Elsevier Interactive Patient Education  2017 [place] Prevention in the Home Falls can cause injuries. They can happen to people of all ages. There are many things you can do to make your home safe and to help prevent falls. What can I do on the outside of my home? Regularly fix the edges of walkways and driveways and fix any cracks. Remove anything that might make you trip as you walk through a door, such as a raised step or threshold. Trim any bushes or trees on the path to your home. Use bright outdoor lighting. Clear any walking paths of anything that might make someone trip, such as rocks or tools. Regularly check to see if handrails are loose or broken. Make sure that both sides of any steps have handrails. Any raised decks and porches should have guardrails on the edges. Have any leaves, snow, or ice cleared regularly. Use sand or salt on walking paths during winter. Clean up any spills in your garage right away. This includes oil or grease spills. What can I do in the bathroom? Use night lights. Install grab bars by the toilet and in the tub and shower. Do not use towel bars as grab bars. Use non-skid mats or decals in the tub or shower. If you need to sit down in the shower, use a plastic, non-slip stool. Keep the floor dry. Clean up any water that spills on the floor as soon as it happens. Remove soap buildup in the tub or shower regularly. Attach bath mats securely with double-sided non-slip rug tape. Do not have throw  rugs and other things on the floor that can make you trip. What can I do in the bedroom? Use night lights. Make sure that you have a light by your bed that is easy to reach. Do not use any sheets or blankets that are too big for your bed. They should not hang down onto the floor. Have a firm chair that has side arms. You can use this for support while you get dressed. Do not have throw rugs and other things on the floor that can make you trip. What can I do in the kitchen? Clean up any spills right away. Avoid walking on wet floors. Keep items that you use a lot in easy-to-reach places. If you need to reach something above you, use a strong step stool that has a grab bar. Keep electrical cords out of the way. Do not use floor polish or wax that makes floors slippery. If you must use wax, use non-skid floor wax. Do not have throw rugs and other things on the floor that can make you trip. What can I do with my stairs? Do not leave any items on the stairs. Make sure  that there are handrails on both sides of the stairs and use them. Fix handrails that are broken or loose. Make sure that handrails are as long as the stairways. Check any carpeting to make sure that it is firmly attached to the stairs. Fix any carpet that is loose or worn. Avoid having throw rugs at the top or bottom of the stairs. If you do have throw rugs, attach them to the floor with carpet tape. Make sure that you have a light switch at the top of the stairs and the bottom of the stairs. If you do not have them, ask someone to add them for you. What else can I do to help prevent falls? Wear shoes that: Do not have high heels. Have rubber bottoms. Are comfortable and fit you well. Are closed at the toe. Do not wear sandals. If you use a stepladder: Make sure that it is fully opened. Do not climb a closed stepladder. Make sure that both sides of the stepladder are locked into place. Ask someone to hold it for you, if  possible. Clearly mark and make sure that you can see: Any grab bars or handrails. First and last steps. Where the edge of each step is. Use tools that help you move around (mobility aids) if they are needed. These include: Canes. Walkers. Scooters. Crutches. Turn on the lights when you go into a dark area. Replace any light bulbs as soon as they burn out. Set up your furniture so you have a clear path. Avoid moving your furniture around. If any of your floors are uneven, fix them. If there are any pets around you, be aware of where they are. Review your medicines with your doctor. Some medicines can make you feel dizzy. This can increase your chance of falling. Ask your doctor what other things that you can do to help prevent falls. This information is not intended to replace advice given to you by your health care provider. Make sure you discuss any questions you have with your health care provider. Document Released: 05/14/2009 Document Revised: 12/24/2015 Document Reviewed: 08/22/2014 Elsevier Interactive Patient Education  2017 [employer].

## 2022-04-10 ENCOUNTER — Other Ambulatory Visit: Payer: Self-pay | Admitting: Family Medicine

## 2022-04-29 ENCOUNTER — Encounter: Payer: PPO | Admitting: Family Medicine

## 2022-05-03 ENCOUNTER — Ambulatory Visit: Payer: PPO | Admitting: Psychiatry

## 2022-05-04 ENCOUNTER — Encounter: Payer: Self-pay | Admitting: Psychiatry

## 2022-05-04 ENCOUNTER — Ambulatory Visit (INDEPENDENT_AMBULATORY_CARE_PROVIDER_SITE_OTHER): Payer: PPO | Admitting: Psychiatry

## 2022-05-04 VITALS — Wt 127.0 lb

## 2022-05-04 DIAGNOSIS — F5101 Primary insomnia: Secondary | ICD-10-CM

## 2022-05-04 DIAGNOSIS — F411 Generalized anxiety disorder: Secondary | ICD-10-CM | POA: Diagnosis not present

## 2022-05-04 DIAGNOSIS — F39 Unspecified mood [affective] disorder: Secondary | ICD-10-CM

## 2022-05-04 MED ORDER — MIRTAZAPINE 45 MG PO TABS: 45.0000 mg | ORAL_TABLET | Freq: Every day | ORAL | 1 refills | Status: DC

## 2022-05-04 MED ORDER — GABAPENTIN 400 MG PO CAPS: ORAL_CAPSULE | ORAL | 1 refills | Status: DC

## 2022-05-04 MED ORDER — DIAZEPAM 5 MG PO TABS: 5.0000 mg | ORAL_TABLET | Freq: Three times a day (TID) | ORAL | 5 refills | Status: DC

## 2022-05-04 MED ORDER — RISPERIDONE 1 MG PO TABS: ORAL_TABLET | ORAL | 1 refills | Status: DC

## 2022-05-04 NOTE — Progress Notes (Signed)
Brenda Dunn [id number] 01/28/1951 71 y.o.  Virtual Visit via Telephone Note  I connected with pt on 05/04/22 at 11:30 AM EDT by telephone and verified that I am speaking with the correct person using two identifiers.   I discussed the limitations, risks, security and privacy concerns of performing an evaluation and management service by telephone and the availability of in person appointments. I also discussed with the patient that there may be a patient responsible charge related to this service. The patient expressed understanding and agreed to proceed.   I discussed the assessment and treatment plan with the patient. The patient was provided an opportunity to ask questions and all were answered. The patient agreed with the plan and demonstrated an understanding of the instructions.   The patient was advised to call back or seek an in-person evaluation if the symptoms worsen or if the condition fails to improve as anticipated.  I provided 21 minutes of non-face-to-face time during this encounter.  The patient was located at home.  The provider was located at [facility].   Corie ChiquitoJessica Reese Senk, PMHNP   Subjective:   Patient ID:  Brenda Dunn is a 71 y.o. (DOB 11/21/1950) female.  Chief Complaint:  Chief Complaint  Patient presents with   Follow-up    Anxiety, depression, and insomnia    HPI Brenda Dunn presents for follow-up of anxiety, depression, and insomnia. She reports,  "I've been doing good." She reports that her anxiety has been ok and consistent with baseline. She reports that her medications are helpful for her anxiety and insomnia. She reports that she goes to bed at 9 pm and sleeps through the night until 6 am. She reports sad mood at times and will miss her husband. She continues to go out to eat periodically with her sister. She denies persistent irritability. Energy and motivation have been good. Appetite has been good and she reports that her weight has been  stable. She reports long-standing difficulty with concentration since junior high school. Denies anhedonia. Enjoys time with sister and watching TV. Denies SI.   Son lives in [place] and he does not call her as much as she would like.   She reports that she watches TV.   Denies tremors or involuntary movements. She reports that she had tremors and involuntary movements before starting med from PCP.   She reports that she did not sleep for several days after running out of Gabapentin.   Diazepam was last filled 04/15/22 x 6. Gabapentin filled 04/11/22 for 90 day supply.   Past Psychiatric Medication Trials: Remeron- Had worsening depression and insomnia with 30 mg dose. Olanzapine Seroquel  Risperdal-helpful for mood, anxiety, and insomnia Diazepam Gabapentin Trazodone Trileptal- hyponatremia Ambien  Review of Systems:  Review of Systems  Gastrointestinal:  Positive for nausea.  Musculoskeletal:  Negative for gait problem.  Skin:        Reports wart on her toe  Neurological:  Negative for tremors.  Psychiatric/Behavioral:         Please refer to HPI    Medications: I have reviewed the patient's current medications.  Current Outpatient Medications  Medication Sig Dispense Refill   acetaminophen (TYLENOL) 500 MG tablet Take 1,000 mg by mouth every 6 (six) hours as needed for moderate pain.     albuterol (VENTOLIN HFA) 108 (90 Base) MCG/ACT inhaler Inhale 2 puffs into the lungs every 4 (four) hours as needed for wheezing or shortness of breath. 2 each 3   alendronate (  FOSAMAX) 70 MG tablet TAKE 1 TABLET BY MOUTH EVERY WEEK WITH FULL GLASS OF WATER ON EMPTY STOMACH 12 tablet 3   aspirin EC 81 MG tablet Take 1 tablet (81 mg total) by mouth daily. 150 tablet 2   atorvastatin (LIPITOR) 40 MG tablet TAKE 1 TABLET BY MOUTH EVERYDAY AT BEDTIME 90 tablet 1   budesonide-formoterol (SYMBICORT) 80-4.5 MCG/ACT inhaler TAKE 2 PUFFS BY MOUTH TWICE A DAY 30.6 each 1   busPIRone (BUSPAR)  7.5 MG tablet Take 1 tablet (7.5 mg total) by mouth 2 (two) times daily. 180 tablet 2   Calcium Carbonate-Vitamin D (CALTRATE 600+D) 600-400 MG-UNIT tablet Take 1 tablet by mouth 2 (two) times daily. 60 tablet 11   clopidogrel (PLAVIX) 75 MG tablet TAKE 1 TABLET BY MOUTH EVERY DAY 30 tablet 0   docusate sodium (COLACE) 100 MG capsule Take 100-200 mg by mouth at bedtime.     Dulaglutide (TRULICITY) 3 MG/0.5ML SOPN Inject 3 mg as directed once a week. 2 mL 5   esomeprazole (NEXIUM) 40 MG capsule TAKE 1 CAPSULE BY MOUTH EVERY DAY 90 capsule 1   fenofibrate (TRICOR) 145 MG tablet Take 1 tablet (145 mg total) by mouth daily. 90 tablet 3   levothyroxine (SYNTHROID) 100 MCG tablet TAKE 1 TABLET BY MOUTH EVERY DAY 90 tablet 1   LINZESS 290 MCG CAPS capsule TAKE 1 CAPSULE BY MOUTH EVERY DAY ON EMPTY STOMACH AT LEAST 30 MINUTES PRIOR TO 1ST MEAL OF THE DAY 90 capsule 1   lisinopril (ZESTRIL) 40 MG tablet TAKE 1 TABLET BY MOUTH EVERY DAY ( NEED MED D) 90 tablet 1   MYRBETRIQ 50 MG TB24 tablet TAKE 1 TABLET BY MOUTH EVERY DAY 90 tablet 1   pantoprazole (PROTONIX) 40 MG tablet TAKE 1 TABLET BY MOUTH EVERY DAY 90 tablet 1   prazosin (MINIPRESS) 1 MG capsule TAKE 2 CAPSULES (2 MG TOTAL) BY MOUTH EVERY DAY AT BEDTIME with the 5 mg capsule for a total of 7 mg nightly. 180 capsule 1   prazosin (MINIPRESS) 5 MG capsule TAKE 1 CAPSULE BY MOUTH EVERYDAY AT BEDTIME with the 2 mg of the 1 mg prazosin, a total of 7 mg nightly. 90 capsule 1   propranolol ER (INDERAL LA) 80 MG 24 hr capsule TAKE 1 CAPSULE BY MOUTH EVERY DAY 90 capsule 2   [START ON 05/13/2022] diazepam (VALIUM) 5 MG tablet Take 1 tablet (5 mg total) by mouth in the morning, at noon, and at bedtime. 90 tablet 5   diclofenac Sodium (VOLTAREN) 1 % GEL Apply 4 g topically 4 (four) times daily. To affected joint. (Patient not taking: Reported on 05/04/2022) 100 g 11   [START ON 07/04/2022] gabapentin (NEURONTIN) 400 MG capsule TAKE 1 CAPSULE BY MOUTH TWICE A DAY  AND 4 CAPSULES AT BEDTIME 540 capsule 1   levocetirizine (XYZAL) 5 MG tablet TAKE 1 TABLET BY MOUTH EVERY DAY IN THE EVENING (Patient not taking: Reported on 05/04/2022) 90 tablet 1   mirtazapine (REMERON) 45 MG tablet Take 1 tablet (45 mg total) by mouth at bedtime. 90 tablet 1   montelukast (SINGULAIR) 10 MG tablet TAKE 1 TABLET BY MOUTH EVERYDAY AT BEDTIME (Patient not taking: Reported on 05/04/2022) 90 tablet 2   nitroGLYCERIN (NITRODUR - DOSED IN MG/24 HR) 0.4 mg/hr patch Place 1 patch (0.4 mg total) onto the skin daily. (Patient not taking: Reported on 05/04/2022) 30 patch 12   risperiDONE (RISPERDAL) 1 MG tablet TAKE 1 TABLET BY MOUTH EVERYDAY AT  BEDTIME 90 tablet 1   No current facility-administered medications for this visit.    Medication Side Effects: None  Allergies: No Known Allergies  Past Medical History:  Diagnosis Date   Anxiety    Benzodiazepine dependence (HCC)    Colonic polyp    mulitple polyps. repeat in 05/2019   Constipation    IBS   COPD (chronic obstructive pulmonary disease) (HCC)    Depression    GERD (gastroesophageal reflux disease)    Hyperlipidemia    Hypertension    Insomnia    Migraine headache    OAB (overactive bladder)    Osteoarthritis    Osteoporosis 12/19/2017   Post-surgical hypothyroidism    Pulmonary embolism (HCC)    Eliquis to stop 05/03/19   Scoliosis     Family History  Problem Relation Age of Onset   Breast cancer Mother    Arthritis Mother    Hearing loss Mother    Hyperlipidemia Mother    Hypertension Mother    Anxiety disorder Mother    Lung cancer Maternal Uncle    Anxiety disorder Maternal Uncle    Lung cancer Maternal Grandmother    Heart attack Maternal Grandmother    Anxiety disorder Maternal Grandmother    Asthma Father    COPD Father    Hyperlipidemia Father    Hypertension Father    Alcohol abuse Father    Anxiety disorder Sister    Hypertension Sister    Thyroid cancer Sister    Anxiety disorder Brother     Hypertension Brother    Drug abuse Son    Anxiety disorder Sister    Hypertension Sister    Anxiety disorder Sister    Hypertension Sister    Hyperlipidemia Son     Social History   Socioeconomic History   Marital status: Married    Spouse name: Not on file   Number of children: Not on file   Years of education: Not on file   Highest education level: Not on file  Occupational History   Occupation: disabled due "my nerves"  Tobacco Use   Smoking status: Every Day    Packs/day: 1.00    Years: 44.00    Total pack years: 44.00    Types: Cigarettes   Smokeless tobacco: Never  Vaping Use   Vaping Use: Former  Substance and Sexual Activity   Alcohol use: No   Drug use: No   Sexual activity: Not on file  Other Topics Concern   Not on file  Social History Narrative   Not on file   Social Determinants of Health   Financial Resource Strain: Low Risk  (02/13/2020)   Overall Financial Resource Strain (CARDIA)    Difficulty of Paying Living Expenses: Not hard at all  Food Insecurity: No Food Insecurity (02/13/2020)   Hunger Vital Sign    Worried About Running Out of Food in the Last Year: Never true    Ran Out of Food in the Last Year: Never true  Transportation Needs: No Transportation Needs (02/13/2020)   PRAPARE - Administrator, Civil Service (Medical): No    Lack of Transportation (Non-Medical): No  Physical Activity: Inactive (04/08/2022)   Exercise Vital Sign    Days of Exercise per Week: 0 days    Minutes of Exercise per Session: 0 min  Stress: No Stress Concern Present (04/08/2022)   [employer] of Occupational Health - Occupational Stress Questionnaire    Feeling of Stress : Not at all  Social Connections: Moderately Isolated (04/08/2022)   Social Connection and Isolation Panel [NHANES]    Frequency of Communication with Friends and Family: More than three times a week    Frequency of Social Gatherings with Friends and Family: Twice a week     Attends Religious Services: More than 4 times per year    Active Member of [employer] or Organizations: No    Attends Archivist Meetings: Never    Marital Status: Widowed  Intimate Partner Violence: Not on file    Past Medical History, Surgical history, Social history, and Family history were reviewed and updated as appropriate.   Please see review of systems for further details on the patient's review from today.   Objective:   Physical Exam:  Wt 127 lb (57.6 kg)   BMI 25.65 kg/m   Physical Exam Neurological:     Mental Status: She is alert and oriented to person, place, and time.     Cranial Nerves: No dysarthria.  Psychiatric:        Attention and Perception: Attention and perception normal.        Mood and Affect: Mood normal.        Speech: Speech normal.        Behavior: Behavior is cooperative.        Thought Content: Thought content normal. Thought content is not paranoid or delusional. Thought content does not include homicidal or suicidal ideation. Thought content does not include homicidal or suicidal plan.        Cognition and Memory: Cognition and memory normal.        Judgment: Judgment normal.     Comments: Insight intact     Lab Review:     Component Value Date/Time   NA 139 03/09/2022 1127   NA 140 10/02/2017 1700   K 3.8 03/09/2022 1127   CL 103 03/09/2022 1127   CO2 23 03/09/2022 1127   GLUCOSE 77 03/09/2022 1127   BUN 14 03/09/2022 1127   BUN 11 10/02/2017 1700   CREATININE 0.81 03/09/2022 1127   CREATININE 0.98 05/18/2017 1426   CALCIUM 10.2 03/09/2022 1127   PROT 7.3 03/09/2022 1127   ALBUMIN 4.9 03/09/2022 1127   AST 18 03/09/2022 1127   ALT 16 03/09/2022 1127   ALKPHOS 57 03/09/2022 1127   BILITOT 0.5 03/09/2022 1127   GFRNONAA 56 (L) 04/13/2019 0915   GFRNONAA 88 05/10/2017 1023   GFRAA >60 04/13/2019 0915   GFRAA 102 05/10/2017 1023       Component Value Date/Time   WBC 6.0 03/09/2022 1127   RBC 4.21 03/09/2022 1127   HGB  13.8 03/09/2022 1127   HGB 11.6 08/28/2017 1142   HCT 41.4 03/09/2022 1127   HCT 33.2 (L) 08/28/2017 1142   PLT 166.0 03/09/2022 1127   PLT 222 08/28/2017 1142   MCV 98.2 03/09/2022 1127   MCV 88 08/28/2017 1142   MCH 32.4 04/13/2019 0915   MCHC 33.4 03/09/2022 1127   RDW 12.9 03/09/2022 1127   RDW 13.4 08/28/2017 1142   LYMPHSABS 1.7 04/13/2019 0915   LYMPHSABS 1.7 08/28/2017 1142   MONOABS 0.4 04/13/2019 0915   EOSABS 0.5 04/13/2019 0915   EOSABS 0.1 08/28/2017 1142   BASOSABS 0.0 04/13/2019 0915   BASOSABS 0.0 08/28/2017 1142    No results found for: "POCLITH", "LITHIUM"   No results found for: "PHENYTOIN", "PHENOBARB", "VALPROATE", "CBMZ"   .res Assessment: Plan:    Will continue current plan of care since target signs  and symptoms are well controlled without any tolerability issues. She reports that she does not want to make any changes to current medications since anxiety and insomnia have significantly improved.  Will continue Diazepam  5 mg in the morning, noon, and at bedtime for anxiety.  Continue Gabapentin 400 mg BID and 1200 mg po QHS for anxiety and insomnia. She reports that she has been unable to sleep when she has run out of Gabapentin.  Continue Remeron 45 mg po QHS for depression, anxiety, and insomnia.  Continue Risperidone 1 mg po QHS since this has been helpful for her mood, anxiety, and insomnia. Labs results reviewed at time of exam.  Pt to follow-up in 6 months or sooner if clinically indicated.  Patient advised to contact office with any questions, adverse effects, or acute worsening in signs and symptoms.   Brenda Dunn was seen today for follow-up.  Diagnoses and all orders for this visit:  Generalized anxiety disorder -     diazepam (VALIUM) 5 MG tablet; Take 1 tablet (5 mg total) by mouth in the morning, at noon, and at bedtime. -     gabapentin (NEURONTIN) 400 MG capsule; TAKE 1 CAPSULE BY MOUTH TWICE A DAY AND 4 CAPSULES AT BEDTIME -      mirtazapine (REMERON) 45 MG tablet; Take 1 tablet (45 mg total) by mouth at bedtime.  Primary insomnia -     diazepam (VALIUM) 5 MG tablet; Take 1 tablet (5 mg total) by mouth in the morning, at noon, and at bedtime. -     gabapentin (NEURONTIN) 400 MG capsule; TAKE 1 CAPSULE BY MOUTH TWICE A DAY AND 4 CAPSULES AT BEDTIME -     mirtazapine (REMERON) 45 MG tablet; Take 1 tablet (45 mg total) by mouth at bedtime.  Episodic mood disorder (HCC) -     mirtazapine (REMERON) 45 MG tablet; Take 1 tablet (45 mg total) by mouth at bedtime. -     risperiDONE (RISPERDAL) 1 MG tablet; TAKE 1 TABLET BY MOUTH EVERYDAY AT BEDTIME    Please see After Visit Summary for patient specific instructions.  Future Appointments  Date Time Provider Department Center  09/12/2022 11:00 AM [name], Jilda Roche, DO [id number]    No orders of the defined types were placed in this encounter.     -------------------------------

## 2022-05-13 ENCOUNTER — Other Ambulatory Visit: Payer: Self-pay | Admitting: Family Medicine

## 2022-09-12 ENCOUNTER — Ambulatory Visit: Payer: PPO | Admitting: Family Medicine

## 2022-09-21 ENCOUNTER — Other Ambulatory Visit: Payer: Self-pay | Admitting: Family Medicine

## 2022-09-21 DIAGNOSIS — Z634 Disappearance and death of family member: Secondary | ICD-10-CM

## 2022-09-23 ENCOUNTER — Other Ambulatory Visit: Payer: Self-pay | Admitting: Family Medicine

## 2022-09-26 ENCOUNTER — Other Ambulatory Visit: Payer: Self-pay | Admitting: Family Medicine

## 2022-09-26 ENCOUNTER — Telehealth: Payer: Self-pay

## 2022-09-26 ENCOUNTER — Telehealth: Payer: Self-pay | Admitting: Family Medicine

## 2022-09-26 MED ORDER — DULERA 100-5 MCG/ACT IN AERO: 2.0000 | INHALATION_SPRAY | Freq: Two times a day (BID) | RESPIRATORY_TRACT | 5 refills | Status: DC

## 2022-09-26 NOTE — Telephone Encounter (Signed)
Patient would like a call back to discuss her symbicort please.

## 2022-09-26 NOTE — Telephone Encounter (Signed)
Patient informed inhaler sent in.

## 2022-09-26 NOTE — Addendum Note (Signed)
Addended by: Ames Coupe on: 09/26/2022 04:37 PM   Modules accepted: Orders

## 2022-09-26 NOTE — Telephone Encounter (Signed)
Pt called to advise that insurance will cover albuterol. They will cover another medication as well but pt says that one costs $47 and she is not willing to pay that much. Please call her back to discuss.

## 2022-09-26 NOTE — Telephone Encounter (Signed)
PA initiated via Covermymeds; [id number]. Awaiting determination.

## 2022-09-26 NOTE — Telephone Encounter (Signed)
The cost of the other one was $47 to $60 and she cannot afford this

## 2022-09-26 NOTE — Telephone Encounter (Signed)
Brenda Dunn looks like it will be cheaper so let's do that.

## 2022-09-26 NOTE — Telephone Encounter (Signed)
Her insurance will no longer pay for Symbicort Informer her to call her insurance to find out what they will cover.  She agreed to do so

## 2022-09-27 NOTE — Telephone Encounter (Signed)
Pt states ins denied Dulera.

## 2022-09-27 NOTE — Telephone Encounter (Signed)
Have not received a determination via Covermymeds yet.

## 2022-10-02 ENCOUNTER — Other Ambulatory Visit: Payer: Self-pay | Admitting: Family Medicine

## 2022-10-05 MED ORDER — FLUTICASONE FUROATE-VILANTEROL 200-25 MCG/ACT IN AEPB: 1.0000 | INHALATION_SPRAY | Freq: Every day | RESPIRATORY_TRACT | 11 refills | Status: DC

## 2022-10-05 NOTE — Telephone Encounter (Signed)
Called [employer] at [phone]- confirmed PA was denied. Dulera only approved for Pt's w/ hx of asthma and has tried and failed [employer].

## 2022-10-31 ENCOUNTER — Ambulatory Visit (INDEPENDENT_AMBULATORY_CARE_PROVIDER_SITE_OTHER): Payer: PPO | Admitting: Psychiatry

## 2022-10-31 ENCOUNTER — Encounter: Payer: Self-pay | Admitting: Psychiatry

## 2022-10-31 DIAGNOSIS — F411 Generalized anxiety disorder: Secondary | ICD-10-CM

## 2022-10-31 DIAGNOSIS — F39 Unspecified mood [affective] disorder: Secondary | ICD-10-CM

## 2022-10-31 DIAGNOSIS — F5101 Primary insomnia: Secondary | ICD-10-CM

## 2022-10-31 DIAGNOSIS — Z720 Tobacco use: Secondary | ICD-10-CM

## 2022-10-31 MED ORDER — BUPROPION HCL ER (XL) 150 MG PO TB24: 150.0000 mg | ORAL_TABLET | Freq: Every day | ORAL | 1 refills | Status: DC

## 2022-10-31 MED ORDER — RISPERIDONE 1 MG PO TABS: ORAL_TABLET | ORAL | 1 refills | Status: DC

## 2022-10-31 MED ORDER — BUSPIRONE HCL 7.5 MG PO TABS: 7.5000 mg | ORAL_TABLET | Freq: Two times a day (BID) | ORAL | 2 refills | Status: DC

## 2022-10-31 MED ORDER — DIAZEPAM 5 MG PO TABS: 5.0000 mg | ORAL_TABLET | Freq: Three times a day (TID) | ORAL | 5 refills | Status: DC

## 2022-10-31 MED ORDER — MIRTAZAPINE 45 MG PO TABS: 45.0000 mg | ORAL_TABLET | Freq: Every day | ORAL | 1 refills | Status: DC

## 2022-10-31 NOTE — Progress Notes (Signed)
LERLENE Dunn [id number] 06/03/1951 72 y.o.  Virtual Visit via Telephone Note  I connected with pt on 10/31/22 at 10:00 AM EDT by telephone and verified that I am speaking with the correct person using two identifiers.   I discussed the limitations, risks, security and privacy concerns of performing an evaluation and management service by telephone and the availability of in person appointments. I also discussed with the patient that there may be a patient responsible charge related to this service. The patient expressed understanding and agreed to proceed.   I discussed the assessment and treatment plan with the patient. The patient was provided an opportunity to ask questions and all were answered. The patient agreed with the plan and demonstrated an understanding of the instructions.   The patient was advised to call back or seek an in-person evaluation if the symptoms worsen or if the condition fails to improve as anticipated.  I provided 28 minutes of non-face-to-face time during this encounter.  The patient was located at home.  The provider was located at home.   Thayer Headings, PMHNP   Subjective:   Patient ID:  Brenda Dunn is a 72 y.o. (DOB November 05, 1950) female.  Chief Complaint:  Chief Complaint  Patient presents with   Nicotine Dependence   Follow-up    Anxiety, insomnia, and mood disturbance    HPI Brenda Dunn presents for follow-up of anxiety, insomnia, and mood disturbance.  "It's been good." She reports that her anxiety has been "up a little bit because I have been thinking about quitting smoking." She reports that she has tried to Nicorette and Nicoderm in the past without success. She has been going longer without a cigarette (1-2 hours). She reports that her mood has been "good." Denies depressed mood or irritability." Denies recent panic attacks. Sleeping well with medication. Energy and motivation have been good. Meets twin sister twice a week to eat. Appetite has  been good. She reports long-standing difficult with concentration. She reports that she can only read 1-2 paragraph at a time. Denies SI- "I want to live as long as I can live."   March 28th was birthday of oldest son that is passed away.   Gabapentin last filled 10/06/22. Diazepam last filled 10/03/22.  Past Psychiatric Medication Trials: Remeron- Had worsening depression and insomnia with 30 mg dose. Olanzapine Seroquel  Risperdal-helpful for mood, anxiety, and insomnia Diazepam Gabapentin Trazodone Trileptal- hyponatremia Ambien  Review of Systems:  Review of Systems  Respiratory:  Positive for cough.   Musculoskeletal:  Negative for gait problem.  Neurological:        She reports long-standing hand tremor.  Psychiatric/Behavioral:         Please refer to HPI    Medications: I have reviewed the patient's current medications.  Current Outpatient Medications  Medication Sig Dispense Refill   atorvastatin (LIPITOR) 40 MG tablet TAKE 1 TABLET BY MOUTH EVERYDAY AT BEDTIME 90 tablet 1   buPROPion (WELLBUTRIN XL) 150 MG 24 hr tablet Take 1 tablet (150 mg total) by mouth daily. 90 tablet 1   docusate sodium (COLACE) 100 MG capsule Take 100-200 mg by mouth at bedtime.     Dulaglutide (TRULICITY) 3 0000000 SOPN Inject 3 mg as directed once a week. 2 mL 5   esomeprazole (NEXIUM) 40 MG capsule TAKE 1 CAPSULE BY MOUTH EVERY DAY 90 capsule 1   fenofibrate (TRICOR) 145 MG tablet Take 1 tablet (145 mg total) by mouth daily. 90 tablet 3  fluticasone furoate-vilanterol (BREO ELLIPTA) 200-25 MCG/ACT AEPB Inhale 1 puff into the lungs daily. 1 each 11   gabapentin (NEURONTIN) 400 MG capsule TAKE 1 CAPSULE BY MOUTH TWICE A DAY AND 4 CAPSULES AT BEDTIME 540 capsule 1   levothyroxine (SYNTHROID) 100 MCG tablet TAKE 1 TABLET BY MOUTH EVERY DAY 90 tablet 1   lisinopril (ZESTRIL) 40 MG tablet TAKE 1 TABLET BY MOUTH EVERY DAY ( NEED MED D) 90 tablet 1   MYRBETRIQ 50 MG TB24 tablet TAKE 1 TABLET BY  MOUTH EVERY DAY 90 tablet 1   pantoprazole (PROTONIX) 40 MG tablet TAKE 1 TABLET BY MOUTH EVERY DAY 90 tablet 1   prazosin (MINIPRESS) 1 MG capsule TAKE 2 CAPSULES BY MOUTH EVERY DAY AT BEDTIME WITH THE 5 MG CAPSULE FOR A TOTAL OF 7 MG NIGHTLY. 180 capsule 1   prazosin (MINIPRESS) 5 MG capsule TAKE 1 CAPSULE BY MOUTH EVERYDAY AT BEDTIME WITH 1 MG PRAZOSIN, A TOTAL OF 7 MG NIGHTLY. 90 capsule 1   acetaminophen (TYLENOL) 500 MG tablet Take 1,000 mg by mouth every 6 (six) hours as needed for moderate pain.     albuterol (VENTOLIN HFA) 108 (90 Base) MCG/ACT inhaler Inhale 2 puffs into the lungs every 4 (four) hours as needed for wheezing or shortness of breath. 2 each 3   alendronate (FOSAMAX) 70 MG tablet TAKE 1 TABLET BY MOUTH EVERY WEEK WITH FULL GLASS OF WATER ON EMPTY STOMACH (Patient not taking: Reported on 10/31/2022) 12 tablet 3   aspirin EC 81 MG tablet Take 1 tablet (81 mg total) by mouth daily. (Patient not taking: Reported on 10/31/2022) 150 tablet 2   busPIRone (BUSPAR) 7.5 MG tablet Take 1 tablet (7.5 mg total) by mouth 2 (two) times daily. 180 tablet 2   Calcium Carbonate-Vitamin D (CALTRATE 600+D) 600-400 MG-UNIT tablet Take 1 tablet by mouth 2 (two) times daily. (Patient not taking: Reported on 10/31/2022) 60 tablet 11   clopidogrel (PLAVIX) 75 MG tablet TAKE 1 TABLET BY MOUTH EVERY DAY (Patient not taking: Reported on 10/31/2022) 30 tablet 0   diazepam (VALIUM) 5 MG tablet Take 1 tablet (5 mg total) by mouth in the morning, at noon, and at bedtime. 90 tablet 5   diclofenac Sodium (VOLTAREN) 1 % GEL Apply 4 g topically 4 (four) times daily. To affected joint. (Patient not taking: Reported on 05/04/2022) 100 g 11   levocetirizine (XYZAL) 5 MG tablet TAKE 1 TABLET BY MOUTH EVERY DAY IN THE EVENING (Patient not taking: Reported on 05/04/2022) 90 tablet 1   LINZESS 290 MCG CAPS capsule TAKE 1 CAPSULE BY MOUTH EVERY DAY ON EMPTY STOMACH AT LEAST 30 MINUTES PRIOR TO 1ST MEAL OF THE DAY 90 capsule 1    mirtazapine (REMERON) 45 MG tablet Take 1 tablet (45 mg total) by mouth at bedtime. 90 tablet 1   montelukast (SINGULAIR) 10 MG tablet TAKE 1 TABLET BY MOUTH EVERYDAY AT BEDTIME (Patient not taking: Reported on 05/04/2022) 90 tablet 2   nitroGLYCERIN (NITRODUR - DOSED IN MG/24 HR) 0.4 mg/hr patch Place 1 patch (0.4 mg total) onto the skin daily. (Patient not taking: Reported on 05/04/2022) 30 patch 12   propranolol ER (INDERAL LA) 80 MG 24 hr capsule TAKE 1 CAPSULE BY MOUTH EVERY DAY (Patient not taking: Reported on 10/31/2022) 90 capsule 2   risperiDONE (RISPERDAL) 1 MG tablet TAKE 1 TABLET BY MOUTH EVERYDAY AT BEDTIME 90 tablet 1   No current facility-administered medications for this visit.    Medication  Side Effects: None  Allergies: No Known Allergies  Past Medical History:  Diagnosis Date   Anxiety    Benzodiazepine dependence    Colonic polyp    mulitple polyps. repeat in 05/2019   Constipation    IBS   COPD (chronic obstructive pulmonary disease)    Depression    GERD (gastroesophageal reflux disease)    Hyperlipidemia    Hypertension    Insomnia    Migraine headache    OAB (overactive bladder)    Osteoarthritis    Osteoporosis 12/19/2017   Post-surgical hypothyroidism    Pulmonary embolism    Eliquis to stop 05/03/19   Scoliosis     Family History  Problem Relation Age of Onset   Breast cancer Mother    Arthritis Mother    Hearing loss Mother    Hyperlipidemia Mother    Hypertension Mother    Anxiety disorder Mother    Lung cancer Maternal Uncle    Anxiety disorder Maternal Uncle    Lung cancer Maternal Grandmother    Heart attack Maternal Grandmother    Anxiety disorder Maternal Grandmother    Asthma Father    COPD Father    Hyperlipidemia Father    Hypertension Father    Alcohol abuse Father    Anxiety disorder Sister    Hypertension Sister    Thyroid cancer Sister    Anxiety disorder Brother    Hypertension Brother    Drug abuse Son    Anxiety  disorder Sister    Hypertension Sister    Anxiety disorder Sister    Hypertension Sister    Hyperlipidemia Son     Social History   Socioeconomic History   Marital status: Married    Spouse name: Not on file   Number of children: Not on file   Years of education: Not on file   Highest education level: Not on file  Occupational History   Occupation: disabled due "my nerves"  Tobacco Use   Smoking status: Every Day    Packs/day: 1.00    Years: 44.00    Additional pack years: 0.00    Total pack years: 44.00    Types: Cigarettes   Smokeless tobacco: Never  Vaping Use   Vaping Use: Former  Substance and Sexual Activity   Alcohol use: No   Drug use: No   Sexual activity: Not on file  Other Topics Concern   Not on file  Social History Narrative   Not on file   Social Determinants of Health   Financial Resource Strain: Low Risk  (02/13/2020)   Overall Financial Resource Strain (CARDIA)    Difficulty of Paying Living Expenses: Not hard at all  Food Insecurity: No Food Insecurity (02/13/2020)   Hunger Vital Sign    Worried About Running Out of Food in the Last Year: Never true    Ran Out of Food in the Last Year: Never true  Transportation Needs: No Transportation Needs (02/13/2020)   PRAPARE - Hydrologist (Medical): No    Lack of Transportation (Non-Medical): No  Physical Activity: Inactive (04/08/2022)   Exercise Vital Sign    Days of Exercise per Week: 0 days    Minutes of Exercise per Session: 0 min  Stress: No Stress Concern Present (04/08/2022)   [place]    Feeling of Stress : Not at all  Social Connections: Moderately Isolated (04/08/2022)   Social Connection and Isolation Panel [  NHANES]    Frequency of Communication with Friends and Family: More than three times a week    Frequency of Social Gatherings with Friends and Family: Twice a week    Attends Religious Services:  More than 4 times per year    Active Member of [employer] or Organizations: No    Attends Archivist Meetings: Never    Marital Status: Widowed  Intimate Partner Violence: Not on file    Past Medical History, Surgical history, Social history, and Family history were reviewed and updated as appropriate.   Please see review of systems for further details on the patient's review from today.   Objective:   Physical Exam:  There were no vitals taken for this visit.  Physical Exam Neurological:     Mental Status: She is alert and oriented to person, place, and time.     Cranial Nerves: No dysarthria.  Psychiatric:        Attention and Perception: Attention and perception normal.        Mood and Affect: Mood normal.        Speech: Speech normal.        Behavior: Behavior is cooperative.        Thought Content: Thought content normal. Thought content is not paranoid or delusional. Thought content does not include homicidal or suicidal ideation. Thought content does not include homicidal or suicidal plan.        Cognition and Memory: Cognition and memory normal.        Judgment: Judgment normal.     Comments: Insight intact     Lab Review:     Component Value Date/Time   NA 139 03/09/2022 1127   NA 140 10/02/2017 1700   K 3.8 03/09/2022 1127   CL 103 03/09/2022 1127   CO2 23 03/09/2022 1127   GLUCOSE 77 03/09/2022 1127   BUN 14 03/09/2022 1127   BUN 11 10/02/2017 1700   CREATININE 0.81 03/09/2022 1127   CREATININE 0.98 05/18/2017 1426   CALCIUM 10.2 03/09/2022 1127   PROT 7.3 03/09/2022 1127   ALBUMIN 4.9 03/09/2022 1127   AST 18 03/09/2022 1127   ALT 16 03/09/2022 1127   ALKPHOS 57 03/09/2022 1127   BILITOT 0.5 03/09/2022 1127   GFRNONAA 56 (L) 04/13/2019 0915   GFRNONAA 88 05/10/2017 1023   GFRAA >60 04/13/2019 0915   GFRAA 102 05/10/2017 1023       Component Value Date/Time   WBC 6.0 03/09/2022 1127   RBC 4.21 03/09/2022 1127   HGB 13.8 03/09/2022 1127    HGB 11.6 08/28/2017 1142   HCT 41.4 03/09/2022 1127   HCT 33.2 (L) 08/28/2017 1142   PLT 166.0 03/09/2022 1127   PLT 222 08/28/2017 1142   MCV 98.2 03/09/2022 1127   MCV 88 08/28/2017 1142   MCH 32.4 04/13/2019 0915   MCHC 33.4 03/09/2022 1127   RDW 12.9 03/09/2022 1127   RDW 13.4 08/28/2017 1142   LYMPHSABS 1.7 04/13/2019 0915   LYMPHSABS 1.7 08/28/2017 1142   MONOABS 0.4 04/13/2019 0915   EOSABS 0.5 04/13/2019 0915   EOSABS 0.1 08/28/2017 1142   BASOSABS 0.0 04/13/2019 0915   BASOSABS 0.0 08/28/2017 1142    No results found for: "POCLITH", "LITHIUM"   No results found for: "PHENYTOIN", "PHENOBARB", "VALPROATE", "CBMZ"   .res Assessment: Plan:    Pt seen for 28 minutes and time spent discussing her desire to stop smoking and different treatment options for smoking cessation to include Bupropion  and Chantix. Pt reports that she would like to start trial of Bupropion XL for smoking cessation. Will start Wellbutrin XL 150 mg po daily for smoking cessation.  Continue Remeron 45 mg po QHS for anxiety, depression, and insomnia.  Continue Diazepam 5 mg TID for anxiety.  Continue Buspar 7.5 mg po BID for anxiety.  Continue Gabapentin 400 mg po BID and 1600 mg at bedtime for anxiety and insomnia.  Continue Risperdal 1 mg po QHS for mood stabilization, anxiety, and insomnia.  Pt to follow-up in 6 months or sooner if clinically indicated.  Patient advised to contact office with any questions, adverse effects, or acute worsening in signs and symptoms.   Starsha was seen today for nicotine dependence and follow-up.  Diagnoses and all orders for this visit:  Generalized anxiety disorder -     mirtazapine (REMERON) 45 MG tablet; Take 1 tablet (45 mg total) by mouth at bedtime. -     diazepam (VALIUM) 5 MG tablet; Take 1 tablet (5 mg total) by mouth in the morning, at noon, and at bedtime.  Tobacco abuse -     buPROPion (WELLBUTRIN XL) 150 MG 24 hr tablet; Take 1 tablet (150 mg total) by  mouth daily.  Primary insomnia -     mirtazapine (REMERON) 45 MG tablet; Take 1 tablet (45 mg total) by mouth at bedtime. -     diazepam (VALIUM) 5 MG tablet; Take 1 tablet (5 mg total) by mouth in the morning, at noon, and at bedtime.  Episodic mood disorder -     mirtazapine (REMERON) 45 MG tablet; Take 1 tablet (45 mg total) by mouth at bedtime. -     risperiDONE (RISPERDAL) 1 MG tablet; TAKE 1 TABLET BY MOUTH EVERYDAY AT BEDTIME  GAD (generalized anxiety disorder) -     busPIRone (BUSPAR) 7.5 MG tablet; Take 1 tablet (7.5 mg total) by mouth 2 (two) times daily.    Please see After Visit Summary for patient specific instructions.  Future Appointments  Date Time Provider [place]  11/08/2022 11:00 AM Shelda Pal, [place] [facility]  04/25/2023 11:00 AM Thayer Headings, PMHNP [facility] None    No orders of the defined types were placed in this encounter.     -------------------------------

## 2022-11-03 ENCOUNTER — Telehealth: Payer: PPO | Admitting: Psychiatry

## 2022-11-05 ENCOUNTER — Other Ambulatory Visit: Payer: Self-pay | Admitting: Family Medicine

## 2022-11-08 ENCOUNTER — Ambulatory Visit (INDEPENDENT_AMBULATORY_CARE_PROVIDER_SITE_OTHER): Payer: PPO | Admitting: Family Medicine

## 2022-11-08 ENCOUNTER — Encounter: Payer: Self-pay | Admitting: Family Medicine

## 2022-11-08 VITALS — BP 132/78 | HR 75 | Temp 98.7°F | Ht 59.0 in | Wt 135.4 lb

## 2022-11-08 DIAGNOSIS — E890 Postprocedural hypothyroidism: Secondary | ICD-10-CM

## 2022-11-08 DIAGNOSIS — I10 Essential (primary) hypertension: Secondary | ICD-10-CM

## 2022-11-08 DIAGNOSIS — M549 Dorsalgia, unspecified: Secondary | ICD-10-CM | POA: Diagnosis not present

## 2022-11-08 DIAGNOSIS — E782 Mixed hyperlipidemia: Secondary | ICD-10-CM | POA: Diagnosis not present

## 2022-11-08 MED ORDER — LISINOPRIL 40 MG PO TABS: ORAL_TABLET | ORAL | 3 refills | Status: DC

## 2022-11-08 MED ORDER — METHYLPREDNISOLONE ACETATE 80 MG/ML IJ SUSP
80.0000 mg | Freq: Once | INTRAMUSCULAR | Status: AC
Start: 2022-11-08 — End: 2022-11-08
  Administered 2022-11-08: 80 mg via INTRAMUSCULAR

## 2022-11-08 MED ORDER — LINACLOTIDE 72 MCG PO CAPS: 72.0000 ug | ORAL_CAPSULE | Freq: Every day | ORAL | 2 refills | Status: DC

## 2022-11-08 NOTE — Patient Instructions (Signed)
Give us 2-3 business days to get the results of your labs back.   Keep the diet clean and stay active.  Aim to do some physical exertion for 150 minutes per week. This is typically divided into 5 days per week, 30 minutes per day. The activity should be enough to get your heart rate up. Anything is better than nothing if you have time constraints.  Let us know if you need anything.  

## 2022-11-08 NOTE — Progress Notes (Signed)
Chief Complaint  Patient presents with   Follow-up    6 month     Subjective Brenda Dunn is a 72 y.o. female who presents for hypertension follow up. She does not monitor home blood pressures. She is compliant with medications. Patient has these side effects of medication: none She is sometimes adhering to a healthy diet overall. Current exercise: none No CP or SOB.   Hypothyroidism Patient presents for follow-up of hypothyroidism.  Reports compliance with medication- levothyroxine 100 mcg/d. Current symptoms include: denies fatigue, weight changes, heat/cold intolerance, bowel/skin changes or CVS symptoms She believes her dose should be unchanged  Hyperlipidemia Patient presents for dyslipidemia follow up. Currently being treated with Lipitor 40 mg/d, fenofibrate 145 mg/d and compliance with treatment thus far has been good. She denies myalgias. Diet/exercise as above.  The patient is not known to have coexisting coronary artery disease.  Her back is flaring and she would like a steroid injection as this usually helps with things.   Past Medical History:  Diagnosis Date   Anxiety    Benzodiazepine dependence    Colonic polyp    mulitple polyps. repeat in 05/2019   Constipation    IBS   COPD (chronic obstructive pulmonary disease)    Depression    GERD (gastroesophageal reflux disease)    Hyperlipidemia    Hypertension    Insomnia    Migraine headache    OAB (overactive bladder)    Osteoarthritis    Osteoporosis 12/19/2017   Post-surgical hypothyroidism    Pulmonary embolism    Eliquis to stop 05/03/19   Scoliosis     Exam BP 132/78 (BP Location: Left Arm, Patient Position: Sitting, Cuff Size: Normal)   Pulse 75   Temp 98.7 F (37.1 C) (Oral)   Ht 4\' 11"  (1.499 m)   Wt 135 lb 6 oz (61.4 kg)   SpO2 97%   BMI 27.34 kg/m  General:  well developed, well nourished, in no apparent distress Heart: RRR, no bruits, 2+ pitting edema of b/l feet Lungs: clear to  auscultation, no accessory muscle use Psych: well oriented with normal range of affect and appropriate judgment/insight  Essential hypertension  Postoperative hypothyroidism  Mixed hyperlipidemia  Back pain, unspecified back location, unspecified back pain laterality, unspecified chronicity - Plan: methylPREDNISolone acetate (DEPO-MEDROL) injection 80 mg  Chronic, stable. Cont lisinopril 40 mg/d. Counseled on diet and exercise. Chronic, stable.  Continue levothyroxine 100 mcg daily. Chronic, stable.  Continue Lipitor 40 mg daily, fenofibrate 145 mg daily. Depo-Medrol injection today. F/u in 2 weeks for a skin procedure. The patient voiced understanding and agreement to the plan.  Jilda Roche [place], DO 11/08/22  1:33 PM

## 2022-11-14 ENCOUNTER — Encounter: Payer: Self-pay | Admitting: *Deleted

## 2022-11-21 ENCOUNTER — Telehealth: Payer: Self-pay | Admitting: Family Medicine

## 2022-11-21 MED ORDER — TRULICITY 3 MG/0.5ML ~~LOC~~ SOAJ: 3.0000 mg | SUBCUTANEOUS | 5 refills | Status: DC

## 2022-11-21 NOTE — Addendum Note (Signed)
Addended by: Scharlene Gloss B on: 11/21/2022 03:38 PM   Modules accepted: Orders

## 2022-11-21 NOTE — Telephone Encounter (Signed)
Sent in and patient made aware. 

## 2022-11-21 NOTE — Telephone Encounter (Signed)
Pt requesting Trulicity to be called into [facility] on [address] in [place] since [facility] is out.

## 2022-11-23 ENCOUNTER — Ambulatory Visit: Payer: PPO | Admitting: Family Medicine

## 2022-11-24 ENCOUNTER — Telehealth: Payer: Self-pay

## 2022-11-24 NOTE — Telephone Encounter (Signed)
PA initiated via Covermymeds; KEY: [id number].  Unsure how we have been previously getting approved, Pt does not have type 2 diabetes. PA ran through for obesity. Awaiting determination.

## 2022-11-29 NOTE — Telephone Encounter (Signed)
Pt called stating that she spoke with her insurance and they stated that an appeal would have to be filed for her to get her trulicity.

## 2022-11-29 NOTE — Telephone Encounter (Signed)
None for now, she will have to contact her ins co to see what wt loss medications they will cover. Ty.

## 2022-11-29 NOTE — Telephone Encounter (Signed)
Called the patient informed of PCP instructions and she will take care of doing.

## 2022-11-29 NOTE — Telephone Encounter (Signed)
Yes, we were doing our best to continue and knew this day would come. Ty.

## 2022-11-29 NOTE — Telephone Encounter (Signed)
Offer alternative

## 2022-11-29 NOTE — Telephone Encounter (Signed)
She does not have type 2 diabetes

## 2022-12-25 ENCOUNTER — Other Ambulatory Visit: Payer: Self-pay | Admitting: Family Medicine

## 2022-12-30 ENCOUNTER — Ambulatory Visit: Payer: PPO | Admitting: Podiatry

## 2022-12-30 DIAGNOSIS — L600 Ingrowing nail: Secondary | ICD-10-CM | POA: Diagnosis not present

## 2022-12-30 DIAGNOSIS — B351 Tinea unguium: Secondary | ICD-10-CM

## 2022-12-30 DIAGNOSIS — M79675 Pain in left toe(s): Secondary | ICD-10-CM | POA: Diagnosis not present

## 2022-12-30 DIAGNOSIS — M79674 Pain in right toe(s): Secondary | ICD-10-CM

## 2022-12-30 DIAGNOSIS — L84 Corns and callosities: Secondary | ICD-10-CM

## 2022-12-30 DIAGNOSIS — M2041 Other hammer toe(s) (acquired), right foot: Secondary | ICD-10-CM

## 2022-12-30 DIAGNOSIS — M2042 Other hammer toe(s) (acquired), left foot: Secondary | ICD-10-CM

## 2022-12-30 NOTE — Progress Notes (Signed)
Subjective:   Patient ID: Brenda Dunn, female   DOB: 72 y.o.   MRN: [id number]   HPI Chief Complaint  Patient presents with   Ingrown Toenail    Pt complains of ingrown pain of both hallux. Soreness. No drainage or bleeding.    Callouses    Right foot callus debridement   Nail Problem    RFC bilateral nail trim   72 year old female presents the above concerns.  She states that the nails are thickened elongated.  They are causing discomfort and getting ingrown.  She also has calluses.  Callus wart was present previously and she also applied aspirin as a paste to apply. No open lesions.  No drainage or pus.  No other concerns.   Review of Systems  All other systems reviewed and are negative.  Past Medical History:  Diagnosis Date   Anxiety    Benzodiazepine dependence (HCC)    Colonic polyp    mulitple polyps. repeat in 05/2019   Constipation    IBS   COPD (chronic obstructive pulmonary disease) (HCC)    Depression    GERD (gastroesophageal reflux disease)    Hyperlipidemia    Hypertension    Insomnia    Migraine headache    OAB (overactive bladder)    Osteoarthritis    Osteoporosis 12/19/2017   Post-surgical hypothyroidism    Pulmonary embolism (HCC)    Eliquis to stop 05/03/19   Scoliosis     Past Surgical History:  Procedure Laterality Date   ABDOMINAL AORTOGRAM W/LOWER EXTREMITY Bilateral 03/24/2020   Procedure: ABDOMINAL AORTOGRAM W/LOWER EXTREMITY;  Surgeon: Nada Libman, MD;  Location: [facility];  Service: Cardiovascular;  Laterality: Bilateral;   BREAST CYST ASPIRATION Left    HEMORRHOID SURGERY     PERIPHERAL VASCULAR INTERVENTION Bilateral 03/24/2020   Procedure: PERIPHERAL VASCULAR INTERVENTION;  Surgeon: Nada Libman, MD;  Location: [facility];  Service: Cardiovascular;  Laterality: Bilateral;  ILIAC STENTS   THYROIDECTOMY     at [facility]   TUBAL LIGATION  1979     Current Outpatient Medications:    acetaminophen (TYLENOL) 500 MG  tablet, Take 1,000 mg by mouth every 6 (six) hours as needed for moderate pain., Disp: , Rfl:    albuterol (VENTOLIN HFA) 108 (90 Base) MCG/ACT inhaler, Inhale 2 puffs into the lungs every 4 (four) hours as needed for wheezing or shortness of breath., Disp: 2 each, Rfl: 3   alendronate (FOSAMAX) 70 MG tablet, TAKE 1 TABLET BY MOUTH EVERY WEEK WITH FULL GLASS OF WATER ON EMPTY STOMACH (Patient not taking: Reported on 10/31/2022), Disp: 12 tablet, Rfl: 3   aspirin EC 81 MG tablet, Take 1 tablet (81 mg total) by mouth daily. (Patient not taking: Reported on 10/31/2022), Disp: 150 tablet, Rfl: 2   atorvastatin (LIPITOR) 40 MG tablet, TAKE 1 TABLET BY MOUTH EVERYDAY AT BEDTIME, Disp: 90 tablet, Rfl: 1   buPROPion (WELLBUTRIN XL) 150 MG 24 hr tablet, Take 1 tablet (150 mg total) by mouth daily., Disp: 90 tablet, Rfl: 1   busPIRone (BUSPAR) 7.5 MG tablet, Take 1 tablet (7.5 mg total) by mouth 2 (two) times daily., Disp: 180 tablet, Rfl: 2   Calcium Carbonate-Vitamin D (CALTRATE 600+D) 600-400 MG-UNIT tablet, Take 1 tablet by mouth 2 (two) times daily. (Patient not taking: Reported on 10/31/2022), Disp: 60 tablet, Rfl: 11   clopidogrel (PLAVIX) 75 MG tablet, TAKE 1 TABLET BY MOUTH EVERY DAY (Patient not taking: Reported on 10/31/2022), Disp: 30  tablet, Rfl: 0   diazepam (VALIUM) 5 MG tablet, Take 1 tablet (5 mg total) by mouth in the morning, at noon, and at bedtime., Disp: 90 tablet, Rfl: 5   diclofenac Sodium (VOLTAREN) 1 % GEL, Apply 4 g topically 4 (four) times daily. To affected joint. (Patient not taking: Reported on 05/04/2022), Disp: 100 g, Rfl: 11   docusate sodium (COLACE) 100 MG capsule, Take 100-200 mg by mouth at bedtime., Disp: , Rfl:    Dulaglutide (TRULICITY) 3 MG/0.5ML SOPN, Inject 3 mg as directed once a week., Disp: 2 mL, Rfl: 5   esomeprazole (NEXIUM) 40 MG capsule, TAKE 1 CAPSULE BY MOUTH EVERY DAY, Disp: 90 capsule, Rfl: 1   fenofibrate (TRICOR) 145 MG tablet, Take 1 tablet (145 mg total) by mouth  daily., Disp: 90 tablet, Rfl: 3   fluticasone furoate-vilanterol (BREO ELLIPTA) 200-25 MCG/ACT AEPB, Inhale 1 puff into the lungs daily., Disp: 1 each, Rfl: 11   gabapentin (NEURONTIN) 400 MG capsule, TAKE 1 CAPSULE BY MOUTH TWICE A DAY AND 4 CAPSULES AT BEDTIME, Disp: 540 capsule, Rfl: 1   levocetirizine (XYZAL) 5 MG tablet, TAKE 1 TABLET BY MOUTH EVERY DAY IN THE EVENING (Patient not taking: Reported on 05/04/2022), Disp: 90 tablet, Rfl: 1   levothyroxine (SYNTHROID) 100 MCG tablet, TAKE 1 TABLET BY MOUTH EVERY DAY, Disp: 90 tablet, Rfl: 1   linaclotide (LINZESS) 72 MCG capsule, Take 1 capsule (72 mcg total) by mouth daily before breakfast., Disp: 30 capsule, Rfl: 2   lisinopril (ZESTRIL) 40 MG tablet, TAKE 1 TABLET BY MOUTH EVERY DAY ( NEED MED D), Disp: 90 tablet, Rfl: 3   mirtazapine (REMERON) 45 MG tablet, Take 1 tablet (45 mg total) by mouth at bedtime., Disp: 90 tablet, Rfl: 1   MYRBETRIQ 50 MG TB24 tablet, TAKE 1 TABLET BY MOUTH EVERY DAY, Disp: 90 tablet, Rfl: 1   nitroGLYCERIN (NITRODUR - DOSED IN MG/24 HR) 0.4 mg/hr patch, Place 1 patch (0.4 mg total) onto the skin daily. (Patient not taking: Reported on 05/04/2022), Disp: 30 patch, Rfl: 12   pantoprazole (PROTONIX) 40 MG tablet, TAKE 1 TABLET BY MOUTH EVERY DAY, Disp: 90 tablet, Rfl: 1   prazosin (MINIPRESS) 1 MG capsule, TAKE 2 CAPSULES BY MOUTH EVERY DAY AT BEDTIME WITH THE 5 MG CAPSULE FOR A TOTAL OF 7 MG NIGHTLY., Disp: 180 capsule, Rfl: 1   prazosin (MINIPRESS) 5 MG capsule, TAKE 1 CAPSULE BY MOUTH EVERYDAY AT BEDTIME WITH 1 MG PRAZOSIN, A TOTAL OF 7 MG NIGHTLY., Disp: 90 capsule, Rfl: 1   risperiDONE (RISPERDAL) 1 MG tablet, TAKE 1 TABLET BY MOUTH EVERYDAY AT BEDTIME, Disp: 90 tablet, Rfl: 1  No Known Allergies         Objective:  Physical Exam  General: AAO x3, NAD  Dermatological: Hyperkeratotic lesion dorsal aspect of the toe if the hammertoe.  No underlying ulceration signs infection or any evidence of verruca today.   Nails are hypertrophic, dystrophic with yellow, brown discoloration.  There is incurvation of the hallux toenails there is no edema, erythema or signs of infection.  Vascular: Dorsalis Pedis artery and Posterior Tibial artery pedal pulses are 2/4 bilateral with immedate capillary fill time.  There is no pain with calf compression, swelling, warmth, erythema.   Neruologic: Grossly intact via light touch bilateral.   Musculoskeletal: Hammertoes present.  Gait: Unassisted, Nonantalgic.       Assessment:   72 year old female with symptomatic onychomycosis, ingrown toenails;      Plan:  -Treatment  options discussed including all alternatives, risks, and complications -Etiology of symptoms were discussed -We discussed partial nail avulsion with ingrown toenail but also decided proceed with debridement.  Sharp debrided nails x 10 without any complications or bleeding.  If symptoms persist consider partial nail avulsion. -Sharply debrided the calluses without any complications or bleeding.  Moisturizer, offloading.  Vivi Barrack DPM

## 2022-12-30 NOTE — Patient Instructions (Signed)

## 2023-01-04 ENCOUNTER — Other Ambulatory Visit: Payer: Self-pay | Admitting: Psychiatry

## 2023-01-04 DIAGNOSIS — F5101 Primary insomnia: Secondary | ICD-10-CM

## 2023-01-04 DIAGNOSIS — F411 Generalized anxiety disorder: Secondary | ICD-10-CM

## 2023-01-06 ENCOUNTER — Other Ambulatory Visit: Payer: Self-pay | Admitting: Family Medicine

## 2023-03-03 ENCOUNTER — Ambulatory Visit: Payer: PPO | Admitting: Podiatry

## 2023-03-05 ENCOUNTER — Other Ambulatory Visit: Payer: Self-pay | Admitting: Family Medicine

## 2023-03-05 DIAGNOSIS — Z634 Disappearance and death of family member: Secondary | ICD-10-CM

## 2023-03-06 ENCOUNTER — Telehealth: Payer: Self-pay | Admitting: Family Medicine

## 2023-03-06 MED ORDER — LINACLOTIDE 145 MCG PO CAPS: 145.0000 ug | ORAL_CAPSULE | Freq: Every day | ORAL | 0 refills | Status: DC

## 2023-03-06 MED ORDER — LINACLOTIDE 290 MCG PO CAPS: 290.0000 ug | ORAL_CAPSULE | Freq: Every day | ORAL | 5 refills | Status: DC

## 2023-03-06 NOTE — Telephone Encounter (Signed)
The patient would like a refill of the linzess 290 ( wants to go back on this)

## 2023-03-06 NOTE — Telephone Encounter (Signed)
Sent in the 145 mcg dose for a month and the 290s are at pharmacy after that. Ty.

## 2023-03-06 NOTE — Telephone Encounter (Signed)
Patient would like to go back on the Linzess 290 mcg

## 2023-03-06 NOTE — Telephone Encounter (Signed)
Pt called and requested to speak with Robin to discuss her medication, linaclotide (LINZESS) 72 MCG capsule  Please advise pt.

## 2023-03-06 NOTE — Telephone Encounter (Signed)
Called left message script sent in.

## 2023-03-06 NOTE — Addendum Note (Signed)
Addended by: Radene Gunning on: 03/06/2023 11:10 AM   Modules accepted: Orders

## 2023-03-22 ENCOUNTER — Other Ambulatory Visit: Payer: Self-pay | Admitting: Family Medicine

## 2023-03-27 ENCOUNTER — Other Ambulatory Visit: Payer: Self-pay | Admitting: Family Medicine

## 2023-03-28 ENCOUNTER — Telehealth (INDEPENDENT_AMBULATORY_CARE_PROVIDER_SITE_OTHER): Payer: PPO | Admitting: Family Medicine

## 2023-03-28 ENCOUNTER — Telehealth: Payer: Self-pay | Admitting: Family Medicine

## 2023-03-28 ENCOUNTER — Encounter: Payer: Self-pay | Admitting: Family Medicine

## 2023-03-28 DIAGNOSIS — Z634 Disappearance and death of family member: Secondary | ICD-10-CM | POA: Diagnosis not present

## 2023-03-28 DIAGNOSIS — G4700 Insomnia, unspecified: Secondary | ICD-10-CM | POA: Diagnosis not present

## 2023-03-28 MED ORDER — PRAZOSIN HCL 5 MG PO CAPS
ORAL_CAPSULE | ORAL | 2 refills | Status: DC
Start: 2023-03-28 — End: 2023-06-22

## 2023-03-28 NOTE — Telephone Encounter (Signed)
Called and scheduled today at 12:30 VV

## 2023-03-28 NOTE — Progress Notes (Signed)
Chief Complaint  Patient presents with   Follow-up    Patients brother passed away    Subjective: Patient is a 72 y.o. female here for f/u. We are interacting via web portal for an electronic face-to-face visit. I verified patient's ID using 2 identifiers. Patient agreed to proceed with visit via this method. Patient is at home, I am at office. Patient and I are present for visit.   2 AM today, she was notified her brother committed suicide. She is having headaches, shaking, racing thoughts, difficulty concentration, high stress/depression.  The patient was unable to fall back asleep since hearing the news.  She does see psychiatry.  She is currently taking Valium 5 mg 3 times daily as needed, mirtazapine 45 mg nightly, gabapentin 800 mg daily and 600 mg at night, Risperdal 1 mg daily, and prazosin 7 mg nightly.  She gets all with the prazosin from psychiatry.  Past Medical History:  Diagnosis Date   Anxiety    Benzodiazepine dependence (HCC)    Colonic polyp    mulitple polyps. repeat in 05/2019   Constipation    IBS   COPD (chronic obstructive pulmonary disease) (HCC)    Depression    GERD (gastroesophageal reflux disease)    Hyperlipidemia    Hypertension    Insomnia    Migraine headache    OAB (overactive bladder)    Osteoarthritis    Osteoporosis 12/19/2017   Post-surgical hypothyroidism    Pulmonary embolism (HCC)    Eliquis to stop 05/03/19   Scoliosis     Objective: No conversational dyspnea Age appropriate judgment and insight Nml affect and mood  Assessment and Plan: Bereavement - Plan: prazosin (MINIPRESS) 5 MG capsule  Insomnia, unspecified type  Counseling suggested. Stay active. Appreciate psych. Exacerbation of chronic issue 2/2 #1. Increase prazosin from 7 mg qhs to 10 mg qhs. F/u in 2 weeks.  Would continue other medications from psychiatry including Risperdal 1 mg daily, mirtazapine 45 mg daily, diazepam 5 mg 3 times daily as needed. The patient voiced  understanding and agreement to the plan.  Jilda Roche Goessel, DO 03/28/23  2:07 PM

## 2023-03-28 NOTE — Telephone Encounter (Signed)
Pt called to advise that her brother killed himself this morning and she wanted to know if DR. [name] can call in a sedative for her. Pt said it was totally unexpected and she's been up since 2 am when they called. Please send to [facility] on [address] in [place]. Please call pt to advise.

## 2023-04-18 ENCOUNTER — Telehealth: Payer: Self-pay | Admitting: Psychiatry

## 2023-04-18 ENCOUNTER — Other Ambulatory Visit: Payer: Self-pay | Admitting: Psychiatry

## 2023-04-18 DIAGNOSIS — F411 Generalized anxiety disorder: Secondary | ICD-10-CM

## 2023-04-18 DIAGNOSIS — F5101 Primary insomnia: Secondary | ICD-10-CM

## 2023-04-18 NOTE — Telephone Encounter (Signed)
Addressed thru pharmacy RF request.

## 2023-04-18 NOTE — Telephone Encounter (Signed)
Next visit is 9/24. Requesting refill on Diazepam 5 mg called to:  [id number] - [place], [place] - [address]   Phone: [phone]  Fax: [phone]

## 2023-04-25 ENCOUNTER — Ambulatory Visit (INDEPENDENT_AMBULATORY_CARE_PROVIDER_SITE_OTHER): Payer: PPO | Admitting: Psychiatry

## 2023-04-25 ENCOUNTER — Encounter: Payer: Self-pay | Admitting: Psychiatry

## 2023-04-25 DIAGNOSIS — F5101 Primary insomnia: Secondary | ICD-10-CM | POA: Diagnosis not present

## 2023-04-25 DIAGNOSIS — F411 Generalized anxiety disorder: Secondary | ICD-10-CM | POA: Diagnosis not present

## 2023-04-25 DIAGNOSIS — F39 Unspecified mood [affective] disorder: Secondary | ICD-10-CM

## 2023-04-25 MED ORDER — RISPERIDONE 1 MG PO TABS
ORAL_TABLET | ORAL | 1 refills | Status: DC
Start: 2023-04-25 — End: 2023-11-20

## 2023-04-25 MED ORDER — GABAPENTIN 400 MG PO CAPS
ORAL_CAPSULE | ORAL | 1 refills | Status: DC
Start: 2023-06-29 — End: 2023-06-26

## 2023-04-25 MED ORDER — MIRTAZAPINE 45 MG PO TABS: 45.0000 mg | ORAL_TABLET | Freq: Every day | ORAL | 1 refills | Status: DC

## 2023-04-25 MED ORDER — DIAZEPAM 5 MG PO TABS
5.0000 mg | ORAL_TABLET | Freq: Three times a day (TID) | ORAL | 5 refills | Status: DC
Start: 2023-05-16 — End: 2023-10-02

## 2023-04-25 NOTE — Progress Notes (Signed)
Brenda Dunn [id number] October 24, 1950 72 y.o.  Virtual Visit via Telephone Note  I connected with pt on 04/25/23 at 11:00 AM EDT by telephone and verified that I am speaking with the correct person using two identifiers.   I discussed the limitations, risks, security and privacy concerns of performing an evaluation and management service by telephone and the availability of in person appointments. I also discussed with the patient that there may be a patient responsible charge related to this service. The patient expressed understanding and agreed to proceed.   I discussed the assessment and treatment plan with the patient. The patient was provided an opportunity to ask questions and all were answered. The patient agreed with the plan and demonstrated an understanding of the instructions.   The patient was advised to call back or seek an in-person evaluation if the symptoms worsen or if the condition fails to improve as anticipated.  I provided 14 minutes of non-face-to-face time during this encounter.  The patient was located at home.  The provider was located at [facility].   Corie Chiquito, PMHNP   Subjective:   Patient ID:  Brenda Dunn is a 72 y.o. (DOB 07/04/1951) female.  Chief Complaint:  Chief Complaint  Patient presents with   Follow-up    Anxiety, depression, and insomnia    HPI Brenda Dunn presents for follow-up of depression, anxiety, and insomnia. She reports that anxiety is well controlled with Diazepam. Denies current anxiety. Mood has been "good." Denies depression. She reports sleeping well with medication. She reports that her appetite is "better" after a loss of appetite a few months ago. She reports that she lost weight when she lost her appetite. Energy and motivation have been good. She reports concentration is consistent with baseline. She reports long-standing difficulty with concentration. Denies SI.   She reports that her only brother committed  suicide 03/28/23. Continues to see sister and talk with her daily. PCP increased Prazosin to 10 mg at bedtime on 03/28/23. She reports that the increase was helpful and she has since decreased dose to 7 mg at bedtime. Denies nightmares.  Diazepam last filled 04/18/23. Gabapentin 04/06/23 for 90 day.   Past Psychiatric Medication Trials: Remeron- Had worsening depression and insomnia with 30 mg dose. Olanzapine Seroquel  Risperdal-helpful for mood, anxiety, and insomnia Diazepam Gabapentin Trazodone Trileptal- hyponatremia Ambien  Review of Systems:  Review of Systems  Respiratory:  Negative for shortness of breath.   Musculoskeletal:  Negative for gait problem.  Neurological:  Negative for tremors.  Psychiatric/Behavioral:         Please refer to HPI    Medications: I have reviewed the patient's current medications.  Current Outpatient Medications  Medication Sig Dispense Refill   albuterol (VENTOLIN HFA) 108 (90 Base) MCG/ACT inhaler INHALE 2 PUFFS INTO THE LUNGS EVERY 4 HOURS AS NEEDED FOR WHEEZING OR SHORTNESS OF BREATH. 13.4 each 3   atorvastatin (LIPITOR) 40 MG tablet TAKE 1 TABLET BY MOUTH EVERYDAY AT BEDTIME 90 tablet 1   budesonide-formoterol (SYMBICORT) 160-4.5 MCG/ACT inhaler Inhale 2 puffs into the lungs 2 (two) times daily.     docusate sodium (COLACE) 100 MG capsule Take 100-200 mg by mouth at bedtime.     fenofibrate (TRICOR) 145 MG tablet Take 1 tablet (145 mg total) by mouth daily. 90 tablet 3   levothyroxine (SYNTHROID) 100 MCG tablet TAKE 1 TABLET BY MOUTH EVERY DAY 90 tablet 1   linaclotide (LINZESS) 290 MCG CAPS capsule Take 1 capsule (  290 mcg total) by mouth daily before breakfast. 30 capsule 5   lisinopril (ZESTRIL) 40 MG tablet TAKE 1 TABLET BY MOUTH EVERY DAY ( NEED MED D) 90 tablet 3   MYRBETRIQ 50 MG TB24 tablet TAKE 1 TABLET BY MOUTH EVERY DAY 90 tablet 1   prazosin (MINIPRESS) 5 MG capsule TAKE 2 CAPSULES BY MOUTH DAILY AT BEDTIME. 60 capsule 2    acetaminophen (TYLENOL) 500 MG tablet Take 1,000 mg by mouth every 6 (six) hours as needed for moderate pain.     alendronate (FOSAMAX) 70 MG tablet TAKE 1 TABLET BY MOUTH EVERY WEEK WITH FULL GLASS OF WATER ON EMPTY STOMACH (Patient not taking: Reported on 10/31/2022) 12 tablet 3   aspirin EC 81 MG tablet Take 1 tablet (81 mg total) by mouth daily. (Patient not taking: Reported on 10/31/2022) 150 tablet 2   Calcium Carbonate-Vitamin D (CALTRATE 600+D) 600-400 MG-UNIT tablet Take 1 tablet by mouth 2 (two) times daily. (Patient not taking: Reported on 10/31/2022) 60 tablet 11   clopidogrel (PLAVIX) 75 MG tablet TAKE 1 TABLET BY MOUTH EVERY DAY (Patient not taking: Reported on 10/31/2022) 30 tablet 0   [START ON 05/16/2023] diazepam (VALIUM) 5 MG tablet Take 1 tablet (5 mg total) by mouth in the morning, at noon, and at bedtime. 90 tablet 5   diclofenac Sodium (VOLTAREN) 1 % GEL Apply 4 g topically 4 (four) times daily. To affected joint. (Patient not taking: Reported on 05/04/2022) 100 g 11   Dulaglutide (TRULICITY) 3 MG/0.5ML SOPN Inject 3 mg as directed once a week. (Patient not taking: Reported on 04/25/2023) 2 mL 5   esomeprazole (NEXIUM) 40 MG capsule TAKE 1 CAPSULE BY MOUTH EVERY DAY (Patient not taking: Reported on 04/25/2023) 90 capsule 1   fluticasone furoate-vilanterol (BREO ELLIPTA) 200-25 MCG/ACT AEPB Inhale 1 puff into the lungs daily. (Patient not taking: Reported on 04/25/2023) 1 each 11   [START ON 06/29/2023] gabapentin (NEURONTIN) 400 MG capsule TAKE 1 CAPSULE BY MOUTH TWICE A DAY AND 4 CAPSULES AT BEDTIME 540 capsule 1   levocetirizine (XYZAL) 5 MG tablet TAKE 1 TABLET BY MOUTH EVERY DAY IN THE EVENING (Patient not taking: Reported on 05/04/2022) 90 tablet 1   linaclotide (LINZESS) 145 MCG CAPS capsule Take 1 capsule (145 mcg total) by mouth daily before breakfast. 30 capsule 0   mirtazapine (REMERON) 45 MG tablet Take 1 tablet (45 mg total) by mouth at bedtime. 90 tablet 1   nitroGLYCERIN  (NITRODUR - DOSED IN MG/24 HR) 0.4 mg/hr patch Place 1 patch (0.4 mg total) onto the skin daily. (Patient not taking: Reported on 05/04/2022) 30 patch 12   pantoprazole (PROTONIX) 40 MG tablet TAKE 1 TABLET BY MOUTH EVERY DAY (Patient not taking: Reported on 04/25/2023) 90 tablet 1   risperiDONE (RISPERDAL) 1 MG tablet TAKE 1 TABLET BY MOUTH EVERYDAY AT BEDTIME 90 tablet 1   No current facility-administered medications for this visit.    Medication Side Effects: None  Allergies: No Known Allergies  Past Medical History:  Diagnosis Date   Anxiety    Benzodiazepine dependence (HCC)    Colonic polyp    mulitple polyps. repeat in 05/2019   Constipation    IBS   COPD (chronic obstructive pulmonary disease) (HCC)    Depression    GERD (gastroesophageal reflux disease)    Hyperlipidemia    Hypertension    Insomnia    Migraine headache    OAB (overactive bladder)    Osteoarthritis  Osteoporosis 12/19/2017   Post-surgical hypothyroidism    Pulmonary embolism (HCC)    Eliquis to stop 05/03/19   Scoliosis     Family History  Problem Relation Age of Onset   Breast cancer Mother    Arthritis Mother    Hearing loss Mother    Hyperlipidemia Mother    Hypertension Mother    Anxiety disorder Mother    Lung cancer Maternal Uncle    Anxiety disorder Maternal Uncle    Lung cancer Maternal Grandmother    Heart attack Maternal Grandmother    Anxiety disorder Maternal Grandmother    Asthma Father    COPD Father    Hyperlipidemia Father    Hypertension Father    Alcohol abuse Father    Anxiety disorder Sister    Hypertension Sister    Thyroid cancer Sister    Anxiety disorder Brother    Hypertension Brother    Drug abuse Son    Anxiety disorder Sister    Hypertension Sister    Anxiety disorder Sister    Hypertension Sister    Hyperlipidemia Son     Social History   Socioeconomic History   Marital status: Married    Spouse name: Not on file   Number of children: Not on  file   Years of education: Not on file   Highest education level: Not on file  Occupational History   Occupation: disabled due "my nerves"  Tobacco Use   Smoking status: Every Day    Current packs/day: 1.00    Average packs/day: 1 pack/day for 44.0 years (44.0 ttl pk-yrs)    Types: Cigarettes   Smokeless tobacco: Never  Vaping Use   Vaping status: Former  Substance and Sexual Activity   Alcohol use: No   Drug use: No   Sexual activity: Not on file  Other Topics Concern   Not on file  Social History Narrative   Not on file   Social Determinants of Health   Financial Resource Strain: Low Risk  (02/13/2020)   Overall Financial Resource Strain (CARDIA)    Difficulty of Paying Living Expenses: Not hard at all  Food Insecurity: No Food Insecurity (02/13/2020)   Hunger Vital Sign    Worried About Running Out of Food in the Last Year: Never true    Ran Out of Food in the Last Year: Never true  Transportation Needs: No Transportation Needs (02/13/2020)   PRAPARE - Administrator, Civil Service (Medical): No    Lack of Transportation (Non-Medical): No  Physical Activity: Inactive (04/08/2022)   Exercise Vital Sign    Days of Exercise per Week: 0 days    Minutes of Exercise per Session: 0 min  Stress: No Stress Concern Present (04/08/2022)   [employer] of Occupational Health - Occupational Stress Questionnaire    Feeling of Stress : Not at all  Social Connections: Moderately Isolated (04/08/2022)   Social Connection and Isolation Panel [NHANES]    Frequency of Communication with Friends and Family: More than three times a week    Frequency of Social Gatherings with Friends and Family: Twice a week    Attends Religious Services: More than 4 times per year    Active Member of [employer] or Organizations: No    Attends Banker Meetings: Never    Marital Status: Widowed  Intimate Partner Violence: Not on file    Past Medical History, Surgical history, Social  history, and Family history were reviewed and updated as appropriate.  Please see review of systems for further details on the patient's review from today.   Objective:   Physical Exam:  Wt 122 lb (55.3 kg)   BMI 24.64 kg/m   Physical Exam Neurological:     Mental Status: She is alert and oriented to person, place, and time.     Cranial Nerves: No dysarthria.  Psychiatric:        Attention and Perception: Attention and perception normal.        Mood and Affect: Mood normal.        Speech: Speech normal.        Behavior: Behavior is cooperative.        Thought Content: Thought content normal. Thought content is not paranoid or delusional. Thought content does not include homicidal or suicidal ideation. Thought content does not include homicidal or suicidal plan.        Cognition and Memory: Cognition and memory normal.        Judgment: Judgment normal.     Comments: Insight intact     Lab Review:     Component Value Date/Time   NA 139 03/09/2022 1127   NA 140 10/02/2017 1700   K 3.8 03/09/2022 1127   CL 103 03/09/2022 1127   CO2 23 03/09/2022 1127   GLUCOSE 77 03/09/2022 1127   BUN 14 03/09/2022 1127   BUN 11 10/02/2017 1700   CREATININE 0.81 03/09/2022 1127   CREATININE 0.98 05/18/2017 1426   CALCIUM 10.2 03/09/2022 1127   PROT 7.3 03/09/2022 1127   ALBUMIN 4.9 03/09/2022 1127   AST 18 03/09/2022 1127   ALT 16 03/09/2022 1127   ALKPHOS 57 03/09/2022 1127   BILITOT 0.5 03/09/2022 1127   GFRNONAA 56 (L) 04/13/2019 0915   GFRNONAA 88 05/10/2017 1023   GFRAA >60 04/13/2019 0915   GFRAA 102 05/10/2017 1023       Component Value Date/Time   WBC 6.0 03/09/2022 1127   RBC 4.21 03/09/2022 1127   HGB 13.8 03/09/2022 1127   HGB 11.6 08/28/2017 1142   HCT 41.4 03/09/2022 1127   HCT 33.2 (L) 08/28/2017 1142   PLT 166.0 03/09/2022 1127   PLT 222 08/28/2017 1142   MCV 98.2 03/09/2022 1127   MCV 88 08/28/2017 1142   MCH 32.4 04/13/2019 0915   MCHC 33.4 03/09/2022  1127   RDW 12.9 03/09/2022 1127   RDW 13.4 08/28/2017 1142   LYMPHSABS 1.7 04/13/2019 0915   LYMPHSABS 1.7 08/28/2017 1142   MONOABS 0.4 04/13/2019 0915   EOSABS 0.5 04/13/2019 0915   EOSABS 0.1 08/28/2017 1142   BASOSABS 0.0 04/13/2019 0915   BASOSABS 0.0 08/28/2017 1142    No results found for: "POCLITH", "LITHIUM"   No results found for: "PHENYTOIN", "PHENOBARB", "VALPROATE", "CBMZ"   .res Assessment: Plan:    Will continue current plan of care since target signs and symptoms are well controlled without any tolerability issues. Continue Remeron 45 mg at bedtime for depression, anxiety, and insomnia.  Continue Gabapentin 400 mg twice daily and 1600 mg at bedtime for anxiety and insomnia.  Continue Diazepam 5 mg TID for anxiety.  Continue Risperdal 1 mg po at bedtime for mood stabilization, anxiety, and insomnia.  Pt to follow-up in 6 months or sooner if clinically indicated.  Patient advised to contact office with any questions, adverse effects, or acute worsening in signs and symptoms.   Brenda Dunn was seen today for follow-up.  Diagnoses and all orders for this visit:  Generalized anxiety disorder -  diazepam (VALIUM) 5 MG tablet; Take 1 tablet (5 mg total) by mouth in the morning, at noon, and at bedtime. -     mirtazapine (REMERON) 45 MG tablet; Take 1 tablet (45 mg total) by mouth at bedtime. -     gabapentin (NEURONTIN) 400 MG capsule; TAKE 1 CAPSULE BY MOUTH TWICE A DAY AND 4 CAPSULES AT BEDTIME  Primary insomnia -     diazepam (VALIUM) 5 MG tablet; Take 1 tablet (5 mg total) by mouth in the morning, at noon, and at bedtime. -     mirtazapine (REMERON) 45 MG tablet; Take 1 tablet (45 mg total) by mouth at bedtime. -     gabapentin (NEURONTIN) 400 MG capsule; TAKE 1 CAPSULE BY MOUTH TWICE A DAY AND 4 CAPSULES AT BEDTIME  Episodic mood disorder (HCC) -     mirtazapine (REMERON) 45 MG tablet; Take 1 tablet (45 mg total) by mouth at bedtime. -     risperiDONE  (RISPERDAL) 1 MG tablet; TAKE 1 TABLET BY MOUTH EVERYDAY AT BEDTIME    Please see After Visit Summary for patient specific instructions.  Future Appointments  Date Time Provider Department Center  10/02/2023  8:30 AM Corie Chiquito, PMHNP [facility] None    No orders of the defined types were placed in this encounter.     -------------------------------

## 2023-05-01 ENCOUNTER — Telehealth: Payer: PPO | Admitting: Family Medicine

## 2023-05-11 ENCOUNTER — Other Ambulatory Visit: Payer: Self-pay | Admitting: Family Medicine

## 2023-05-16 ENCOUNTER — Encounter: Payer: Self-pay | Admitting: Family Medicine

## 2023-05-16 ENCOUNTER — Telehealth (INDEPENDENT_AMBULATORY_CARE_PROVIDER_SITE_OTHER): Payer: PPO | Admitting: Family Medicine

## 2023-05-16 DIAGNOSIS — K5900 Constipation, unspecified: Secondary | ICD-10-CM | POA: Diagnosis not present

## 2023-05-16 MED ORDER — LUBIPROSTONE 24 MCG PO CAPS
24.0000 ug | ORAL_CAPSULE | Freq: Two times a day (BID) | ORAL | 2 refills | Status: DC
Start: 2023-05-16 — End: 2023-06-21

## 2023-05-16 NOTE — Progress Notes (Signed)
Chief Complaint  Patient presents with   medication for BM's discuss   Constipation    Please call into pharmacy as soon as possible. [facility] [place]     Subjective: Patient is a 72 y.o. female here for constipation. We are interacting via web portal for an electronic face-to-face visit. I verified patient's ID using 2 identifiers. Patient agreed to proceed with visit via this method. Patient is at home, I am at office. Patient and I are present for visit.   Pt has chronic constipation, on Linzess 290 mcg/d. Reports compliance, no AE's. She has been straining and having small stools over the past couple weeks. Going once per week. She is not staying hydrated. Has associated bloating. No bleeding, N/V. Has also been having to take a laxative with it to get anything out.   Past Medical History:  Diagnosis Date   Anxiety    Benzodiazepine dependence (HCC)    Colonic polyp    mulitple polyps. repeat in 05/2019   Constipation    IBS   COPD (chronic obstructive pulmonary disease) (HCC)    Depression    GERD (gastroesophageal reflux disease)    Hyperlipidemia    Hypertension    Insomnia    Migraine headache    OAB (overactive bladder)    Osteoarthritis    Osteoporosis 12/19/2017   Post-surgical hypothyroidism    Pulmonary embolism (HCC)    Eliquis to stop 05/03/19   Scoliosis     Objective: No conversational dyspnea Age appropriate judgment and insight Nml affect and mood  Assessment and Plan: Constipation, unspecified constipation type - Plan: lubiprostone (AMITIZA) 24 MCG capsule  Chronic, uncontrolled. Change Linzess to Amitiza bid. Increase water and fiber intake. F/u in 1 mo.  The patient voiced understanding and agreement to the plan.  Jilda Roche [place], DO 05/16/23  8:13 AM

## 2023-05-29 ENCOUNTER — Ambulatory Visit: Payer: PPO | Admitting: Podiatry

## 2023-05-29 ENCOUNTER — Encounter: Payer: Self-pay | Admitting: Podiatry

## 2023-05-29 ENCOUNTER — Ambulatory Visit: Payer: PPO

## 2023-05-29 ENCOUNTER — Ambulatory Visit (INDEPENDENT_AMBULATORY_CARE_PROVIDER_SITE_OTHER): Payer: PPO

## 2023-05-29 DIAGNOSIS — D492 Neoplasm of unspecified behavior of bone, soft tissue, and skin: Secondary | ICD-10-CM

## 2023-05-29 DIAGNOSIS — M79674 Pain in right toe(s): Secondary | ICD-10-CM

## 2023-05-29 DIAGNOSIS — M79675 Pain in left toe(s): Secondary | ICD-10-CM | POA: Diagnosis not present

## 2023-05-29 DIAGNOSIS — B351 Tinea unguium: Secondary | ICD-10-CM | POA: Diagnosis not present

## 2023-05-29 NOTE — Patient Instructions (Signed)

## 2023-05-31 ENCOUNTER — Telehealth: Payer: Self-pay

## 2023-05-31 NOTE — Telephone Encounter (Signed)
Received surgery paperwork from Dr. Ardelle Anton. Left a message for Aireal to call and schedule surgery.

## 2023-06-04 NOTE — Progress Notes (Signed)
Subjective:   Patient ID: Brenda Dunn, female   DOB: 72 y.o.   MRN: [id number]   HPI Chief Complaint  Patient presents with   Toe Injury    PATIENT STATES THAT SHE IS NOT IN NO PAIN SHE JUST WANTS TO GET HER EXTRA TOE REMOVED AND TOE NAIL CUT    72 year old female presents the above concerns.  She has a growth on her toe that she had to have removed as well as her nails trimmed.  The growth has been about the same since it appeared.  No injuries that she reports.  No drainage or pus.  No treatment.  No other concerns.         Objective:  Physical Exam  General: AAO x3, NAD  Dermatological: Growth noted on the distal lateral aspect of right second toe as pictured below.  It is firm in nature.  No edema, erythema or signs of infection. Nails are hypertrophic, dystrophic, brittle, discolored, elongated 10. No surrounding redness or drainage. Tenderness nails 1-5 bilaterally. No open lesions or pre-ulcerative lesions are identified today.    Vascular: Dorsalis Pedis artery and Posterior Tibial artery pedal pulses are 2/4 bilateral with immedate capillary fill time.  There is no pain with calf compression, swelling, warmth, erythema.   Neruologic: Grossly intact via light touch bilateral.   Musculoskeletal: Hammertoes present.  Gait: Unassisted, Nonantalgic.       Assessment:   72 year old female with symptomatic onychomycosis, skin graft     Plan:  -Treatment options discussed including all alternatives, risks, and complications -Etiology of symptoms were discussed -X-rays obtained reviewed.  3 views of the right foot were obtained.  No evidence of acute fracture.  Soft tissue lesion noted on the second digit. -She was to proceed with excision of skin growth.  Discussed both surgical as well as conservative measures.  She wants to consider and proceed with surgical intervention.  We discussed with her excision of the skin lesion.  Due to this in the operating room.  Will get  medical clearance prior to surgery. -The incision placement as well as the postoperative course was discussed with the patient. I discussed risks of the surgery which include, but not limited to, infection, bleeding, pain, swelling, need for further surgery, delayed or nonhealing, painful or ugly scar, numbness or sensation changes, over/under correction, recurrence, transfer lesions, further deformity, DVT/PE, loss of toe/foot. Patient understands these risks and wishes to proceed with surgery. The surgical consent was reviewed with the patient all 3 pages were signed. No promises or guarantees were given to the outcome of the procedure. All questions were answered to the best of my ability. Before the surgery the patient was encouraged to call the office if there is any further questions. The surgery will be performed at the [facility] on an outpatient basis. -Nails sharply debrided x 10 without any complications or bleeding  Vivi Barrack DPM

## 2023-06-14 ENCOUNTER — Encounter: Payer: Self-pay | Admitting: Gastroenterology

## 2023-06-14 ENCOUNTER — Encounter: Payer: Self-pay | Admitting: Psychiatry

## 2023-06-16 ENCOUNTER — Other Ambulatory Visit: Payer: Self-pay | Admitting: Family Medicine

## 2023-06-19 ENCOUNTER — Other Ambulatory Visit: Payer: Self-pay

## 2023-06-19 ENCOUNTER — Telehealth: Payer: Self-pay | Admitting: Family Medicine

## 2023-06-19 MED ORDER — LISINOPRIL 40 MG PO TABS: 40.0000 mg | ORAL_TABLET | Freq: Every day | ORAL | 0 refills | Status: DC

## 2023-06-19 NOTE — Telephone Encounter (Signed)
Patient called and would like a refill on lisinopril (ZESTRIL) 40 MG tablet   [facility] [address] [facility] [place].

## 2023-06-19 NOTE — Telephone Encounter (Signed)
Refill sent.

## 2023-06-21 ENCOUNTER — Encounter: Payer: Self-pay | Admitting: Gastroenterology

## 2023-06-21 ENCOUNTER — Other Ambulatory Visit: Payer: Self-pay | Admitting: Family Medicine

## 2023-06-21 ENCOUNTER — Ambulatory Visit: Payer: PPO | Admitting: Gastroenterology

## 2023-06-21 VITALS — BP 80/50 | HR 82 | Ht 59.0 in | Wt 124.4 lb

## 2023-06-21 DIAGNOSIS — K649 Unspecified hemorrhoids: Secondary | ICD-10-CM

## 2023-06-21 DIAGNOSIS — K5909 Other constipation: Secondary | ICD-10-CM

## 2023-06-21 DIAGNOSIS — Z634 Disappearance and death of family member: Secondary | ICD-10-CM

## 2023-06-21 DIAGNOSIS — Z860100 Personal history of colon polyps, unspecified: Secondary | ICD-10-CM

## 2023-06-21 MED ORDER — SUTAB 1479-225-188 MG PO TABS: 24.0000 | ORAL_TABLET | Freq: Once | ORAL | 0 refills | Status: AC

## 2023-06-21 MED ORDER — HYDROCORTISONE (PERIANAL) 2.5 % EX CREA: 1.0000 | TOPICAL_CREAM | Freq: Two times a day (BID) | CUTANEOUS | 1 refills | Status: DC

## 2023-06-21 NOTE — Progress Notes (Signed)
06/21/2023 MAECIE KILZER [id number] 10-Mar-1951   HISTORY OF PRESENT ILLNESS: This is a 72 year old female who was remotely a patient of Dr. Regino Schultze apparently.  Her last colonoscopy appears to have been in 2017 at an outside facility.  It looks like she had 9 polyps removed at that time but I do not see pathology on those.  Then in May 2021 her PCP ordered a Cologuard and it was negative at that time.  She struggles with chronic constipation.  She was on Linzess 290 mcg daily for 6 or 7 years and did really well with that.  Then more recently she has become constipated again.  Her PCP put her on Amitiza 24 mcg twice daily, but she says that that caused headaches.  She is now back on the Linzess 290 mcg daily but is taking that along with over-the-counter bisacodyl and stool softeners and is still having issues with moving her bowels.  Having to disimpact herself and will go a week at a time without a bowel movement.  Hemorrhoids are very irritated so is seeing superficial bright red blood related to that.   Past Medical History:  Diagnosis Date   Anxiety    Benzodiazepine dependence (HCC)    Colonic polyp    mulitple polyps. repeat in 05/2019   Constipation    IBS   COPD (chronic obstructive pulmonary disease) (HCC)    Depression    GERD (gastroesophageal reflux disease)    Hyperlipidemia    Hypertension    Insomnia    Migraine headache    OAB (overactive bladder)    Osteoarthritis    Osteoporosis 12/19/2017   Post-surgical hypothyroidism    Pulmonary embolism (HCC)    Eliquis to stop 05/03/19   Scoliosis    Past Surgical History:  Procedure Laterality Date   ABDOMINAL AORTOGRAM W/LOWER EXTREMITY Bilateral 03/24/2020   Procedure: ABDOMINAL AORTOGRAM W/LOWER EXTREMITY;  Surgeon: Nada Libman, MD;  Location: [facility];  Service: Cardiovascular;  Laterality: Bilateral;   BREAST CYST ASPIRATION Left    HEMORRHOID SURGERY     PERIPHERAL VASCULAR INTERVENTION Bilateral  03/24/2020   Procedure: PERIPHERAL VASCULAR INTERVENTION;  Surgeon: Nada Libman, MD;  Location: [facility];  Service: Cardiovascular;  Laterality: Bilateral;  ILIAC STENTS   THYROIDECTOMY     at [facility]   TUBAL LIGATION  1979    reports that she has been smoking cigarettes. She has a 44 pack-year smoking history. She has never used smokeless tobacco. She reports that she does not drink alcohol and does not use drugs. family history includes Alcohol abuse in her father; Anxiety disorder in her brother, maternal grandmother, maternal uncle, mother, sister, sister, and sister; Arthritis in her mother; Asthma in her father; Breast cancer in her mother; COPD in her father; Drug abuse in her son; Hearing loss in her mother; Heart attack in her maternal grandmother; Hyperlipidemia in her father, mother, and son; Hypertension in her brother, father, mother, sister, sister, and sister; Lung cancer in her maternal grandmother and maternal uncle; Thyroid cancer in her sister. No Known Allergies    Outpatient Encounter Medications as of 06/21/2023  Medication Sig   acetaminophen (TYLENOL) 500 MG tablet Take 1,000 mg by mouth every 6 (six) hours as needed for moderate pain.   albuterol (VENTOLIN HFA) 108 (90 Base) MCG/ACT inhaler INHALE 2 PUFFS INTO THE LUNGS EVERY 4 HOURS AS NEEDED FOR WHEEZING OR SHORTNESS OF BREATH.   atorvastatin (LIPITOR) 40 MG  tablet TAKE 1 TABLET BY MOUTH EVERYDAY AT BEDTIME   budesonide-formoterol (SYMBICORT) 160-4.5 MCG/ACT inhaler Inhale 2 puffs into the lungs 2 (two) times daily.   docusate sodium (COLACE) 100 MG capsule Take 100-200 mg by mouth at bedtime. Patient taking 4-6 capsules daily   [START ON 06/29/2023] gabapentin (NEURONTIN) 400 MG capsule TAKE 1 CAPSULE BY MOUTH TWICE A DAY AND 4 CAPSULES AT BEDTIME   levothyroxine (SYNTHROID) 100 MCG tablet TAKE 1 TABLET BY MOUTH EVERY DAY   linaclotide (LINZESS) 290 MCG CAPS capsule Take 290 mcg by mouth daily before  breakfast.   lisinopril (ZESTRIL) 40 MG tablet Take 1 tablet (40 mg total) by mouth daily.   mirtazapine (REMERON) 45 MG tablet Take 1 tablet (45 mg total) by mouth at bedtime.   MYRBETRIQ 50 MG TB24 tablet TAKE 1 TABLET BY MOUTH EVERY DAY   prazosin (MINIPRESS) 1 MG capsule Take 2 mg by mouth at bedtime.   prazosin (MINIPRESS) 5 MG capsule TAKE 2 CAPSULES BY MOUTH DAILY AT BEDTIME.   diazepam (VALIUM) 5 MG tablet Take 1 tablet (5 mg total) by mouth in the morning, at noon, and at bedtime.   fenofibrate (TRICOR) 145 MG tablet Take 1 tablet (145 mg total) by mouth daily.   risperiDONE (RISPERDAL) 1 MG tablet TAKE 1 TABLET BY MOUTH EVERYDAY AT BEDTIME   [DISCONTINUED] lubiprostone (AMITIZA) 24 MCG capsule Take 1 capsule (24 mcg total) by mouth 2 (two) times daily with a meal.   No facility-administered encounter medications on file as of 06/21/2023.    REVIEW OF SYSTEMS  : All other systems reviewed and negative except where noted in the History of Present Illness.   PHYSICAL EXAM: BP (!) 80/50   Pulse 82   Ht 4\' 11"  (1.499 m)   Wt 124 lb 6.4 oz (56.4 kg)   BMI 25.13 kg/m  General: Well developed white female in no acute distress Head: Normocephalic and atraumatic Eyes:   Sclerae anicteric, conjunctiva pink. Ears: Normal auditory acuity Lungs: Clear throughout to auscultation; no W/R/R. Heart: Regular rate and rhythm; no M/R/G. Abdomen: Soft, non-distended.  BS present.  Non-tender. Rectal:  Will be done at the time of colonoscopy. Musculoskeletal: Symmetrical with no gross deformities  Skin: No lesions on visible extremities Extremities: No edema  Neurological: Alert oriented x 4, grossly non-focal Psychological:  Alert and cooperative. Normal mood and affect  ASSESSMENT AND PLAN: *Personal history of colon polyps: She had 9 colon polyps removed in 2017.  I do not see any pathology on them, however.  Then her PCP ordered a Cologuard in 2021 that was negative.  Will plan for  repeat colonoscopy with Dr. Meridee Score.  She will need a 2-day bowel prep and would like to do Sutab.  The risks, benefits, and alternatives to colonoscopy were discussed with the patient and she consents to proceed.  *Chronic constipation: Previously did well for years with Linzess 290 mcg daily.  Amitiza gave her a headache.  Will give samples of Trulance 3 mg to begin taking daily.  Other options include Motegrity, Movantik, and Ibsrela.  May have a component of pelvic floor dysfunction as well as she often has to disimpact herself, but it may just be more so the stools are very large and hard.  Likely some the constipation due to medication side effects.  Also has thyroid issues and the last labs that I see from August 2023 her TSH was very high. *Hemorrhoids: Complaining of hemorrhoids being irritated recently due to  hard stools and having to disimpact herself.  Will prescribe hydrocortisone cream.  Sent to pharmacy.   CC:  Sharlene Dory*

## 2023-06-21 NOTE — Patient Instructions (Addendum)
We have sent the following medications to your pharmacy for you to pick up at your convenience: Hydrocortisone cream twice daily.  We have given you samples of the following medication to take: Trulance 3 mg daily.   You have been scheduled for a colonoscopy. Please follow written instructions given to you at your visit today.   Please pick up your prep supplies at the pharmacy within the next 1-3 days.  If you use inhalers (even only as needed), please bring them with you on the day of your procedure.  DO NOT TAKE 7 DAYS PRIOR TO TEST- Trulicity (dulaglutide) Ozempic, Wegovy (semaglutide) Mounjaro (tirzepatide) Bydureon Bcise (exanatide extended release)  DO NOT TAKE 1 DAY PRIOR TO YOUR TEST Rybelsus (semaglutide) Adlyxin (lixisenatide) Victoza (liraglutide) Byetta (exanatide)  _______________________________________________________  If your blood pressure at your visit was 140/90 or greater, please contact your primary care physician to follow up on this.  _______________________________________________________  If you are age 63 or older, your body mass index should be between 23-30. Your Body mass index is 25.13 kg/m. If this is out of the aforementioned range listed, please consider follow up with your Primary Care Provider.  If you are age 75 or younger, your body mass index should be between 19-25. Your Body mass index is 25.13 kg/m. If this is out of the aformentioned range listed, please consider follow up with your Primary Care Provider.   ________________________________________________________  The [facility] providers would like to encourage you to use [facility] to communicate with providers for non-urgent requests or questions.  Due to long hold times on the telephone, sending your provider a message by [facility] may be a faster and more efficient way to get a response.  Please allow 48 business hours for a response.  Please remember that this is for non-urgent  requests.  _______________________________________________________

## 2023-06-22 NOTE — Telephone Encounter (Signed)
10 mg nightly. This has been rectified in her chart and the refill has been sent.

## 2023-06-22 NOTE — Progress Notes (Signed)
Attending Physician's Attestation   I have reviewed the chart.   I agree with the Advanced Practitioner's note, impression, and recommendations with any updates as below. Colonoscopy is reasonable.  Hopefully Trulance helps and she will call to let us know so we can transition her medication or try another sample.  Anusol suppositories should be considered as well.   Corliss Parish, MD [facility]oscopy Office # [phone]

## 2023-06-23 ENCOUNTER — Telehealth: Payer: Self-pay | Admitting: Gastroenterology

## 2023-06-23 NOTE — Telephone Encounter (Signed)
Spoke with pt and instructed her on how to do the miralax purge. She verbalized understanding and knows to call back on Monday if she is still having problems.

## 2023-06-23 NOTE — Telephone Encounter (Signed)
Pt states Shanda Bumps gave her some samples to take when she was here. She report she took them on Wed, Thurs and this am. States she is having to put a glove on and pull the stool out of her bottom. Reports she cannot do an enema. She wants to know what to do. Please advise.

## 2023-06-23 NOTE — Telephone Encounter (Signed)
Inbound call from patient Trulance medication has not been helping symptoms. Patient is requesting a call to discuss alternative medication. Please advise, thank you.

## 2023-06-26 ENCOUNTER — Telehealth: Payer: Self-pay | Admitting: Family Medicine

## 2023-06-26 ENCOUNTER — Telehealth: Payer: Self-pay | Admitting: Psychiatry

## 2023-06-26 ENCOUNTER — Telehealth (INDEPENDENT_AMBULATORY_CARE_PROVIDER_SITE_OTHER): Payer: PPO | Admitting: Family Medicine

## 2023-06-26 ENCOUNTER — Encounter: Payer: Self-pay | Admitting: Family Medicine

## 2023-06-26 DIAGNOSIS — F411 Generalized anxiety disorder: Secondary | ICD-10-CM

## 2023-06-26 DIAGNOSIS — K5909 Other constipation: Secondary | ICD-10-CM | POA: Diagnosis not present

## 2023-06-26 DIAGNOSIS — F5101 Primary insomnia: Secondary | ICD-10-CM

## 2023-06-26 DIAGNOSIS — F39 Unspecified mood [affective] disorder: Secondary | ICD-10-CM

## 2023-06-26 MED ORDER — GABAPENTIN 400 MG PO CAPS: ORAL_CAPSULE | ORAL | 0 refills | Status: DC

## 2023-06-26 MED ORDER — LACTULOSE 10 GM/15ML PO SOLN: 10.0000 g | Freq: Two times a day (BID) | ORAL | 1 refills | Status: DC

## 2023-06-26 MED ORDER — MIRTAZAPINE 30 MG PO TABS: ORAL_TABLET | ORAL | 0 refills | Status: DC

## 2023-06-26 NOTE — Telephone Encounter (Signed)
Instructions sent via MyChart, as well as reviewed with patient. Told her to let us know if she had any questions.

## 2023-06-26 NOTE — Telephone Encounter (Signed)
Pt called and said that she would like to know how to wean of gabapentin and remeron. She said that she doesn't need them any more. Please send to her pharmacy  [facility] [address] in [place]. Please call with instructions [phone]

## 2023-06-26 NOTE — Telephone Encounter (Signed)
Patient current taking 45 mg of Remeron and gabapentin 400 mg BID and 4 at bedtime. She reports doing well and would like to wean off these 2 medications. She said she praises the Golinda every day and doing daily devotions. She does report significant constipation and is trying to come off some meds to try to help with this. She is on several medications from her PCP for constipation.

## 2023-06-26 NOTE — Telephone Encounter (Signed)
Pt called & wanted to inform PCP that her mediation, lubiprostone (AMITIZA) 24 MCG capsule, has been causing headaches.   She is requesting for lactulose to be sent in. Please call and advise pt.

## 2023-06-26 NOTE — Telephone Encounter (Signed)
I've never rx'd her this, she would need appt. She also saw the GI team 5 d ago and was given samples of Trulance. I would rec trying that first.

## 2023-06-26 NOTE — Progress Notes (Signed)
CC: Constipation  Subjective: Patient is a 72 y.o. female here for constipation. We are interacting via web portal for an electronic face-to-face visit. I verified patient's ID using 2 identifiers. Patient agreed to proceed with visit via this method. Patient is at home, I am at office. Patient and I are present for visit.   Pt has a longstanding hx of constipation. She has done well w Amitiza in the past. She started to have HA's a/w it and lack of efficacy. Linzess was effective for a while, but then stopped working. She was given samples of Trulance which was not particularly helpful.   Past Medical History:  Diagnosis Date   Anxiety    Benzodiazepine dependence (HCC)    Colonic polyp    mulitple polyps. repeat in 05/2019   Constipation    IBS   COPD (chronic obstructive pulmonary disease) (HCC)    Depression    GERD (gastroesophageal reflux disease)    Hyperlipidemia    Hypertension    Insomnia    Migraine headache    OAB (overactive bladder)    Osteoarthritis    Osteoporosis 12/19/2017   Post-surgical hypothyroidism    Pulmonary embolism (HCC)    Eliquis to stop 05/03/19   Scoliosis     Objective: No conversational dyspnea Age appropriate judgment and insight Nml affect and mood  Assessment and Plan: Chronic constipation - Plan: lactulose (CHRONULAC) 10 GM/15ML solution  Chronic, unstable. Stop Trulance. Start Lactulose 10 g bid. Stay hydrated. Keep up on fiber intake. F/u in 1 mo.  The patient voiced understanding and agreement to the plan.  Jilda Roche [place], DO 06/26/23  3:36 PM

## 2023-06-26 NOTE — Telephone Encounter (Signed)
Please let pt know scripts have been sent to decrease her medications as requested. Script sent for Remeron 30 mg tablets with instructions to take 1 tablet at bedtime for 10 days, then 1/2 tablet at bedtime for 10 days, then stop. Script also sent to decrease total daily dose of Gabapentin by one capsule each week until completed. Please let her know that Gabapentin also is used to treat pain and neuropathy and that if she notices anything like this with decreasing Gabapentin to contact office. Can continue at lowest possible effective dose if this occurs. Please advise her to contact office with any worsening symptoms or questions.

## 2023-06-28 ENCOUNTER — Other Ambulatory Visit (HOSPITAL_BASED_OUTPATIENT_CLINIC_OR_DEPARTMENT_OTHER): Payer: Self-pay

## 2023-07-01 ENCOUNTER — Other Ambulatory Visit: Payer: Self-pay | Admitting: Family Medicine

## 2023-07-03 ENCOUNTER — Telehealth: Payer: Self-pay | Admitting: Family Medicine

## 2023-07-03 ENCOUNTER — Telehealth (HOSPITAL_BASED_OUTPATIENT_CLINIC_OR_DEPARTMENT_OTHER): Payer: Self-pay

## 2023-07-03 ENCOUNTER — Telehealth (INDEPENDENT_AMBULATORY_CARE_PROVIDER_SITE_OTHER): Payer: PPO | Admitting: Family Medicine

## 2023-07-03 ENCOUNTER — Encounter: Payer: Self-pay | Admitting: Family Medicine

## 2023-07-03 DIAGNOSIS — F17210 Nicotine dependence, cigarettes, uncomplicated: Secondary | ICD-10-CM

## 2023-07-03 DIAGNOSIS — K5909 Other constipation: Secondary | ICD-10-CM | POA: Diagnosis not present

## 2023-07-03 DIAGNOSIS — Z1231 Encounter for screening mammogram for malignant neoplasm of breast: Secondary | ICD-10-CM | POA: Diagnosis not present

## 2023-07-03 MED ORDER — LINACLOTIDE 290 MCG PO CAPS: 290.0000 ug | ORAL_CAPSULE | Freq: Every day | ORAL | 2 refills | Status: DC

## 2023-07-03 MED ORDER — LACTULOSE 10 GM/15ML PO SOLN: 10.0000 g | Freq: Two times a day (BID) | ORAL | 1 refills | Status: DC

## 2023-07-03 NOTE — Progress Notes (Signed)
CC: Constipation  Subjective: Patient is a 72 y.o. female here for constipation f/u. We are interacting via web portal for an electronic face-to-face visit. I verified patient's ID using 2 identifiers. Patient agreed to proceed with visit via this method. Patient is at home, I am at office. Patient and I are present for visit.   Pt took lactulose from last week. She idd not have a satisfactory BM. She took some leftover Linzess 290 mcg. She took that with the lactulose with it and she finally had a "smooth bowel movement". +bloating which was resolved after her BM. Very please she finally had a BM. No pain, bleeding, fevers, N/V. She is still passing gas.   Past Medical History:  Diagnosis Date   Anxiety    Benzodiazepine dependence (HCC)    Colonic polyp    mulitple polyps. repeat in 05/2019   Constipation    IBS   COPD (chronic obstructive pulmonary disease) (HCC)    Depression    GERD (gastroesophageal reflux disease)    Hyperlipidemia    Hypertension    Insomnia    Migraine headache    OAB (overactive bladder)    Osteoarthritis    Osteoporosis 12/19/2017   Post-surgical hypothyroidism    Pulmonary embolism (HCC)    Eliquis to stop 05/03/19   Scoliosis     Objective: No conversational dyspnea Age appropriate judgment and insight Nml affect and mood  Assessment and Plan: Chronic constipation - Plan: lactulose (CHRONULAC) 10 GM/15ML solution  Encounter for screening mammogram for malignant neoplasm of breast - Plan: MM DIGITAL SCREENING BILATERAL  Smoking greater than 20 pack years - Plan: CT CHEST LUNG CANCER SCREENING LOW DOSE WO CONTRAST  Chronic, unstable.  Continue lactulose 10 g 1-2 times daily, add back Linzess 290 mcg daily as this did seem to work well for her.  Stay hydrated and keep fiber intake up. Mammogram ordered. Low-dose CT scan ordered. She did cancel her colonoscopy, will order a Cologuard kit again. The patient voiced understanding and agreement to  the plan.  Jilda Roche [place], DO 07/03/23  12:01 PM

## 2023-07-03 NOTE — Telephone Encounter (Signed)
Spoke with pt and has appt to discuss, Pt requested Linaclotie 290 mcg capsule. She stated not able to hold prune juice down and not able to use suppository that her rectum muscles are to weak.

## 2023-07-03 NOTE — Telephone Encounter (Signed)
Patient called and would like a call back. She states has severe constipation and need something called in. Please call.

## 2023-07-03 NOTE — Telephone Encounter (Signed)
Called pt was advised Try 2 tablespoons of milk of mag in 4 oz of warm prune juice. Do that and wait a couple hours. If no improvement, try a Dulcolax suppository and then let me know if we are still having issue

## 2023-07-21 ENCOUNTER — Other Ambulatory Visit: Payer: Self-pay | Admitting: Psychiatry

## 2023-07-21 DIAGNOSIS — F411 Generalized anxiety disorder: Secondary | ICD-10-CM

## 2023-07-21 DIAGNOSIS — F39 Unspecified mood [affective] disorder: Secondary | ICD-10-CM

## 2023-07-21 DIAGNOSIS — F5101 Primary insomnia: Secondary | ICD-10-CM

## 2023-08-03 ENCOUNTER — Telehealth: Payer: Self-pay

## 2023-08-03 MED ORDER — MIRABEGRON ER 50 MG PO TB24: 50.0000 mg | ORAL_TABLET | Freq: Every day | ORAL | 1 refills | Status: DC

## 2023-08-03 NOTE — Telephone Encounter (Signed)
 Copied from CRM [id number]. Topic: Clinical - Medication Refill >> Aug 03, 2023  9:53 AM Latavia C wrote: Most Recent Primary Care Visit:  Provider: FRANN MABEL MT  Department: [facility]  Visit Type: MYCHART VIDEO VISIT  Date: 07/03/2023  Medication: MYRBETRIQ  50 MG TB24 tablet  Has the patient contacted their pharmacy? Yes (Agent: If no, request that the patient contact the pharmacy for the refill. If patient does not wish to contact the pharmacy document the reason why and proceed with request.) (Agent: If yes, when and what did the pharmacy advise?)  Is this the correct pharmacy for this prescription? Yes If no, delete pharmacy and type the correct one.  This is the patient's preferred pharmacy:  [phone] [place], [place] - [address] [place] [address] Phone: [phone] Fax: [phone]   Has the prescription been filled recently? Yes  Is the patient out of the medication? Yes  Has the patient been seen for an appointment in the last year OR does the patient have an upcoming appointment? Yes  Can we respond through MyChart? Yes  Agent: Please be advised that Rx refills may take up to 3 business days. We ask that you follow-up with your pharmacy.

## 2023-08-03 NOTE — Telephone Encounter (Signed)
 Rx sent

## 2023-08-10 ENCOUNTER — Other Ambulatory Visit: Payer: Self-pay | Admitting: Family Medicine

## 2023-08-10 DIAGNOSIS — K5900 Constipation, unspecified: Secondary | ICD-10-CM

## 2023-08-17 ENCOUNTER — Ambulatory Visit: Payer: PPO | Admitting: Podiatry

## 2023-08-17 ENCOUNTER — Encounter: Payer: Self-pay | Admitting: Podiatry

## 2023-08-17 DIAGNOSIS — M79674 Pain in right toe(s): Secondary | ICD-10-CM | POA: Diagnosis not present

## 2023-08-17 DIAGNOSIS — M79675 Pain in left toe(s): Secondary | ICD-10-CM | POA: Diagnosis not present

## 2023-08-17 DIAGNOSIS — B351 Tinea unguium: Secondary | ICD-10-CM | POA: Diagnosis not present

## 2023-08-17 DIAGNOSIS — L84 Corns and callosities: Secondary | ICD-10-CM

## 2023-08-17 NOTE — Progress Notes (Signed)
  Subjective:  Patient ID: Brenda Dunn, female    DOB: 14-Dec-1950,   MRN: [id number]  No chief complaint on file.   73 y.o. female presents for concern of thickened elongated and painful nails that are difficult to trim. Requesting to have them trimmed today.  She has regularly followed with Dr. Ardelle Anton in the past. Relates she has a growth on her right second toe. She was scheduled to have surgical removal but her husband past and was not sure if she could go through recover alone.   PCP:  Sharlene Dory, DO    . Denies any other pedal complaints. Denies n/v/f/c.   Past Medical History:  Diagnosis Date   Anxiety    Benzodiazepine dependence (HCC)    Colonic polyp    mulitple polyps. repeat in 05/2019   Constipation    IBS   COPD (chronic obstructive pulmonary disease) (HCC)    Depression    GERD (gastroesophageal reflux disease)    Hyperlipidemia    Hypertension    Insomnia    Migraine headache    OAB (overactive bladder)    Osteoarthritis    Osteoporosis 12/19/2017   Post-surgical hypothyroidism    Pulmonary embolism (HCC)    Eliquis to stop 05/03/19   Scoliosis     Objective:  Physical Exam: Vascular: DP/PT pulses 2/4 bilateral. CFT <3 seconds. Normal hair growth on digits. No edema.  Skin. No lacerations or abrasions bilateral feet. Nails 1-5 bilateral are thickened dystrophic and elongated. Growth noted to lateral second digit on right. Firms in nature with about 2 cm protruberance from lateral nail fold. Hyeprkeratotic lesion noted to medial first metatarsal on right.  Musculoskeletal: MMT 5/5 bilateral lower extremities in DF, PF, Inversion and Eversion. Deceased ROM in DF of ankle joint.  Neurological: Sensation intact to light touch.   Assessment:   1. Dermatophytosis of nail   2. Pain in toes of both feet   3. Callus      Plan:  Patient was evaluated and treated and all questions answered. -Will continue to monitor growth and as long as no changes  will defer surgical intervention for now.  -Mechanically debrided all nails 1-5 bilateral using sterile nail nipper and filed with dremel without incident  -Answered all patient questions -Patient to return  in 3 months for at risk foot care -Patient advised to call the office if any problems or questions arise in the meantime.   Louann Sjogren, DPM

## 2023-08-22 ENCOUNTER — Encounter: Payer: PPO | Admitting: Gastroenterology

## 2023-08-30 ENCOUNTER — Ambulatory Visit: Payer: PPO

## 2023-09-07 ENCOUNTER — Other Ambulatory Visit: Payer: Self-pay | Admitting: Family Medicine

## 2023-09-08 ENCOUNTER — Telehealth: Payer: Self-pay

## 2023-09-08 NOTE — Telephone Encounter (Signed)
 Copied from CRM [id number]. Topic: Clinical - Medication Question >> Sep 08, 2023  3:14 PM Leotis ORN wrote: Reason for CRM: patient stated that she is aware Dr. Frann is on vacation, she wanted to schedule a virtual appt with him for 09/11/23 ,I  informed her his first availability isnt until 09/15/23, she states she has a few tablets of her prazosin  (MINIPRESS ) 5 MG capsule and prazosin  (MINIPRESS ) 1 MG capsule, she stated when she went to get a refill the pharmacy sent her a text that is was denied: however,   She is wanting to taper off the medication and discontinue it, please call patient back at [phone] to discuss how to proceed with coming off of the medication since she can't speak

## 2023-09-09 NOTE — Telephone Encounter (Signed)
 She can start with just 5 mg nightly until we speak. Ty.

## 2023-09-11 ENCOUNTER — Other Ambulatory Visit: Payer: Self-pay

## 2023-09-11 DIAGNOSIS — Z634 Disappearance and death of family member: Secondary | ICD-10-CM

## 2023-09-11 MED ORDER — PRAZOSIN HCL 5 MG PO CAPS: 10.0000 mg | ORAL_CAPSULE | Freq: Every day | ORAL | 0 refills | Status: DC

## 2023-09-11 NOTE — Telephone Encounter (Signed)
 Refill sent and pt was made aware and appt scheduled.

## 2023-09-21 ENCOUNTER — Other Ambulatory Visit: Payer: Self-pay | Admitting: Family Medicine

## 2023-09-21 DIAGNOSIS — Z634 Disappearance and death of family member: Secondary | ICD-10-CM

## 2023-09-22 ENCOUNTER — Telehealth: Payer: Self-pay

## 2023-09-22 ENCOUNTER — Other Ambulatory Visit: Payer: Self-pay | Admitting: Family Medicine

## 2023-09-22 NOTE — Telephone Encounter (Signed)
Called spoke with Pt was advised per Dr.[name] 200 mg tabs of magnesium at the pharmacy (OTC) and take that for 1-2 weeks and follow up with [place] to see how she is doing at that point. Pt stated understand.

## 2023-09-22 NOTE — Telephone Encounter (Signed)
Copied from CRM [id number]. Topic: General - Other >> Sep 22, 2023  1:38 PM Truddie Crumble wrote: Reason for CRM: patient called stating prazosin is not helping her sleep through night and she would like something else to be called in

## 2023-09-28 ENCOUNTER — Other Ambulatory Visit: Payer: Self-pay | Admitting: Family Medicine

## 2023-10-02 ENCOUNTER — Ambulatory Visit (INDEPENDENT_AMBULATORY_CARE_PROVIDER_SITE_OTHER): Payer: PPO | Admitting: Physician Assistant

## 2023-10-02 ENCOUNTER — Encounter: Payer: Self-pay | Admitting: Physician Assistant

## 2023-10-02 ENCOUNTER — Telehealth: Payer: PPO | Admitting: Psychiatry

## 2023-10-02 DIAGNOSIS — F411 Generalized anxiety disorder: Secondary | ICD-10-CM | POA: Diagnosis not present

## 2023-10-02 DIAGNOSIS — F5101 Primary insomnia: Secondary | ICD-10-CM | POA: Diagnosis not present

## 2023-10-02 MED ORDER — DIAZEPAM 5 MG PO TABS: 5.0000 mg | ORAL_TABLET | Freq: Three times a day (TID) | ORAL | 1 refills | Status: DC

## 2023-10-02 NOTE — Progress Notes (Unsigned)
 [facility]  Patient ID: Brenda Dunn,  MRN: [id number]  PCP: Sharlene Dory, DO  Date of Evaluation: 10/02/2023 Time spent:25 minutes  Chief Complaint:  Chief Complaint   Anxiety    HISTORY/CURRENT STATUS: HPI Transferring to my care from Corie Chiquito, NP who is no longer with the practice.   She has had anxiety all of her life.  She was started on Valium when she was 73 years old.  Without it she cannot function due to the anxiety.  She feels panicky, like something bad will happen at any time.  The Valium helps with the emotional and physical symptoms of shortness of breath and perception of tachycardia.  She has experienced a lot of loss in the past 9 years.  She was already an anxious person but these losses have made it even worse.  Son died 10-14-2014, mom died in 2016/10/13, granddtr died from OD 10/14/2019, husband died 13-Oct-2020 from COVID, Brother shot himself last August.   She stopped taking the Risperdal.  Did not feel like it was doing much of anything.  The gabapentin had been weaned at her last visit.  At this time her mood is good except for the anxiety. Patient is able to enjoy things.  Energy and motivation are fair to good.  No extreme sadness, tearfulness, or feelings of hopelessness.  Even with the Valium she still does not sleep well.  She asks if she can take 2 at bedtime.  But then she would not have enough for the day.  She has trouble both falling asleep and staying asleep.  ADLs and personal hygiene are normal.   Denies any changes in concentration, making decisions, or remembering things.  Appetite has not changed.  Weight is stable.  Denies suicidal or homicidal thoughts.  Patient denies increased energy with decreased need for sleep, increased talkativeness, racing thoughts, impulsivity or risky behaviors, increased spending, increased libido, grandiosity, increased irritability or anger, paranoia, or hallucinations.  Denies dizziness, syncope, seizures,  numbness, tingling, tremor, tics, unsteady gait, slurred speech, confusion. Denies muscle or joint pain, stiffness, or dystonia. Denies unexplained weight loss, frequent infections, or sores that heal slowly.  No polyphagia, polydipsia, or polyuria. Denies visual changes or paresthesias.   Individual Medical History/ Review of Systems: Changes? :No   Past Psychiatric Medication Trials: Remeron- Had worsening depression and insomnia with 30 mg dose. Olanzapine Seroquel  Risperdal-helpful for mood, anxiety, and insomnia Diazepam Gabapentin Trazodone Trileptal- hyponatremia Ambien  Allergies: Patient has no known allergies.  Current Medications:  Current Outpatient Medications:    acetaminophen (TYLENOL) 500 MG tablet, Take 1,000 mg by mouth every 6 (six) hours as needed for moderate pain., Disp: , Rfl:    albuterol (VENTOLIN HFA) 108 (90 Base) MCG/ACT inhaler, INHALE 2 PUFFS BY MOUTH EVERY 4 HOURS AS NEEDED FOR WHEEZE OR FOR SHORTNESS OF BREATH, Disp: 13.4 each, Rfl: 3   atorvastatin (LIPITOR) 40 MG tablet, TAKE 1 TABLET BY MOUTH EVERYDAY AT BEDTIME, Disp: 90 tablet, Rfl: 1   budesonide-formoterol (SYMBICORT) 160-4.5 MCG/ACT inhaler, Inhale 2 puffs into the lungs 2 (two) times daily., Disp: , Rfl:    docusate sodium (COLACE) 100 MG capsule, Take 100-200 mg by mouth at bedtime. Patient taking 4-6 capsules daily, Disp: , Rfl:    fenofibrate (TRICOR) 145 MG tablet, Take 1 tablet (145 mg total) by mouth daily., Disp: 90 tablet, Rfl: 3   lactulose (CHRONULAC) 10 GM/15ML solution, Take 15 mLs (10 g total) by mouth 2 (two) times  daily., Disp: 946 mL, Rfl: 1   levothyroxine (SYNTHROID) 100 MCG tablet, TAKE 1 TABLET BY MOUTH EVERY DAY, Disp: 90 tablet, Rfl: 1   linaclotide (LINZESS) 290 MCG CAPS capsule, Take 1 capsule (290 mcg total) by mouth daily before breakfast., Disp: 30 capsule, Rfl: 2   lisinopril (ZESTRIL) 40 MG tablet, Take 1 tablet (40 mg total) by mouth daily., Disp: 90 tablet, Rfl: 0    mirabegron ER (MYRBETRIQ) 50 MG TB24 tablet, Take 1 tablet (50 mg total) by mouth daily., Disp: 90 tablet, Rfl: 1   diazepam (VALIUM) 5 MG tablet, Take 1 tablet (5 mg total) by mouth in the morning, at noon, and at bedtime. May take another 5 mg (=10 mg) at bedtime occas for sleep., Disp: 105 tablet, Rfl: 1   hydrocortisone (ANUSOL-HC) 2.5 % rectal cream, Place 1 Application rectally 2 (two) times daily. (Patient not taking: Reported on 10/02/2023), Disp: 30 g, Rfl: 1   prazosin (MINIPRESS) 5 MG capsule, TAKE 1 CAPSULE BY MOUTH DAILY AT BEDTIME WITH 1 MG PRAZOSIN (7 MG TOTAL DAILY DOSE) (Patient not taking: Reported on 10/02/2023), Disp: 90 capsule, Rfl: 1   risperiDONE (RISPERDAL) 1 MG tablet, TAKE 1 TABLET BY MOUTH EVERYDAY AT BEDTIME (Patient not taking: Reported on 10/02/2023), Disp: 90 tablet, Rfl: 1 Medication Side Effects: none  Family Medical/ Social History: Changes? No  MENTAL HEALTH EXAM:  There were no vitals taken for this visit.There is no height or weight on file to calculate BMI.  General Appearance: Casual and Well Groomed  Eye Contact:  Good  Speech:  Clear and Coherent and Normal Rate  Volume:  Normal  Mood:  Euthymic  Affect:  Congruent  Thought Process:  Goal Directed and Descriptions of Associations: Circumstantial  Orientation:  Full (Time, Place, and Person)  Thought Content: Logical   Suicidal Thoughts:  No  Homicidal Thoughts:  No  Memory:  WNL  Judgement:  Good  Insight:  Good  Psychomotor Activity:  Normal  Concentration:  Concentration: Good  Recall:  Good  Fund of Knowledge: Good  Language: Good  Assets:  Desire for Improvement Financial Resources/Insurance Housing Transportation  ADL's:  Intact  Cognition: WNL  Prognosis:  Good   DIAGNOSES:    ICD-10-CM   1. Generalized anxiety disorder  F41.1 diazepam (VALIUM) 5 MG tablet    2. Primary insomnia  F51.01 diazepam (VALIUM) 5 MG tablet     Receiving Psychotherapy: No   RECOMMENDATIONS:   PDMP  reviewed.  Valium filled 09/06/2023. I provided 25 minutes of face to face time during this encounter, including time spent before and after the visit in records review, medical decision making, counseling pertinent to today's visit, and charting.   We had a long disc session about benzodiazepines in the elderly.  She understands that any benzodiazepine increases the risk of confusion, imbalance, and falls and any sequela that causes.  She accepts those risks.  We also discussed using a benzodiazepine alone.  She has tried multiple other medications to help prevent the anxiety.  They have either caused side effects or did not help.  At this time I will keep her only on the benzo but when I reevaluate her in 6 weeks, depending on how she is doing, we will have to find another treatment to help prevent the anxiety as well as using the Valium as a rescue medication.  Continue Valium 5 mg, 1 p.o. every morning, 1 at noon, and 1 at bedtime routinely.  Occasionally she can  take an additional 5 mg at bedtime to help with sleep but not every night.  She verbalizes understanding. Discussed self-care, healthy lifestyle choices, and recommend counseling. Return in 6 weeks.  Melony Overly, PA-C

## 2023-11-06 ENCOUNTER — Ambulatory Visit (INDEPENDENT_AMBULATORY_CARE_PROVIDER_SITE_OTHER): Payer: PPO | Admitting: Family Medicine

## 2023-11-06 ENCOUNTER — Encounter: Payer: Self-pay | Admitting: Family Medicine

## 2023-11-06 VITALS — BP 124/84 | HR 82 | Ht 59.0 in | Wt 114.4 lb

## 2023-11-06 DIAGNOSIS — I10 Essential (primary) hypertension: Secondary | ICD-10-CM

## 2023-11-06 DIAGNOSIS — F17210 Nicotine dependence, cigarettes, uncomplicated: Secondary | ICD-10-CM

## 2023-11-06 DIAGNOSIS — N3281 Overactive bladder: Secondary | ICD-10-CM | POA: Diagnosis not present

## 2023-11-06 DIAGNOSIS — Z1211 Encounter for screening for malignant neoplasm of colon: Secondary | ICD-10-CM

## 2023-11-06 DIAGNOSIS — E890 Postprocedural hypothyroidism: Secondary | ICD-10-CM

## 2023-11-06 DIAGNOSIS — Z1231 Encounter for screening mammogram for malignant neoplasm of breast: Secondary | ICD-10-CM

## 2023-11-06 DIAGNOSIS — E782 Mixed hyperlipidemia: Secondary | ICD-10-CM | POA: Diagnosis not present

## 2023-11-06 DIAGNOSIS — R6889 Other general symptoms and signs: Secondary | ICD-10-CM | POA: Diagnosis not present

## 2023-11-06 LAB — CBC WITH DIFFERENTIAL/PLATELET
Basophils Absolute: 0 10*3/uL (ref 0.0–0.1)
Basophils Relative: 0.5 % (ref 0.0–3.0)
Eosinophils Absolute: 0 10*3/uL (ref 0.0–0.7)
Eosinophils Relative: 0.5 % (ref 0.0–5.0)
HCT: 38.2 % (ref 36.0–46.0)
Hemoglobin: 13 g/dL (ref 12.0–15.0)
Lymphocytes Relative: 24.8 % (ref 12.0–46.0)
Lymphs Abs: 1.1 10*3/uL (ref 0.7–4.0)
MCHC: 34 g/dL (ref 30.0–36.0)
MCV: 96.9 fl (ref 78.0–100.0)
Monocytes Absolute: 0.3 10*3/uL (ref 0.1–1.0)
Monocytes Relative: 6.5 % (ref 3.0–12.0)
Neutro Abs: 3 10*3/uL (ref 1.4–7.7)
Neutrophils Relative %: 67.7 % (ref 43.0–77.0)
Platelets: 176 10*3/uL (ref 150.0–400.0)
RBC: 3.94 Mil/uL (ref 3.87–5.11)
RDW: 13.3 % (ref 11.5–15.5)
WBC: 4.4 10*3/uL (ref 4.0–10.5)

## 2023-11-06 LAB — COMPREHENSIVE METABOLIC PANEL WITH GFR
ALT: 193 U/L — ABNORMAL HIGH (ref 0–35)
AST: 162 U/L — ABNORMAL HIGH (ref 0–37)
Albumin: 4.5 g/dL (ref 3.5–5.2)
Alkaline Phosphatase: 117 U/L (ref 39–117)
BUN: 9 mg/dL (ref 6–23)
CO2: 23 meq/L (ref 19–32)
Calcium: 9.8 mg/dL (ref 8.4–10.5)
Chloride: 105 meq/L (ref 96–112)
Creatinine, Ser: 0.79 mg/dL (ref 0.40–1.20)
GFR: 74.91 mL/min
Glucose, Bld: 128 mg/dL — ABNORMAL HIGH (ref 70–99)
Potassium: 3.4 meq/L — ABNORMAL LOW (ref 3.5–5.1)
Sodium: 138 meq/L (ref 135–145)
Total Bilirubin: 0.6 mg/dL (ref 0.2–1.2)
Total Protein: 6.9 g/dL (ref 6.0–8.3)

## 2023-11-06 LAB — LIPID PANEL
Cholesterol: 160 mg/dL (ref 0–200)
HDL: 42.3 mg/dL (ref >39.00)
LDL Cholesterol: 83 mg/dL (ref 0–99)
NonHDL: 117.75
Total CHOL/HDL Ratio: 4
Triglycerides: 172 mg/dL — ABNORMAL HIGH (ref 0.0–149.0)
VLDL: 34.4 mg/dL (ref 0.0–40.0)

## 2023-11-06 LAB — TSH: TSH: 0.21 u[IU]/mL — ABNORMAL LOW (ref 0.35–5.50)

## 2023-11-06 LAB — T4, FREE: Free T4: 1.12 ng/dL (ref 0.60–1.60)

## 2023-11-06 MED ORDER — ALBUTEROL SULFATE HFA 108 (90 BASE) MCG/ACT IN AERS: 1.0000 | INHALATION_SPRAY | RESPIRATORY_TRACT | 3 refills | Status: DC | PRN

## 2023-11-06 MED ORDER — LISINOPRIL 40 MG PO TABS: 40.0000 mg | ORAL_TABLET | Freq: Every day | ORAL | 1 refills | Status: DC

## 2023-11-06 MED ORDER — MIRABEGRON ER 50 MG PO TB24: 50.0000 mg | ORAL_TABLET | Freq: Every day | ORAL | 1 refills | Status: DC

## 2023-11-06 NOTE — Patient Instructions (Signed)
 Give [place] 2-3 business days to get the results of your labs back.   Keep the diet clean and stay active.  Aim to do some physical exertion for 150 minutes per week. This is typically divided into 5 days per week, 30 minutes per day. The activity should be enough to get your heart rate up. Anything is better than nothing if you have time constraints.  Someone will reach out to schedule your mammogram and lung scan.   Let us know if you need anything.

## 2023-11-06 NOTE — Progress Notes (Signed)
 Chief Complaint  Patient presents with   Medical Management of Chronic Issues    Patient presents today for a follow-up    Subjective: Patient is a 73 y.o. female here for f/u.  Hypothyroidism Patient presents for follow-up of hypothyroidism.  Reports compliance with medication- levothyroxine 100 mcg/d. Current symptoms include: fatigue, feeling cold and cold intolerance, and weight loss She believes her dose should be not significantly changed  Hypertension Patient presents for hypertension follow up. She does not monitor home blood pressures. She is compliant with medications- lisinopril 40 mg/d. Patient has these side effects of medication: none She is usually adhering to a healthy diet overall. Exercise: none No CP or SOB.   Hyperlipidemia Patient presents for dyslipidemia follow up. Currently being treated with Lipitor 40 mg/d and fenofibrate 145 mg daily compliance with treatment thus far has been good. She denies myalgias. Diet/exercise as above.  The patient is not known to have coexisting coronary artery disease.  She wakes up tired, unsure if she snores at night. Does not wake up gasping for air. Reports doing the minimum of what she's required to do. She usually has to take a 1 hr nap. She does not exercise routinely.   She has a hx of OAB. Does not urinate much during the day. She takes Myrbetriq 50 mg/d. Complaint, no AE's. No longer having incontinence since starting med.   Past Medical History:  Diagnosis Date   Anxiety    Benzodiazepine dependence (HCC)    Colonic polyp    mulitple polyps. repeat in 05/2019   Constipation    IBS   COPD (chronic obstructive pulmonary disease) (HCC)    Depression    GERD (gastroesophageal reflux disease)    Hyperlipidemia    Hypertension    Insomnia    Migraine headache    OAB (overactive bladder)    Osteoarthritis    Osteoporosis 12/19/2017   Post-surgical hypothyroidism    Pulmonary embolism (HCC)    Eliquis to  stop 05/03/19   Scoliosis     Objective: BP 124/84   Pulse 82   Ht 4\' 11"  (1.499 m)   Wt 114 lb 6.4 oz (51.9 kg)   SpO2 97%   BMI 23.11 kg/m  General: Awake, appears stated age Heart: RRR, no LE edema Lungs: CTAB, no rales, wheezes or rhonchi. No accessory muscle use Abdomen: Bowel sounds present, soft, nontender, nondistended Mouth: MMM Psych: Age appropriate judgment and insight, normal affect and mood  Assessment and Plan: Postoperative hypothyroidism - Plan: TSH, T4, free  Cold intolerance - Plan: CBC w/Diff  Essential hypertension  Mixed hyperlipidemia - Plan: Comprehensive metabolic panel with GFR, Lipid panel  OAB (overactive bladder)  Encounter for screening mammogram for malignant neoplasm of breast - Plan: MM DIGITAL SCREENING BILATERAL  Smoking greater than 20 pack years - Plan: CT CHEST LUNG CANCER SCREENING LOW DOSE WO CONTRAST  Screening for colon cancer - Plan: Cologuard  Chronic, unsure if stable.  For now she will continue levothyroxine 100 mcg daily.  Check labs. Labs as above, interested to see what her thyroid level is. Chronic, stable.  Continue lisinopril 40 mg daily.  Counseled on diet and exercise. Chronic, probably stable.  Continue Lipitor 40 mg daily, fenofibrate 145 mg daily. Chronic, relatively stable.  Continue Myrbetriq 50 mg daily. Due for mammogram again. Lung cancer screening ordered. Cologuard ordered again. Follow-up in 6 months for a physical/AWV. The patient voiced understanding and agreement to the plan.  Jilda Roche Preston, DO 11/06/23  12:09 PM

## 2023-11-07 ENCOUNTER — Other Ambulatory Visit: Payer: Self-pay

## 2023-11-07 ENCOUNTER — Ambulatory Visit: Payer: Self-pay

## 2023-11-07 ENCOUNTER — Ambulatory Visit

## 2023-11-07 DIAGNOSIS — R748 Abnormal levels of other serum enzymes: Secondary | ICD-10-CM

## 2023-11-07 DIAGNOSIS — R7309 Other abnormal glucose: Secondary | ICD-10-CM

## 2023-11-07 LAB — HEMOGLOBIN A1C: Hgb A1c MFr Bld: 5 % (ref 4.6–6.5)

## 2023-11-07 NOTE — Telephone Encounter (Signed)
 Patient has a few questions about lab results and would like call back to her main #:  Patient would like to answer PCP question about alcohol and tylenol. Patient states she does not drink any alcohol, but she does take Tylenol daily due to a "pins and needles" feeling she experiences in her shoulder. Patient states she is open to a new medication if PCP could advise on one.  Patient had a question about her thyroid level. Patient states "I have been cold and tired lately and my doctor increased my thyroid medication. What do I need to do for this dose now? I need a refill called in now, will he call in the same dosage?" - Please send in refill as well. Patient questioning if she should be taking anything to assist with her Triglyceride levels   Copied from CRM [id number]. Topic: Clinical - Lab/Test Results >> Nov 07, 2023  8:11 AM [place] E wrote: Reason for CRM: Patient called in regarding her lab results, relayed to patient the notes that PCP put in chart, but she has further questions about her thyroid. Reason for Disposition  [1] Caller requesting NON-URGENT health information AND [2] PCP's office is the best resource  Answer Assessment - Initial Assessment Questions 1. REASON FOR CALL or QUESTION: "What is your reason for calling today?" or "How can I best help you?" or "What question do you have that I can help answer?"     Patient has a few questions about lab results - please see notes  Protocols used: Information Only Call - No Triage-A-AH

## 2023-11-07 NOTE — Addendum Note (Signed)
 Addended by: Cheron Every C on: 11/07/2023 11:53 AM   Modules accepted: Orders

## 2023-11-07 NOTE — Telephone Encounter (Signed)
 Patient declined visit for muscle relaxer.

## 2023-11-07 NOTE — Telephone Encounter (Signed)
 Would prefer to evaluate her before rx'ing muscle relaxer. Of the 5 issues discussed, that was not one of them.

## 2023-11-07 NOTE — Telephone Encounter (Signed)
 Patient was advised and verbalized understanding. Also add on lab form faxed and CMP ordered for [facility] draw station in [place]. Patient want to know if she could have a muscle relaxer send into pharmacy for her shoulder pain.

## 2023-11-07 NOTE — Telephone Encounter (Addendum)
 Liver enzymes elevated which is why I was asking. Try to hold on any Tylenol to see if it affects s/s's, particularly before testing liver function again. Thyroid levels were normal, there will be no change.  Will see what her recheck is first.

## 2023-11-13 ENCOUNTER — Other Ambulatory Visit: Payer: Self-pay | Admitting: Family Medicine

## 2023-11-13 DIAGNOSIS — R748 Abnormal levels of other serum enzymes: Secondary | ICD-10-CM | POA: Diagnosis not present

## 2023-11-14 ENCOUNTER — Encounter: Payer: Self-pay | Admitting: Family Medicine

## 2023-11-14 ENCOUNTER — Other Ambulatory Visit: Payer: Self-pay | Admitting: Family Medicine

## 2023-11-14 DIAGNOSIS — R7989 Other specified abnormal findings of blood chemistry: Secondary | ICD-10-CM

## 2023-11-14 LAB — COMPREHENSIVE METABOLIC PANEL WITH GFR
ALT: 282 IU/L — ABNORMAL HIGH (ref 0–32)
AST: 240 IU/L — ABNORMAL HIGH (ref 0–40)
Albumin: 4 g/dL (ref 3.8–4.8)
Alkaline Phosphatase: 203 IU/L — ABNORMAL HIGH (ref 44–121)
BUN/Creatinine Ratio: 8 — ABNORMAL LOW (ref 12–28)
BUN: 7 mg/dL — ABNORMAL LOW (ref 8–27)
Bilirubin Total: 0.5 mg/dL (ref 0.0–1.2)
CO2: 21 mmol/L (ref 20–29)
Calcium: 10.1 mg/dL (ref 8.7–10.3)
Chloride: 101 mmol/L (ref 96–106)
Creatinine, Ser: 0.86 mg/dL (ref 0.57–1.00)
Globulin, Total: 2.6 g/dL (ref 1.5–4.5)
Glucose: 105 mg/dL — ABNORMAL HIGH (ref 70–99)
Potassium: 4.1 mmol/L (ref 3.5–5.2)
Sodium: 137 mmol/L (ref 134–144)
Total Protein: 6.6 g/dL (ref 6.0–8.5)
eGFR: 72 mL/min/{1.73_m2} (ref >59)

## 2023-11-20 ENCOUNTER — Encounter: Payer: Self-pay | Admitting: Physician Assistant

## 2023-11-20 ENCOUNTER — Ambulatory Visit: Admitting: Physician Assistant

## 2023-11-20 ENCOUNTER — Encounter: Payer: Self-pay | Admitting: Family Medicine

## 2023-11-20 ENCOUNTER — Ambulatory Visit (INDEPENDENT_AMBULATORY_CARE_PROVIDER_SITE_OTHER)

## 2023-11-20 DIAGNOSIS — F5101 Primary insomnia: Secondary | ICD-10-CM | POA: Diagnosis not present

## 2023-11-20 DIAGNOSIS — F411 Generalized anxiety disorder: Secondary | ICD-10-CM

## 2023-11-20 DIAGNOSIS — Z720 Tobacco use: Secondary | ICD-10-CM | POA: Diagnosis not present

## 2023-11-20 DIAGNOSIS — R7989 Other specified abnormal findings of blood chemistry: Secondary | ICD-10-CM | POA: Diagnosis not present

## 2023-11-20 MED ORDER — DIAZEPAM 5 MG PO TABS: 5.0000 mg | ORAL_TABLET | Freq: Three times a day (TID) | ORAL | 5 refills | Status: DC

## 2023-11-20 NOTE — Progress Notes (Signed)
 [facility]  Patient ID: Brenda Dunn,  MRN: [id number]  PCP: Jobe Mulder, DO  Date of Evaluation: 11/20/2023 Time spent:25 minutes  Chief Complaint:  Chief Complaint   Anxiety; Follow-up   Virtual Visit via Telehealth  I connected with patient by telephone, with their informed consent, and verified patient privacy and that I am speaking with the correct person using two identifiers.  I am private, in my office and the patient is at home.  I discussed the limitations, risks, security and privacy concerns of performing an evaluation and management service by telephone and the availability of in person appointments. I also discussed with the patient that there may be a patient responsible charge related to this service. The patient expressed understanding and agreed to proceed.   I discussed the assessment and treatment plan with the patient. The patient was provided an opportunity to ask questions and all were answered. The patient agreed with the plan and demonstrated an understanding of the instructions.   The patient was advised to call back or seek an in-person evaluation if the symptoms worsen or if the condition fails to improve as anticipated.  I provided 25 minutes of non-face-to-face time during this encounter.  HISTORY/CURRENT STATUS: HPI   for routine follow-up.  Since her last visit she has been doing well.  The Valium  is still effective for the anxiety.  Denies panic attacks but if she does not take the diazepam  she gets very overwhelmed.  She states that she is doing "fantastic."  Energy and motivation are good.  No extreme sadness, tearfulness, or feelings of hopelessness.  Sleeps well most of the time. ADLs and personal hygiene are normal.   Denies any changes in concentration, making decisions, or remembering things.  Appetite has not changed.  Weight is stable.  Denies suicidal or homicidal thoughts.  Patient denies increased energy with  decreased need for sleep, increased talkativeness, racing thoughts, impulsivity or risky behaviors, increased spending, increased libido, grandiosity, increased irritability or anger, paranoia, or hallucinations.  Denies dizziness, syncope, seizures, numbness, tingling, tremor, tics, unsteady gait, slurred speech, confusion. Denies muscle or joint pain, stiffness, or dystonia.  Individual Medical History/ Review of Systems: Changes? :No   Past Psychiatric Medication Trials: Remeron - Had worsening depression and insomnia with 30 mg dose. Olanzapine Seroquel  Risperdal -helpful for mood, anxiety, and insomnia Diazepam  Gabapentin  Trazodone  Trileptal - hyponatremia Ambien   Allergies: Patient has no known allergies.  Current Medications:  Current Outpatient Medications:    acetaminophen  (TYLENOL ) 500 MG tablet, Take 1,000 mg by mouth every 6 (six) hours as needed for moderate pain., Disp: , Rfl:    albuterol  (VENTOLIN  HFA) 108 (90 Base) MCG/ACT inhaler, Inhale 1-2 puffs into the lungs every 4 (four) hours as needed for wheezing or shortness of breath., Disp: 36 each, Rfl: 3   atorvastatin  (LIPITOR) 40 MG tablet, TAKE 1 TABLET BY MOUTH EVERYDAY AT BEDTIME, Disp: 90 tablet, Rfl: 1   budesonide -formoterol  (SYMBICORT ) 160-4.5 MCG/ACT inhaler, Inhale 2 puffs into the lungs 2 (two) times daily., Disp: , Rfl:    docusate sodium  (COLACE) 100 MG capsule, Take 100-200 mg by mouth at bedtime. Patient taking 4-6 capsules daily, Disp: , Rfl:    fenofibrate  (TRICOR ) 145 MG tablet, Take 1 tablet (145 mg total) by mouth daily., Disp: 90 tablet, Rfl: 3   hydrocortisone  (ANUSOL -HC) 2.5 % rectal cream, Place 1 Application rectally 2 (two) times daily., Disp: 30 g, Rfl: 1   lactulose  (CHRONULAC ) 10 GM/15ML solution, Take 15 mLs (  10 g total) by mouth 2 (two) times daily., Disp: 946 mL, Rfl: 1   levothyroxine  (SYNTHROID ) 100 MCG tablet, TAKE 1 TABLET BY MOUTH EVERY DAY, Disp: 90 tablet, Rfl: 1   linaclotide   (LINZESS ) 290 MCG CAPS capsule, Take 1 capsule (290 mcg total) by mouth daily before breakfast., Disp: 30 capsule, Rfl: 2   lisinopril  (ZESTRIL ) 40 MG tablet, Take 1 tablet (40 mg total) by mouth daily., Disp: 90 tablet, Rfl: 1   mirabegron  ER (MYRBETRIQ ) 50 MG TB24 tablet, Take 1 tablet (50 mg total) by mouth daily., Disp: 90 tablet, Rfl: 1   prazosin  (MINIPRESS ) 5 MG capsule, TAKE 1 CAPSULE BY MOUTH DAILY AT BEDTIME WITH 1 MG PRAZOSIN  (7 MG TOTAL DAILY DOSE), Disp: 90 capsule, Rfl: 1   diazepam  (VALIUM ) 5 MG tablet, Take 1 tablet (5 mg total) by mouth in the morning, at noon, and at bedtime. May take another 5 mg (=10 mg) at bedtime occas for sleep., Disp: 105 tablet, Rfl: 5 Medication Side Effects: none  Family Medical/ Social History: Changes? No  MENTAL HEALTH EXAM:  There were no vitals taken for this visit.There is no height or weight on file to calculate BMI.  General Appearance:  unable to assess  Eye Contact:   unable to assess  Speech:  Clear and Coherent and Normal Rate  Volume:  Normal  Mood:  Euthymic  Affect:   unable to assess  Thought Process:  Goal Directed and Descriptions of Associations: Circumstantial  Orientation:  Full (Time, Place, and Person)  Thought Content: Logical   Suicidal Thoughts:  No  Homicidal Thoughts:  No  Memory:  WNL  Judgement:  Good  Insight:  Good  Psychomotor Activity:   unable to assess  Concentration:  Concentration: Good  Recall:  Good  Fund of Knowledge: Good  Language: Good  Assets:  Desire for Improvement Financial Resources/Insurance Housing Transportation  ADL's:  Intact  Cognition: WNL  Prognosis:  Good   DIAGNOSES:    ICD-10-CM   1. Generalized anxiety disorder  F41.1 diazepam  (VALIUM ) 5 MG tablet    2. Primary insomnia  F51.01 diazepam  (VALIUM ) 5 MG tablet    3. Tobacco abuse  Z72.0      Receiving Psychotherapy: No   RECOMMENDATIONS:   PDMP reviewed.  Valium  filled 10/30/2023. I provided 25 minutes of  non-face-to-face time during this encounter, including time spent before and after the visit in records review, medical decision making, counseling pertinent to today's visit, and charting.   Brenda Dunn is doing well with the current treatment so no changes need to be made.  Smoking cessation was briefly discussed.  She is not ready to quit.  Continue Valium  5 mg, 1 p.o. every morning, 1 at noon, and 1 at bedtime routinely.  Occasionally she can take an additional 5 mg at bedtime to help with sleep but not every night.  She verbalizes understanding. Return in 6 months.  Brenda Slocumb, PA-C

## 2023-11-27 ENCOUNTER — Other Ambulatory Visit: Payer: Self-pay | Admitting: Family Medicine

## 2023-11-27 DIAGNOSIS — K5909 Other constipation: Secondary | ICD-10-CM

## 2023-11-27 LAB — COLOGUARD

## 2023-12-07 ENCOUNTER — Encounter (HOSPITAL_COMMUNITY): Payer: Self-pay

## 2023-12-09 LAB — COLOGUARD

## 2023-12-14 ENCOUNTER — Ambulatory Visit

## 2023-12-19 LAB — COLOGUARD

## 2023-12-21 ENCOUNTER — Ambulatory Visit (INDEPENDENT_AMBULATORY_CARE_PROVIDER_SITE_OTHER)

## 2023-12-21 ENCOUNTER — Ambulatory Visit

## 2023-12-21 DIAGNOSIS — Z122 Encounter for screening for malignant neoplasm of respiratory organs: Secondary | ICD-10-CM | POA: Diagnosis not present

## 2023-12-21 DIAGNOSIS — F17210 Nicotine dependence, cigarettes, uncomplicated: Secondary | ICD-10-CM | POA: Diagnosis not present

## 2023-12-21 DIAGNOSIS — Z1231 Encounter for screening mammogram for malignant neoplasm of breast: Secondary | ICD-10-CM | POA: Diagnosis not present

## 2023-12-26 ENCOUNTER — Telehealth: Payer: Self-pay

## 2023-12-26 NOTE — Telephone Encounter (Signed)
 Copied from CRM [id number]. Topic: General - Other >> Dec 26, 2023  8:58 AM Brenda Dunn wrote: Reason for CRM: patient has done 4 cologuard tests and they all come back stating stating she does not have enough on the cologuard and she stated she is not taking another one

## 2023-12-27 ENCOUNTER — Other Ambulatory Visit: Payer: Self-pay | Admitting: Family Medicine

## 2023-12-27 DIAGNOSIS — K5909 Other constipation: Secondary | ICD-10-CM

## 2023-12-28 ENCOUNTER — Telehealth: Payer: Self-pay

## 2023-12-28 ENCOUNTER — Other Ambulatory Visit: Payer: Self-pay | Admitting: Family Medicine

## 2023-12-28 DIAGNOSIS — H353132 Nonexudative age-related macular degeneration, bilateral, intermediate dry stage: Secondary | ICD-10-CM | POA: Diagnosis not present

## 2023-12-28 DIAGNOSIS — K5909 Other constipation: Secondary | ICD-10-CM

## 2023-12-28 DIAGNOSIS — H25012 Cortical age-related cataract, left eye: Secondary | ICD-10-CM | POA: Diagnosis not present

## 2023-12-28 DIAGNOSIS — Z961 Presence of intraocular lens: Secondary | ICD-10-CM | POA: Diagnosis not present

## 2023-12-28 DIAGNOSIS — H2512 Age-related nuclear cataract, left eye: Secondary | ICD-10-CM | POA: Diagnosis not present

## 2023-12-28 NOTE — Telephone Encounter (Signed)
 Called pt was advised of change and she stated  Understand.

## 2023-12-28 NOTE — Telephone Encounter (Signed)
 Copied from CRM [id number]. Topic: Clinical - Medication Question >> Dec 28, 2023 11:17 AM Deaijah H wrote: Reason for CRM: Patient would like to know If Dr. Gwenette Lennox would start prazosin  (MINIPRESS ) 5 MG capsule prescription back up again and if so may she get a refill. Please call [phone] to confirm.

## 2023-12-29 ENCOUNTER — Other Ambulatory Visit: Payer: Self-pay

## 2023-12-29 DIAGNOSIS — Z634 Disappearance and death of family member: Secondary | ICD-10-CM

## 2023-12-29 MED ORDER — PRAZOSIN HCL 5 MG PO CAPS: 5.0000 mg | ORAL_CAPSULE | Freq: Every evening | ORAL | 1 refills | Status: DC

## 2023-12-29 MED ORDER — PRAZOSIN HCL 5 MG PO CAPS: 5.0000 mg | ORAL_CAPSULE | Freq: Every evening | ORAL | 0 refills | Status: DC

## 2023-12-29 NOTE — Telephone Encounter (Signed)
 Refill sent and appt scheduled.

## 2024-01-02 ENCOUNTER — Ambulatory Visit (INDEPENDENT_AMBULATORY_CARE_PROVIDER_SITE_OTHER): Admitting: Family Medicine

## 2024-01-02 ENCOUNTER — Encounter: Payer: Self-pay | Admitting: Family Medicine

## 2024-01-02 VITALS — BP 122/76 | HR 78 | Temp 97.8°F | Resp 16 | Ht 59.0 in | Wt 118.6 lb

## 2024-01-02 DIAGNOSIS — F5101 Primary insomnia: Secondary | ICD-10-CM

## 2024-01-02 DIAGNOSIS — R2689 Other abnormalities of gait and mobility: Secondary | ICD-10-CM | POA: Diagnosis not present

## 2024-01-02 DIAGNOSIS — M7632 Iliotibial band syndrome, left leg: Secondary | ICD-10-CM | POA: Diagnosis not present

## 2024-01-02 DIAGNOSIS — M549 Dorsalgia, unspecified: Secondary | ICD-10-CM

## 2024-01-02 MED ORDER — METHYLPREDNISOLONE ACETATE 80 MG/ML IJ SUSP
80.0000 mg | Freq: Once | INTRAMUSCULAR | Status: AC
  Administered 2024-01-02: 80 mg via INTRAMUSCULAR

## 2024-01-02 NOTE — Progress Notes (Signed)
 Chief Complaint  Patient presents with   Medication Refill    Medication Check     Subjective: Patient is a 73 y.o. female here for f/u. Here w sister.   Insomnia- Hx of this on Prazosin  5 mg/d. Has not been taking it but would like to go back on it. She tolerated it well in the past.   Hx of low back pain- recent flare. Comes nad goes. No recent inj or change in activity. No neuro s/s's. Does well with IM steroid injections.  Past Medical History:  Diagnosis Date   Anxiety    Benzodiazepine dependence (HCC)    Colonic polyp    mulitple polyps. repeat in 05/2019   Constipation    IBS   COPD (chronic obstructive pulmonary disease) (HCC)    Depression    GERD (gastroesophageal reflux disease)    Hyperlipidemia    Hypertension    Insomnia    Migraine headache    OAB (overactive bladder)    Osteoarthritis    Osteoporosis 12/19/2017   Post-surgical hypothyroidism    Pulmonary embolism (HCC)    Eliquis  to stop 05/03/19   Scoliosis     Objective: BP 122/76 (BP Location: Left Arm, Patient Position: Sitting)   Pulse 78   Temp 97.8 F (36.6 C) (Oral)   Resp 16   Ht 4\' 11"  (1.499 m)   Wt 118 lb 9.6 oz (53.8 kg)   SpO2 98%   BMI 23.95 kg/m  General: Awake, appears stated age Lungs: No accessory muscle use MSK: Mild ttp over the L lumbar parasp msc; neg log roll, [place], FABER, FADIR Psych: Age appropriate judgment and insight, normal affect and mood  Assessment and Plan: Primary insomnia  Back pain, unspecified back location, unspecified back pain laterality, unspecified chronicity  Iliotibial band syndrome of left side  Balance problem - Plan: Ambulatory referral to Physical Therapy  Chronic, uncontrolled. Restart Prazosin  5 mg/d.  Chronic, exacerbation. Depomedrol 80 mg IM today. Heat, ice, Tylenol , stretches.  Stretches/exercises.  Refer PT.  F/u as originally scheduled.  The patient voiced understanding and agreement to the plan.  Shellie Dials  [place], DO 01/02/24  10:43 AM

## 2024-01-02 NOTE — Addendum Note (Signed)
 Addended by: Treshon Stannard M on: 01/02/2024 11:05 AM   Modules accepted: Orders

## 2024-01-02 NOTE — Patient Instructions (Addendum)
 Heat (pad or rice pillow in microwave) over affected area, 10-15 minutes twice daily.   Ice/cold pack over area for 10-15 min twice daily.  OK to take Tylenol  1000 mg (2 extra strength tabs) or 975 mg (3 regular strength tabs) every 6 hours as needed.  If you do not hear anything about your referral in the next 1-2 weeks, call our office and ask for an update.  Let us  know if you need anything.  Iliotibial Band Syndrome Rehab It is normal to feel mild stretching, pulling, tightness, or discomfort as you do these exercises, but you should stop right away if you feel sudden pain or your pain gets worse.  Stretching and range of motion exercises These exercises warm up your muscles and joints and improve the movement and flexibility of your hip and pelvis. Exercise A: Quadriceps, prone    Lie on your abdomen on a firm surface, such as a bed or padded floor. Bend your left / right knee and hold your ankle. If you cannot reach your ankle or pant leg, loop a belt around your foot and grab the belt instead. Gently pull your heel toward your buttocks. Your knee should not slide out to the side. You should feel a stretch in the front of your thigh and knee. Hold this position for 30 seconds. Repeat 2 times. Complete this stretch 3 times per week. Exercise B: Iliotibial band    Lie on your side with your left / right leg in the top position. Bend both of your knees and grab your left / right ankle. Stretch out your bottom arm to help you balance. Slowly bring your top knee back so your thigh goes behind your trunk. Slowly lower your top leg toward the floor until you feel a gentle stretch on the outside of your left / right hip and thigh. If you do not feel a stretch and your knee will not fall farther, place the heel of your other foot on top of your knee and pull your knee down toward the floor with your foot. Hold this position for 30 seconds. Repeat 2 times. Complete this stretch 3 times per  week. Strengthening exercises These exercises build strength and endurance in your hip and pelvis. Endurance is the ability to use your muscles for a long time, even after they get tired. Exercise C: Straight leg raises (hip abductors)     Lie on your side with your left / right leg in the top position. Lie so your head, shoulder, knee, and hip line up. You may bend your bottom knee to help you balance. Roll your hips slightly forward so your hips are stacked directly over each other and your left / right knee is facing forward. Tense the muscles in your outer thigh and lift your top leg 4-6 inches (10-15 cm). Hold this position for 3 seconds. Repeat for a total of 10 reps. Slowly return to the starting position. Let your muscles relax completely before doing another repetition. Repeat 2 times. Complete this exercise 3 times per week. Exercise D: Straight leg raises (hip extensors) Lie on your abdomen on your bed or a firm surface. You can put a pillow under your hips if that is more comfortable. Bend your left / right knee so your foot is straight up in the air. Squeeze your buttock muscles and lift your left / right thigh off the bed. Do not let your back arch. Tense this muscle as hard as you can without  increasing any knee pain. Hold this position for 2 seconds. Repeat for a total of 10 reps Slowly lower your leg to the starting position and allow it to relax completely. Repeat 2 times. Complete this exercise 3 times per week. Exercise E: Hip hike Stand sideways on a bottom step. Stand on your left / right leg with your other foot unsupported next to the step. You can hold onto the railing or wall if needed for balance. Keep your knees straight and your torso square. Then, lift your left / right hip up toward the ceiling. Slowly let your left / right hip lower toward the floor, past the starting position. Your foot should get closer to the floor. Do not lean or bend your knees. Repeat 2  times. Complete this exercise 3 times per week.  Document Released: 07/18/2005 Document Revised: 03/22/2016 Document Reviewed: 06/19/2015 Elsevier Interactive Patient Education  [employer].

## 2024-01-05 ENCOUNTER — Encounter: Payer: Self-pay | Admitting: Podiatry

## 2024-01-05 ENCOUNTER — Ambulatory Visit: Admitting: Podiatry

## 2024-01-05 DIAGNOSIS — B351 Tinea unguium: Secondary | ICD-10-CM | POA: Diagnosis not present

## 2024-01-05 DIAGNOSIS — M79674 Pain in right toe(s): Secondary | ICD-10-CM

## 2024-01-05 DIAGNOSIS — M79675 Pain in left toe(s): Secondary | ICD-10-CM | POA: Diagnosis not present

## 2024-01-05 DIAGNOSIS — D492 Neoplasm of unspecified behavior of bone, soft tissue, and skin: Secondary | ICD-10-CM

## 2024-01-05 NOTE — Progress Notes (Signed)
  Subjective:  Patient ID: Brenda Dunn, female    DOB: 05-26-1951,   MRN: [id number]  No chief complaint on file.   73 y.o. female presents for concern of thickened elongated and painful nails that are difficult to trim. Requesting to have them trimmed today.  She has regularly followed with Dr. Clydia Dart in the past. Relates she has a growth on her right second toe. She was scheduled to have surgical removal but her husband past and was not sure if she could go through recover alone.   PCP:  Jobe Mulder, DO    . Denies any other pedal complaints. Denies n/v/f/c.   Past Medical History:  Diagnosis Date   Anxiety    Benzodiazepine dependence (HCC)    Colonic polyp    mulitple polyps. repeat in 05/2019   Constipation    IBS   COPD (chronic obstructive pulmonary disease) (HCC)    Depression    GERD (gastroesophageal reflux disease)    Hyperlipidemia    Hypertension    Insomnia    Migraine headache    OAB (overactive bladder)    Osteoarthritis    Osteoporosis 12/19/2017   Post-surgical hypothyroidism    Pulmonary embolism (HCC)    Eliquis  to stop 05/03/19   Scoliosis     Objective:  Physical Exam: Vascular: DP/PT pulses 2/4 bilateral. CFT <3 seconds. Normal hair growth on digits. No edema.  Skin. No lacerations or abrasions bilateral feet. Nails 1-5 bilateral are thickened dystrophic and elongated. Growth noted to lateral second digit on right. Firms in nature with about 2 cm protruberance from lateral nail fold. Hyeprkeratotic lesion noted to medial first metatarsal on right.  Musculoskeletal: MMT 5/5 bilateral lower extremities in DF, PF, Inversion and Eversion. Deceased ROM in DF of ankle joint.  Neurological: Sensation intact to light touch.   Assessment:   1. Dermatophytosis of nail   2. Pain in toes of both feet      Plan:  Patient was evaluated and treated and all questions answered. -Will continue to monitor growth and as long as no changes will defer  surgical intervention for now.  -Mechanically debrided all nails 1-5 bilateral using sterile nail nipper and filed with dremel without incident  -Answered all patient questions -Patient to return  in 3 months for at risk foot care -Patient advised to call the office if any problems or questions arise in the meantime.   Jennefer Moats, DPM

## 2024-01-08 ENCOUNTER — Telehealth: Payer: Self-pay

## 2024-01-08 ENCOUNTER — Other Ambulatory Visit: Payer: Self-pay | Admitting: Family Medicine

## 2024-01-08 ENCOUNTER — Other Ambulatory Visit

## 2024-01-08 MED ORDER — TIZANIDINE HCL 4 MG PO TABS: 4.0000 mg | ORAL_TABLET | Freq: Four times a day (QID) | ORAL | 0 refills | Status: DC | PRN

## 2024-01-08 NOTE — Telephone Encounter (Signed)
 Copied from CRM [id number]. Topic: Clinical - Medication Question >> Jan 08, 2024  8:10 AM Everlene Hobby D wrote: Says her pcp knows about her left leg she was just in to see him. Says it hurts if she walks, raises her right leg it hurts and wants to know if her pcp can call in a muscle relaxer. Her pcp told her to some exercises she says she tried to do them but it hurt really bad.

## 2024-01-09 ENCOUNTER — Other Ambulatory Visit: Payer: Self-pay

## 2024-01-09 ENCOUNTER — Other Ambulatory Visit (INDEPENDENT_AMBULATORY_CARE_PROVIDER_SITE_OTHER)

## 2024-01-09 DIAGNOSIS — Z1211 Encounter for screening for malignant neoplasm of colon: Secondary | ICD-10-CM

## 2024-01-10 ENCOUNTER — Telehealth: Payer: Self-pay | Admitting: Family Medicine

## 2024-01-10 ENCOUNTER — Ambulatory Visit: Payer: Self-pay | Admitting: Family Medicine

## 2024-01-10 ENCOUNTER — Encounter: Payer: Self-pay | Admitting: Family Medicine

## 2024-01-10 ENCOUNTER — Ambulatory Visit: Admitting: Family Medicine

## 2024-01-10 VITALS — BP 116/72 | HR 85 | Temp 98.0°F | Resp 16 | Ht 59.0 in | Wt 119.0 lb

## 2024-01-10 DIAGNOSIS — M7062 Trochanteric bursitis, left hip: Secondary | ICD-10-CM

## 2024-01-10 LAB — FECAL OCCULT BLOOD, IMMUNOCHEMICAL: Fecal Occult Bld: NEGATIVE

## 2024-01-10 NOTE — Progress Notes (Addendum)
 Musculoskeletal Exam  Patient: Brenda Dunn DOB: 1951-06-18  DOS: 01/10/2024  SUBJECTIVE:  Chief Complaint:   Chief Complaint  Patient presents with   Leg Pain    Leg Pain    Brenda Dunn is a 73 y.o.  female for evaluation and treatment of R thigh pain.   Onset:  4 days ago. No inj or change in activity.  Location: lateral thighs Character:  aching  Progression of issue:  is unchanged Associated symptoms: radiates down her leg No bruising, redness, swelling.  Treatment: to date has been Tylenol , rest.   Neurovascular symptoms: no  Past Medical History:  Diagnosis Date   Anxiety    Benzodiazepine dependence (HCC)    Colonic polyp    mulitple polyps. repeat in 05/2019   Constipation    IBS   COPD (chronic obstructive pulmonary disease) (HCC)    Depression    GERD (gastroesophageal reflux disease)    Hyperlipidemia    Hypertension    Insomnia    Migraine headache    OAB (overactive bladder)    Osteoarthritis    Osteoporosis 12/19/2017   Post-surgical hypothyroidism    Pulmonary embolism (HCC)    Eliquis  to stop 05/03/19   Scoliosis     Objective: VITAL SIGNS: BP 116/72 (BP Location: Left Arm, Patient Position: Sitting)   Pulse 85   Temp 98 F (36.7 C) (Oral)   Resp 16   Ht 4' 11 (1.499 m)   Wt 119 lb (54 kg)   SpO2 97%   BMI 24.04 kg/m  Constitutional: Well formed, well developed. No acute distress. Thorax & Lungs: No accessory muscle use Musculoskeletal: L thigh/hip.   Normal active range of motion: yes.   Normal passive range of motion: yes Tenderness to palpation: Yes over the greater trochanter Deformity: no Ecchymosis: no There is no pain with resisted hip abduction Tests positive: None Tests negative: Stinchfield, logroll, FABER/FADIR Neurologic: Normal sensory function. Antalgic gait.  Psychiatric: Normal mood. Age appropriate judgment and insight. Alert & oriented x 3.    Procedure note: Greater trochanteric bursa injection Verbal consent  obtained. The area of interest was palpated and demarcated with an otoscope speculum. It was cleaned with an alcohol swab. Freeze spray was used. A 27 g needle was inserted at a perpendicular angle through the area of interested. The plunger was withdrawn to ensure our placement was not in a vessel. 2 mL of 1% lidocaine  without epi and 40 mg of Depo-Medrol  was injected. A bandaid was placed. The patient tolerated the procedure well.  There were no complications noted.   Assessment:  Greater trochanteric bursitis of left hip - Plan: PR ARTHROCENTESIS ASPIR&/INJ MAJOR JT/BURSA W/O US   Plan: Stretches/exercises for the IT band, heat, ice, Tylenol .  Can consider referral if no improvement. F/u as originally scheduled. The patient voiced understanding and agreement to the plan.   Mabel [place] Lewellen, DO 01/24/24  8:39 AM

## 2024-01-10 NOTE — Patient Instructions (Addendum)
 Heat (pad or rice pillow in microwave) over affected area, 10-15 minutes twice daily.   Ice/cold pack over area for 10-15 min twice daily.  OK to take Tylenol  1000 mg (2 extra strength tabs) or 975 mg (3 regular strength tabs) every 6 hours as needed.  Let us  know if you need anything.  Iliotibial Band Syndrome Rehab It is normal to feel mild stretching, pulling, tightness, or discomfort as you do these exercises, but you should stop right away if you feel sudden pain or your pain gets worse.  Stretching and range of motion exercises These exercises warm up your muscles and joints and improve the movement and flexibility of your hip and pelvis. Exercise A: Quadriceps, prone    Lie on your abdomen on a firm surface, such as a bed or padded floor. Bend your left / right knee and hold your ankle. If you cannot reach your ankle or pant leg, loop a belt around your foot and grab the belt instead. Gently pull your heel toward your buttocks. Your knee should not slide out to the side. You should feel a stretch in the front of your thigh and knee. Hold this position for 30 seconds. Repeat 2 times. Complete this stretch 3 times per week. Exercise B: Iliotibial band    Lie on your side with your left / right leg in the top position. Bend both of your knees and grab your left / right ankle. Stretch out your bottom arm to help you balance. Slowly bring your top knee back so your thigh goes behind your trunk. Slowly lower your top leg toward the floor until you feel a gentle stretch on the outside of your left / right hip and thigh. If you do not feel a stretch and your knee will not fall farther, place the heel of your other foot on top of your knee and pull your knee down toward the floor with your foot. Hold this position for 30 seconds. Repeat 2 times. Complete this stretch 3 times per week. Strengthening exercises These exercises build strength and endurance in your hip and pelvis. Endurance is  the ability to use your muscles for a long time, even after they get tired. Exercise C: Straight leg raises (hip abductors)     Lie on your side with your left / right leg in the top position. Lie so your head, shoulder, knee, and hip line up. You may bend your bottom knee to help you balance. Roll your hips slightly forward so your hips are stacked directly over each other and your left / right knee is facing forward. Tense the muscles in your outer thigh and lift your top leg 4-6 inches (10-15 cm). Hold this position for 3 seconds. Repeat for a total of 10 reps. Slowly return to the starting position. Let your muscles relax completely before doing another repetition. Repeat 2 times. Complete this exercise 3 times per week. Exercise D: Straight leg raises (hip extensors) Lie on your abdomen on your bed or a firm surface. You can put a pillow under your hips if that is more comfortable. Bend your left / right knee so your foot is straight up in the air. Squeeze your buttock muscles and lift your left / right thigh off the bed. Do not let your back arch. Tense this muscle as hard as you can without increasing any knee pain. Hold this position for 2 seconds. Repeat for a total of 10 reps Slowly lower your leg to the  starting position and allow it to relax completely. Repeat 2 times. Complete this exercise 3 times per week. Exercise E: Hip hike Stand sideways on a bottom step. Stand on your left / right leg with your other foot unsupported next to the step. You can hold onto the railing or wall if needed for balance. Keep your knees straight and your torso square. Then, lift your left / right hip up toward the ceiling. Slowly let your left / right hip lower toward the floor, past the starting position. Your foot should get closer to the floor. Do not lean or bend your knees. Repeat 2 times. Complete this exercise 3 times per week.  Document Released: 07/18/2005 Document Revised: 03/22/2016  Document Reviewed: 06/19/2015 Elsevier Interactive Patient Education  [employer].

## 2024-01-10 NOTE — Telephone Encounter (Signed)
 Called pt back regarding atorvastatin  and she forgot that she is taking it. Told pt to  Continue taking per Dr.[name].

## 2024-01-10 NOTE — Telephone Encounter (Signed)
 Pt insisted that she does not take the atorvastatin  but I showed her on her med list she is going to check at home

## 2024-01-12 ENCOUNTER — Other Ambulatory Visit: Payer: Self-pay

## 2024-01-12 ENCOUNTER — Telehealth: Payer: Self-pay

## 2024-01-12 ENCOUNTER — Telehealth: Payer: Self-pay | Admitting: Family Medicine

## 2024-01-12 DIAGNOSIS — M7062 Trochanteric bursitis, left hip: Secondary | ICD-10-CM

## 2024-01-12 MED ORDER — METHOCARBAMOL 500 MG PO TABS: 500.0000 mg | ORAL_TABLET | Freq: Three times a day (TID) | ORAL | 0 refills | Status: DC | PRN

## 2024-01-12 NOTE — Telephone Encounter (Signed)
 Copied from CRM [id number]. Topic: General - Other >> Jan 12, 2024 12:29 PM Brenda Dunn wrote: Reason for CRM: Patient is calling because is still having trouble lifting her legs in the shower and any other tasks she has to perform.  She wants to know when will the shot take effect   She is also wanting to know if a stronger muscle relaxer can be prescribed because she is ready to walk normal again

## 2024-01-12 NOTE — Telephone Encounter (Signed)
 Copied from CRM [id number]. Topic: General - Inquiry >> Jan 12, 2024  1:22 PM [place] wrote: Patient does not want her son Brenda Dunn to have any access to her account. He was removed from her emergency contact. Do not let him have any access to her records.

## 2024-01-12 NOTE — Telephone Encounter (Signed)
 Called pt was advised of medication and she stated no improvement. Pt stated can't afford copay but referral has been placed.

## 2024-01-12 NOTE — Telephone Encounter (Signed)
 Did she improve at all after the shot? If she never felt better, please refer to sports med. If she did get better, can take up to 5-6 d. Different msc relaxer sent.

## 2024-01-16 ENCOUNTER — Telehealth: Payer: Self-pay | Admitting: Family Medicine

## 2024-01-16 NOTE — Telephone Encounter (Signed)
 Copied from CRM [id number]. Topic: Clinical - Medication Refill >> Jan 16, 2024  8:06 AM Freya Jesus wrote: Medication: methocarbamol (ROBAXIN) 500 MG tablet [[id number]]  Has the patient contacted their pharmacy? No (Agent: If no, request that the patient contact the pharmacy for the refill. If patient does not wish to contact the pharmacy document the reason why and proceed with request.) (Agent: If yes, when and what did the pharmacy advise?)  This is the patient's preferred pharmacy:  [id number] [place] [place] - [address] [place]  [place] [address] Phone: [phone] Fax: [phone]  Is this the correct pharmacy for this prescription? Yes If no, delete pharmacy and type the correct one.   Has the prescription been filled recently? Yes  Is the patient out of the medication? Yes  Has the patient been seen for an appointment in the last year OR does the patient have an upcoming appointment? Yes  Can we respond through MyChart? No  Agent: Please be advised that Rx refills may take up to 3 business days. We ask that you follow-up with your pharmacy.

## 2024-01-16 NOTE — Telephone Encounter (Signed)
 Patient called back and asked to speak with the nurse regarding this med for her future refills.

## 2024-01-17 ENCOUNTER — Telehealth: Payer: Self-pay

## 2024-01-17 MED ORDER — METHOCARBAMOL 500 MG PO TABS: 500.0000 mg | ORAL_TABLET | Freq: Three times a day (TID) | ORAL | 0 refills | Status: DC | PRN

## 2024-01-17 NOTE — Telephone Encounter (Signed)
 Robaxin refilled this morning.

## 2024-01-17 NOTE — Telephone Encounter (Signed)
 Copied from CRM [id number]. Topic: Clinical - Prescription Issue >> Jan 17, 2024  8:04 AM Kita Perish H wrote: Reason for CRM: Patient calling to check status of refill for her methocarbamol (ROBAXIN) 500 MG tablet, request pending as of 6/17 and request was refused Request refused: Request already responded to by other means (e.g. phone or fax). Please reach out to patient for some clarity patient is out of medication and states she needs them as soon as possible.  [place] [phone]

## 2024-01-17 NOTE — Telephone Encounter (Signed)
 Called pt was advised refill sent.

## 2024-01-17 NOTE — Telephone Encounter (Signed)
 done

## 2024-01-17 NOTE — Telephone Encounter (Addendum)
 duplicate     FYI Only or Action Required?: Action required by provider  Patient was last seen in primary care on 01/10/2024 by Jobe Mulder, DO. Called Nurse Triage reporting Leg Pain. Symptoms began several months ago. Interventions attempted: OTC medications: tylenol  and Prescription medications: robaxin. Symptoms are: gradually worsening.without robaxin  Triage Disposition: See PCP When Office is Open (Within 3 Days)  Patient/caregiver understands and will follow disposition?: Unsure  Reason for Disposition . [1] MODERATE pain (e.g., interferes with normal activities, limping) AND [2] present > 3 days  Answer Assessment - Initial Assessment Questions 1. ONSET: When did the pain start?      A long time ago 2. LOCATION: Where is the pain located?      Outside of left leg 3. PAIN: How bad is the pain?    (Scale 1-10; or mild, moderate, severe)   -  MILD (1-3): doesn't interfere with normal activities    -  MODERATE (4-7): interferes with normal activities (e.g., work or school) or awakens from sleep, limping    -  SEVERE (8-10): excruciating pain, unable to do any normal activities, unable to walk     Able to perform normal activities, but with pain.  Rates pain 9/10 4. WORK OR EXERCISE: Has there been any recent work or exercise that involved this part of the body?      denies 5. CAUSE: What do you think is causing the leg pain?     Patient has been told both her IT band and bursitis 6. OTHER SYMPTOMS: Do you have any other symptoms? (e.g., chest pain, back pain, breathing difficulty, swelling, rash, fever, numbness, weakness)     Denies  [facility]  [facility] tomorrow, [facility]/19, due to insurance and that she would like another medication to use until she can pick up the robaxin Also clarified PT appointment scheduled for next week.  She did not understand what this appointment was for.  Protocols used: Leg  Pain-A-AH  This encounter was created in error - please disregard.

## 2024-01-17 NOTE — Telephone Encounter (Signed)
 Done

## 2024-01-17 NOTE — Telephone Encounter (Signed)
 Copied from CRM [id number]. Topic: Clinical - Prescription Issue >> Jan 17, 2024  9:34 AM Freya Jesus wrote: Reason for CRM: Patient stated that the pharmacy will not fill her prescription of methocarbamol (ROBAXIN) 500 MG tablet [[id number]] until 6/19 and she is requesting her provider send another medication for her while she waits.

## 2024-01-18 ENCOUNTER — Other Ambulatory Visit: Payer: Self-pay | Admitting: Family Medicine

## 2024-01-18 ENCOUNTER — Telehealth: Payer: Self-pay

## 2024-01-18 MED ORDER — MIRABEGRON ER 50 MG PO TB24: 50.0000 mg | ORAL_TABLET | Freq: Every day | ORAL | 1 refills | Status: DC

## 2024-01-18 NOTE — Telephone Encounter (Signed)
 Copied from CRM [id number]. Topic: General - Other >> Jan 18, 2024  8:16 AM Annelle Kiel wrote: Reason for CRM: patient is wanting to know If there are forms for a living well and health care power of attorney at the office she would like a call back regarding this

## 2024-01-18 NOTE — Telephone Encounter (Signed)
 Copied from CRM [id number]. Topic: General - Other >> Jan 18, 2024  8:05 AM Brenda Dunn wrote: Reason for CRM: Patient called in stated she knows Dr.[name] isnt in today but wanted to know if any other provider could put in an order for a mri on her left leg on [place] on [address], would like for someone to give her a callback regarding this

## 2024-01-18 NOTE — Telephone Encounter (Signed)
 Copied from CRM [id number]. Topic: Clinical - Medication Refill >> Jan 18, 2024  8:18 AM Brenda Dunn T wrote: Medication: mirabegron  ER (MYRBETRIQ ) 50 MG TB24 tablet [[id number]]  Has the patient contacted their pharmacy? Yes (Agent: If no, request that the patient contact the pharmacy for the refill. If patient does not wish to contact the pharmacy document the reason why and proceed with request.) (Agent: If yes, when and what did the pharmacy advise?)  This is the patient's preferred pharmacy:  [id number] [place], [place] - [address] [place] [address] Phone: [phone] Fax: [phone]  Is this the correct pharmacy for this prescription? Yes If no, delete pharmacy and type the correct one.   Has the prescription been filled recently? No  Is the patient out of the medication? Yes  Has the patient been seen for an appointment in the last year OR does the patient have an upcoming appointment? Yes  Can we respond through MyChart? No  Agent: Please be advised that Rx refills may take up to 3 business days. We ask that you follow-up with your pharmacy.

## 2024-01-19 ENCOUNTER — Encounter: Payer: Self-pay | Admitting: Family Medicine

## 2024-01-19 ENCOUNTER — Ambulatory Visit: Admitting: Family Medicine

## 2024-01-19 VITALS — BP 126/72 | HR 81 | Temp 98.0°F | Resp 16 | Ht 59.0 in | Wt 122.0 lb

## 2024-01-19 DIAGNOSIS — M25552 Pain in left hip: Secondary | ICD-10-CM

## 2024-01-19 DIAGNOSIS — M7632 Iliotibial band syndrome, left leg: Secondary | ICD-10-CM

## 2024-01-19 DIAGNOSIS — N3281 Overactive bladder: Secondary | ICD-10-CM

## 2024-01-19 MED ORDER — TRAMADOL HCL 50 MG PO TABS: 50.0000 mg | ORAL_TABLET | Freq: Three times a day (TID) | ORAL | 0 refills | Status: DC | PRN

## 2024-01-19 NOTE — Patient Instructions (Signed)
 If you do not hear anything about your referral in the next 1-2 weeks, call our office and ask for an update.  Do not drink alcohol, do any illicit/street drugs, drive or do anything that requires alertness while on this medicine.   Heat (pad or rice pillow in microwave) over affected area, 10-15 minutes twice daily.   Ice/cold pack over area for 10-15 min twice daily.  Let us  know if you need anything.

## 2024-01-19 NOTE — Progress Notes (Signed)
 Musculoskeletal Exam  Patient: Brenda Dunn DOB: 04-25-1951  DOS: 01/19/2024  SUBJECTIVE:  Chief Complaint:   Chief Complaint  Patient presents with   Leg Pain    Left Leg Pain    Brenda Dunn is a 73 y.o.  female for evaluation and treatment of L thigh pain.   Onset:  2 weeks ago. No inj or change in activity.  Location: lateral thigh Character:  aching  Progression of issue:  has worsened Associated symptoms: radiates  Treatment: to date has been rest, ice, acetaminophen , steroid injection, and muscle relaxers.   Neurovascular symptoms: no  OAB Currently taking Myrbetriq  50 mg daily.  Reports compliance no adverse effects.  Working pretty well for her.  Unfortunately the cost changed to $47 per month which she cannot afford.  She called her insurance company and was told our office needs to do an exemption.  Past Medical History:  Diagnosis Date   Anxiety    Benzodiazepine dependence (HCC)    Colonic polyp    mulitple polyps. repeat in 05/2019   Constipation    IBS   COPD (chronic obstructive pulmonary disease) (HCC)    Depression    GERD (gastroesophageal reflux disease)    Hyperlipidemia    Hypertension    Insomnia    Migraine headache    OAB (overactive bladder)    Osteoarthritis    Osteoporosis 12/19/2017   Post-surgical hypothyroidism    Pulmonary embolism (HCC)    Eliquis  to stop 05/03/19   Scoliosis     Objective: VITAL SIGNS: BP 126/72 (BP Location: Left Arm, Patient Position: Sitting)   Pulse 81   Temp 98 F (36.7 C) (Oral)   Resp 16   Ht 4' 11 (1.499 m)   Wt 122 lb (55.3 kg)   SpO2 98%   BMI 24.64 kg/m  Constitutional: Well formed, well developed. No acute distress. Thorax & Lungs: No accessory muscle use Musculoskeletal: L hip.   Normal active range of motion: yes.   Normal passive range of motion: yes Tenderness to palpation: yes over greater troch and down the lateral portion of the thigh over the IT band Deformity: no Ecchymosis:  no Tests positive: none Tests negative: Stinchfield, logroll, FABER, FADIR Neurologic: Normal sensory function. Antalgic gait.  Psychiatric: Normal mood. Age appropriate judgment and insight. Alert & oriented x 3.    Assessment:  Lateral pain of left hip - Plan: traMADol  (ULTRAM ) 50 MG tablet, Ambulatory referral to Sports Medicine  Iliotibial band syndrome of left side  OAB (overactive bladder)  Plan: Refer sports med. Tramadol  for pain. Cont Robaxin prn, Tylenol . Try to do the IT band stretches/exercises, heat, ice, Tylenol .  As above. Chronic, technically controlled.  Continue Myrbetriq  50 mg daily.  Having cost issues with Myrbetriq . Called ins and told we need to do an exemption. I will involve our clinical pharmacist.  F/u prn. The patient voiced understanding and agreement to the plan.  I spent 30 minutes with the patient discussing the above plans in addition to reviewing her chart on the same day of the visit.  Shellie Dials [place], DO 01/19/24  11:52 AM

## 2024-01-19 NOTE — Telephone Encounter (Signed)
 Pt has appt today

## 2024-01-23 ENCOUNTER — Telehealth: Payer: Self-pay | Admitting: Family Medicine

## 2024-01-23 ENCOUNTER — Ambulatory Visit: Admitting: Family Medicine

## 2024-01-23 ENCOUNTER — Ambulatory Visit

## 2024-01-23 ENCOUNTER — Other Ambulatory Visit: Payer: Self-pay | Admitting: Family Medicine

## 2024-01-23 DIAGNOSIS — M25552 Pain in left hip: Secondary | ICD-10-CM

## 2024-01-23 MED ORDER — TRAMADOL HCL 50 MG PO TABS: 50.0000 mg | ORAL_TABLET | Freq: Three times a day (TID) | ORAL | 0 refills | Status: DC | PRN

## 2024-01-23 NOTE — Telephone Encounter (Signed)
 When you call pt about medication please also advise that the Advance Directive she dropped off was not notarized correctly and it needs to be re-notarized. I can notarize for pt and we can redo the last page where she signs so she does not have to re-do the whole document. Additionally pt has listed on advance directive she does not want to be resuscitated but does not have a DNR on file.

## 2024-01-23 NOTE — Telephone Encounter (Signed)
 Placed Advance Directive in filing cabinet in the front until pt can come back for notarization.

## 2024-01-23 NOTE — Telephone Encounter (Signed)
 Pt came in requesting more tramadol  at the [facility] [address] [place]. Pt also is seeing sports doctor his Thursday wanted to let pcp know. Please call patient when medicine is called in

## 2024-01-24 ENCOUNTER — Other Ambulatory Visit: Payer: Self-pay

## 2024-01-24 ENCOUNTER — Ambulatory Visit: Admitting: Family Medicine

## 2024-01-24 ENCOUNTER — Encounter: Admitting: Sports Medicine

## 2024-01-24 ENCOUNTER — Telehealth: Payer: Self-pay

## 2024-01-24 ENCOUNTER — Encounter: Payer: Self-pay | Admitting: Family Medicine

## 2024-01-24 VITALS — BP 122/70 | HR 86 | Temp 98.8°F | Resp 16 | Ht 59.0 in | Wt 122.0 lb

## 2024-01-24 DIAGNOSIS — J029 Acute pharyngitis, unspecified: Secondary | ICD-10-CM | POA: Diagnosis not present

## 2024-01-24 DIAGNOSIS — M25552 Pain in left hip: Secondary | ICD-10-CM

## 2024-01-24 LAB — POCT RAPID STREP A (OFFICE): Rapid Strep A Screen: NEGATIVE

## 2024-01-24 MED ORDER — METHYLPREDNISOLONE ACETATE 40 MG/ML IJ SUSP
40.0000 mg | Freq: Once | INTRAMUSCULAR | Status: AC
  Administered 2024-01-19: 40 mg via INTRAMUSCULAR

## 2024-01-24 MED ORDER — METHYLPREDNISOLONE ACETATE 40 MG/ML IJ SUSP
40.0000 mg | Freq: Once | INTRAMUSCULAR | Status: AC
  Administered 2024-01-24: 40 mg via INTRAMUSCULAR

## 2024-01-24 MED ORDER — LIDOCAINE VISCOUS HCL 2 % MT SOLN
OROMUCOSAL | 0 refills | Status: DC
Start: 2024-01-24 — End: 2024-06-17

## 2024-01-24 NOTE — Addendum Note (Signed)
 Addended by: Shawndell Schillaci M on: 01/24/2024 11:45 AM   Modules accepted: Orders

## 2024-01-24 NOTE — Telephone Encounter (Signed)
 Copied from CRM [id number]. Topic: Clinical - Medication Question >> Jan 24, 2024  2:02 PM Geroldine GRADE wrote: Reason for CRM: Patient Is calling because she stated she forgot to ask if she be prescribed something for her throat, it is very raw feeling she stated

## 2024-01-24 NOTE — Telephone Encounter (Signed)
 I can send something to gargle but once that shot kicks in, she should start feeling better. Thx.

## 2024-01-24 NOTE — Telephone Encounter (Signed)
 Sent pt mychart message letting her know what Dr.[name] said about medication.

## 2024-01-24 NOTE — Progress Notes (Signed)
 SUBJECTIVE:   Brenda Dunn is a 73 y.o. female presents to the clinic for:  Chief Complaint  Patient presents with   Sore Throat    Sore throat     Complains of sore throat for 2 days.  Other associated symptoms: ST.  Denies: sinus congestion, sinus pain, rhinorrhea, itchy watery eyes, ear pain, ear drainage, wheezing, shortness of breath, myalgia, and fevers Sick Contacts: none known Therapy to date: salt water gargles, cough syrup  Social History   Tobacco Use  Smoking Status Every Day   Current packs/day: 1.50   Average packs/day: 1.5 packs/day for 44.0 years (66.0 ttl pk-yrs)   Types: Cigarettes  Smokeless Tobacco Never    Patient's medications, allergies, past medical, surgical, social and family histories were reviewed and updated as appropriate.  OBJECTIVE:  BP 122/70 (BP Location: Left Arm, Patient Position: Sitting)   Pulse 86   Temp 98.8 F (37.1 C) (Oral)   Resp 16   Ht 4' 11 (1.499 m)   Wt 122 lb (55.3 kg)   SpO2 99%   BMI 24.64 kg/m  General: Awake, alert, appearing stated age Eyes: conjunctivae and sclerae clear Ears: normal TMs bilaterally Nose: no visible exudate Oropharynx: lips, mucosa, and tongue normal; teeth and gums normal, tonsillar pillars hyperemic, no pharyngeal exudate/erythema Neck: supple, no significant adenopathy Lungs: clear to auscultation, no wheezes, rales or rhonchi, symmetric air entry, normal effort Heart: RRR Skin:reveals no rash Psych: Age appropriate judgment and insight  ASSESSMENT/PLAN:  Viral pharyngitis  1/4 Centor Criteria. Neg rapid strep. Viral etiology likely. Continue to practice good hand hygiene and push fluids. Depo injection today.  Acetaminophen  for pain. F/u prn. Pt voiced understanding and agreement to the plan.  Mabel [place], DO 01/24/24 11:35 AM

## 2024-01-24 NOTE — Addendum Note (Signed)
 Addended by: Tyronda Vizcarrondo M on: 01/24/2024 02:08 PM   Modules accepted: Orders

## 2024-01-24 NOTE — Patient Instructions (Signed)
OK to take Tylenol 1000 mg (2 extra strength tabs) or 975 mg (3 regular strength tabs) every 6 hours as needed.  Consider throat lozenges, salt water gargles and an air humidifier for symptomatic care.   Let us know if you need anything.

## 2024-01-24 NOTE — Addendum Note (Signed)
 Addended by: FRANN MABEL SQUIBB on: 01/24/2024 04:33 PM   Modules accepted: Orders

## 2024-01-25 ENCOUNTER — Ambulatory Visit

## 2024-01-25 ENCOUNTER — Ambulatory Visit (INDEPENDENT_AMBULATORY_CARE_PROVIDER_SITE_OTHER): Admitting: Sports Medicine

## 2024-01-25 ENCOUNTER — Ambulatory Visit: Payer: Self-pay

## 2024-01-25 ENCOUNTER — Encounter: Payer: Self-pay | Admitting: Sports Medicine

## 2024-01-25 DIAGNOSIS — G8929 Other chronic pain: Secondary | ICD-10-CM

## 2024-01-25 DIAGNOSIS — M25552 Pain in left hip: Secondary | ICD-10-CM | POA: Diagnosis not present

## 2024-01-25 DIAGNOSIS — M51369 Other intervertebral disc degeneration, lumbar region without mention of lumbar back pain or lower extremity pain: Secondary | ICD-10-CM | POA: Diagnosis not present

## 2024-01-25 DIAGNOSIS — M4807 Spinal stenosis, lumbosacral region: Secondary | ICD-10-CM | POA: Diagnosis not present

## 2024-01-25 DIAGNOSIS — M160 Bilateral primary osteoarthritis of hip: Secondary | ICD-10-CM | POA: Diagnosis not present

## 2024-01-25 DIAGNOSIS — M5126 Other intervertebral disc displacement, lumbar region: Secondary | ICD-10-CM | POA: Diagnosis not present

## 2024-01-25 DIAGNOSIS — I878 Other specified disorders of veins: Secondary | ICD-10-CM | POA: Diagnosis not present

## 2024-01-25 DIAGNOSIS — M47816 Spondylosis without myelopathy or radiculopathy, lumbar region: Secondary | ICD-10-CM | POA: Diagnosis not present

## 2024-01-25 MED ORDER — PREDNISONE 50 MG PO TABS: ORAL_TABLET | ORAL | 0 refills | Status: DC

## 2024-01-25 NOTE — Telephone Encounter (Signed)
 Wrong office, needs to Dr. Nathalie office.

## 2024-01-25 NOTE — Assessment & Plan Note (Addendum)
 This is a 73 year old female, she is here as a referral from Dr. Frann, she has had a several week history of pain left lateral hip with radiation down the lateral thigh to the knee but not past the knee. She has had an extensive workup and treatment with her primary care provider, she has had medications, she had a trochanteric bursa injection, unfortunately she did not get any relief. She does report that the pain is debilitating. Denies bowel or bladder dysfunction, saddle numbness or constitutional symptoms. She also reported that she was unable to do the home conditioning exercises prescribed to her. Today on exam she walks with a limp, she has good internal and external rotation of her left hip, she does not have any discrete tenderness to palpation over the greater trochanter. She does have some tenderness over the left lower paralumbar musculature and left sacroiliac joint. I do suspect some of her pain may be referred from the lumbar spine. We will add some lumbar spine and hip x-rays, 5 days of prednisone  for immediate relief. She is on several sedating agents so we will avoid gabapentin  unless absolutely necessary. I also explained her that I would like to send home health physical therapy to her house as core conditioning was exquisitely important. I like to see her back in about 6 weeks and we can consider additional treatment if not better.  Of note during the visit she asked me to call her sister to describe her treatment plan.

## 2024-01-25 NOTE — Telephone Encounter (Signed)
 FYI Only or Action Required?: Action required by provider: clinical question for provider.  Patient was last seen in primary care on 01/25/2024 by Curtis Debby PARAS, MD. Called Nurse Triage reporting Medication Assistance. Symptoms began today. Interventions attempted: Other: N/A. Symptoms are: unchanged.  Triage Disposition: Call PCP Now  Patient/caregiver understands and will follow disposition?: Yes  **Please see note below**    Reason for Disposition  [1] Caller has URGENT medicine question about med that PCP or specialist prescribed AND [2] triager unable to answer question  Answer Assessment - Initial Assessment Questions Patient called in stating that yesterday she was given a shot similar to prednisone  by PCP, she stated she was also given a Rx for oral prednisone  x 5 days by provider Debby Lennox. Patient wants to know if it is advised to take the oral Prednisone  as well?  Protocols used: Medication Question Call-A-AH

## 2024-01-25 NOTE — Progress Notes (Signed)
    Procedures performed today:    None.  Independent interpretation of notes and tests performed by another provider:   None.  Brief History, Exam, Impression, and Recommendations:    Chronic left hip pain This is a 73 year old female, she is here as a referral from Dr. Frann, she has had a several week history of pain left lateral hip with radiation down the lateral thigh to the knee but not past the knee. She has had an extensive workup and treatment with her primary care provider, she has had medications, she had a trochanteric bursa injection, unfortunately she did not get any relief. She does report that the pain is debilitating. Denies bowel or bladder dysfunction, saddle numbness or constitutional symptoms. She also reported that she was unable to do the home conditioning exercises prescribed to her. Today on exam she walks with a limp, she has good internal and external rotation of her left hip, she does not have any discrete tenderness to palpation over the greater trochanter. She does have some tenderness over the left lower paralumbar musculature and left sacroiliac joint. I do suspect some of her pain may be referred from the lumbar spine. We will add some lumbar spine and hip x-rays, 5 days of prednisone  for immediate relief. She is on several sedating agents so we will avoid gabapentin  unless absolutely necessary. I also explained her that I would like to send home health physical therapy to her house as core conditioning was exquisitely important. I like to see her back in about 6 weeks and we can consider additional treatment if not better.  Of note during the visit she asked me to call her sister to describe her treatment plan.    ____________________________________________ Debby PARAS. Curtis, M.D., ABFM., CAQSM., AME. Primary Care and Sports Medicine [facility]  Adjunct Professor of [facility]  [employer]  Restaurant manager, fast food

## 2024-01-26 ENCOUNTER — Ambulatory Visit: Payer: Self-pay | Admitting: *Deleted

## 2024-01-26 ENCOUNTER — Telehealth: Payer: Self-pay

## 2024-01-26 ENCOUNTER — Other Ambulatory Visit: Payer: Self-pay | Admitting: Family Medicine

## 2024-01-26 ENCOUNTER — Ambulatory Visit: Payer: Self-pay

## 2024-01-26 MED ORDER — AZITHROMYCIN 250 MG PO TABS: ORAL_TABLET | ORAL | 0 refills | Status: DC

## 2024-01-26 MED ORDER — AZITHROMYCIN 250 MG PO TABS
ORAL_TABLET | ORAL | 0 refills | Status: DC
Start: 2024-01-26 — End: 2024-01-26

## 2024-01-26 NOTE — Telephone Encounter (Signed)
 FYI Only or Action Required?: Action required by provider: update on patient condition.  Patient was last seen in primary care on 01/25/2024 by Curtis Debby PARAS, MD. Called Nurse Triage reporting Results and Cough. Symptoms began several days ago. Interventions attempted: OTC medications: tylenol , Prescription medications: depo-medrol  injection, and Rest, hydration, or home remedies. Symptoms are: productive cough with green mucus, hoarse voice, sore throat unchanged.  Triage Disposition: Call PCP Now (overriding See Physician Within 24 Hours)  Patient/caregiver understands and will follow disposition?: Yes                   Copied from CRM [id number]. Topic: Clinical - Red Word Triage >> Jan 26, 2024 11:58 AM Alfonso ORN wrote: Red Word that prompted transfer to Nurse Triage: coughing up green mucus  Extreme tiredness and throat is raw ,  Patient was tested for strep throat and came back negative  Patient was given a shot however not feeling any better  Patient call back number [phone] Reason for Disposition  [1] Known COPD or other severe lung disease (i.e., bronchiectasis, cystic fibrosis, lung surgery) AND [2] worsening symptoms (i.e., increased sputum purulence or amount, increased breathing difficulty  Answer Assessment - Initial Assessment Questions Patient seen on 6/25 with Dr Frann and states she feels the same.   1. ONSET: When did the cough begin?      X several days.  2. SEVERITY: How bad is the cough today?      She states yesterday it was just once in a while, today she states she has been coughing 3-4 times.  3. SPUTUM: Describe the color of your sputum (none, dry cough; clear, white, yellow, green)     Green.  4. HEMOPTYSIS: Are you coughing up any blood? If so ask: How much? (flecks, streaks, tablespoons, etc.)     No.  5. DIFFICULTY BREATHING: Are you having difficulty breathing? If Yes, ask: How bad is it? (e.g., mild,  moderate, severe)    - MILD: No SOB at rest, mild SOB with walking, speaks normally in sentences, can lie down, no retractions, pulse < 100.    - MODERATE: SOB at rest, SOB with minimal exertion and prefers to sit, cannot lie down flat, speaks in phrases, mild retractions, audible wheezing, pulse 100-120.    - SEVERE: Very SOB at rest, speaks in single words, struggling to breathe, sitting hunched forward, retractions, pulse > 120      No.  6. FEVER: Do you have a fever? If Yes, ask: What is your temperature, how was it measured, and when did it start?     No.  7. CARDIAC HISTORY: Do you have any history of heart disease? (e.g., heart attack, congestive heart failure)      No.  8. LUNG HISTORY: Do you have any history of lung disease?  (e.g., pulmonary embolus, asthma, emphysema)     COPD, PE.  9. PE RISK FACTORS: Do you have a history of blood clots? (or: recent major surgery, recent prolonged travel, bedridden)     She states she thinks in 2018 or 2019 they found a blood clot in her lung.  10. OTHER SYMPTOMS: Do you have any other symptoms? (e.g., runny nose, wheezing, chest pain)       Sore throat, hoarse voice.  11. PREGNANCY: Is there any chance you are pregnant? When was your last menstrual period?       N/A.  12. TRAVEL: Have you traveled out of the country in the  last month? (e.g., travel history, exposures)       No.  Protocols used: Cough - Acute Productive-A-AH

## 2024-01-26 NOTE — Telephone Encounter (Signed)
 Will send another med. Thx.

## 2024-01-26 NOTE — Telephone Encounter (Signed)
 Brenda Dunn [phone] Called regarding referral for PT  States that they need a more definitive dx than  Left hip pain due to medicare requirements.

## 2024-01-26 NOTE — Telephone Encounter (Signed)
 Yes, please just tell her to be patient due to the radiology staffing issues.  I did look at the images myself, she has severe lumbar degenerative disc disease and facet arthritis, minimal changes in the hips, as we discussed in the office visit I do think her pain is more coming from her lumbar spine, I did not see any acute issues like fractures.  No change in plan for now.

## 2024-01-26 NOTE — Telephone Encounter (Signed)
 Spoke with patient.  Informed of results as read by Dr. Curtis - reiterated that this was not a final radiology reading. She is asking that once the final results are received if they could be placed in mail to her.  In notes from visit with Dr. Curtis states to avoid Gabapentin  unless absolutely necessary. Patient states she can not stop taking this medication daily - states she feels is absolutely neccessary for her - takes 1800mg  daily to help control her pain, help with sleep and keep her legs from jumping .  Patient also states that she was given a Depomedrol injection with PCP on Wednesday 01/24/24 when she saw Dr. Curtis on 01/25/2024 he gave her oral prednisone  to take for 5 days - she is wanting to know if she should start the oral prednisone  Per Dr. Curtis patient can start the medication - patient was informed .  She also mentioned that she is still not feeling well and hardly feels like getting out of bed - after seeing her PCP - states her throat is very raw feeling and just feeling bad in general. In speaking with patient  informed that if symptoms worsen or if she felt necessary that she would go to ER over the weekend. She did promise to do this if she felt she should.

## 2024-01-26 NOTE — Telephone Encounter (Signed)
 Copied from CRM [id number]. Topic: Clinical - Lab/Test Results >> Jan 26, 2024  8:09 AM Carmell SAUNDERS wrote: Reason for CRM: Pt asking about her xray results from yesterday. Please f/u. >> Jan 26, 2024 12:27 PM Fredrica W wrote: Patient called back. States she spoke with Corean who provided her with some information Dr T provided for her imaging results. Results not showing final yet. She would like to see if this can be sent to her MyChart. What she was told by stephanie and what she needs to do. ThankYou

## 2024-01-26 NOTE — Telephone Encounter (Signed)
 See nurse triage note yesterday, Dr. Lona prescribed the prednisone , she needs to call his office for questions.

## 2024-01-26 NOTE — Telephone Encounter (Signed)
 Spoke with patient and gave clarification regarding x-ray results as interpreted by Dr. Curtis.

## 2024-01-26 NOTE — Telephone Encounter (Signed)
 Copied from CRM [id number]. Topic: Clinical - Medical Advice >> Jan 26, 2024 11:57 AM Brenda Dunn ORN wrote: Reason for CRM: patient stated  [name],[name] DO gave patient a on  the shot on wednesday  01/24/24 seen  ,and seen Dr. ONEIDA on 01/25/24  want to give patient an injection similar to the prednisone   do not know which one to take  do not know whether to take the injection that prescribed by Dr. ONEIDA or not  please contact patient to let know  patient [phone]

## 2024-01-26 NOTE — Telephone Encounter (Signed)
 Copied from CRM [id number]. Topic: Clinical - Pink Word Triage >> Jan 25, 2024  1:03 PM Carrielelia G wrote: Reason for Triage:Question regarding  predniSONE  (DELTASONE ) 50 MG tablet >> Jan 25, 2024  4:52 PM Chiquita SQUIBB wrote: Patient is calling in again regarding the medication, patient is asking if she should take the predniSONE  (DELTASONE ) 50 MG tablet that her other doctor gave her, since Dr. Frann has already given her the shot. Patient is asking if the oral Prednisone   will affect the shot she got.  >> Jan 25, 2024  1:09 PM Carrielelia G wrote: Reason for Triage:Question regarding  predniSONE  (DELTASONE ) 50 MG tablet

## 2024-01-26 NOTE — Telephone Encounter (Signed)
 Read Dr Nathalie message to patietnt:  Yes, please just tell her to be patient due to the radiology staffing issues.  I did look at the images myself, she has severe lumbar degenerative disc disease and facet arthritis, minimal changes in the hips, as we discussed in the office visit I do think her pain is more coming from her lumbar spine, I did not see any acute issues like fractures.  No change in plan for now.    Patient is requesting if the results can be texted to her. Patient also would like to address with Dr T if she should be taking the prednisone  oral tablets that he prescribed because she received a Depo-Medrol  injection on 01/24/24 from her PCP. Please address the concern with her.

## 2024-01-26 NOTE — Telephone Encounter (Unsigned)
 Copied from CRM [id number]. Topic: Clinical - Medication Question >> Jan 26, 2024  8:02 AM Carmell SAUNDERS wrote: Reason for CRM: Pt is asking since she got a shot of methylPREDNISolone  acetate (DEPO-MEDROL ) injection 40 mg  by her pcp 01/24/24, should she still take the predniSONE  (DELTASONE ) 50 MG tablets prescribed yesterday by Dr. ONEIDA? Please contact pt.

## 2024-01-26 NOTE — Telephone Encounter (Signed)
 Spoke with Dr. Curtis - he states to go ahead and start the oral prednisone .  Patient informed

## 2024-01-26 NOTE — Telephone Encounter (Signed)
 Okay tell them its lumbar spinal stenosis with left lumbar radiculitis.

## 2024-01-26 NOTE — Telephone Encounter (Signed)
 Attempted call to patient. Connected to patient but phone was breaking up and she could not understand me- attempted to recall patient and happened again - attempted call to patient on office extra cell phone and happened again. Patient states she will attempt to call me instead.

## 2024-01-26 NOTE — Telephone Encounter (Signed)
 These x-rays are just performed yesterday. Should we hold off on calling radiology scheduling to have them expedite the reading?

## 2024-01-26 NOTE — Telephone Encounter (Signed)
 Copied from CRM [id number]. Topic: Clinical - Lab/Test Results >> Jan 26, 2024  8:09 AM Carmell SAUNDERS wrote: Reason for CRM: Pt asking about her xray results from yesterday. Please f/u.

## 2024-01-26 NOTE — Addendum Note (Signed)
 Addended by: FRANN MABEL SQUIBB on: 01/26/2024 03:46 PM   Modules accepted: Orders

## 2024-01-29 ENCOUNTER — Telehealth: Payer: Self-pay

## 2024-01-29 ENCOUNTER — Telehealth: Payer: Self-pay | Admitting: Family Medicine

## 2024-01-29 DIAGNOSIS — M25552 Pain in left hip: Secondary | ICD-10-CM

## 2024-01-29 MED ORDER — TRAMADOL HCL 50 MG PO TABS: 50.0000 mg | ORAL_TABLET | Freq: Two times a day (BID) | ORAL | 0 refills | Status: DC | PRN

## 2024-01-29 MED ORDER — METHYLPREDNISOLONE ACETATE 80 MG/ML IJ SUSP
80.0000 mg | Freq: Once | INTRAMUSCULAR | Status: AC
  Administered 2024-01-10: 80 mg via INTRAMUSCULAR

## 2024-01-29 NOTE — Telephone Encounter (Signed)
 Copied from CRM [id number]. Topic: Clinical - Medication Refill >> Jan 29, 2024  1:51 PM Suzen RAMAN wrote: Medication: Tramadol  50 MG  Has the patient contacted their pharmacy? Yes  This is the patient's preferred pharmacy:  [phone] [place],  - [address] [place] [address] Phone: [phone] Fax: [phone]  Is this the correct pharmacy for this prescription? Yes If no, delete pharmacy and type the correct one.   Has the prescription been filled recently? No  Is the patient out of the medication? Yes  Has the patient been seen for an appointment in the last year OR does the patient have an upcoming appointment? Yes  Can we respond through MyChart? Yes  Agent: Please be advised that Rx refills may take up to 3 business days. We ask that you follow-up with your pharmacy.

## 2024-01-29 NOTE — Telephone Encounter (Signed)
 Dr.[name] sent another med.

## 2024-01-29 NOTE — Telephone Encounter (Signed)
 Copied from CRM [id number]. Topic: Clinical - Medication Question >> Jan 29, 2024  1:53 PM Brenda Dunn wrote: Reason for CRM: Patient would like a call back pertaining to whether or not the provider would like her to continue taking Z-Pak. [phone]

## 2024-01-29 NOTE — Telephone Encounter (Signed)
 [place] nine informed.

## 2024-01-29 NOTE — Addendum Note (Signed)
 Addended by: Lexander Tremblay M on: 01/29/2024 03:15 PM   Modules accepted: Orders

## 2024-01-29 NOTE — Addendum Note (Signed)
 Addended by: Itai Barbian M on: 01/29/2024 03:23 PM   Modules accepted: Orders

## 2024-01-29 NOTE — Addendum Note (Signed)
 Addended by: FRANN MABEL SQUIBB on: 01/29/2024 02:49 PM   Modules accepted: Orders

## 2024-01-30 ENCOUNTER — Telehealth: Payer: Self-pay

## 2024-01-30 ENCOUNTER — Other Ambulatory Visit: Payer: Self-pay | Admitting: Family Medicine

## 2024-01-30 MED ORDER — HYDROCODONE BIT-HOMATROP MBR 5-1.5 MG/5ML PO SOLN: 5.0000 mL | Freq: Three times a day (TID) | ORAL | 0 refills | Status: DC | PRN

## 2024-01-30 NOTE — Telephone Encounter (Signed)
 Finish it please if it helped. Thx.

## 2024-01-30 NOTE — Telephone Encounter (Signed)
 Called pt was per Dr.[name] to not take tramadol  while on cough medicine . It can make her drowsy and To be careful. Pt stated understand.

## 2024-01-30 NOTE — Telephone Encounter (Signed)
 Pt was advised.

## 2024-01-30 NOTE — Telephone Encounter (Signed)
 Message the pt letting her know to finish medication.

## 2024-01-30 NOTE — Telephone Encounter (Signed)
 Copied from CRM [id number]. Topic: Clinical - Medical Advice >> Jan 30, 2024  2:26 PM Merlynn A wrote: Reason for CRM: Sheppard Borg from [facility] called in regarding patients PT. Sam is unable to complete PT for patient because patient is not home bound. Patient needs to be home bound in order for him to complete the PT. He advised patient will need to come into clinic to complete therapy. Please contact Sam with any additional questions at [phone]

## 2024-01-30 NOTE — Telephone Encounter (Signed)
 Please let him know she is ill and debilitated and it was extremely difficult for her to get out of the house.  She has weakness, confusion, and SOB when ambulating from a parking lot to office and back.  This makes her effectively homebound.

## 2024-01-30 NOTE — Telephone Encounter (Signed)
 Copied from CRM [id number]. Topic: Clinical - Medication Question >> Jan 30, 2024  8:02 AM Mercedes MATSU wrote: Reason for CRM: Patient called in stating that she wanted to know if she could be prescribed Codeine  >> Jan 30, 2024  8:14 AM Mesmerise C wrote: Patient stated that Dr. Frann was supposed to speak to a woman downstairs mirabegron  ER (MYRBETRIQ ) 50 MG TB24 tablet because insurance wasn't covering for it for a 30 day supply would like an update and if he's followed through with it patient can be reached at [phone]

## 2024-01-30 NOTE — Telephone Encounter (Signed)
 Copied from CRM [id number]. Topic: Clinical - Medication Question >> Jan 30, 2024  1:27 PM Shereese L wrote: Reason for CRM: Patient called in and stated that she needs codeine  for her raw throat.

## 2024-01-30 NOTE — Telephone Encounter (Signed)
 Called spoke with Pt and was advised Dr.[name] Is going to send in something for her throat, Pt stated  Understand. Appt canceled.

## 2024-01-31 ENCOUNTER — Ambulatory Visit: Admitting: Family Medicine

## 2024-01-31 ENCOUNTER — Ambulatory Visit: Payer: Self-pay | Admitting: Sports Medicine

## 2024-01-31 NOTE — Telephone Encounter (Signed)
 Spoke with Sheppard Borg with [facility] health - he states when he went out to evaluate this patient. She was just returning form shopping at the [employer] tree. He states the patient told him she is driving and going wherever she wants.  He states the patient would  benefit more from outpatient PT. States as home PT they would be assist to  help her get in out of showers by herself and things like this that she is currently already doing on her own. He states patient was smoking and asked Sam to leave the home at time of visit.

## 2024-02-01 NOTE — Telephone Encounter (Signed)
 Haha oh my goodness!  Tell him my apologies.  She painted a very different picture in the office.

## 2024-02-05 ENCOUNTER — Other Ambulatory Visit: Payer: Self-pay | Admitting: Family Medicine

## 2024-02-05 ENCOUNTER — Telehealth: Payer: Self-pay | Admitting: Family Medicine

## 2024-02-05 ENCOUNTER — Telehealth: Payer: Self-pay | Admitting: Sports Medicine

## 2024-02-05 DIAGNOSIS — M25552 Pain in left hip: Secondary | ICD-10-CM

## 2024-02-05 DIAGNOSIS — G8929 Other chronic pain: Secondary | ICD-10-CM

## 2024-02-05 NOTE — Telephone Encounter (Signed)
 Switching referral to here

## 2024-02-05 NOTE — Telephone Encounter (Signed)
 Message sent if still taking the cough syrup?

## 2024-02-05 NOTE — Telephone Encounter (Signed)
 Pt called ion stating that [facility] couldn't do her home home therapy due to her not being home bound, she states that the referral needs to be outpatient instead.

## 2024-02-05 NOTE — Telephone Encounter (Signed)
 Copied from CRM [id number]. Topic: Clinical - Medication Refill >> Feb 05, 2024  8:02 AM [place] wrote: Medication: traMADol  (ULTRAM ) 50 MG tablet  Has the patient contacted their pharmacy? Yes (Agent: If no, request that the patient contact the pharmacy for the refill. If patient does not wish to contact the pharmacy document the reason why and proceed with request.) (Agent: If yes, when and what did the pharmacy advise?)  This is the patient's preferred pharmacy:  [facility] [phone] - [place], [place] - [address] [place] [address] Phone: [phone] Fax: [phone]  Is this the correct pharmacy for this prescription? Yes If no, delete pharmacy and type the correct one.   Has the prescription been filled recently? Yes  Is the patient out of the medication? Yes  Has the patient been seen for an appointment in the last year OR does the patient have an upcoming appointment? Yes  Can we respond through MyChart? Yes  Agent: Please be advised that Rx refills may take up to 3 business days. We ask that you follow-up with your pharmacy.

## 2024-02-05 NOTE — Telephone Encounter (Signed)
 Is she still taking the cough syrup?

## 2024-02-06 ENCOUNTER — Other Ambulatory Visit: Payer: Self-pay | Admitting: Family Medicine

## 2024-02-06 MED ORDER — TRAMADOL HCL 50 MG PO TABS: 50.0000 mg | ORAL_TABLET | Freq: Three times a day (TID) | ORAL | 0 refills | Status: AC | PRN

## 2024-02-07 NOTE — Telephone Encounter (Signed)
 Brenda Dunn with [facility] health care informed.

## 2024-02-09 ENCOUNTER — Other Ambulatory Visit: Payer: Self-pay | Admitting: Family Medicine

## 2024-02-09 NOTE — Telephone Encounter (Signed)
 Copied from CRM [id number]. Topic: Clinical - Medication Refill >> Feb 09, 2024  8:09 AM Suzen RAMAN wrote: Medication: traMADol  (ULTRAM ) 50 MG tablet  Has the patient contacted their pharmacy? Yes  This is the patient's preferred pharmacy:  [phone] [place], [place] - [address] [place] [address] Phone: [phone] Fax: [phone]  Is this the correct pharmacy for this prescription? Yes If no, delete pharmacy and type the correct one.   Has the prescription been filled recently? Yes  Is the patient out of the medication? No  Has the patient been seen for an appointment in the last year OR does the patient have an upcoming appointment? Yes  Can we respond through MyChart? Yes  Agent: Please be advised that Rx refills may take up to 3 business days. We ask that you follow-up with your pharmacy.

## 2024-02-12 ENCOUNTER — Encounter: Payer: Self-pay | Admitting: Rehabilitative and Restorative Service Providers"

## 2024-02-12 ENCOUNTER — Ambulatory Visit: Attending: Sports Medicine | Admitting: Rehabilitative and Restorative Service Providers"

## 2024-02-12 ENCOUNTER — Other Ambulatory Visit: Payer: Self-pay

## 2024-02-12 DIAGNOSIS — R29898 Other symptoms and signs involving the musculoskeletal system: Secondary | ICD-10-CM | POA: Diagnosis present

## 2024-02-12 DIAGNOSIS — M25552 Pain in left hip: Secondary | ICD-10-CM | POA: Diagnosis present

## 2024-02-12 DIAGNOSIS — M6281 Muscle weakness (generalized): Secondary | ICD-10-CM | POA: Diagnosis present

## 2024-02-12 DIAGNOSIS — G8929 Other chronic pain: Secondary | ICD-10-CM | POA: Diagnosis not present

## 2024-02-12 NOTE — Therapy (Signed)
 OUTPATIENT PHYSICAL THERAPY LOWER EXTREMITY EVALUATION   Patient Name: Brenda Dunn MRN: [id number] DOB:September 18, 1950, 73 y.o., female Today's Date: 02/12/2024  END OF SESSION:  PT End of Session - 02/12/24 0945     Visit Number 1    Number of Visits 16    Date for PT Re-Evaluation 04/12/24    Authorization Type [employer]    PT Start Time 0933    PT Stop Time 1014    PT Time Calculation (min) 41 min    Activity Tolerance Patient tolerated treatment well    Behavior During Therapy WFL for tasks assessed/performed          Past Medical History:  Diagnosis Date   Anxiety    Benzodiazepine dependence (HCC)    Colonic polyp    mulitple polyps. repeat in 05/2019   Constipation    IBS   COPD (chronic obstructive pulmonary disease) (HCC)    Depression    GERD (gastroesophageal reflux disease)    Hyperlipidemia    Hypertension    Insomnia    Migraine headache    OAB (overactive bladder)    Osteoarthritis    Osteoporosis 12/19/2017   Post-surgical hypothyroidism    Pulmonary embolism (HCC)    Eliquis  to stop 05/03/19   Scoliosis    Past Surgical History:  Procedure Laterality Date   ABDOMINAL AORTOGRAM W/LOWER EXTREMITY Bilateral 03/24/2020   Procedure: ABDOMINAL AORTOGRAM W/LOWER EXTREMITY;  Surgeon: Serene Gaile ORN, MD;  Location: [facility] IN[facility];  Service: Cardiovascular;  Laterality: Bilateral;   BREAST CYST ASPIRATION Left    HEMORRHOID SURGERY     PERIPHERAL VASCULAR INTERVENTION Bilateral 03/24/2020   Procedure: PERIPHERAL VASCULAR INTERVENTION;  Surgeon: Serene Gaile ORN, MD;  Location: [facility] INVAS[facility] CV [facility];  Service: Cardiovascular;  Laterality: Bilateral;  ILIAC STENTS   TH[facility]ROIDECTOMY     at [facility]   TUBAL LIGATION  1979   Patient Active Problem List   Diagnosis Date Noted   Vitamin D  deficiency 06/16/2021   Facet arthritis of lumbosacral region 04/27/2021   Benign essential tremor 03/23/2021   Bereavement 03/23/2021   Bilateral iliac artery  stenosis (HCC) 02/16/2021   Thrombocytopenia (HCC) 02/16/2021   Obesity (BMI 30-39.9) 02/17/2020   Chronic left hip pain 02/17/2020   History of pulmonary embolus (PE) 12/06/2019   Osteopenia of lumbar spine 12/06/2019   Chronic throat clearing 06/18/2018   Bunion 05/04/2018   Severe mood disorder without psychotic features (HCC) 04/16/2018   Osteoporosis 12/19/2017   Seasonal allergies 11/24/2017   Solitary pulmonary nodule on lung CT 06/02/2017   Tobacco abuse 05/02/2017   Essential hypertension    Gastroesophageal reflux disease    Postoperative hypothyroidism    Hyperlipidemia    Osteoarthritis    Depression    Chronic constipation    Scoliosis    Migraine headache    Insomnia    OAB (overactive bladder)    Colonic polyp    History of colonic polyps 02/06/2017   GAD (generalized anxiety disorder) 12/07/2016   RLS (restless legs syndrome) 12/07/2016    PCP: Mabel Pry, DO REFERRING PROVIDER: Debby Petties, MD REFERRING DIAG: [phone] (ICD-10-CM) - Chronic left hip pain  THERAPY DIAG:  Pain in left hip  Muscle weakness (generalized)  Other symptoms and signs involving the musculoskeletal system  Rationale for Evaluation and Treatment: Rehabilitation ONSET DATE: 02/05/24  SUBJECTIVE:  SUBJECTIVE STATEMENT: The patient reports that she has L hip pain that began 2 months ago with insidious onset. She gets pain  with IR (reaching to get her slipper to the side of the chair), getting in the car, and standing up and turning after toileting. Yesterday it really hurt (10/10) all day.  She has had an injection L hip, was prescribed prednisone  x 5 days (couldn't tell a difference). With questions she notes pain in her back with sweeping.  PERTINENT HISTORY: HTN, hyperlipidemia, COPD, h/o pulmonary embolism, R foot (appendage on L 2nd toe)  PAIN:  Are you having pain? Yes: NPRS scale: current  8 /10; goes up to 10/10  Pain location: L hip radiating into  anterior thigh and lateral thigh above knee Pain description: aching and hurting Aggravating factors: movement Relieving factors: rest  PRECAUTIONS: Other: osteoporosis  WEIGHT BEARING RESTRICTIONS: No  FALLS:  Has patient fallen in last 6 months? No  LIVING ENVIRONMENT: Lives with: lives alone Lives in: House/apartment Stairs: No Has following equipment at home: None  OCCUPATION: retired  PLOF: Independent  PATIENT GOALS: Less pain  OBJECTIVE:  Note: Objective measures were completed at Evaluation unless otherwise noted.  DIAGNOSTIC FINDINGS: xray on 01/31/24: IMPRESSION: 1. Mild dextrocurvature centered at L3-4, similar to prior. 2. Moderate to severe multilevel degenerative disc, endplate, and joint changes, similar to prior.   PATIENT SURVEYS:  LEFS =26.3%   SENSATION: WFL  MUSCLE LENGTH: Tightness associated in quads, hamstrings  POSTURE: scoliosis noted of spine with L hip elevated as compared to R hip (shortened on L side)  PALPATION: Did not put in prone for palpation of lumbar spine  LUMBAR ROM:  *caution due to h/o osteoporosis Active  A/PROM  eval  Flexion   Extension   Right lateral flexion   Left lateral flexion   Right rotation   Left rotation    (Blank rows = not tested)   LOWER EXTREMITY ROM: tigthness noted-- formal ROM not tested Active ROM Right eval Left eval  Hip flexion    Hip extension    Hip abduction    Hip adduction    Hip internal rotation    Hip external rotation    Knee flexion    Knee extension    Ankle dorsiflexion    Ankle plantarflexion    Ankle inversion    Ankle eversion     (Blank rows = not tested)  LOWER EXTREMITY MMT: MMT Right eval Left eval  Hip flexion 5/5 3+/5  Hip extension    Hip abduction  2+/5  Hip adduction    Hip internal rotation    Hip external rotation    Knee flexion 5/5 4/5  Knee extension 5/5 4/5  Ankle dorsiflexion 4/5 4-/5  Ankle plantarflexion    Ankle inversion     Ankle eversion     (Blank rows = not tested)  FUNCTIONAL TESTS:  5 times sit to stand: TBA Timed up and go (TUG): TBA  GAIT: Distance walked: 75 feet Assistive device utilized: None Level of assistance: Complete Independence Comments: slowed pace, antalgic pattern  Endurance: patient has to sit frequently-- fatigued during standing posture assessment  [facility] Adult PT Treatment:                                                DATE: 02/12/24 Therapeutic Exercise: Supine Bent knee fallouts x 10 reps Marching-- painful so modified Sidelying Attempted clam shells--painful so deferred Attempted hip abduction--painful so deferred Sitting TA activation x 5 reps Pelvic tilts x 5 reps Self Care: Discussed resting positions and patient sits in rocking chair and leans on the R elbow to reach  her ashtray Recommended using a throw pillow to support her back and working to sit symmetrically  PATIENT EDUCATION:  Education details: plan of care, positioning in chairs, and home program Person educated: Patient Education method: Explanation, Demonstration, and Handouts Education comprehension: verbalized understanding, returned demonstration, and needs further education  HOME EXERCISE PROGRAM: Access Code: [email or web address] URL: [email or web address] Date: 02/12/2024 Prepared by: Tawni Ferrier  Exercises - Bent Knee Fallouts  - 1 x daily - 5 x weekly - 1 sets - 10 reps - Seated Pelvic Tilts  - 1 x daily - 5 x weekly - 1 sets - 10 reps - Seated Transversus Abdominis Bracing  - 1 x daily - 5 x weekly - 1 sets - 10 reps - 3-5 seconds hold  ASSESSMENT:  CLINICAL IMPRESSION: Patient is a 73 y.o. female who was seen today for physical therapy evaluation and treatment for chronic left hip pain. She has lumbar scoliosis as well and also notes intermittent back pain  (worse when sweeping at home). The patient presents with LE weakness, postural weakness, fatigue with standing, pain in L hip, pain in low back, tightness in HS, tightness in quads. PT to address deficits to improve functional mobility.    OBJECTIVE IMPAIRMENTS: Abnormal gait, decreased activity tolerance, decreased balance, decreased endurance, decreased ROM, decreased strength, hypomobility, increased fascial restrictions, impaired flexibility, improper body mechanics, postural dysfunction, and pain.   ACTIVITY LIMITATIONS: carrying, lifting, bending, standing, squatting, stairs, transfers, and locomotion level  PARTICIPATION LIMITATIONS: cleaning, shopping, and community activity  PERSONAL FACTORS: 3+ comorbidities: see above are also affecting patient's functional outcome.   REHAB POTENTIAL: Good  CLINICAL DECISION MAKING: Evolving/moderate complexity  EVALUATION COMPLEXITY: Moderate   GOALS: Goals reviewed with patient? Yes  SHORT TERM GOALS: Target date: 03/13/24  The patient will be indep with initial HEP Baseline: initiated at eval Goal status: INITIAL  2.  The patient will improve LEFS by 8% to demonstrate improved functional abilities. Baseline:  26.3% Goal status: INITIAL  3.   The patient will tolerate standing activities x 5 minutes.  Baseline:  Cannot tolerate standing for postural assessment Goal status: INITIAL  4.  The patient will report pain 8/10 at worst. Baseline:  10/10 Goal status: INITIAL  LONG TERM GOALS: Target date: 04/13/24  The patient will be indep with progression of HEP. Baseline:  initiated at eval Goal status: INITIAL  2.  The patient will improve LEFS by 12% to demonstrate improved functional abilities. Baseline: 26.3% Goal status: INITIAL  3.  The patient will demonstrate 3/5 L hip strength to improve stability during gait. Baseline:  2+/5 Goal status: INITIAL  4.  The patient will report pain < or equal to 5/10 to tolerate ADLs  and IADLs. Baseline: 10/10 Goal status: INITIAL  5.  The patient will be further assessed on TUG and 5 time sit to stand and goal to  follow. Baseline:  High Irritability at eval-- to assess Goal status: INITIAL  PLAN:  PT FREQUENCY: 2x/week  PT DURATION: 8 weeks  PLANNED INTERVENTIONS: [id number]- PT Re-evaluation, 97750- Physical Performance Testing, 97110-Therapeutic exercises, 97530- Therapeutic activity, 97112- Neuromuscular re-education, 97535- Self Care, [id number]- Manual therapy, [phone]- Gait training, Patient/Family education, Balance training, Stair training, Cryotherapy, and Moist heat  PLAN FOR NEXT SESSION: check TUG if able, 5 time sit<>stand when able, core strengthening -- may tolerate seated positions better at first (hooklying hurts for her), standing tolerance/endurance. CAUTION due to osteoporosis--no traction.   Tryphena Perkovich, PT 02/12/2024, 3:36 PM

## 2024-02-12 NOTE — Therapy (Signed)
 OUTPATIENT PHYSICAL THERAPY LOWER EXTREMITYTREATMENT   Patient Name: Brenda Dunn MRN: [id number] DOB:[birth year], 73 y.o., female Today's Date: 02/13/2024  END OF SESSION:  PT End of Session - 02/13/24 0847     Visit Number 2    Number of Visits 16    Date for PT Re-Evaluation 04/12/24    Authorization Type [employer]    PT Start Time 0847    PT Stop Time 0928    PT Time Calculation (min) 41 min    Activity Tolerance Patient tolerated treatment well    Behavior During Therapy [facility] for tasks assessed/performed           Past Medical History:  Diagnosis Date   Anxiety    Benzodiazepine dependence (HCC)    Colonic polyp    mulitple polyps. repeat in 05/2019   Constipation    IBS   COPD (chronic obstructive pulmonary disease) (HCC)    Depression    GERD (gastroesophageal reflux disease)    Hyperlipidemia    Hypertension    Insomnia    Migraine headache    OAB (overactive bladder)    Osteoarthritis    Osteoporosis 12/19/2017   Post-surgical hypothyroidism    Pulmonary embolism (HCC)    Eliquis  to stop 05/03/19   Scoliosis    Past Surgical History:  Procedure Laterality Date   ABDOMINAL AORTOGRAM W/LOWER EXTREMITY Bilateral 03/24/2020   Procedure: ABDOMINAL AORTOGRAM W/LOWER EXTREMITY;  Surgeon: Serene Gaile ORN, MD;  Location: [facility] IN[facility];  Service: Cardiovascular;  Laterality: Bilateral;   BREAST CYST ASPIRATION Left    HEMORRHOID SURGERY     PERIPHERAL VASCULAR INTERVENTION Bilateral 03/24/2020   Procedure: PERIPHERAL VASCULAR INTERVENTION;  Surgeon: Serene Gaile ORN, MD;  Location: [facility];  Service: Cardiovascular;  Laterality: Bilateral;  ILIAC STENTS   THYROIDECTOMY     at [facility]   TUBAL LIGATION  1979   Patient Active Problem List   Diagnosis Date Noted   Vitamin D  deficiency 06/16/2021   Facet arthritis of lumbosacral region 04/27/2021   Benign essential tremor 03/23/2021   Bereavement 03/23/2021   Bilateral iliac artery  stenosis (HCC) 02/16/2021   Thrombocytopenia (HCC) 02/16/2021   Obesity (BMI 30-39.9) 02/17/2020   Chronic left hip pain 02/17/2020   History of pulmonary embolus (PE) 12/06/2019   Osteopenia of lumbar spine 12/06/2019   Chronic throat clearing 06/18/2018   Bunion 05/04/2018   Severe mood disorder without psychotic features (HCC) 04/16/2018   Osteoporosis 12/19/2017   Seasonal allergies 11/24/2017   Solitary pulmonary nodule on lung CT 06/02/2017   Tobacco abuse 05/02/2017   Essential hypertension    Gastroesophageal reflux disease    Postoperative hypothyroidism    Hyperlipidemia    Osteoarthritis    Depression    Chronic constipation    Scoliosis    Migraine headache    Insomnia    OAB (overactive bladder)    Colonic polyp    History of colonic polyps 02/06/2017   GAD (generalized anxiety disorder) 12/07/2016   RLS (restless legs syndrome) 12/07/2016    PCP: Mabel Pry, DO REFERRING PROVIDER: Debby Petties, MD REFERRING DIAG: [phone] (ICD-10-CM) - Chronic left hip pain  THERAPY DIAG:  Pain in left hip  Muscle weakness (generalized)  Other symptoms and signs involving the musculoskeletal system  Rationale for Evaluation and Treatment: Rehabilitation ONSET DATE: 02/05/24  SUBJECTIVE:  SUBJECTIVE STATEMENT: It feels good today.  Eval: The patient reports that she has L hip pain that began 2 months ago  with insidious onset. She gets pain with IR (reaching to get her slipper to the side of the chair), getting in the car, and standing up and turning after toileting. Yesterday it really hurt (10/10) all day.  She has had an injection L hip, was prescribed prednisone  x 5 days (couldn't tell a difference). With questions she notes pain in her back with sweeping.  PERTINENT HISTORY: HTN, hyperlipidemia, COPD, h/o pulmonary embolism, R foot (appendage on L 2nd toe)  PAIN:  Are you having pain? Yes: NPRS scale: current  0 /10; goes up to 10/10  Pain  location: L hip radiating into anterior thigh and lateral thigh above knee Pain description: aching and hurting Aggravating factors: movement Relieving factors: rest  PRECAUTIONS: Other: osteoporosis  WEIGHT BEARING RESTRICTIONS: No  FALLS:  Has patient fallen in last 6 months? No  LIVING ENVIRONMENT: Lives with: lives alone Lives in: House/apartment Stairs: No Has following equipment at home: None  OCCUPATION: retired  PLOF: Independent  PATIENT GOALS: Less pain  OBJECTIVE:  Note: Objective measures were completed at Evaluation unless otherwise noted.  DIAGNOSTIC FINDINGS: xray on 01/31/24: IMPRESSION: 1. Mild dextrocurvature centered at L3-4, similar to prior. 2. Moderate to severe multilevel degenerative disc, endplate, and joint changes, similar to prior.   PATIENT SURVEYS:  LEFS =26.3%   SENSATION: WFL  MUSCLE LENGTH: Tightness associated in quads, hamstrings  POSTURE: scoliosis noted of spine with L hip elevated as compared to R hip (shortened on L side)  PALPATION: Did not put in prone for palpation of lumbar spine  LUMBAR ROM:  *caution due to h/o osteoporosis Active  A/PROM  eval  Flexion   Extension   Right lateral flexion   Left lateral flexion   Right rotation   Left rotation    (Blank rows = not tested)   LOWER EXTREMITY ROM: tigthness noted-- formal ROM not tested Active ROM Right eval Left eval  Hip flexion    Hip extension    Hip abduction    Hip adduction    Hip internal rotation    Hip external rotation    Knee flexion    Knee extension    Ankle dorsiflexion    Ankle plantarflexion    Ankle inversion    Ankle eversion     (Blank rows = not tested)  LOWER EXTREMITY MMT: MMT Right eval Left eval  Hip flexion 5/5 3+/5  Hip extension    Hip abduction  2+/5  Hip adduction    Hip internal rotation    Hip external rotation    Knee flexion 5/5 4/5  Knee extension 5/5 4/5  Ankle dorsiflexion 4/5 4-/5  Ankle  plantarflexion    Ankle inversion    Ankle eversion     (Blank rows = not tested)  FUNCTIONAL TESTS:  5 times sit to stand: TBA Timed up and go (TUG): TBA  GAIT: Distance walked: 75 feet Assistive device utilized: None Level of assistance: Complete Independence Comments: slowed pace, antalgic pattern  Endurance: patient has to sit frequently-- fatigued during standing posture assessment  [facility] Adult PT Treatment:                                                DATE: 02/13/24 Therapeutic Exercise: Sidelying clam shells x 10 L hip abduction L -at 45 deg  flexion 2x5 Seated Fig 4 stretch 2x30 sec B Seated 2# AW: march, ER, LAQ 1 x 10 ea Standing Heel raises 2# AW x 10 Standing hip ABD 2# x 10 B  Neuromuscular re-ed: Supine Bent knee fallouts x 10 reps with TA contraction Bridge x 10 Sitting TA activation x 5 reps Pelvic tilts x 5 reps Therapeutic Activity: TUG 11.73 sec 5xSTS 12.65 sec   [facility] Adult PT Treatment:                                                DATE: 02/12/24 Therapeutic Exercise: Supine Bent knee fallouts x 10 reps Marching-- painful so modified Sidelying Attempted clam shells--painful so deferred Attempted hip abduction--painful so deferred Sitting TA activation x 5 reps Pelvic tilts x 5 reps Self Care: Discussed resting positions and patient sits in rocking chair and leans on the R elbow to reach  her ashtray Recommended using a throw pillow to support her back and working to sit symmetrically  PATIENT EDUCATION:  Education details: plan of care, positioning in chairs, and home program Person educated: Patient Education method: Explanation, Demonstration, and Handouts Education comprehension: verbalized understanding, returned demonstration, and needs further education  HOME EXERCISE PROGRAM: Access Code: [email or web address] URL:  [email or web address] Date: 02/12/2024 Prepared by: Tawni Ferrier  Exercises - Bent Knee Fallouts  - 1 x daily - 5 x weekly - 1 sets - 10 reps - Seated Pelvic Tilts  - 1 x daily - 5 x weekly - 1 sets - 10 reps - Seated Transversus Abdominis Bracing  - 1 x daily - 5 x weekly - 1 sets - 10 reps - 3-5 seconds hold  ASSESSMENT:  CLINICAL IMPRESSION: Patient reports no pain upon arrival, but reports almost immediate pain into L thigh with WB. She was able to tolerated S/L exercises today although she fatigues fairly easily. Added fig 4 stretch, but patient reluctant to progress HEP too quickly. TUG and 5xSTS assessed today.   Eval: Patient is a 73 y.o. female who was seen today for physical therapy evaluation and treatment for chronic left hip pain. She has lumbar scoliosis as well and also notes intermittent back pain (worse when sweeping at home). The patient presents with LE weakness, postural weakness, fatigue with standing, pain in L hip, pain in low back, tightness in HS, tightness in quads. PT to address deficits to improve functional mobility.    OBJECTIVE IMPAIRMENTS: Abnormal gait, decreased activity tolerance, decreased balance, decreased endurance, decreased ROM, decreased strength, hypomobility, increased fascial restrictions, impaired flexibility, improper body mechanics, postural dysfunction, and pain.   ACTIVITY LIMITATIONS: carrying, lifting, bending, standing, squatting, stairs, transfers, and locomotion level  PARTICIPATION LIMITATIONS: cleaning, shopping, and community activity  PERSONAL FACTORS: 3+ comorbidities: see above are also affecting patient's functional outcome.   REHAB POTENTIAL: Good  CLINICAL DECISION MAKING: Evolving/moderate complexity  EVALUATION COMPLEXITY: Moderate   GOALS: Goals reviewed with patient? Yes  SHORT TERM GOALS: Target date: 03/13/24  The patient  will be indep with initial HEP Baseline: initiated at eval Goal status:  INITIAL  2.  The patient will improve LEFS by 8% to demonstrate improved functional abilities. Baseline:  26.3% Goal status: INITIAL  3.   The patient will tolerate standing activities x 5 minutes.  Baseline:  Cannot tolerate standing for postural assessment Goal status: INITIAL  4.  The patient will report pain 8/10 at worst. Baseline:  10/10 Goal status: INITIAL  LONG TERM GOALS: Target date: 04/13/24  The patient will be indep with progression of HEP. Baseline:  initiated at eval Goal status: INITIAL  2.  The patient will improve LEFS by 12% to demonstrate improved functional abilities. Baseline: 26.3% Goal status: INITIAL  3.  The patient will demonstrate 3/5 L hip strength to improve stability during gait. Baseline:  2+/5 Goal status: INITIAL  4.  The patient will report pain < or equal to 5/10 to tolerate ADLs and IADLs. Baseline: 10/10 Goal status: INITIAL  5.  The patient will be further assessed on TUG and 5 time sit to stand and goal to follow. Baseline:  High Irritability at eval-- to assess Goal status: INITIAL  PLAN:  PT FREQUENCY: 2x/week  PT DURATION: 8 weeks  PLANNED INTERVENTIONS: 97164- PT Re-evaluation, 97750- Physical Performance Testing, 97110-Therapeutic exercises, 97530- Therapeutic activity, 97112- Neuromuscular re-education, 97535- Self Care, [id number]- Manual therapy, [phone]- Gait training, Patient/Family education, Balance training, Stair training, Cryotherapy, and Moist heat  PLAN FOR NEXT SESSION:  core strengthening -- may tolerate seated positions better at first (hooklying hurts for her), standing tolerance/endurance. CAUTION due to osteoporosis--no traction.   Mliss Cummins, PT 02/13/24 9:33 AM

## 2024-02-13 ENCOUNTER — Encounter: Payer: Self-pay | Admitting: Physical Therapy

## 2024-02-13 ENCOUNTER — Ambulatory Visit: Admitting: Physical Therapy

## 2024-02-13 DIAGNOSIS — M6281 Muscle weakness (generalized): Secondary | ICD-10-CM

## 2024-02-13 DIAGNOSIS — M25552 Pain in left hip: Secondary | ICD-10-CM | POA: Diagnosis not present

## 2024-02-13 DIAGNOSIS — R29898 Other symptoms and signs involving the musculoskeletal system: Secondary | ICD-10-CM

## 2024-02-19 NOTE — Therapy (Addendum)
 OUTPATIENT PHYSICAL THERAPY LOWER EXTREMITYTREATMENT and DISCHARGE SUMMARY   Patient Name: Brenda Dunn MRN: [id number] DOB:July 08, 1951, 73 y.o., female Today's Date: 02/20/2024   PHYSICAL THERAPY DISCHARGE SUMMARY  Visits from Start of Care: 3  Current functional level related to goals / functional outcomes: SEE GOALS BELOW   Remaining deficits: Pain improved-- see below for last known patient status   Education / Equipment: HEP   Patient agrees to discharge. Patient goals were partially met. Patient is being discharged due to not returning since the last visit.  END OF SESSION:  PT End of Session - 02/20/24 0846     Visit Number 3    Number of Visits 16    Date for PT Re-Evaluation 04/12/24    Authorization Type [employer]    PT Start Time 0846    PT Stop Time 0930    PT Time Calculation (min) 44 min    Activity Tolerance Patient tolerated treatment well    Behavior During Therapy WFL for tasks assessed/performed            Past Medical History:  Diagnosis Date   Anxiety    Benzodiazepine dependence (HCC)    Colonic polyp    mulitple polyps. repeat in 05/2019   Constipation    IBS   COPD (chronic obstructive pulmonary disease) (HCC)    Depression    GERD (gastroesophageal reflux disease)    Hyperlipidemia    Hypertension    Insomnia    Migraine headache    OAB (overactive bladder)    Osteoarthritis    Osteoporosis 12/19/2017   Post-surgical hypothyroidism    Pulmonary embolism (HCC)    Eliquis  to stop 05/03/19   Scoliosis    Past Surgical History:  Procedure Laterality Date   ABDOMINAL AORTOGRAM W/LOWER EXTREMITY Bilateral 03/24/2020   Procedure: ABDOMINAL AORTOGRAM W/LOWER EXTREMITY;  Surgeon: Serene Gaile ORN, MD;  Location: [facility] INV[facility];  Service: Cardiovascular;  Laterality: Bilateral;   BREAST CYST ASPIRATION Left    HEMORRHOID SURGERY     PERIPHERAL VASCULAR INTERVENTION Bilateral 03/24/2020   Procedure: PERIPHERAL VASCULAR  INTERVENTION;  Surgeon: Serene Gaile ORN, MD;  Location: [facility]AS[facility] CV [facility];  Service: Cardiovascular;  Laterality: Bilateral;  ILIAC STENTS   THYROIDECTOMY     at [facility]   TUBAL LIGATION  1979   Patient Active Problem List   Diagnosis Date Noted   Vitamin D  deficiency 06/16/2021   Facet arthritis of lumbosacral region 04/27/2021   Benign essential tremor 03/23/2021   Bereavement 03/23/2021   Bilateral iliac artery stenosis (HCC) 02/16/2021   Thrombocytopenia (HCC) 02/16/2021   Obesity (BMI 30-39.9) 02/17/2020   Chronic left hip pain 02/17/2020   History of pulmonary embolus (PE) 12/06/2019   Osteopenia of lumbar spine 12/06/2019   Chronic throat clearing 06/18/2018   Bunion 05/04/2018   Severe mood disorder without psychotic features (HCC) 04/16/2018   Osteoporosis 12/19/2017   Seasonal allergies 11/24/2017   Solitary pulmonary nodule on lung CT 06/02/2017   Tobacco abuse 05/02/2017   Essential hypertension    Gastroesophageal reflux disease    Postoperative hypothyroidism    Hyperlipidemia    Osteoarthritis    Depression    Chronic constipation    Scoliosis    Migraine headache    Insomnia    OAB (overactive bladder)    Colonic polyp    History of colonic polyps 02/06/2017   GAD (generalized anxiety disorder) 12/07/2016   RLS (restless legs syndrome) 12/07/2016    PCP: Mabel  Frann, DO REFERRING PROVIDER: Debby Petties, MD REFERRING DIAG: [phone] (ICD-10-CM) - Chronic left hip pain  THERAPY DIAG:  Pain in left hip  Muscle weakness (generalized)  Other symptoms and signs involving the musculoskeletal system  Rationale for Evaluation and Treatment: Rehabilitation ONSET DATE: 02/05/24  SUBJECTIVE:  SUBJECTIVE STATEMENT: Patient states she is much better since last visit. Feeling pain here and there. Yesterday and today it has not been tight. Walking is better.   Eval: The patient reports that she has L hip pain that began 2 months ago with  insidious onset. She gets pain with IR (reaching to get her slipper to the side of the chair), getting in the car, and standing up and turning after toileting. Yesterday it really hurt (10/10) all day.  She has had an injection L hip, was prescribed prednisone  x 5 days (couldn't tell a difference). With questions she notes pain in her back with sweeping.  PERTINENT HISTORY: HTN, hyperlipidemia, COPD, h/o pulmonary embolism, R foot (appendage on L 2nd toe)  PAIN:  Are you having pain? Yes: NPRS scale: current  0 /10; goes up to 10/10  Pain location: L hip radiating into anterior thigh and lateral thigh above knee Pain description: aching and hurting Aggravating factors: movement Relieving factors: rest  PRECAUTIONS: Other: osteoporosis  WEIGHT BEARING RESTRICTIONS: No  FALLS:  Has patient fallen in last 6 months? No  LIVING ENVIRONMENT: Lives with: lives alone Lives in: House/apartment Stairs: No Has following equipment at home: None  OCCUPATION: retired  PLOF: Independent  PATIENT GOALS: Less pain  OBJECTIVE:  Note: Objective measures were completed at Evaluation unless otherwise noted.  DIAGNOSTIC FINDINGS: xray on 01/31/24: IMPRESSION: 1. Mild dextrocurvature centered at L3-4, similar to prior. 2. Moderate to severe multilevel degenerative disc, endplate, and joint changes, similar to prior.   PATIENT SURVEYS:  LEFS =26.3%   SENSATION: WFL  MUSCLE LENGTH: Tightness associated in quads, hamstrings  POSTURE: scoliosis noted of spine with L hip elevated as compared to R hip (shortened on L side)  PALPATION: Did not put in prone for palpation of lumbar spine  LUMBAR ROM:  *caution due to h/o osteoporosis Active  A/PROM  eval  Flexion   Extension   Right lateral flexion   Left lateral flexion   Right rotation   Left rotation    (Blank rows = not tested)   LOWER EXTREMITY ROM: tigthness noted-- formal ROM not tested Active ROM Right eval Left eval   Hip flexion    Hip extension    Hip abduction    Hip adduction    Hip internal rotation    Hip external rotation    Knee flexion    Knee extension    Ankle dorsiflexion    Ankle plantarflexion    Ankle inversion    Ankle eversion     (Blank rows = not tested)  LOWER EXTREMITY MMT: MMT Right eval Left eval  Hip flexion 5/5 3+/5  Hip extension    Hip abduction  2+/5  Hip adduction    Hip internal rotation    Hip external rotation    Knee flexion 5/5 4/5  Knee extension 5/5 4/5  Ankle dorsiflexion 4/5 4-/5  Ankle plantarflexion    Ankle inversion    Ankle eversion     (Blank rows = not tested)  FUNCTIONAL TESTS:  5 times sit to stand: TBA Timed up and go (TUG): TBA  GAIT: Distance walked: 75 feet Assistive device utilized: None Level of assistance:  Complete Independence Comments: slowed pace, antalgic pattern  Endurance: patient has to sit frequently-- fatigued during standing posture assessment                                                                                                                              [facility] Adult PT Treatment:                                                DATE: 02/20/24 Therapeutic Exercise: S/L clam shells x 10 L, then with yellow band x 10 S/L hip abduction L -at 45 deg  flexion 2x10 Seated Fig 4 stretch 2x20 sec B cues to hold rather than rock Seated 2# AW: march, ER, LAQ 1 x 10 ea Standing Heel raises 2# AW x 10 Standing HS curl 2#AW x 10 B Standing hip ABD 2# x 10 B  Neuromuscular re-ed: Supine with towel roll along spine  TA contraction with march x 10 B, then with alternating leg extension Bridge x 10, then with 3 sec hold x 10 Sitting TA activation x 5 reps Pelvic tilts x 5 reps    [facility] Adult PT Treatment:                                                DATE: 02/13/24 Therapeutic Exercise: Sidelying clam shells 2 x 10 L hip abduction L -at 45 deg  flexion 2x5 Seated Fig 4 stretch 2x30 sec B Seated 2# AW:  march, ER, LAQ 1 x 10 ea Standing Heel raises 2# AW x 10 Standing hip ABD 2# x 10 B  Neuromuscular re-ed: Supine Bent knee fallouts x 10 reps with TA contraction Bridge x 10 Sitting TA activation x 5 reps Pelvic tilts x 5 reps Therapeutic Activity: TUG 11.73 sec 5xSTS 12.65 sec   [facility] Adult PT Treatment:                                                DATE: 02/12/24 Therapeutic Exercise: Supine Bent knee fallouts x 10 reps Marching-- painful so modified Sidelying Attempted clam shells--painful so deferred Attempted hip abduction--painful so deferred Sitting TA activation x 5 reps Pelvic tilts x 5 reps Self Care: Discussed resting positions and patient sits in rocking chair and leans on the R elbow to reach  her ashtray Recommended using a throw pillow to support her back and working to sit symmetrically  PATIENT EDUCATION:  Education details: plan of care, positioning in chairs, and home program Person educated: Patient Education method: Explanation, Facilities Manager, and Handouts Education  comprehension: verbalized understanding, returned demonstration, and needs further education  HOME EXERCISE PROGRAM: Access Code: [email or web address] URL: [email or web address] Date: 02/12/2024 Prepared by: Tawni Ferrier  Exercises - Bent Knee Fallouts  - 1 x daily - 5 x weekly - 1 sets - 10 reps - Seated Pelvic Tilts  - 1 x daily - 5 x weekly - 1 sets - 10 reps - Seated Transversus Abdominis Bracing  - 1 x daily - 5 x weekly - 1 sets - 10 reps - 3-5 seconds hold  ASSESSMENT:  CLINICAL IMPRESSION: Patient reports no pain today. She is feeling pain here and there, but not longer constant. Walking is better and she is not having the tightness she previously had. She is pleased with her current level of function and stated at the end of the session that she needed to cancel her remaining appointments due to financial reasons. She reports compliance with HEP, even with exercises  that were done in the clinic, but not added to HEP. PT suggested we place her on hold for 30 days, in case symptoms worsen and pt agreed with this POC.   Eval: Patient is a 73 y.o. female who was seen today for physical therapy evaluation and treatment for chronic left hip pain. She has lumbar scoliosis as well and also notes intermittent back pain (worse when sweeping at home). The patient presents with LE weakness, postural weakness, fatigue with standing, pain in L hip, pain in low back, tightness in HS, tightness in quads. PT to address deficits to improve functional mobility.    OBJECTIVE IMPAIRMENTS: Abnormal gait, decreased activity tolerance, decreased balance, decreased endurance, decreased ROM, decreased strength, hypomobility, increased fascial restrictions, impaired flexibility, improper body mechanics, postural dysfunction, and pain.   ACTIVITY LIMITATIONS: carrying, lifting, bending, standing, squatting, stairs, transfers, and locomotion level  PARTICIPATION LIMITATIONS: cleaning, shopping, and community activity  PERSONAL FACTORS: 3+ comorbidities: see above are also affecting patient's functional outcome.   REHAB POTENTIAL: Good  CLINICAL DECISION MAKING: Evolving/moderate complexity  EVALUATION COMPLEXITY: Moderate   GOALS: Goals reviewed with patient? Yes  SHORT TERM GOALS: Target date: 03/13/24  The patient will be indep with initial HEP Baseline: initiated at eval Goal status: INITIAL  2.  The patient will improve LEFS by 8% to demonstrate improved functional abilities. Baseline:  26.3% Goal status: INITIAL  3.   The patient will tolerate standing activities x 5 minutes.  Baseline:  Cannot tolerate standing for postural assessment Goal status: MET 02/20/24  4.  The patient will report pain 8/10 at worst. Baseline:  10/10 Goal status: MET 02/20/24  LONG TERM GOALS: Target date: 04/13/24  The patient will be indep with progression of HEP. Baseline:  initiated  at eval Goal status: INITIAL  2.  The patient will improve LEFS by 12% to demonstrate improved functional abilities. Baseline: 26.3% Goal status: INITIAL  3.  The patient will demonstrate 3/5 L hip strength to improve stability during gait. Baseline:  2+/5 Goal status: INITIAL  4.  The patient will report pain < or equal to 5/10 to tolerate ADLs and IADLs. Baseline: 10/10 Goal status: INITIAL  5.  The patient will be further assessed on TUG and 5 time sit to stand and goal to follow. Baseline:  High Irritability at eval-- to assess Goal status: INITIAL  PLAN:  PT FREQUENCY: 2x/week  PT DURATION: 8 weeks  PLANNED INTERVENTIONS: 97164- PT Re-evaluation, 97750- Physical Performance Testing, 97110-Therapeutic exercises, 97530- Therapeutic activity, [id number]- Neuromuscular re-education, 97535- Self Care, [id number]- Manual therapy,  02883- Gait training, Patient/Family education, Balance training, Stair training, Cryotherapy, and Moist heat  PLAN FOR NEXT SESSION:  on hold until 03/20/24; if not return d/c. CAUTION due to osteoporosis--no traction.   Mliss Cummins, PT 02/20/24 4:06 PM

## 2024-02-20 ENCOUNTER — Ambulatory Visit: Admitting: Physical Therapy

## 2024-02-20 ENCOUNTER — Encounter: Payer: Self-pay | Admitting: Physical Therapy

## 2024-02-20 DIAGNOSIS — M25552 Pain in left hip: Secondary | ICD-10-CM | POA: Diagnosis not present

## 2024-02-20 DIAGNOSIS — M6281 Muscle weakness (generalized): Secondary | ICD-10-CM

## 2024-02-20 DIAGNOSIS — R29898 Other symptoms and signs involving the musculoskeletal system: Secondary | ICD-10-CM

## 2024-02-22 ENCOUNTER — Encounter: Admitting: Rehabilitative and Restorative Service Providers"

## 2024-02-26 ENCOUNTER — Telehealth: Payer: Self-pay

## 2024-02-26 ENCOUNTER — Other Ambulatory Visit: Payer: Self-pay

## 2024-02-26 DIAGNOSIS — N3281 Overactive bladder: Secondary | ICD-10-CM

## 2024-02-26 NOTE — Addendum Note (Signed)
 Addended by: CARLA MILLING B on: 02/26/2024 04:49 PM   Modules accepted: Orders

## 2024-02-26 NOTE — Telephone Encounter (Signed)
 Copied from CRM [id number]. Topic: Clinical - Medication Question >> Feb 26, 2024  8:04 AM Laymon HERO wrote: Reason for CRM: Patient calling to say that she only has 1 pill left of mirabegron  ER (MYRBETRIQ ) 50 MG TB24 tablet, wanting to know if there is another medication she can take- this is going to cost her $1500

## 2024-02-26 NOTE — Progress Notes (Addendum)
   02/26/2024 Name: Brenda Dunn MRN: [id number] DOB: January 14, 1951  No chief complaint on file.   Brenda Dunn is a 73 y.o. year old female who presented for a telephone visit.   They were referred to the pharmacist by their PCP for assistance in managing medication access.    Subjective:  Received message from PCP and his team that patient's cost of Myrbetriq  was > $1000.   Medication Access/Adherence  [phone] [place], [place] - [address] [place] [address] Phone: [phone] Fax: [phone]   Patient reports affordability concerns with their medications: Yes      Objective:  Lab Results  Component Value Date   HGBA1C 5.0 11/07/2023    Lab Results  Component Value Date   CREATININE 0.86 11/13/2023   BUN 7 (L) 11/13/2023   NA 137 11/13/2023   K 4.1 11/13/2023   CL 101 11/13/2023   CO2 21 11/13/2023    Lab Results  Component Value Date   CHOL 160 11/06/2023   HDL 42.30 11/06/2023   LDLCALC 83 11/06/2023   LDLDIRECT 82.0 03/09/2022   TRIG 172.0 (H) 11/06/2023   CHOLHDL 4 11/06/2023     Current Outpatient Medications  Medication Instructions   acetaminophen  (TYLENOL ) 1,000 mg, Every 6 hours PRN   albuterol  (VENTOLIN  HFA) 108 (90 Base) MCG/ACT inhaler 1-2 puffs, Inhalation, Every 4 hours PRN   atorvastatin  (LIPITOR) 40 MG tablet TAKE 1 TABLET BY MOUTH EVERYDAY AT BEDTIME   budesonide -formoterol  (SYMBICORT ) 160-4.5 MCG/ACT inhaler 2 puffs, 2 times daily   diazepam  (VALIUM ) 5 mg, Oral, 3 times daily, May take another 5 mg (=10 mg) at bedtime occas for sleep.   docusate sodium  (COLACE) 100-200 mg, Daily at bedtime   fenofibrate  (TRICOR ) 145 mg, Oral, Daily   hydrocortisone  (ANUSOL -HC) 2.5 % rectal cream 1 Application, Rectal, 2 times daily   lactulose  (CHRONULAC ) 10 g, Oral, 2 times daily   levothyroxine  (SYNTHROID ) 100 mcg, Oral, Daily   lidocaine  (XYLOCAINE ) 2 % solution Gargle 5-10 mL  every 4 hours as needed for throat pain. Do not swallow.   linaclotide  (LINZESS ) 290 mcg, Oral, Daily before breakfast   lisinopril  (ZESTRIL ) 40 mg, Oral, Daily   methocarbamol  (ROBAXIN ) 500 mg, Oral, Every 8 hours PRN   mirabegron  ER (MYRBETRIQ ) 50 mg, Daily   prazosin  (MINIPRESS ) 5 mg, Oral, Nightly, TAKE 1 CAPSULE BY MOUTH DAILY AT BEDTIME WITH 1 MG PRAZOSIN  (7 MG TOTAL DAILY DOSE)      Assessment/Plan:   Medication Management / Access: - Reviewed patient's formulary. Myrbetriq  is preferred medication on [employer] - it is tier 3.  - [facility] mirabegron  ER 50mg  to the brand. Change to brand lowered cost to $12.15 for 90 days.    Follow Up Plan: patient to contact me if she has any future issues with medications   Madelin Ray, PharmD Clinical Pharmacist [facility]

## 2024-02-27 ENCOUNTER — Other Ambulatory Visit: Payer: Self-pay | Admitting: Family Medicine

## 2024-02-27 ENCOUNTER — Other Ambulatory Visit (HOSPITAL_BASED_OUTPATIENT_CLINIC_OR_DEPARTMENT_OTHER): Payer: Self-pay

## 2024-02-27 ENCOUNTER — Encounter: Admitting: Physical Therapy

## 2024-02-27 ENCOUNTER — Telehealth: Payer: Self-pay | Admitting: Family Medicine

## 2024-02-27 ENCOUNTER — Telehealth: Payer: Self-pay

## 2024-02-27 MED ORDER — FLUTICASONE-SALMETEROL 115-21 MCG/ACT IN AERO: 2.0000 | INHALATION_SPRAY | Freq: Two times a day (BID) | RESPIRATORY_TRACT | 1 refills | Status: DC

## 2024-02-27 NOTE — Addendum Note (Signed)
 Addended by: CARLA MILLING B on: 02/27/2024 05:16 PM   Modules accepted: Orders

## 2024-02-27 NOTE — Telephone Encounter (Signed)
 [facility]q  [facility] automatic refill but they were not able to do that. She would like to follow up with me in January 2026 when she has her 2026 Medicare plan to make sure no issues with medication costs.  Appt made for 08/05/2024 at 11am

## 2024-02-27 NOTE — Telephone Encounter (Signed)
 Copied from CRM [id number]. Topic: Clinical - Medication Question >> Feb 27, 2024  1:54 PM Viola F wrote: Reason for CRM: Patient insurance company won't cover the budesonide -formoterol  (SYMBICORT ) 160-4.5 MCG/ACT inhaler [[id number]] and she wants to know if Madelin Ray can help her , patient has 1 inhaler left. She says she's sorry for not mentioning it earlier during phone call.

## 2024-02-27 NOTE — Progress Notes (Signed)
   02/27/2024 Name: Brenda Dunn MRN: [id number] DOB: 02-11-1951  Chief Complaint  Patient presents with   Medication Problem    Brenda Dunn is a 73 y.o. year old female who presented for a telephone visit.   They were referred to the pharmacist by their PCP for assistance in managing medication access.    Subjective:  Patient is asking pharmacist to check into coverage of Symbicort  inhaler. She has taken in the past but stopped due to it not being covered by her insurance. She was switched to Breo inhaler but does not like it because she does not feel like she is getting any medication when she inhales. She admits that she has friend that has been giving her Symbicort  off and on.     Objective:  Lab Results  Component Value Date   HGBA1C 5.0 11/07/2023   Current Outpatient Medications  Medication Instructions   acetaminophen  (TYLENOL ) 1,000 mg, Every 6 hours PRN   albuterol  (VENTOLIN  HFA) 108 (90 Base) MCG/ACT inhaler 1-2 puffs, Inhalation, Every 4 hours PRN   atorvastatin  (LIPITOR) 40 MG tablet TAKE 1 TABLET BY MOUTH EVERYDAY AT BEDTIME   budesonide -formoterol  (SYMBICORT ) 160-4.5 MCG/ACT inhaler 2 puffs, 2 times daily   diazepam  (VALIUM ) 5 mg, Oral, 3 times daily, May take another 5 mg (=10 mg) at bedtime occas for sleep.   docusate sodium  (COLACE) 100-200 mg, Daily at bedtime   fenofibrate  (TRICOR ) 145 mg, Oral, Daily   hydrocortisone  (ANUSOL -HC) 2.5 % rectal cream 1 Application, Rectal, 2 times daily   lactulose  (CHRONULAC ) 10 g, Oral, 2 times daily   levothyroxine  (SYNTHROID ) 100 mcg, Oral, Daily   lidocaine  (XYLOCAINE ) 2 % solution Gargle 5-10 mL every 4 hours as needed for throat pain. Do not swallow.   linaclotide  (LINZESS ) 290 mcg, Oral, Daily before breakfast   lisinopril  (ZESTRIL ) 40 mg, Oral, Daily   methocarbamol  (ROBAXIN ) 500 mg, Oral, Every 8 hours PRN   mirabegron  ER (MYRBETRIQ ) 50 mg, Daily   prazosin  (MINIPRESS ) 5 mg, Oral, Nightly, TAKE 1 CAPSULE BY MOUTH  DAILY AT BEDTIME WITH 1 MG PRAZOSIN  (7 MG TOTAL DAILY DOSE)      Assessment/Plan:   Medication Management: - Reviewed her formulary. The following are tier 3 medications and would cost patient $12.15 - Wixela, , Breo or Advair HFA Tier 2 medications would be $5 or less - generic fluticasone /salmeterol - Since patient would prefer a metered dose inhaler will send in Advair HFA inhaler 250 / 50   Madelin Ray, PharmD Clinical Pharmacist [place] [facility]

## 2024-02-27 NOTE — Telephone Encounter (Signed)
 Copied from CRM [id number]. Topic: General - Other >> Feb 27, 2024  8:38 AM Macario HERO wrote: Reason for CRM: Patient is requesting a call back from [facility] regarding her prescription.

## 2024-02-27 NOTE — Telephone Encounter (Signed)
 Copied from CRM [id number]. Topic: General - Call Back - No Documentation >> Feb 27, 2024  4:57 PM Rea C wrote: Reason for CRM: Patient returned call for Tammy and stated that the medication is Symbicort .   Patients contact is [phone] (M).

## 2024-02-28 ENCOUNTER — Telehealth: Payer: Self-pay

## 2024-02-28 ENCOUNTER — Other Ambulatory Visit: Payer: Self-pay

## 2024-02-28 MED ORDER — FLUTICASONE-SALMETEROL 45-21 MCG/ACT IN AERO: 2.0000 | INHALATION_SPRAY | Freq: Two times a day (BID) | RESPIRATORY_TRACT | 12 refills | Status: DC

## 2024-02-28 NOTE — Progress Notes (Signed)
 Complex Care Management Care Guide Note  02/28/2024 Name: AIKA BRZOSKA MRN: [id number] DOB: [birth year]  BREEAN NANNINI is a 73 y.o. year old female who is a primary care patient of Frann, Mabel Mt, DO and is actively engaged with the care management team. I reached out to Keyla E Gilpin by phone today to assist with scheduling  with the Pharmacist.  Follow up plan: Outreach made by Madelin Ray, RPH. Patient has follow up scheduled.  Dreama [facility]  [facility], [facility] Health Care Management Assistant Direct Dial: [phone]  Fax: [phone]

## 2024-02-28 NOTE — Telephone Encounter (Signed)
 Please see other phone message from 02/27/2024 for details

## 2024-02-29 ENCOUNTER — Encounter: Admitting: Physical Therapy

## 2024-03-07 ENCOUNTER — Ambulatory Visit (INDEPENDENT_AMBULATORY_CARE_PROVIDER_SITE_OTHER): Admitting: Sports Medicine

## 2024-03-07 ENCOUNTER — Ambulatory Visit: Admitting: Sports Medicine

## 2024-03-07 ENCOUNTER — Telehealth: Payer: Self-pay

## 2024-03-07 DIAGNOSIS — M47816 Spondylosis without myelopathy or radiculopathy, lumbar region: Secondary | ICD-10-CM

## 2024-03-07 DIAGNOSIS — M810 Age-related osteoporosis without current pathological fracture: Secondary | ICD-10-CM

## 2024-03-07 NOTE — Progress Notes (Signed)
    Procedures performed today:    None.  Independent interpretation of notes and tests performed by another provider:   None.  Brief History, Exam, Impression, and Recommendations:    Lumbar spondylosis This very pleasant 73 year old female returns, she had a [place] workup and treatment for trochanteric bursitis, still had some pain we suspected more related to the low back, x-rays did confirm multilevel lumbar spondylosis. We did some prednisone , formal and then home health physical therapy, she returns 95% better, she will stop her extra Tylenol  and try arthritis for the couple of times a day. Continue home physical therapy, and she can return to see me as needed.  I spent 40 minutes of total time managing this patient today, this includes chart review, face to face, and non-face to face time.  Patient had multiple questions which were answered.  I did direct her questions regarding her transaminitis to be asked to her primary care provider.  ____________________________________________ Debby PARAS. Curtis, M.D., ABFM., CAQSM., AME. Primary Care and Sports Medicine [place] [facility]  Adjunct Professor of [facility] Medicine  [employer]  Restaurant manager, fast food

## 2024-03-07 NOTE — Telephone Encounter (Signed)
 OK. Maybe she can have it done at the same time as her mammogram. Order at [facility] as she lives closest to there. Thx.

## 2024-03-07 NOTE — Telephone Encounter (Signed)
 Copied from CRM [id number]. Topic: Clinical - Request for Lab/Test Order >> Mar 07, 2024  1:58 PM Berneda FALCON wrote: Reason for CRM: Pt told her that the patient should have a Bone Density test ordered every 2 years. She would like to know if she can get an order for one please.

## 2024-03-07 NOTE — Assessment & Plan Note (Signed)
 This very pleasant 73 year old female returns, she had a [place] workup and treatment for trochanteric bursitis, still had some pain we suspected more related to the low back, x-rays did confirm multilevel lumbar spondylosis. We did some prednisone , formal and then home health physical therapy, she returns 95% better, she will stop her extra Tylenol  and try arthritis for the couple of times a day. Continue home physical therapy, and she can return to see me as needed.

## 2024-03-08 NOTE — Telephone Encounter (Signed)
 Orders placed.

## 2024-03-08 NOTE — Addendum Note (Signed)
 Addended by: DORLENE CHIQUITA RAMAN on: 03/08/2024 02:51 PM   Modules accepted: Orders

## 2024-03-15 ENCOUNTER — Ambulatory Visit (INDEPENDENT_AMBULATORY_CARE_PROVIDER_SITE_OTHER)

## 2024-03-15 ENCOUNTER — Ambulatory Visit

## 2024-03-15 ENCOUNTER — Ambulatory Visit: Admitting: Podiatry

## 2024-03-15 ENCOUNTER — Encounter: Payer: Self-pay | Admitting: Podiatry

## 2024-03-15 DIAGNOSIS — M79674 Pain in right toe(s): Secondary | ICD-10-CM | POA: Diagnosis not present

## 2024-03-15 DIAGNOSIS — B351 Tinea unguium: Secondary | ICD-10-CM

## 2024-03-15 DIAGNOSIS — M778 Other enthesopathies, not elsewhere classified: Secondary | ICD-10-CM

## 2024-03-15 DIAGNOSIS — M7751 Other enthesopathy of right foot: Secondary | ICD-10-CM

## 2024-03-15 DIAGNOSIS — M79675 Pain in left toe(s): Secondary | ICD-10-CM | POA: Diagnosis not present

## 2024-03-15 DIAGNOSIS — I739 Peripheral vascular disease, unspecified: Secondary | ICD-10-CM | POA: Diagnosis not present

## 2024-03-15 DIAGNOSIS — D492 Neoplasm of unspecified behavior of bone, soft tissue, and skin: Secondary | ICD-10-CM

## 2024-03-15 MED ORDER — MUPIROCIN 2 % EX OINT
1.0000 | TOPICAL_OINTMENT | Freq: Two times a day (BID) | CUTANEOUS | 2 refills | Status: DC
Start: 2024-03-15 — End: 2024-06-17

## 2024-03-15 NOTE — Progress Notes (Unsigned)
 Subjective: Chief Complaint  Patient presents with   Toe Pain    growth removal right foot second toe. Non diabetic. 0 pain.   73 year old female presents the office today with multiple concerns.  The main concern is the growth on the right second toe.  We previously discussed surgery to excise this but has never had it performed.  She thinks the growth may have gotten bigger.  She also needs her nails trimmed as they are thickened elongated she cannot do them herself.  She is also concerned about right foot and ankle pain.  She is concerned about her circulation.  She does not recall any injuries.  No open sores.   Objective: AAO x3, NAD DP/PT pulses palpable bilaterally, CRT less than 3 seconds Nails are hypertrophic, dystrophic, brittle, discolored, elongated 10. No surrounding redness or drainage. Tenderness nails 1-5 bilaterally.  Large growth noted on the right second toe aspect. Able to appreciate any area pinpoint tenderness.  Extensor tendons appear to be intact.  MMT 5/5.  No significant edema to the ankle there is no area or warmth. No pain with calf compression, swelling, warmth, erythema  Assessment: Symptomatic onychosis, skin growth, ankle pain  Plan: Radiology 3 views right foot and 2 views of the right ankle were obtained.  No evidence of acute fracture noted.  Skin growth noted on the second digit.  Skin growth -We can discuss surgical excision of the lesion.  We discussed in center anesthesia versus local in the office.  Discussed with her excision, biopsy.  I will order an ABI to check circulation prior to surgery.  Symptomatic onychomycosis - Sharply debrided nails x 10 without any complications or bleeding  Ankle pain - I think her symptoms are multifactorial.  Will check circulation.  This could be component of nerve related issues versus musculoskeletal issues.  Discussed shoes, good arch support.  Anti-inflammatories as needed.  Icing daily.  If needed  consider ankle brace.  No follow-ups on file.  Brenda Dunn DPM

## 2024-03-18 ENCOUNTER — Telehealth: Payer: Self-pay | Admitting: Podiatry

## 2024-03-18 NOTE — Telephone Encounter (Signed)
 Patient called stating that she is experiencing pain in her right ankle making it hard for her to walk. She would like to know if she can be prescribed pain medication to help with the pain.

## 2024-03-18 NOTE — Telephone Encounter (Signed)
 Patient contacted office to get status of encounter. She is still complaining of walking difficulty. Thanks

## 2024-03-19 ENCOUNTER — Ambulatory Visit (HOSPITAL_COMMUNITY)
Admission: RE | Admit: 2024-03-19 | Discharge: 2024-03-19 | Disposition: A | Discharge status: Home / Self Care | Source: Ambulatory Visit | Attending: Podiatry | Admitting: Podiatry

## 2024-03-19 DIAGNOSIS — I739 Peripheral vascular disease, unspecified: Secondary | ICD-10-CM | POA: Insufficient documentation

## 2024-03-19 MED ORDER — MELOXICAM 7.5 MG PO TABS: 7.5000 mg | ORAL_TABLET | Freq: Every day | ORAL | 0 refills | Status: DC

## 2024-03-19 NOTE — Telephone Encounter (Signed)
 Patient called in and states she is able to take anti-inflammatory and [facility]

## 2024-03-20 ENCOUNTER — Other Ambulatory Visit: Payer: Self-pay | Admitting: Podiatry

## 2024-03-20 ENCOUNTER — Other Ambulatory Visit: Payer: Self-pay | Admitting: Family Medicine

## 2024-03-20 ENCOUNTER — Encounter: Payer: Self-pay | Admitting: Family Medicine

## 2024-03-20 ENCOUNTER — Encounter: Payer: Self-pay | Admitting: Podiatry

## 2024-03-20 DIAGNOSIS — K5909 Other constipation: Secondary | ICD-10-CM

## 2024-03-20 LAB — VAS US ABI WITH/WO TBI
Left ABI: 1.03
Right ABI: 1.11

## 2024-03-20 NOTE — Progress Notes (Signed)
 error

## 2024-03-20 NOTE — Telephone Encounter (Signed)
 Patient was seen by Dt. T on 03/07/24

## 2024-03-22 ENCOUNTER — Telehealth: Payer: Self-pay | Admitting: Podiatry

## 2024-03-22 ENCOUNTER — Ambulatory Visit: Payer: Self-pay | Admitting: Podiatry

## 2024-03-22 ENCOUNTER — Encounter: Payer: Self-pay | Admitting: Podiatry

## 2024-03-22 ENCOUNTER — Other Ambulatory Visit: Payer: Self-pay | Admitting: Podiatry

## 2024-03-22 MED ORDER — TRAMADOL HCL 50 MG PO TABS: 50.0000 mg | ORAL_TABLET | Freq: Three times a day (TID) | ORAL | 0 refills | Status: DC | PRN

## 2024-03-22 NOTE — Telephone Encounter (Signed)
 As per patient, your message sent on mychart to patient was not received. Patient contacted Mychart assistance, support team and was told to have provider resend message.

## 2024-03-23 ENCOUNTER — Other Ambulatory Visit: Payer: Self-pay

## 2024-03-23 ENCOUNTER — Ambulatory Visit
Admission: EM | Admit: 2024-03-23 | Discharge: 2024-03-23 | Disposition: A | Discharge status: Home / Self Care | Attending: Family Medicine | Admitting: Family Medicine

## 2024-03-23 DIAGNOSIS — M25571 Pain in right ankle and joints of right foot: Secondary | ICD-10-CM | POA: Diagnosis not present

## 2024-03-23 MED ORDER — CELECOXIB 200 MG PO CAPS: 200.0000 mg | ORAL_CAPSULE | Freq: Every day | ORAL | 0 refills | Status: AC

## 2024-03-23 NOTE — ED Provider Notes (Signed)
 [facility]    CSN: [id number] Arrival date & time: 03/23/24  1558      History   Chief Complaint Chief Complaint  Patient presents with   Ankle Pain    RT    HPI Brenda Dunn is a 73 y.o. female.   HPI 73 year old female presents with right ankle pain x 1 month.  Patient was evaluated by podiatry on 03/15/2024 please see epic for this encounter note.  Patient reports that tramadol  does not work for her.  PMH is significant for COPD, depression and HTN.  Past Medical History:  Diagnosis Date   Anxiety    Benzodiazepine dependence (HCC)    Colonic polyp    mulitple polyps. repeat in 05/2019   Constipation    IBS   COPD (chronic obstructive pulmonary disease) (HCC)    Depression    GERD (gastroesophageal reflux disease)    Hyperlipidemia    Hypertension    Insomnia    Migraine headache    OAB (overactive bladder)    Osteoarthritis    Osteoporosis 12/19/2017   Post-surgical hypothyroidism    Pulmonary embolism (HCC)    Eliquis  to stop 05/03/19   Scoliosis     Patient Active Problem List   Diagnosis Date Noted   Vitamin D  deficiency 06/16/2021   Facet arthritis of lumbosacral region 04/27/2021   Benign essential tremor 03/23/2021   Bereavement 03/23/2021   Bilateral iliac artery stenosis (HCC) 02/16/2021   Thrombocytopenia (HCC) 02/16/2021   Obesity (BMI 30-39.9) 02/17/2020   Lumbar spondylosis 02/17/2020   History of pulmonary embolus (PE) 12/06/2019   Osteopenia of lumbar spine 12/06/2019   Chronic throat clearing 06/18/2018   Bunion 05/04/2018   Severe mood disorder without psychotic features (HCC) 04/16/2018   Osteoporosis 12/19/2017   Seasonal allergies 11/24/2017   Solitary pulmonary nodule on lung CT 06/02/2017   Tobacco abuse 05/02/2017   Essential hypertension    Gastroesophageal reflux disease    Postoperative hypothyroidism    Hyperlipidemia    Osteoarthritis    Depression    Chronic constipation    Scoliosis    Migraine  headache    Insomnia    OAB (overactive bladder)    Colonic polyp    History of colonic polyps 02/06/2017   GAD (generalized anxiety disorder) 12/07/2016   RLS (restless legs syndrome) 12/07/2016    Past Surgical History:  Procedure Laterality Date   ABDOMINAL AORTOGRAM W/LOWER EXTREMITY Bilateral 03/24/2020   Procedure: ABDOMINAL AORTOGRAM W/LOWER EXTREMITY;  Surgeon: Serene Gaile ORN, MD;  Location: [facility]AS[facility];  Service: Cardiovascular;  Laterality: Bilateral;   BREAST CYST ASPIRATION Left    HEMORRHOID SURGERY     PERIPHERAL VASCULAR INTERVENTION Bilateral 03/24/2020   Procedure: PERIPHERAL VASCULAR INTERVENTION;  Surgeon: Serene Gaile ORN, MD;  Location: [facility] INVAS[facility] CV [facility];  Service: Cardiovascular;  Laterality: Bilateral;  ILIAC STENTS   THYROIDECTOMY     at [facility]   TUBAL LIGATION  1979    OB History   No obstetric history on file.      Home Medications    Prior to Admission medications   Medication Sig Start Date End Date Taking? Authorizing Provider  celecoxib  (CELEBREX ) 200 MG capsule Take 1 capsule (200 mg total) by mouth daily for 15 days. 03/23/24 04/07/24 Yes Teddy Sharper, FNP  traMADol  (ULTRAM ) 50 MG tablet Take 1 tablet (50 mg total) by mouth every 8 (eight) hours as needed for up to 5 days. 03/22/24 03/27/24  Gershon Donnice SAUNDERS,  DPM  acetaminophen  (TYLENOL ) 500 MG tablet Take 1,000 mg by mouth every 6 (six) hours as needed for moderate pain.    [provider]  albuterol  (VENTOLIN  HFA) 108 (90 Base) MCG/ACT inhaler Inhale 1-2 puffs into the lungs every 4 (four) hours as needed for wheezing or shortness of breath. 11/06/23   Frann Mabel Mt, DO  atorvastatin  (LIPITOR) 40 MG tablet TAKE 1 TABLET BY MOUTH EVERYDAY AT BEDTIME 09/22/23   [name], Mabel Mt, DO  diazepam  (VALIUM ) 5 MG tablet Take 1 tablet (5 mg total) by mouth in the morning, at noon, and at bedtime. May take another 5 mg (=10 mg) at bedtime occas for sleep. 11/20/23   Rhys Verneita DASEN, PA-C  docusate sodium  (COLACE) 100 MG capsule Take 100-200 mg by mouth at bedtime. Patient taking 4-6 capsules daily    [provider]  fenofibrate  (TRICOR ) 145 MG tablet Take 1 tablet (145 mg total) by mouth daily. 03/11/22   Frann Mabel Mt, DO  fluticasone -salmeterol (ADVAIR HFA) 115-21 MCG/ACT inhaler Inhale 2 puffs into the lungs 2 (two) times daily. Fill for either Advair HFA or generic HFA - depending on lowest cost with her HTA plan. (patient is requesting MDI and not DPI) 02/27/24   [name], Mabel Mt, DO  fluticasone -salmeterol (ADVAIR HFA) 45-21 MCG/ACT inhaler Inhale 2 puffs into the lungs 2 (two) times daily. 02/28/24   Frann Mabel Mt, DO  hydrocortisone  (ANUSOL -HC) 2.5 % rectal cream Place 1 Application rectally 2 (two) times daily. 06/21/23   Zehr, Jessica D, PA-C  lactulose  (CHRONULAC ) 10 GM/15ML solution TAKE 15 MLS (10 G TOTAL) BY MOUTH 2 (TWO) TIMES DAILY. 03/20/24   Frann Mabel Mt, DO  levothyroxine  (SYNTHROID ) 100 MCG tablet TAKE 1 TABLET BY MOUTH EVERY DAY 09/28/23   Frann Mabel Mt, DO  lidocaine  (XYLOCAINE ) 2 % solution Gargle 5-10 mL every 4 hours as needed for throat pain. Do not swallow. 01/24/24   Frann Mabel Mt, DO  linaclotide  (LINZESS ) 290 MCG CAPS capsule Take 1 capsule (290 mcg total) by mouth daily before breakfast. 07/03/23   [name], Mabel Mt, DO  lisinopril  (ZESTRIL ) 40 MG tablet Take 1 tablet (40 mg total) by mouth daily. 11/06/23   Frann Mabel Mt, DO  mirabegron  ER (MYRBETRIQ ) 50 MG TB24 tablet Take 50 mg by mouth daily.    [provider]  mupirocin  ointment (BACTROBAN ) 2 % Apply 1 Application topically 2 (two) times daily. 03/15/24   Gershon Donnice SAUNDERS, DPM  prazosin  (MINIPRESS ) 5 MG capsule Take 1 capsule (5 mg total) by mouth at bedtime. TAKE 1 CAPSULE BY MOUTH DAILY AT BEDTIME WITH 1 MG PRAZOSIN  (7 MG TOTAL DAILY DOSE) 12/29/23   [name], Mabel Mt, DO    Family History Family  History  Problem Relation Age of Onset   Breast cancer Mother    Arthritis Mother    Hearing loss Mother    Hyperlipidemia Mother    Hypertension Mother    Anxiety disorder Mother    Lung cancer Maternal Uncle    Anxiety disorder Maternal Uncle    Lung cancer Maternal Grandmother    Heart attack Maternal Grandmother    Anxiety disorder Maternal Grandmother    Asthma Father    COPD Father    Hyperlipidemia Father    Hypertension Father    Alcohol abuse Father    Anxiety disorder Sister    Hypertension Sister    Thyroid  cancer Sister    Anxiety disorder Brother    Hypertension Brother  Drug abuse Son    Anxiety disorder Sister    Hypertension Sister    Anxiety disorder Sister    Hypertension Sister    Hyperlipidemia Son     Social History Social History   Tobacco Use   Smoking status: Every Day    Current packs/day: 1.50    Average packs/day: 1.5 packs/day for 44.0 years (66.0 ttl pk-yrs)    Types: Cigarettes   Smokeless tobacco: Never  Vaping Use   Vaping status: Former  Substance Use Topics   Alcohol use: No   Drug use: No     Allergies   Patient has no known allergies.   Review of Systems Review of Systems  Musculoskeletal:        Right ankle pain x 1 month  All other systems reviewed and are negative.    Physical Exam Triage Vital Signs ED Triage Vitals  Encounter Vitals Group     BP      Girls Systolic BP Percentile      Girls Diastolic BP Percentile      Boys Systolic BP Percentile      Boys Diastolic BP Percentile      Pulse      Resp      Temp      Temp src      SpO2      Weight      Height      Head Circumference      Peak Flow      Pain Score      Pain Loc      Pain Education      Exclude from Growth Chart    No data found.  Updated Vital Signs BP 132/62 (BP Location: Right Arm)   Pulse 81   Temp 97.9 F (36.6 C) (Oral)   Resp 17   SpO2 94%    Physical Exam Vitals and nursing note reviewed.  Constitutional:       Appearance: Normal appearance. She is normal weight.  HENT:     Head: Normocephalic and atraumatic.     Mouth/Throat:     Mouth: Mucous membranes are moist.     Pharynx: Oropharynx is clear.  Eyes:     Extraocular Movements: Extraocular movements intact.     Pupils: Pupils are equal, round, and reactive to light.  Cardiovascular:     Rate and Rhythm: Normal rate and regular rhythm.     Pulses: Normal pulses.     Heart sounds: Normal heart sounds.  Pulmonary:     Effort: Pulmonary effort is normal.     Breath sounds: Normal breath sounds. No wheezing, rhonchi or rales.  Musculoskeletal:        General: Normal range of motion.     Comments: Right ankle: TTP over lateral malleolus, no deformity noted  Skin:    General: Skin is warm and dry.  Neurological:     General: No focal deficit present.     Mental Status: She is alert and oriented to person, place, and time. Mental status is at baseline.  Psychiatric:        Mood and Affect: Mood normal.        Behavior: Behavior normal.      UC Treatments / Results  Labs (all labs ordered are listed, but only abnormal results are displayed) Labs Reviewed - No data to display  EKG   Radiology No results found.  Procedures Procedures (including critical care time)  Medications Ordered in  UC Medications - No data to display  Initial Impression / Assessment and Plan / UC Course  I have reviewed the triage vital signs and the nursing notes.  Pertinent labs & imaging results that were available during my care of the patient were reviewed by me and considered in my medical decision making (see chart for details).     MDM: 1.  Right ankle pain-Rx'd Celebrex  200 mg capsule: Take 1 capsule daily x 15 days. Advised patient to discontinue Mobic .  Advised patient take medication as directed with food to completion.  Encouraged to increase daily water intake to 64 ounces per day while taking this medication.  Advised if symptoms worsen  and/or unresolved please follow-up with your PCP or [facility] orthopedics for further evaluation.  Patient discharged home, hemodynamically stable. Final Clinical Impressions(s) / UC Diagnoses   Final diagnoses:  Acute right ankle pain     Discharge Instructions      Advised patient to discontinue Mobic .  Advised patient take medication as directed with food to completion.  Encouraged to increase daily water intake to 64 ounces per day while taking this medication.  Advised if symptoms worsen and/or unresolved please follow-up with your PCP or [facility] orthopedics for further evaluation.     ED Prescriptions     Medication Sig Dispense Auth. Provider   celecoxib  (CELEBREX ) 200 MG capsule Take 1 capsule (200 mg total) by mouth daily for 15 days. 15 capsule Corleen Otwell, FNP      I have reviewed the PDMP during this encounter.   Teddy Sharper, FNP 03/23/24 [phone]

## 2024-03-23 NOTE — Discharge Instructions (Addendum)
 Advised patient to discontinue Mobic .  Advised patient take medication as directed with food to completion.  Encouraged to increase daily water intake to 64 ounces per day while taking this medication.  Advised if symptoms worsen and/or unresolved please follow-up with your PCP or [facility] orthopedics for further evaluation.

## 2024-03-23 NOTE — ED Triage Notes (Signed)
 Pt c/o RT ankle pain x 1 month. Saw Dr Gershon on 8/15. Rx'd tramadol  but states it doesn't work for pain.

## 2024-03-24 ENCOUNTER — Encounter: Payer: Self-pay | Admitting: Emergency Medicine

## 2024-03-24 ENCOUNTER — Ambulatory Visit: Admission: EM | Admit: 2024-03-24 | Discharge: 2024-03-24 | Disposition: A | Discharge status: Home / Self Care

## 2024-03-24 DIAGNOSIS — M25571 Pain in right ankle and joints of right foot: Secondary | ICD-10-CM

## 2024-03-24 NOTE — Discharge Instructions (Addendum)
 Advised patient to continue to previous prescribed Celebrex  and to follow-up with podiatry first thing tomorrow morning, Monday, 03/25/2024.

## 2024-03-24 NOTE — ED Provider Notes (Signed)
 [facility]    CSN: [id number] Arrival date & time: 03/24/24  1334      History   Chief Complaint No chief complaint on file.   HPI Brenda Dunn is a 73 y.o. female.   HPI 73 year old female presents with right ankle pain.  Patient was evaluated by me yesterday and prescribed Celebrex .  Patient reports taking Celebrex  for 2 days now and reports right ankle pain patient returns to clinic today requesting pain medication.  Past Medical History:  Diagnosis Date   Anxiety    Benzodiazepine dependence (HCC)    Colonic polyp    mulitple polyps. repeat in 05/2019   Constipation    IBS   COPD (chronic obstructive pulmonary disease) (HCC)    Depression    GERD (gastroesophageal reflux disease)    Hyperlipidemia    Hypertension    Insomnia    Migraine headache    OAB (overactive bladder)    Osteoarthritis    Osteoporosis 12/19/2017   Post-surgical hypothyroidism    Pulmonary embolism (HCC)    Eliquis  to stop 05/03/19   Scoliosis     Patient Active Problem List   Diagnosis Date Noted   Vitamin D  deficiency 06/16/2021   Facet arthritis of lumbosacral region 04/27/2021   Benign essential tremor 03/23/2021   Bereavement 03/23/2021   Bilateral iliac artery stenosis (HCC) 02/16/2021   Thrombocytopenia (HCC) 02/16/2021   Obesity (BMI 30-39.9) 02/17/2020   Lumbar spondylosis 02/17/2020   History of pulmonary embolus (PE) 12/06/2019   Osteopenia of lumbar spine 12/06/2019   Chronic throat clearing 06/18/2018   Bunion 05/04/2018   Severe mood disorder without psychotic features (HCC) 04/16/2018   Osteoporosis 12/19/2017   Seasonal allergies 11/24/2017   Solitary pulmonary nodule on lung CT 06/02/2017   Tobacco abuse 05/02/2017   Essential hypertension    Gastroesophageal reflux disease    Postoperative hypothyroidism    Hyperlipidemia    Osteoarthritis    Depression    Chronic constipation    Scoliosis    Migraine headache    Insomnia    OAB (overactive  bladder)    Colonic polyp    History of colonic polyps 02/06/2017   GAD (generalized anxiety disorder) 12/07/2016   RLS (restless legs syndrome) 12/07/2016    Past Surgical History:  Procedure Laterality Date   ABDOMINAL AORTOGRAM W/LOWER EXTREMITY Bilateral 03/24/2020   Procedure: ABDOMINAL AORTOGRAM W/LOWER EXTREMITY;  Surgeon: Serene Gaile ORN, MD;  Location: [facility];  Service: Cardiovascular;  Laterality: Bilateral;   BREAST CYST ASPIRATION Left    HEMORRHOID SURGERY     PERIPHERAL VASCULAR INTERVENTION Bilateral 03/24/2020   Procedure: PERIPHERAL VASCULAR INTERVENTION;  Surgeon: Serene Gaile ORN, MD;  Location: [facility];  Service: Cardiovascular;  Laterality: Bilateral;  ILIAC STENTS   TH[facility]ROIDECTOMY     at [facility]   TUBAL LIGATION  1979    OB History   No obstetric history on file.      Home Medications    Prior to Admission medications   Medication Sig Start Date End Date Taking? Authorizing Provider  acetaminophen  (TYLENOL ) 500 MG tablet Take 1,000 mg by mouth every 6 (six) hours as needed for moderate pain.   Yes [provider]  albuterol  (VENTOLIN  HFA) 108 (90 Base) MCG/ACT inhaler Inhale 1-2 puffs into the lungs every 4 (four) hours as needed for wheezing or shortness of breath. 11/06/23  Yes [name], Mabel Mt, DO  atorvastatin  (LIPITOR) 40 MG tablet TAKE 1 TABLET BY  MOUTH EVERYDAY AT BEDTIME 09/22/23  Yes [name], Mabel Mt, DO  celecoxib  (CELEBREX ) 200 MG capsule Take 1 capsule (200 mg total) by mouth daily for 15 days. 03/23/24 04/07/24 Yes Teddy Sharper, FNP  diazepam  (VALIUM ) 5 MG tablet Take 1 tablet (5 mg total) by mouth in the morning, at noon, and at bedtime. May take another 5 mg (=10 mg) at bedtime occas for sleep. 11/20/23  Yes Hurst, Verneita DASEN, PA-C  docusate sodium  (COLACE) 100 MG capsule Take 100-200 mg by mouth at bedtime. Patient taking 4-6 capsules daily   Yes [provider]  fenofibrate  (TRICOR ) 145 MG tablet  Take 1 tablet (145 mg total) by mouth daily. 03/11/22  Yes Frann Mabel Mt, DO  fluticasone -salmeterol (ADVAIR HFA) 115-21 MCG/ACT inhaler Inhale 2 puffs into the lungs 2 (two) times daily. Fill for either Advair HFA or generic HFA - depending on lowest cost with her HTA plan. (patient is requesting MDI and not DPI) 02/27/24  Yes [name], Mabel Mt, DO  fluticasone -salmeterol (ADVAIR HFA) 45-21 MCG/ACT inhaler Inhale 2 puffs into the lungs 2 (two) times daily. 02/28/24  Yes Frann Mabel Mt, DO  hydrocortisone  (ANUSOL -HC) 2.5 % rectal cream Place 1 Application rectally 2 (two) times daily. 06/21/23  Yes Zehr, Jessica D, PA-C  lactulose  (CHRONULAC ) 10 GM/15ML solution TAKE 15 MLS (10 G TOTAL) BY MOUTH 2 (TWO) TIMES DAILY. 03/20/24  Yes Frann Mabel Mt, DO  levothyroxine  (SYNTHROID ) 100 MCG tablet TAKE 1 TABLET BY MOUTH EVERY DAY 09/28/23  Yes Frann Mabel Mt, DO  lidocaine  (XYLOCAINE ) 2 % solution Gargle 5-10 mL every 4 hours as needed for throat pain. Do not swallow. 01/24/24  Yes Frann Mabel Mt, DO  linaclotide  (LINZESS ) 290 MCG CAPS capsule Take 1 capsule (290 mcg total) by mouth daily before breakfast. 07/03/23  Yes [name], Mabel Mt, DO  lisinopril  (ZESTRIL ) 40 MG tablet Take 1 tablet (40 mg total) by mouth daily. 11/06/23  Yes Frann Mabel Mt, DO  mirabegron  ER (MYRBETRIQ ) 50 MG TB24 tablet Take 50 mg by mouth daily.   Yes [provider]  mupirocin  ointment (BACTROBAN ) 2 % Apply 1 Application topically 2 (two) times daily. 03/15/24  Yes Gershon Donnice SAUNDERS, DPM  prazosin  (MINIPRESS ) 5 MG capsule Take 1 capsule (5 mg total) by mouth at bedtime. TAKE 1 CAPSULE BY MOUTH DAILY AT BEDTIME WITH 1 MG PRAZOSIN  (7 MG TOTAL DAILY DOSE) 12/29/23  Yes Frann Mabel Mt, DO  traMADol  (ULTRAM ) 50 MG tablet Take 1 tablet (50 mg total) by mouth every 8 (eight) hours as needed for up to 5 days. 03/22/24 03/27/24 Yes Gershon Donnice SAUNDERS, DPM    Family  History Family History  Problem Relation Age of Onset   Breast cancer Mother    Arthritis Mother    Hearing loss Mother    Hyperlipidemia Mother    Hypertension Mother    Anxiety disorder Mother    Lung cancer Maternal Uncle    Anxiety disorder Maternal Uncle    Lung cancer Maternal Grandmother    Heart attack Maternal Grandmother    Anxiety disorder Maternal Grandmother    Asthma Father    COPD Father    Hyperlipidemia Father    Hypertension Father    Alcohol abuse Father    Anxiety disorder Sister    Hypertension Sister    Thyroid  cancer Sister    Anxiety disorder Brother    Hypertension Brother    Drug abuse Son    Anxiety disorder Sister  Hypertension Sister    Anxiety disorder Sister    Hypertension Sister    Hyperlipidemia Son     Social History Social History   Tobacco Use   Smoking status: Every Day    Current packs/day: 1.50    Average packs/day: 1.5 packs/day for 44.0 years (66.0 ttl pk-yrs)    Types: Cigarettes   Smokeless tobacco: Never  Vaping Use   Vaping status: Former  Substance Use Topics   Alcohol use: No   Drug use: No     Allergies   Patient has no known allergies.   Review of Systems Review of Systems  Musculoskeletal:        Right ankle pain  All other systems reviewed and are negative.    Physical Exam Triage Vital Signs ED Triage Vitals  Encounter Vitals Group     BP 03/24/24 1437 108/68     Girls Systolic BP Percentile --      Girls Diastolic BP Percentile --      Boys Systolic BP Percentile --      Boys Diastolic BP Percentile --      Pulse Rate 03/24/24 1437 72     Resp 03/24/24 1437 18     Temp 03/24/24 1437 98.2 F (36.8 C)     Temp Source 03/24/24 1437 Oral     SpO2 03/24/24 1437 93 %     Weight --      Height --      Head Circumference --      Peak Flow --      Pain Score 03/24/24 1436 10     Pain Loc --      Pain Education --      Exclude from Growth Chart --    No data found.  Updated Vital  Signs BP 108/68 (BP Location: Right Arm)   Pulse 72   Temp 98.2 F (36.8 C) (Oral)   Resp 18   SpO2 93%    Physical Exam Vitals and nursing note reviewed.  Constitutional:      Appearance: Normal appearance. She is normal weight.  HENT:     Head: Normocephalic and atraumatic.     Mouth/Throat:     Mouth: Mucous membranes are moist.     Pharynx: Oropharynx is clear.  Eyes:     Extraocular Movements: Extraocular movements intact.     Pupils: Pupils are equal, round, and reactive to light.  Cardiovascular:     Rate and Rhythm: Normal rate and regular rhythm.     Pulses: Normal pulses.     Heart sounds: Normal heart sounds.  Pulmonary:     Effort: Pulmonary effort is normal.     Breath sounds: Normal breath sounds. No wheezing, rhonchi or rales.  Musculoskeletal:        General: Normal range of motion.  Skin:    General: Skin is warm and dry.  Neurological:     General: No focal deficit present.     Mental Status: She is alert and oriented to person, place, and time. Mental status is at baseline.  Psychiatric:        Mood and Affect: Mood normal.        Behavior: Behavior normal.      UC Treatments / Results  Labs (all labs ordered are listed, but only abnormal results are displayed) Labs Reviewed - No data to display  EKG   Radiology No results found.  Procedures Procedures (including critical care time)  Medications Ordered  in UC Medications - No data to display  Initial Impression / Assessment and Plan / UC Course  I have reviewed the triage vital signs and the nursing notes.  Pertinent labs & imaging results that were available during my care of the patient were reviewed by me and considered in my medical decision making (see chart for details).     MDM: 1.  Right ankle pain-right ankle x-ray read internal and not published per podiatry of 03/15/2024. Advised patient to continue to previous prescribed Celebrex  and to follow-up with podiatry first thing  tomorrow morning, Monday, 03/25/2024.  Final Clinical Impressions(s) / UC Diagnoses   Final diagnoses:  Acute right ankle pain     Discharge Instructions      Advised patient to continue to previous prescribed Celebrex  and to follow-up with podiatry first thing tomorrow morning, Monday, 03/25/2024.     ED Prescriptions   None    PDMP not reviewed this encounter.   Teddy Sharper, FNP 03/24/24 1601

## 2024-03-24 NOTE — ED Triage Notes (Signed)
 Patient presents to Urgent Care with complaints of ankle pain. Patient was seen yesterday for the ankle pain. Was prescribed Celebrex  Patient took the Celebrex , Tylenol  Arthritis and Tramadol  and did the Rice. The pain continued after sweeping her living and kitchen today.

## 2024-03-25 ENCOUNTER — Ambulatory Visit: Payer: Self-pay

## 2024-03-25 ENCOUNTER — Encounter: Payer: Self-pay | Admitting: Podiatry

## 2024-03-25 ENCOUNTER — Ambulatory Visit (INDEPENDENT_AMBULATORY_CARE_PROVIDER_SITE_OTHER): Admitting: Podiatry

## 2024-03-25 DIAGNOSIS — M778 Other enthesopathies, not elsewhere classified: Secondary | ICD-10-CM | POA: Diagnosis not present

## 2024-03-25 DIAGNOSIS — M7751 Other enthesopathy of right foot: Secondary | ICD-10-CM

## 2024-03-25 DIAGNOSIS — D492 Neoplasm of unspecified behavior of bone, soft tissue, and skin: Secondary | ICD-10-CM

## 2024-03-25 MED ORDER — METHYLPREDNISOLONE 4 MG PO TBPK
ORAL_TABLET | ORAL | 0 refills | Status: DC
Start: 2024-03-25 — End: 2024-05-07

## 2024-03-25 NOTE — Telephone Encounter (Signed)
 Patient called, no answer, mailbox not set up.    Summary: right leg concern   The patient has called looking for an orthopedic specialist that would be able to see them today for concerns in their right foot and lower leg. The patient shares that they have had an ongoing history of concerns that need to be addressed further by specialists at the earliest ability. Please contact when possible

## 2024-03-25 NOTE — Patient Instructions (Signed)
 Methylprednisolone Tablets What is this medication? METHYLPREDNISOLONE (meth ill pred NISS oh lone) treats many conditions such as asthma, allergic reactions, arthritis, inflammatory bowel diseases, adrenal, and blood or bone marrow disorders. It works by decreasing inflammation, slowing down an overactive immune system, or replacing cortisol normally made in the body. Cortisol is a hormone that plays an important role in how the body responds to stress, illness, and injury. It belongs to a group of medications called steroids. This medicine may be used for other purposes; ask your health care provider or pharmacist if you have questions. COMMON BRAND NAME(S): Medrol, Medrol Dosepak What should I tell my care team before I take this medication? They need to know if you have any of these conditions: Cushing's syndrome Eye disease, vision problems Diabetes Glaucoma Heart disease High blood pressure Infection especially a viral infection, such as chickenpox, cold sores, or herpes Liver disease Mental health conditions Myasthenia gravis Osteoporosis Recent or upcoming vaccine Seizures Stomach or intestine problems Thyroid disease An unusual or allergic reaction to lactose, methylprednisolone, other medications, foods, dyes, or preservatives Pregnant or trying to get pregnant Breastfeeding How should I use this medication? Take this medication by mouth with a glass of water. Follow the directions on the prescription label. Take this medication with food. If you are taking this medication once a day, take it in the morning. Do not take it more often than directed. Do not suddenly stop taking your medication because you may develop a severe reaction. Your care team will tell you how much medication to take. If your care team wants you to stop the medication, the dose may be slowly lowered over time to avoid any side effects. Talk to your care team about the use of this medication in children.  Special care may be needed. Overdosage: If you think you have taken too much of this medicine contact a poison control center or emergency room at once. NOTE: This medicine is only for you. Do not share this medicine with others. What if I miss a dose? If you miss a dose, take it as soon as you can. If it is almost time for your next dose, talk to your care team. You may need to miss a dose or take an extra dose. Do not take double or extra doses without advice. What may interact with this medication? Do not take this medication with any of the following: Alefacept Echinacea Live virus vaccines Metyrapone Mifepristone This medication may also interact with the following: Amphotericin B Aspirin and aspirin-like medications Certain antibiotics, such as erythromycin, clarithromycin, troleandomycin Certain medications for diabetes Certain medications for fungal infections, such as ketoconazole Certain medications for seizures, such as carbamazepine, phenobarbital, phenytoin Certain medications that treat or prevent blood clots, such as warfarin Cholestyramine Cyclosporine Digoxin Diuretics Estrogen or progestin hormones Isoniazid NSAIDs, medications for pain and inflammation, such as ibuprofen or naproxen Other medications for myasthenia gravis Rifampin Vaccines This list may not describe all possible interactions. Give your health care provider a list of all the medicines, herbs, non-prescription drugs, or dietary supplements you use. Also tell them if you smoke, drink alcohol, or use illegal drugs. Some items may interact with your medicine. What should I watch for while using this medication? Tell your care team if your symptoms do not start to get better or if they get worse. Do not stop taking except on your care team's advice. You may develop a severe reaction. Your care team will tell you how much medication to  take. This medication may increase your risk of getting an infection.  Tell your care team if you are around anyone with measles or chickenpox, or if you develop sores or blisters that do not heal properly. This medication may increase blood sugar levels. Ask your care team if changes in diet or medications are needed if you have diabetes. Tell your care team right away if you have any change in your eyesight. Using this medication for a long time may increase your risk of low bone mass. Talk to your care team about bone health. What side effects may I notice from receiving this medication? Side effects that you should report to your care team as soon as possible: Allergic reactions--skin rash, itching, hives, swelling of the face, lips, tongue, or throat Cushing syndrome--increased fat around the midsection, upper back, neck, or face, pink or purple stretch marks on the skin, thinning, fragile skin that easily bruises, unexpected hair growth High blood sugar (hyperglycemia)--increased thirst or amount of urine, unusual weakness or fatigue, blurry vision Increase in blood pressure Infection--fever, chills, cough, sore throat, wounds that don't heal, pain or trouble when passing urine, general feeling of discomfort or being unwell Low adrenal gland function--nausea, vomiting, loss of appetite, unusual weakness or fatigue, dizziness Mood and behavior changes--anxiety, nervousness, confusion, hallucinations, irritability, hostility, thoughts of suicide or self-harm, worsening mood, feelings of depression Stomach bleeding--bloody or black, tar-like stools, vomiting blood or brown material that looks like coffee grounds Swelling of the ankles, hands, or feet Side effects that usually do not require medical attention (report to your care team if they continue or are bothersome): Acne General discomfort and fatigue Headache Increase in appetite Nausea Trouble sleeping Weight gain This list may not describe all possible side effects. Call your doctor for medical advice  about side effects. You may report side effects to FDA at 1-800-FDA-1088. Where should I keep my medication? Keep out of the reach of children and pets. Store at room temperature between 20 and 25 degrees C (68 and 77 degrees F). Throw away any unused medication after the expiration date. NOTE: This sheet is a summary. It may not cover all possible information. If you have questions about this medicine, talk to your doctor, pharmacist, or health care provider.  2024 Elsevier/Gold Standard (2022-03-16 00:00:00)

## 2024-03-25 NOTE — Telephone Encounter (Addendum)
 This RN contacted patient. Patient answered and stated she was driving and cannot talk at this time. Patient states her Dr. Donnice Dunn with Podiatry is calling her in a steroid and a brace. Patient will follow up with us  and Podiatrist.   Patient states she still would like a call today after 1:30 Pm to discuss a referral to an orthopaedic doctor.

## 2024-03-27 NOTE — Progress Notes (Signed)
 Subjective: Chief Complaint  Patient presents with   Foot Pain    Pt stated that she is still having a lot of pain with her ankle      73 year old female presents the office today with multiple concerns.  Her primary concern today is the ankle pain in the right ankle.  She did have a over-the-counter brace that she would like me to show her how to put on.  She took the anti-inflammatory which was not helping the joint urgent care was given Celebrex  which she feels is not helping as well.  She was given short course of tramadol .  She does not recall any injuries to her ankle no recent swelling to the ankle itself.  She does get some swelling to the left foot and the fluid retention.  No injuries to the left side either.   She does not want to proceed with the mass removal until the ankle pain is resolved.  Objective: AAO x3, NAD DP/PT pulses palpable bilaterally, CRT less than 3 seconds On the right ankle tender able to appreciate any area pinpoint tenderness particular in the distal fibula is no pinpoint tenderness noted.  There is no pain with ankle range of motion.  Some discomfort along the course the peroneal tendon.  There is no erythema or warmth.  There are some mild edema present to left foot but no pinpoint tenderness. No pain with calf compression, swelling, warmth, erythema  Assessment: Ongoing right ankle pain, capsulitis; left foot swelling   Plan:  Skin growth -We can discuss surgical excision of the lesion.  Discussed the procedure again today but will defer until after the foot, ankle pain is resolved.  Ankle pain - I think her symptoms are multifactorial.  ABI within normal limits.  This could be component of nerve related issues versus musculoskeletal issues.  I would like to immobilize this.  Discussed cam boot but she was not able to tolerate this.  She has an over-the-counter brace which I showed her how to put on today.  Prescribed Medrol  Dosepak.  We discussed  treatment again but like to see how she does with the steroids.  She is not having any improvement would recommend MRI.  Left foot swelling - Medrol  Dosepak.   Brenda Dunn DPM

## 2024-03-28 ENCOUNTER — Other Ambulatory Visit: Payer: Self-pay | Admitting: Family Medicine

## 2024-04-01 ENCOUNTER — Telehealth: Payer: Self-pay | Admitting: Physician Assistant

## 2024-04-01 DIAGNOSIS — F411 Generalized anxiety disorder: Secondary | ICD-10-CM

## 2024-04-01 DIAGNOSIS — F5101 Primary insomnia: Secondary | ICD-10-CM

## 2024-04-02 ENCOUNTER — Encounter: Payer: Self-pay | Admitting: Sports Medicine

## 2024-04-02 ENCOUNTER — Encounter: Payer: Self-pay | Admitting: Podiatry

## 2024-04-02 NOTE — Telephone Encounter (Signed)
 LF 8/9, due 9/7

## 2024-04-02 NOTE — Telephone Encounter (Signed)
 Pt called to ask if teresa could expediate the valium  refill since she is trying to  quit smoking. Her anxiety is through the roof

## 2024-04-02 NOTE — Telephone Encounter (Signed)
 Patient called with refill request for Diazepam . States that pharmacy informed her she needed authorization to pick up today as its not due until 9/4. Pls rtc [phone] appt 10/8. Pharmacy [facility] [address] [place], [place]

## 2024-04-02 NOTE — Telephone Encounter (Signed)
 Pt reports trying to quit smoking and her anxiety is high. She has been taking more Valium  than prescribed. LF was 8/9. She said pharmacy told her they could fill 9/5, but based on usual RF times is due 9/7.  Pharmacist reports based on instructions and qty fill is for 26 days. Rx does not mention PRN and no comment that it must last 30 days. She has a RF available.

## 2024-04-03 ENCOUNTER — Other Ambulatory Visit: Payer: Self-pay | Admitting: Podiatry

## 2024-04-03 ENCOUNTER — Other Ambulatory Visit: Payer: Self-pay | Admitting: Physician Assistant

## 2024-04-03 DIAGNOSIS — M7751 Other enthesopathy of right foot: Secondary | ICD-10-CM

## 2024-04-03 NOTE — Telephone Encounter (Signed)
 I was on the phone with the patient when message came thru and it was reviewed with her.

## 2024-04-03 NOTE — Telephone Encounter (Signed)
 She's been getting it early every time since March, ( for the record 3/4, 4/ 31, 4/ 28, 5/23, 6/19, 7/17, 7/17, 8/9.  This last time was 2 weeks early.) No early RF and you're right, not due till 9/7.  Let her know if she continues to overtake, it can be grounds for dismissal. Thanks.

## 2024-04-04 ENCOUNTER — Telehealth: Payer: Self-pay | Admitting: Lab

## 2024-04-04 NOTE — Telephone Encounter (Signed)
 Spoke to patient discussed with her that we are getting her messages and the provider has 24 hrs to respond to her messages and explained he was in surgery yesterday.

## 2024-04-05 ENCOUNTER — Telehealth: Payer: Self-pay | Admitting: Podiatry

## 2024-04-05 NOTE — Telephone Encounter (Signed)
 I misunderstood what the patient stated. Disregard prior message. She states because the tramadol  is a 5 day supply she will need another refill sent on Tuesday. Can you send a 15 day supply or tramadol  on Tuesday ? She mentioned that the results for MRI could take awhile and she takes two a day and it only takes the edge off the pain. She said she has a high tolerance and you are aware because when she had her bunion removed she needed more medication.

## 2024-04-05 NOTE — Telephone Encounter (Signed)
 Error

## 2024-04-05 NOTE — Telephone Encounter (Signed)
 Patient states she will be out of tramadol  byTuesday.She doesn't have any refills.

## 2024-04-07 ENCOUNTER — Other Ambulatory Visit: Payer: Self-pay | Admitting: Podiatry

## 2024-04-07 ENCOUNTER — Ambulatory Visit (INDEPENDENT_AMBULATORY_CARE_PROVIDER_SITE_OTHER)

## 2024-04-07 DIAGNOSIS — M25571 Pain in right ankle and joints of right foot: Secondary | ICD-10-CM

## 2024-04-07 DIAGNOSIS — M7751 Other enthesopathy of right foot: Secondary | ICD-10-CM

## 2024-04-07 MED ORDER — TRAMADOL HCL 50 MG PO TABS: 50.0000 mg | ORAL_TABLET | Freq: Three times a day (TID) | ORAL | 0 refills | Status: AC | PRN

## 2024-04-08 ENCOUNTER — Ambulatory Visit: Payer: Self-pay | Admitting: Podiatry

## 2024-04-08 ENCOUNTER — Other Ambulatory Visit: Payer: Self-pay | Admitting: Podiatry

## 2024-04-08 DIAGNOSIS — M7751 Other enthesopathy of right foot: Secondary | ICD-10-CM

## 2024-04-12 ENCOUNTER — Ambulatory Visit: Admitting: Podiatry

## 2024-04-13 ENCOUNTER — Other Ambulatory Visit: Payer: Self-pay | Admitting: Family Medicine

## 2024-04-13 DIAGNOSIS — Z634 Disappearance and death of family member: Secondary | ICD-10-CM

## 2024-04-15 ENCOUNTER — Ambulatory Visit: Attending: Sports Medicine | Admitting: Rehabilitative and Restorative Service Providers"

## 2024-04-15 ENCOUNTER — Other Ambulatory Visit: Payer: Self-pay

## 2024-04-15 ENCOUNTER — Encounter: Payer: Self-pay | Admitting: Rehabilitative and Restorative Service Providers"

## 2024-04-15 DIAGNOSIS — M7751 Other enthesopathy of right foot: Secondary | ICD-10-CM | POA: Diagnosis present

## 2024-04-15 DIAGNOSIS — M25571 Pain in right ankle and joints of right foot: Secondary | ICD-10-CM | POA: Diagnosis present

## 2024-04-15 DIAGNOSIS — M6281 Muscle weakness (generalized): Secondary | ICD-10-CM | POA: Insufficient documentation

## 2024-04-15 NOTE — Therapy (Signed)
 OUTPATIENT PHYSICAL THERAPY LOWER EXTREMITY EVALUATION   Patient Name: Brenda Dunn MRN: [id number] DOB:July 05, 1951, 73 y.o., female Today's Date: 04/15/2024  END OF SESSION:  PT End of Session - 04/15/24 1055     Visit Number 1    Number of Visits 16    Date for PT Re-Evaluation 06/14/24    Authorization Type [employer]    PT Start Time 1058    PT Stop Time 1140    PT Time Calculation (min) 42 min    Activity Tolerance Patient tolerated treatment well    Behavior During Therapy WFL for tasks assessed/performed          Past Medical History:  Diagnosis Date   Anxiety    Benzodiazepine dependence (HCC)    Colonic polyp    mulitple polyps. repeat in 05/2019   Constipation    IBS   COPD (chronic obstructive pulmonary disease) (HCC)    Depression    GERD (gastroesophageal reflux disease)    Hyperlipidemia    Hypertension    Insomnia    Migraine headache    OAB (overactive bladder)    Osteoarthritis    Osteoporosis 12/19/2017   Post-surgical hypothyroidism    Pulmonary embolism (HCC)    Eliquis  to stop 05/03/19   Scoliosis    Past Surgical History:  Procedure Laterality Date   ABDOMINAL AORTOGRAM W/LOWER EXTREMITY Bilateral 03/24/2020   Procedure: ABDOMINAL AORTOGRAM W/LOWER EXTREMITY;  Surgeon: Serene Gaile ORN, MD;  Location: [facility];  Service: Cardiovascular;  Laterality: Bilateral;   BREAST CYST ASPIRATION Left    HEMORRHOID SURGERY     PERIPHERAL VASCULAR INTERVENTION Bilateral 03/24/2020   Procedure: PERIPHERAL VASCULAR INTERVENTION;  Surgeon: Serene Gaile ORN, MD;  Location: [facility];  Service: Cardiovascular;  Laterality: Bilateral;  ILIAC STENTS   THYROIDECTOMY     at [facility]   TUBAL LIGATION  1979   Patient Active Problem List   Diagnosis Date Noted   Vitamin D  deficiency 06/16/2021   Facet arthritis of lumbosacral region 04/27/2021   Benign essential tremor 03/23/2021   Bereavement 03/23/2021   Bilateral iliac artery  stenosis (HCC) 02/16/2021   Thrombocytopenia (HCC) 02/16/2021   Obesity (BMI 30-39.9) 02/17/2020   Lumbar spondylosis 02/17/2020   History of pulmonary embolus (PE) 12/06/2019   Osteopenia of lumbar spine 12/06/2019   Chronic throat clearing 06/18/2018   Bunion 05/04/2018   Severe mood disorder without psychotic features (HCC) 04/16/2018   Osteoporosis 12/19/2017   Seasonal allergies 11/24/2017   Solitary pulmonary nodule on lung CT 06/02/2017   Tobacco abuse 05/02/2017   Essential hypertension    Gastroesophageal reflux disease    Postoperative hypothyroidism    Hyperlipidemia    Osteoarthritis    Depression    Chronic constipation    Scoliosis    Migraine headache    Insomnia    OAB (overactive bladder)    Colonic polyp    History of colonic polyps 02/06/2017   GAD (generalized anxiety disorder) 12/07/2016   RLS (restless legs syndrome) 12/07/2016    PCP: Mabel Pry, DO  REFERRING PROVIDER: Donnice Fees, DPM  REFERRING DIAG:  Diagnosis  M77.51 (ICD-10-CM) - Right ankle tendonitis    THERAPY DIAG:  Pain in right ankle and joints of right foot  Muscle weakness (generalized)  Rationale for Evaluation and Treatment: Rehabilitation  ONSET DATE: 04/08/24  SUBJECTIVE:   SUBJECTIVE STATEMENT: The patient began with R lateral ankle pain and distal LE pain in late July. She wonders if  the heel/toe exercises in therapy that we provided for her L hip may have flared up the ankle pain. She is able to do minimal daily activities in the house, but has a hard time running errands. She is elevating the foot 3-4 times/day. She has not returned to household activities that involve sweeping, or standing for longer periods. She felt the ankle brace made her pain worse. The pain has improved it was a stabbing sensation and now it is an achy feeling.  She does continue with mild tightness in her L hip (from sleeping on the L side).   PERTINENT HISTORY: HTN, hyperlipidemia,  COPD, h/o pulmonary embolism, R foot (appendage on L 2nd toe)  PAIN:  Are you having pain? Yes: NPRS scale: goes up to 8/10, currently not hurting (was aching this morning but took tylenol ) Pain location: R lateral ankle and distal lateral leg Pain description: aching Aggravating factors: walking, being on your feet, sweeping Relieving factors: medication has run out (now down to tylenol ), elevation  PRECAUTIONS: None  WEIGHT BEARING RESTRICTIONS: No  FALLS:  Has patient fallen in last 6 months? No  LIVING ENVIRONMENT: Lives with: lives with their family and lives alone Lives in: House/apartment Stairs: No Has following equipment at home: None  OCCUPATION: retired  PLOF: Independent  PATIENT GOALS: reduce pain   OBJECTIVE:  Note: Objective measures were completed at Evaluation unless otherwise noted.  DIAGNOSTIC FINDINGS:  MRI IMPRESSION: Unremarkable MRI of the right ankle. No acute findings or explanation for the patient's symptoms.  X ray on 01/31/2024 of low back: Moderate left L2-3 and L3-4 disc space narrowing and bridging osteophytes. Severe anterior and posterior T10-11, moderate T11-12, mild T12-L1, moderate L1-2, and moderate L4-5 and L5-S1 disc space narrowing.   Facet joint arthropathy is greatest within the lower lumbar spine. High-grade anterior T11 endplate sclerosis, degenerative.   Moderate to high-grade atherosclerotic calcifications. Bilateral common iliac stent grafts are again noted.   IMPRESSION: 1. Mild dextrocurvature centered at L3-4, similar to prior. 2. Moderate to severe multilevel degenerative disc, endplate, and joint changes, similar to prior.  PATIENT SURVEYS:  LEFS =48.8%  COGNITION: Overall cognitive status: Within functional limits for tasks assessed     SENSATION: WFL  EDEMA:  None  PALPATION: No tenderness to ankle palpation   LOWER EXTREMITY ROM: Active ROM Right eval Left eval  Hip flexion    Hip extension     Hip abduction    Hip adduction    Hip internal rotation    Hip external rotation    Knee flexion    Knee extension    Ankle dorsiflexion -3 degrees from neutral -3 degrees from neutral  Ankle plantarflexion    Ankle inversion    Ankle eversion     (Blank rows = not tested)  LOWER EXTREMITY MMT: MMT Right eval Left eval  Hip flexion 4/5 4/5  Hip extension    Hip abduction    Hip adduction    Hip internal rotation    Hip external rotation    Knee flexion 5/5 5/5  Knee extension 5/5 5/5  Ankle dorsiflexion 5/5 5/5  Ankle plantarflexion Deferred-- she does not want to do standing heel raises   Ankle inversion    Ankle eversion     (Blank rows = not tested)  LOWER EXTREMITY SPECIAL TESTS:  Ankle special tests: Anterior drawer test: negative  FUNCTIONAL TESTS:  Single leg stance=2 seconds R and L  GAIT: Distance walked: 100 ft Assistive device utilized:  None Level of assistance: Complete Independence Comments: ER of hips leading to toeing out    [facility] Adult PT Treatment:                                                DATE: 04/15/24 Therapeutic Exercise: Standing Heel cord stretch Seated Ankle circles Piriformis stretch Supine Piriformis stretch Self Care: Discussed prior HEP-- she notes heel raises may have flared. We did that 7/22 in clinic, but not provided for HEP. Self massage glut on R side  PATIENT EDUCATION:  Education details: HEP Person educated: Patient Education method: Explanation, Demonstration, and Handouts Education comprehension: verbalized understanding and returned demonstration  HOME EXERCISE PROGRAM: Access Code: [email or web address] URL: [email or web address] Date: 04/15/2024 Prepared by: Tawni Ferrier  Exercises - Bent Knee Fallouts  - 1 x daily - 5 x weekly - 1 sets - 10 reps - Seated Piriformis Stretch with Trunk Bend  - 2 x daily - 7 x weekly - 1 sets - 3 reps - 30-60 sec  hold - Single Leg Stance with Support  - 1 x daily  - 5 x weekly - 1 sets - 3 reps - 10 seconds hold - Gastroc Stretch on Wall  - 1 x daily - 5 x weekly - 1 sets - 3 reps - 20-30 seconds hold  ASSESSMENT:  CLINICAL IMPRESSION: Patient is a 73 y.o. female who was seen today for physical therapy evaluation and treatment for R ankle pain. At today's evaluation, her ROM is limited bilat for ankle DF. She declines to demo a heel raise due to fear this flared pain. The patient presents with tightness/pain with palpation of the R sacral region, piriformis-- this does not reproduce ankle pain.Patient has dec'd single limb stance, general hip weakness that PT will also address. At end of session, she does note mild ankle achiness. PT to address deficits to promote return to household chores and community ambulation.  OBJECTIVE IMPAIRMENTS: decreased activity tolerance, decreased balance, decreased ROM, increased fascial restrictions, and pain.   ACTIVITY LIMITATIONS: locomotion level  PARTICIPATION LIMITATIONS: cleaning and community activity  PERSONAL FACTORS: 1-2 comorbidities: HTN, COPD are also affecting patient's functional outcome.   REHAB POTENTIAL: Good  CLINICAL DECISION MAKING: Evolving/moderate complexity  EVALUATION COMPLEXITY: Moderate   GOALS: Goals reviewed with patient? Yes  SHORT TERM GOALS: Target date: 05/14/24  The patient will be indep with initial HEP Baseline: initiated at eval Goal status: INITIAL  2.  The patient will report pain at worst in past week 4/10.  Baseline:  8/10 Goal status: INITIAL  LONG TERM GOALS: Target date: 06/14/24  The patient will be indep with progression of HEP. Baseline:  initiated at eval Goal status: INITIAL  2.  The patient will improve LEFS by 15%  to demonstrate improved functional abilities. Baseline: 48.8% Goal status: INITIAL  3.  The patient will improve bilat ankle DF to > or equal to 4 degrees DF Baseline:  -3 bilat (from neutral 90) Goal status: INITIAL  4.  The  patient will report return to household chores including sweeping, and walking.  Baseline:  Unable Goal status: INITIAL  PLAN:  PT FREQUENCY: 1x/week  PT DURATION: 8 weeks  PLANNED INTERVENTIONS: 97164- PT Re-evaluation, 97750- Physical Performance Testing, 97110-Therapeutic exercises, 97530- Therapeutic activity, [id number]- Neuromuscular re-education, 97535- Self Care, [id number]- Manual therapy, [phone]- Gait training, Patient/Family education, Balance training,  Stair training, Joint mobilization, Cryotherapy, and Moist heat  PLAN FOR NEXT SESSION: check HEP, progress LE strength and stretching to tolerance.    Alys Dulak, PT 04/15/2024, 3:38 PM

## 2024-04-17 ENCOUNTER — Ambulatory Visit

## 2024-04-17 NOTE — Therapy (Incomplete)
 OUTPATIENT PHYSICAL THERAPY LOWER EXTREMITY TREATMENT   Patient Name: Brenda Dunn MRN: [id number] DOB:[birth year], 73 y.o., female Today's Date: 04/17/2024  END OF SESSION:    Past Medical History:  Diagnosis Date   Anxiety    Benzodiazepine dependence (HCC)    Colonic polyp    mulitple polyps. repeat in 05/2019   Constipation    IBS   COPD (chronic obstructive pulmonary disease) (HCC)    Depression    GERD (gastroesophageal reflux disease)    Hyperlipidemia    Hypertension    Insomnia    Migraine headache    OAB (overactive bladder)    Osteoarthritis    Osteoporosis 12/19/2017   Post-surgical hypothyroidism    Pulmonary embolism (HCC)    Eliquis  to stop 05/03/19   Scoliosis    Past Surgical History:  Procedure Laterality Date   ABDOMINAL AORTOGRAM W/LOWER EXTREMITY Bilateral 03/24/2020   Procedure: ABDOMINAL AORTOGRAM W/LOWER EXTREMITY;  Surgeon: Serene Gaile ORN, MD;  Location: [facility];  Service: Cardiovascular;  Laterality: Bilateral;   BREAST CYST ASPIRATION Left    HEMORRHOID SURGERY     PERIPHERAL VASCULAR INTERVENTION Bilateral 03/24/2020   Procedure: PERIPHERAL VASCULAR INTERVENTION;  Surgeon: Serene Gaile ORN, MD;  Location: [facility];  Service: Cardiovascular;  Laterality: Bilateral;  ILIAC STENTS   [facility]ROIDECTOMY     at [facility]   TUBAL LIGATION  1979   Patient Active Problem List   Diagnosis Date Noted   Vitamin D  deficiency 06/16/2021   Facet arthritis of lumbosacral region 04/27/2021   Benign essential tremor 03/23/2021   Bereavement 03/23/2021   Bilateral iliac artery stenosis (HCC) 02/16/2021   Thrombocytopenia (HCC) 02/16/2021   Obesity (BMI 30-39.9) 02/17/2020   Lumbar spondylosis 02/17/2020   History of pulmonary embolus (PE) 12/06/2019   Osteopenia of lumbar spine 12/06/2019   Chronic throat clearing 06/18/2018   Bunion 05/04/2018   Severe mood disorder without psychotic features (HCC) 04/16/2018   Osteoporosis 12/19/2017    Seasonal allergies 11/24/2017   Solitary pulmonary nodule on lung CT 06/02/2017   Tobacco abuse 05/02/2017   Essential hypertension    Gastroesophageal reflux disease    Postoperative hypothyroidism    Hyperlipidemia    Osteoarthritis    Depression    Chronic constipation    Scoliosis    Migraine headache    Insomnia    OAB (overactive bladder)    Colonic polyp    History of colonic polyps 02/06/2017   GAD (generalized anxiety disorder) 12/07/2016   RLS (restless legs syndrome) 12/07/2016    PCP: Mabel Pry, DO  REFERRING PROVIDER: Donnice Fees, DPM  REFERRING DIAG:  Diagnosis  M77.51 (ICD-10-CM) - Right ankle tendonitis    THERAPY DIAG:  No diagnosis found.  Rationale for Evaluation and Treatment: Rehabilitation  ONSET DATE: 04/08/24  SUBJECTIVE:   SUBJECTIVE STATEMENT: The patient began with R lateral ankle pain and distal LE pain in late July. She wonders if the heel/toe exercises in therapy that we provided for her L hip may have flared up the ankle pain. She is able to do minimal daily activities in the house, but has a hard time running errands. She is elevating the foot 3-4 times/day. She has not returned to household activities that involve sweeping, or standing for longer periods. She felt the ankle brace made her pain worse. The pain has improved it was a stabbing sensation and now it is an achy feeling.  She does continue with mild tightness in her L hip (  from sleeping on the L side).   PERTINENT HISTORY: HTN, hyperlipidemia, COPD, h/o pulmonary embolism, R foot (appendage on L 2nd toe)  PAIN:  Are you having pain? Yes: NPRS scale: goes up to 8/10, currently not hurting (was aching this morning but took tylenol ) Pain location: R lateral ankle and distal lateral leg Pain description: aching Aggravating factors: walking, being on your feet, sweeping Relieving factors: medication has run out (now down to tylenol ), elevation  PRECAUTIONS:  None  WEIGHT BEARING RESTRICTIONS: No  FALLS:  Has patient fallen in last 6 months? No  LIVING ENVIRONMENT: Lives with: lives with their family and lives alone Lives in: House/apartment Stairs: No Has following equipment at home: None  OCCUPATION: retired  PLOF: Independent  PATIENT GOALS: reduce pain   OBJECTIVE:  Note: Objective measures were completed at Evaluation unless otherwise noted.  DIAGNOSTIC FINDINGS:  MRI IMPRESSION: Unremarkable MRI of the right ankle. No acute findings or explanation for the patient's symptoms.  X ray on 01/31/2024 of low back: Moderate left L2-3 and L3-4 disc space narrowing and bridging osteophytes. Severe anterior and posterior T10-11, moderate T11-12, mild T12-L1, moderate L1-2, and moderate L4-5 and L5-S1 disc space narrowing.   Facet joint arthropathy is greatest within the lower lumbar spine. High-grade anterior T11 endplate sclerosis, degenerative.   Moderate to high-grade atherosclerotic calcifications. Bilateral common iliac stent grafts are again noted.   IMPRESSION: 1. Mild dextrocurvature centered at L3-4, similar to prior. 2. Moderate to severe multilevel degenerative disc, endplate, and joint changes, similar to prior.  PATIENT SURVEYS:  LEFS =48.8%  COGNITION: Overall cognitive status: Within functional limits for tasks assessed     SENSATION: WFL  EDEMA:  None  PALPATION: No tenderness to ankle palpation   LOWER EXTREMITY ROM: Active ROM Right eval Left eval  Hip flexion    Hip extension    Hip abduction    Hip adduction    Hip internal rotation    Hip external rotation    Knee flexion    Knee extension    Ankle dorsiflexion -3 degrees from neutral -3 degrees from neutral  Ankle plantarflexion    Ankle inversion    Ankle eversion     (Blank rows = not tested)  LOWER EXTREMITY MMT: MMT Right eval Left eval  Hip flexion 4/5 4/5  Hip extension    Hip abduction    Hip adduction    Hip  internal rotation    Hip external rotation    Knee flexion 5/5 5/5  Knee extension 5/5 5/5  Ankle dorsiflexion 5/5 5/5  Ankle plantarflexion Deferred-- she does not want to do standing heel raises   Ankle inversion    Ankle eversion     (Blank rows = not tested)  LOWER EXTREMITY SPECIAL TESTS:  Ankle special tests: Anterior drawer test: negative  FUNCTIONAL TESTS:  Single leg stance=2 seconds R and L  GAIT: Distance walked: 100 ft Assistive device utilized: None Level of assistance: Complete Independence Comments: ER of hips leading to toeing out    [facility] Adult PT Treatment:                                                DATE: 04/15/24 Therapeutic Exercise: Standing Heel cord stretch Seated Ankle circles Piriformis stretch Supine Piriformis stretch Self Care: Discussed prior HEP-- she notes heel raises may have  flared. We did that 7/22 in clinic, but not provided for HEP. Self massage glut on R side  PATIENT EDUCATION:  Education details: HEP Person educated: Patient Education method: Explanation, Demonstration, and Handouts Education comprehension: verbalized understanding and returned demonstration  HOME EXERCISE PROGRAM: Access Code: [email or web address] URL: [email or web address] Date: 04/15/2024 Prepared by: Tawni Ferrier  Exercises - Bent Knee Fallouts  - 1 x daily - 5 x weekly - 1 sets - 10 reps - Seated Piriformis Stretch with Trunk Bend  - 2 x daily - 7 x weekly - 1 sets - 3 reps - 30-60 sec  hold - Single Leg Stance with Support  - 1 x daily - 5 x weekly - 1 sets - 3 reps - 10 seconds hold - Gastroc Stretch on Wall  - 1 x daily - 5 x weekly - 1 sets - 3 reps - 20-30 seconds hold  ASSESSMENT:  CLINICAL IMPRESSION: Patient is a 73 y.o. female who was seen today for physical therapy evaluation and treatment for R ankle pain. At today's evaluation, her ROM is limited bilat for ankle DF. She declines to demo a heel raise due to fear this flared pain.  The patient presents with tightness/pain with palpation of the R sacral region, piriformis-- this does not reproduce ankle pain.Patient has dec'd single limb stance, general hip weakness that PT will also address. At end of session, she does note mild ankle achiness. PT to address deficits to promote return to household chores and community ambulation.  OBJECTIVE IMPAIRMENTS: decreased activity tolerance, decreased balance, decreased ROM, increased fascial restrictions, and pain.   ACTIVITY LIMITATIONS: locomotion level  PARTICIPATION LIMITATIONS: cleaning and community activity  PERSONAL FACTORS: 1-2 comorbidities: HTN, COPD are also affecting patient's functional outcome.   REHAB POTENTIAL: Good  CLINICAL DECISION MAKING: Evolving/moderate complexity  EVALUATION COMPLEXITY: Moderate   GOALS: Goals reviewed with patient? Yes  SHORT TERM GOALS: Target date: 05/14/24  The patient will be indep with initial HEP Baseline: initiated at eval Goal status: INITIAL  2.  The patient will report pain at worst in past week 4/10.  Baseline:  8/10 Goal status: INITIAL  LONG TERM GOALS: Target date: 06/14/24  The patient will be indep with progression of HEP. Baseline:  initiated at eval Goal status: INITIAL  2.  The patient will improve LEFS by 15%  to demonstrate improved functional abilities. Baseline: 48.8% Goal status: INITIAL  3.  The patient will improve bilat ankle DF to > or equal to 4 degrees DF Baseline:  -3 bilat (from neutral 90) Goal status: INITIAL  4.  The patient will report return to household chores including sweeping, and walking.  Baseline:  Unable Goal status: INITIAL  PLAN:  PT FREQUENCY: 1x/week  PT DURATION: 8 weeks  PLANNED INTERVENTIONS: 97164- PT Re-evaluation, 97750- Physical Performance Testing, 97110-Therapeutic exercises, 97530- Therapeutic activity, [id number]- Neuromuscular re-education, 97535- Self Care, 02859- Manual therapy, [phone]- Gait  training, Patient/Family education, Balance training, Stair training, Joint mobilization, Cryotherapy, and Moist heat  PLAN FOR NEXT SESSION: check HEP, progress LE strength and stretching to tolerance.   Zeanna Sunde, PT, DPT, ATC 04/17/24 7:09 AM

## 2024-04-24 ENCOUNTER — Other Ambulatory Visit: Payer: Self-pay | Admitting: Family Medicine

## 2024-04-25 ENCOUNTER — Encounter: Payer: Self-pay | Admitting: Physician Assistant

## 2024-04-25 ENCOUNTER — Ambulatory Visit: Admitting: Physician Assistant

## 2024-04-25 DIAGNOSIS — F17210 Nicotine dependence, cigarettes, uncomplicated: Secondary | ICD-10-CM

## 2024-04-25 DIAGNOSIS — Z720 Tobacco use: Secondary | ICD-10-CM

## 2024-04-25 DIAGNOSIS — F411 Generalized anxiety disorder: Secondary | ICD-10-CM

## 2024-04-25 DIAGNOSIS — F5101 Primary insomnia: Secondary | ICD-10-CM | POA: Diagnosis not present

## 2024-04-25 MED ORDER — DIAZEPAM 5 MG PO TABS: 5.0000 mg | ORAL_TABLET | Freq: Three times a day (TID) | ORAL | 5 refills | Status: AC

## 2024-04-25 NOTE — Progress Notes (Addendum)
 [facility]  Patient ID: Brenda Dunn,  MRN: [id number]  PCP: Frann Mabel Mt, DO  Date of Evaluation: 04/25/2024 Time spent:30 minutes  Chief Complaint:  Chief Complaint   Anxiety; Follow-up   Virtual Visit via Telehealth  I connected with patient by  telephone, with their informed consent, and verified patient privacy and that I am speaking with the correct person using two identifiers.  I am private, in my office and the patient is at home.  I discussed the limitations, risks, security and privacy concerns of performing an evaluation and management service by telephone (she has not access to computer) and the availability of in person appointments. I also discussed with the patient that there may be a patient responsible charge related to this service. The patient expressed understanding and agreed to proceed.   I discussed the assessment and treatment plan with the patient. The patient was provided an opportunity to ask questions and all were answered. The patient agreed with the plan and demonstrated an understanding of the instructions.   The patient was advised to call back or seek an in-person evaluation if the symptoms worsen or if the condition fails to improve as anticipated.  I provided approximately  30  minutes of non-face-to-face time during this encounter.  HISTORY/CURRENT STATUS: HPI   for routine follow-up.  Brenda Dunn is doing well. Valium  helps the anxiety, her 'nerves get bad' if she doesn't take it. Is mostly overwhelmed, no PA. In the distant past, she's tried antidep to help prevent the anxiety but she doesn't remember which ones, or the effect except 'they didn't help.' She's been on the Valium  alone for years. No falls or confusion.  Energy and motivation are good.   No extreme sadness, tearfulness, or feelings of hopelessness.  Sleeps well most of the time. ADLs and personal hygiene are normal.   No change in making decisions, or remembering  things.  Appetite has not changed.  Weight is stable.  Continues to smoke and has no desire to quit.   No mania, delirium, AH/VH.  No SI/HI.  Individual Medical History/ Review of Systems: Changes? :No   Past Psychiatric Medication Trials: Remeron - Had worsening depression and insomnia with 30 mg dose. Olanzapine Seroquel  Risperdal -helpful for mood, anxiety, and insomnia Diazepam  Gabapentin  Trazodone  Trileptal - hyponatremia Ambien   Allergies: Patient has no known allergies.  Current Medications:  Current Outpatient Medications:    acetaminophen  (TYLENOL ) 500 MG tablet, Take 1,000 mg by mouth every 6 (six) hours as needed for moderate pain., Disp: , Rfl:    ADVAIR HFA 115-21 MCG/ACT inhaler, INHALE 2 PUFFS INTO THE LUNGS 2 (TWO) TIMES DAILY. FILL FOR EITHER ADVAIR HFA OR GENERIC HFA - DEPENDING ON LOWEST COST WITH HER HTA PLAN. (PATIENT IS REQUESTING MDI AND NOT DPI), Disp: 12 each, Rfl: 1   atorvastatin  (LIPITOR) 40 MG tablet, TAKE 1 TABLET BY MOUTH EVERYDAY AT BEDTIME, Disp: 90 tablet, Rfl: 1   docusate sodium  (COLACE) 100 MG capsule, Take 100-200 mg by mouth at bedtime. Patient taking 4-6 capsules daily, Disp: , Rfl:    fenofibrate  (TRICOR ) 145 MG tablet, Take 1 tablet (145 mg total) by mouth daily., Disp: 90 tablet, Rfl: 3   fluticasone -salmeterol (ADVAIR HFA) 45-21 MCG/ACT inhaler, Inhale 2 puffs into the lungs 2 (two) times daily., Disp: 1 each, Rfl: 12   hydrocortisone  (ANUSOL -HC) 2.5 % rectal cream, Place 1 Application rectally 2 (two) times daily., Disp: 30 g, Rfl: 1   lactulose  (CHRONULAC ) 10 GM/15ML solution, TAKE  15 MLS (10 G TOTAL) BY MOUTH 2 (TWO) TIMES DAILY., Disp: 270 mL, Rfl: 1   levothyroxine  (SYNTHROID ) 100 MCG tablet, TAKE 1 TABLET BY MOUTH EVERY DAY, Disp: 90 tablet, Rfl: 1   lidocaine  (XYLOCAINE ) 2 % solution, Gargle 5-10 mL every 4 hours as needed for throat pain. Do not swallow., Disp: 200 mL, Rfl: 0   linaclotide  (LINZESS ) 290 MCG CAPS capsule, Take 1 capsule  (290 mcg total) by mouth daily before breakfast., Disp: 30 capsule, Rfl: 2   lisinopril  (ZESTRIL ) 40 MG tablet, Take 1 tablet (40 mg total) by mouth daily., Disp: 90 tablet, Rfl: 1   mirabegron  ER (MYRBETRIQ ) 50 MG TB24 tablet, Take 50 mg by mouth daily., Disp: , Rfl:    prazosin  (MINIPRESS ) 5 MG capsule, TAKE 2 CAPSULES (10 MG TOTAL) BY MOUTH AT BEDTIME., Disp: 180 capsule, Rfl: 2   albuterol  (VENTOLIN  HFA) 108 (90 Base) MCG/ACT inhaler, Inhale 1-2 puffs into the lungs every 4 (four) hours as needed for wheezing or shortness of breath., Disp: 36 g, Rfl: 1   diazepam  (VALIUM ) 5 MG tablet, Take 1 tablet (5 mg total) by mouth in the morning, at noon, and at bedtime. May take another 5 mg (=10 mg) at bedtime occas for sleep. Must last 30 days, Disp: 105 tablet, Rfl: 5   methylPREDNISolone  (MEDROL  DOSEPAK) 4 MG TBPK tablet, Take as directed, Disp: 21 tablet, Rfl: 0   mupirocin  ointment (BACTROBAN ) 2 %, Apply 1 Application topically 2 (two) times daily., Disp: 30 g, Rfl: 2 Medication Side Effects: none  Family Medical/ Social History: Changes? No  MENTAL HEALTH EXAM:  There were no vitals taken for this visit.There is no height or weight on file to calculate BMI.  General Appearance: unable to assess  Eye Contact:  unable to assess  Speech:  Normal Rate and quivery voice  Volume:  Normal  Mood:  Euthymic  Affect:  unable to assess  Thought Process:  Goal Directed and Descriptions of Associations: Circumstantial  Orientation:  Full (Time, Place, and Person)  Thought Content: Logical   Suicidal Thoughts:  No  Homicidal Thoughts:  No  Memory:  WNL  Judgement:  Good  Insight:  Good  Psychomotor Activity:  unable to assess  Concentration:  Concentration: Good  Recall:  Good  Fund of Knowledge: Good  Language: Good  Assets:  Desire for Improvement Financial Resources/Insurance Housing Leisure Time Transportation  ADL's:  Intact  Cognition: WNL  Prognosis:  Good   DIAGNOSES:     ICD-10-CM   1. Generalized anxiety disorder  F41.1 diazepam  (VALIUM ) 5 MG tablet    2. Primary insomnia  F51.01 diazepam  (VALIUM ) 5 MG tablet    3. Tobacco abuse  Z72.0      Receiving Psychotherapy: No   RECOMMENDATIONS:   PDMP reviewed.  Valium  filled 04/03/2024.  Gabapentin  filled 04/10/2024.  Also on tramadol . I provided approximately 30  minutes of non-face-to-face time during this encounter, including time spent before and after the visit in records review, medical decision making, counseling pertinent to today's visit, and charting.   I provided approx 18 minutes in counseling for smoking cessation. Also the health benefits of quitting, and increased risk of cancers, heart disease, COPD d/t smoking.  She understands. States she's not ready to quit smoking.   She's been on Valium  alone for years. No falls or confusion.  Continue same treatment.  Continue Valium  5 mg, 1 p.o. every morning, 1 at noon, and 1 at bedtime routinely.  Occasionally  she can take an additional 5 mg at bedtime to help with sleep but not every night.  She verbalizes understanding. Return in 6 months. Must be in person due to [facility] rules.  Verneita Cooks, PA-C

## 2024-05-03 ENCOUNTER — Other Ambulatory Visit: Payer: Self-pay | Admitting: Family Medicine

## 2024-05-07 ENCOUNTER — Ambulatory Visit: Payer: Self-pay | Admitting: Family Medicine

## 2024-05-07 ENCOUNTER — Encounter: Payer: Self-pay | Admitting: Family Medicine

## 2024-05-07 ENCOUNTER — Ambulatory Visit: Admitting: Family Medicine

## 2024-05-07 VITALS — BP 120/70 | HR 77 | Temp 97.3°F | Resp 16 | Ht 59.0 in | Wt 125.8 lb

## 2024-05-07 DIAGNOSIS — I10 Essential (primary) hypertension: Secondary | ICD-10-CM

## 2024-05-07 DIAGNOSIS — Z0000 Encounter for general adult medical examination without abnormal findings: Secondary | ICD-10-CM | POA: Diagnosis not present

## 2024-05-07 DIAGNOSIS — Z23 Encounter for immunization: Secondary | ICD-10-CM | POA: Diagnosis not present

## 2024-05-07 DIAGNOSIS — E876 Hypokalemia: Secondary | ICD-10-CM

## 2024-05-07 LAB — CBC
HCT: 38.8 % (ref 36.0–46.0)
Hemoglobin: 13.2 g/dL (ref 12.0–15.0)
MCHC: 34 g/dL (ref 30.0–36.0)
MCV: 97.8 fl (ref 78.0–100.0)
Platelets: 140 K/uL — ABNORMAL LOW (ref 150.0–400.0)
RBC: 3.97 Mil/uL (ref 3.87–5.11)
RDW: 12.9 % (ref 11.5–15.5)
WBC: 4.8 K/uL (ref 4.0–10.5)

## 2024-05-07 LAB — LIPID PANEL
Cholesterol: 165 mg/dL (ref 0–200)
HDL: 67.5 mg/dL (ref >39.00)
LDL Cholesterol: 68 mg/dL (ref 0–99)
NonHDL: 97.45
Total CHOL/HDL Ratio: 2
Triglycerides: 148 mg/dL (ref 0.0–149.0)
VLDL: 29.6 mg/dL (ref 0.0–40.0)

## 2024-05-07 LAB — COMPREHENSIVE METABOLIC PANEL WITH GFR
ALT: 35 U/L (ref 0–35)
AST: 36 U/L (ref 0–37)
Albumin: 4.5 g/dL (ref 3.5–5.2)
Alkaline Phosphatase: 55 U/L (ref 39–117)
BUN: 14 mg/dL (ref 6–23)
CO2: 24 meq/L (ref 19–32)
Calcium: 10.4 mg/dL (ref 8.4–10.5)
Chloride: 97 meq/L (ref 96–112)
Creatinine, Ser: 0.9 mg/dL (ref 0.40–1.20)
GFR: 63.83 mL/min (ref >60.00)
Glucose, Bld: 96 mg/dL (ref 70–99)
Potassium: 4.1 meq/L (ref 3.5–5.1)
Sodium: 130 meq/L — ABNORMAL LOW (ref 135–145)
Total Bilirubin: 0.5 mg/dL (ref 0.2–1.2)
Total Protein: 6.7 g/dL (ref 6.0–8.3)

## 2024-05-07 MED ORDER — LISINOPRIL 40 MG PO TABS: 40.0000 mg | ORAL_TABLET | Freq: Every day | ORAL | 1 refills | Status: DC

## 2024-05-07 MED ORDER — LINACLOTIDE 290 MCG PO CAPS: 290.0000 ug | ORAL_CAPSULE | Freq: Every day | ORAL | 2 refills | Status: DC

## 2024-05-07 NOTE — Progress Notes (Signed)
 Chief Complaint  Patient presents with   Annual Exam    CPE     Well Woman Brenda Dunn is here for a complete physical.   Her last physical was >1 year ago.  Current diet: in general, a healthy diet. Current exercise: none. Weight is stable and she denies daytime fatigue. Seatbelt? Yes Advanced directive? Yes  Health Maintenance CCS- Done  Shingrix- No Lung cancer screening- Yes DEXA- Yes Mammogram- Yes Tetanus- Yes Pneumonia- Yes Hep C screen- Yes  Past Medical History:  Diagnosis Date   Anxiety    Benzodiazepine dependence (HCC)    Colonic polyp    mulitple polyps. repeat in 05/2019   Constipation    IBS   COPD (chronic obstructive pulmonary disease) (HCC)    Depression    GERD (gastroesophageal reflux disease)    Hyperlipidemia    Hypertension    Insomnia    Migraine headache    OAB (overactive bladder)    Osteoarthritis    Osteoporosis 12/19/2017   Post-surgical hypothyroidism    Pulmonary embolism (HCC)    Eliquis  to stop 05/03/19   Scoliosis      Past Surgical History:  Procedure Laterality Date   ABDOMINAL AORTOGRAM W/LOWER EXTREMITY Bilateral 03/24/2020   Procedure: ABDOMINAL AORTOGRAM W/LOWER EXTREMITY;  Surgeon: Serene Gaile ORN, MD;  Location: [facility];  Service: Cardiovascular;  Laterality: Bilateral;   BREAST CYST ASPIRATION Left    HEMORRHOID SURGERY     PERIPHERAL VASCULAR INTERVENTION Bilateral 03/24/2020   Procedure: PERIPHERAL VASCULAR INTERVENTION;  Surgeon: Serene Gaile ORN, MD;  Location: [facility];  Service: Cardiovascular;  Laterality: Bilateral;  ILIAC STENTS   THYROIDECTOMY     at [facility]   TUBAL LIGATION  1979    Medications  Current Outpatient Medications on File Prior to Visit  Medication Sig Dispense Refill   acetaminophen  (TYLENOL ) 500 MG tablet Take 1,000 mg by mouth every 6 (six) hours as needed for moderate pain.     ADVAIR HFA 115-21 MCG/ACT inhaler INHALE 2 PUFFS INTO THE LUNGS 2 (TWO) TIMES DAILY.  FILL FOR EITHER ADVAIR HFA OR GENERIC HFA - DEPENDING ON LOWEST COST WITH HER HTA PLAN. (PATIENT IS REQUESTING MDI AND NOT DPI) 12 each 1   albuterol  (VENTOLIN  HFA) 108 (90 Base) MCG/ACT inhaler Inhale 1-2 puffs into the lungs every 4 (four) hours as needed for wheezing or shortness of breath. 36 g 1   atorvastatin  (LIPITOR) 40 MG tablet TAKE 1 TABLET BY MOUTH EVERYDAY AT BEDTIME 90 tablet 1   diazepam  (VALIUM ) 5 MG tablet Take 1 tablet (5 mg total) by mouth in the morning, at noon, and at bedtime. May take another 5 mg (=10 mg) at bedtime occas for sleep. Must last 30 days 105 tablet 5   docusate sodium  (COLACE) 100 MG capsule Take 100-200 mg by mouth at bedtime. Patient taking 4-6 capsules daily     fenofibrate  (TRICOR ) 145 MG tablet Take 1 tablet (145 mg total) by mouth daily. 90 tablet 3   fluticasone -salmeterol (ADVAIR HFA) 45-21 MCG/ACT inhaler Inhale 2 puffs into the lungs 2 (two) times daily. 1 each 12   hydrocortisone  (ANUSOL -HC) 2.5 % rectal cream Place 1 Application rectally 2 (two) times daily. 30 g 1   lactulose  (CHRONULAC ) 10 GM/15ML solution TAKE 15 MLS (10 G TOTAL) BY MOUTH 2 (TWO) TIMES DAILY. 270 mL 1   levothyroxine  (SYNTHROID ) 100 MCG tablet TAKE 1 TABLET BY MOUTH EVERY DAY 90 tablet 1   lidocaine  (  XYLOCAINE ) 2 % solution Gargle 5-10 mL every 4 hours as needed for throat pain. Do not swallow. 200 mL 0   mirabegron  ER (MYRBETRIQ ) 50 MG TB24 tablet Take 50 mg by mouth daily.     mupirocin  ointment (BACTROBAN ) 2 % Apply 1 Application topically 2 (two) times daily. 30 g 2   prazosin  (MINIPRESS ) 5 MG capsule TAKE 2 CAPSULES (10 MG TOTAL) BY MOUTH AT BEDTIME. 180 capsule 2   No current facility-administered medications on file prior to visit.     Allergies No Known Allergies  Review of Systems: Constitutional:  no fevers Eye:  no recent significant change in vision Ears:  No changes in hearing Nose/Mouth/Throat:  no complaints of nasal congestion, no sore  throat Cardiovascular: no chest pain Respiratory:  No shortness of breath Gastrointestinal:  No change in bowel habits GU:  Female: negative for dysuria Integumentary:  no abnormal skin lesions reported Neurologic:  no headaches Endocrine:  denies unexplained weight changes  Exam BP 120/70 (BP Location: Left Arm, Patient Position: Sitting)   Pulse 77   Temp (!) 97.3 F (36.3 C) (Temporal)   Resp 16   Ht 4' 11 (1.499 m)   Wt 125 lb 12.8 oz (57.1 kg)   SpO2 95%   BMI 25.41 kg/m  General:  well developed, well nourished, in no apparent distress Skin:  no significant moles, warts, or growths Head:  no masses, lesions, or tenderness Eyes:  pupils equal and round, sclera anicteric without injection Ears:  canals without lesions, TMs shiny without retraction, no obvious effusion, no erythema Nose:  nares patent, mucosa normal, and no drainage Throat/Pharynx:  lips and gingiva without lesion; tongue and uvula midline; non-inflamed pharynx; no exudates or postnasal drainage Neck: neck supple without adenopathy, thyromegaly, or masses Lungs:  clear to auscultation, breath sounds equal bilaterally, no respiratory distress Cardio:  regular rate and rhythm, no bruits or LE edema Abdomen:  abdomen soft, nontender; bowel sounds normal; no masses or organomegaly Genital: Deferred Neuro:  gait normal; deep tendon reflexes normal and symmetric Psych: well oriented with normal range of affect and appropriate judgment/insight  Assessment and Plan  Well adult exam  Essential hypertension - Plan: CBC, Comprehensive metabolic panel with GFR, Lipid panel   Well 73 y.o. female. Counseled on diet and exercise. Fu shot today.  Shingrix politely declined.  Other orders as above. Follow up in 6 mo. The patient voiced understanding and agreement to the plan.  Mabel Mt Shirley, DO 05/07/24 11:02 AM

## 2024-05-07 NOTE — Patient Instructions (Signed)
 Give us  2-3 business days to get the results of your labs back.   Keep the diet clean and stay active.  Let us  know if you need anything.

## 2024-05-07 NOTE — Addendum Note (Signed)
 Addended by: Brenan Modesto M on: 05/07/2024 11:15 AM   Modules accepted: Orders

## 2024-05-08 ENCOUNTER — Encounter: Payer: Self-pay | Admitting: Family Medicine

## 2024-05-08 ENCOUNTER — Telehealth: Admitting: Physician Assistant

## 2024-05-14 ENCOUNTER — Other Ambulatory Visit (INDEPENDENT_AMBULATORY_CARE_PROVIDER_SITE_OTHER)

## 2024-05-14 ENCOUNTER — Encounter: Payer: Self-pay | Admitting: Family Medicine

## 2024-05-14 ENCOUNTER — Telehealth: Payer: Self-pay | Admitting: Family Medicine

## 2024-05-14 DIAGNOSIS — E876 Hypokalemia: Secondary | ICD-10-CM | POA: Diagnosis not present

## 2024-05-14 NOTE — Telephone Encounter (Signed)
 Sent pt message per Dr.[name] regarding her constipation.

## 2024-05-14 NOTE — Telephone Encounter (Signed)
 Pt came in for labs and wanted to see if pcp could send in medication for feeling sick when pt is constipated. Please advise and call pt with any questions.

## 2024-05-15 ENCOUNTER — Ambulatory Visit: Payer: Self-pay | Admitting: Family Medicine

## 2024-05-15 ENCOUNTER — Encounter: Payer: Self-pay | Admitting: Family Medicine

## 2024-05-15 LAB — BASIC METABOLIC PANEL WITH GFR
BUN: 10 mg/dL (ref 7–25)
CO2: 21 mmol/L (ref 20–32)
Calcium: 9.9 mg/dL (ref 8.6–10.4)
Chloride: 100 mmol/L (ref 98–110)
Creat: 0.89 mg/dL (ref 0.60–1.00)
Glucose, Bld: 105 mg/dL — ABNORMAL HIGH (ref 65–99)
Potassium: 4 mmol/L (ref 3.5–5.3)
Sodium: 131 mmol/L — ABNORMAL LOW (ref 135–146)
eGFR: 69 mL/min/1.73m2 (ref >=60)

## 2024-05-16 ENCOUNTER — Encounter: Payer: Self-pay | Admitting: Podiatry

## 2024-05-16 ENCOUNTER — Ambulatory Visit: Admitting: Podiatry

## 2024-05-16 DIAGNOSIS — M7752 Other enthesopathy of left foot: Secondary | ICD-10-CM | POA: Diagnosis not present

## 2024-05-16 DIAGNOSIS — B351 Tinea unguium: Secondary | ICD-10-CM | POA: Diagnosis not present

## 2024-05-16 DIAGNOSIS — M79675 Pain in left toe(s): Secondary | ICD-10-CM | POA: Diagnosis not present

## 2024-05-16 DIAGNOSIS — D492 Neoplasm of unspecified behavior of bone, soft tissue, and skin: Secondary | ICD-10-CM

## 2024-05-16 DIAGNOSIS — M79674 Pain in right toe(s): Secondary | ICD-10-CM

## 2024-05-16 MED ORDER — JOURNAVX 50 MG PO TABS: 1.0000 | ORAL_TABLET | Freq: Two times a day (BID) | ORAL | 0 refills | Status: DC

## 2024-05-16 NOTE — Progress Notes (Signed)
 Subjective: Chief Complaint  Patient presents with   RFc    Rm12 patient requesting toenails cut and trimmed     73 year old female presents the office today with multiple concerns.  She states that her right side is been doing well.  Sounded 1 session of physical therapy.  She states that that now the left side will ache.  She does not want to have an MRI done.  She states it starts to ache in the evening.  She does not report any injuries or falls.  She also states her nails to be trimmed as they are thickened elongated and cause discomfort.  No swelling, redness or any drainage.    She states the skin growth on the right second toe does not cause any pain and she wants to hold off but had it removed at this time.    Objective: AAO x3, NAD DP/PT pulses palpable bilaterally, CRT less than 3 seconds Unable to appreciate any area of tenderness in the right ankle.  There is mild discomfort in the anterior ankle joint line on the left side.  Not able to appreciate any area of pinpoint tenderness bilaterally.  There is no edema, erythema.  MMT 5/5. Nails are hypertrophic, dystrophic, brittle, discolored, elongated 10. No surrounding redness or drainage. Tenderness nails 1-5 bilaterally.  Chronic skin growth noted on the right second toe without any changes. No pain with calf compression, swelling, warmth, erythema  Assessment: Left ankle pain; skin growth; symptomatic onychomycosis  Plan:  Skin growth -We can discuss surgical excision of the lesion.  She does not want proceed with excision or biopsy at this time.  Will continue to monitor.  Ankle pain - Continue home exercises.  She states that she just wants to rest the ankle for now.  Discussed brace if needed.  She is asked about narcotic pain medication.  Prescribed Journavx.     Brenda Dunn Fees DPM

## 2024-05-17 ENCOUNTER — Encounter: Payer: Self-pay | Admitting: Podiatry

## 2024-05-28 ENCOUNTER — Other Ambulatory Visit: Payer: Self-pay | Admitting: Podiatry

## 2024-05-28 MED ORDER — JOURNAVX 50 MG PO TABS: 1.0000 | ORAL_TABLET | Freq: Two times a day (BID) | ORAL | 0 refills | Status: DC

## 2024-05-29 ENCOUNTER — Other Ambulatory Visit

## 2024-05-29 ENCOUNTER — Encounter: Payer: Self-pay | Admitting: Podiatry

## 2024-05-29 ENCOUNTER — Other Ambulatory Visit: Payer: Self-pay | Admitting: Podiatry

## 2024-05-29 NOTE — Telephone Encounter (Signed)
 She can go online and try the coupon. We got it covered last time.

## 2024-05-30 ENCOUNTER — Other Ambulatory Visit: Payer: Self-pay | Admitting: Podiatry

## 2024-05-30 MED ORDER — CELECOXIB 100 MG PO CAPS
100.0000 mg | ORAL_CAPSULE | Freq: Two times a day (BID) | ORAL | 0 refills | Status: AC
Start: 2024-05-30 — End: ?

## 2024-06-03 ENCOUNTER — Telehealth: Payer: Self-pay | Admitting: Emergency Medicine

## 2024-06-03 NOTE — Telephone Encounter (Signed)
 I spoke to patient regarding medication celecoxib , she stated that when she took the medication her bp drop, per Dr Gershon if her symptoms got worse to go to the hospital for further evaluation, pt states that she discontinued for now.  Nothing else follows

## 2024-06-04 NOTE — Telephone Encounter (Signed)
 Can you please schedule her a surgery consult?

## 2024-06-06 ENCOUNTER — Ambulatory Visit: Admitting: Podiatry

## 2024-06-06 ENCOUNTER — Encounter: Payer: Self-pay | Admitting: Podiatry

## 2024-06-06 VITALS — Ht 59.0 in | Wt 125.8 lb

## 2024-06-06 DIAGNOSIS — D492 Neoplasm of unspecified behavior of bone, soft tissue, and skin: Secondary | ICD-10-CM

## 2024-06-06 DIAGNOSIS — M7752 Other enthesopathy of left foot: Secondary | ICD-10-CM

## 2024-06-06 MED ORDER — CEPHALEXIN 500 MG PO CAPS: 500.0000 mg | ORAL_CAPSULE | Freq: Three times a day (TID) | ORAL | 0 refills | Status: DC

## 2024-06-06 MED ORDER — HYDROCODONE-ACETAMINOPHEN 10-325 MG PO TABS: 1.0000 | ORAL_TABLET | ORAL | 0 refills | Status: AC | PRN

## 2024-06-06 NOTE — Progress Notes (Unsigned)
 Subjective: Chief Complaint  Patient presents with   Wound Check    Pt is here to discuss surgery option for the right due to skin growth.   73 year old female presents the office today with above concerns.  She states that she wants to proceed with surgery on the right second toe to have the soft tissue mass, lesion excised.  We have discussed this on several occasions but given rubbing and causing discomfort she will to have this removed.  Objective: AAO x3, NAD DP/PT pulses palpable bilaterally, CRT less than 3 seconds Large soft tissue mass, cutaneous horn noted along the second toe lateral aspect.  She does get tenderness to this area there is no bleeding or opening or drainage.  No signs of infection.  There is no pain to the right foot or ankle at this time otherwise.  She still describes some ankle discomfort in the left side along the crease of the ankle.  No recent injuries.  No swelling. No pain with calf compression, swelling, warmth, erythema  Assessment: Soft tissue mass right second toe; capsulitis ankle  Plan: -All treatment options discussed with the patient including all alternatives, risks, complications.  -I requested a soft tissue lesion we discussed surgical excision.  We discussed likely will need to heal by secondary intention.  After discussion and her sister was present was proceed with surgery. -The incision placement as well as the postoperative course was discussed with the patient. I discussed risks of the surgery which include, but not limited to, infection, bleeding, pain, swelling, need for further surgery, delayed or nonhealing, painful or ugly scar, numbness or sensation changes,  recurrence, transfer lesions, further deformity, DVT/PE, loss of toe/foot. Patient understands these risks and wishes to proceed with surgery. The surgical consent was reviewed with the patient all 3 pages were signed. No promises or guarantees were given to the outcome of the  procedure. All questions were answered to the best of my ability. Before the surgery the patient was encouraged to call the office if there is any further questions. The surgery will be performed at the [facility] on an outpatient basis. -Hold off on any anti-inflammatories or steroids prior to surgery. -I sent medications for her now for postoperative use and advised her not to start this till after the surgery.  She was feeling get them now so they be ready as she does not drive and will need her sister to pick them up. -Regards to the left ankle discussed Tylenol , continue supportive shoe gear.  She can use a brace if needed. -Patient encouraged to call the office with any questions, concerns, change in symptoms.   Donnice JONELLE Fees DPM

## 2024-06-06 NOTE — Patient Instructions (Signed)

## 2024-06-07 ENCOUNTER — Telehealth: Payer: Self-pay | Admitting: Physician Assistant

## 2024-06-07 ENCOUNTER — Encounter: Payer: Self-pay | Admitting: Podiatry

## 2024-06-07 ENCOUNTER — Telehealth: Payer: Self-pay | Admitting: Podiatry

## 2024-06-07 NOTE — Telephone Encounter (Signed)
 Pt has RF on file. LF 10/24, due 11/21. Rx must last 30 days.

## 2024-06-07 NOTE — Telephone Encounter (Signed)
 Pt called at 1:35p stating that she is having foot surgery on 11/12.  Her sister has to bring her back to the podiatrist for follow up on 11/17 and she would like the Diazepam  sent to the pharmacy that day because she won't be able to drive and they won't deliver the medication because it's a narcotic. She said she talked to the pharmacist and he said he didn't see any problem with it going through on the 17th. She wanted to call ahead to let Verneita know what was going on so that she didn't think she was just asking for it early.

## 2024-06-07 NOTE — Telephone Encounter (Signed)
 DOS- 06/12/2024  EXC GANGLION TUMOR RT- 28090  [employer] AD EFFECTIVE DATE- 08/02/2023  DEDUCTIBLE- N/A OOP- $3400 ACCUMULATED- $534.66 COINSURANCE- $225 COPAY 30% COINS REF# [id number]  PER CHEMISE WITH [employer], NO PRIOR AUTH IS REQUIRED FOR CPT CODE 71909. REF# [id number] 06/07/2024 11:29 AM EST

## 2024-06-07 NOTE — Telephone Encounter (Signed)
 Next appt 3/25

## 2024-06-10 ENCOUNTER — Encounter: Payer: Self-pay | Admitting: Podiatry

## 2024-06-10 ENCOUNTER — Encounter: Payer: Self-pay | Admitting: Family Medicine

## 2024-06-11 NOTE — Telephone Encounter (Signed)
 Please see note from 11/7 and advise.

## 2024-06-12 DIAGNOSIS — F418 Other specified anxiety disorders: Secondary | ICD-10-CM | POA: Diagnosis not present

## 2024-06-12 DIAGNOSIS — I10 Essential (primary) hypertension: Secondary | ICD-10-CM | POA: Diagnosis not present

## 2024-06-12 DIAGNOSIS — E7849 Other hyperlipidemia: Secondary | ICD-10-CM | POA: Diagnosis not present

## 2024-06-12 DIAGNOSIS — D492 Neoplasm of unspecified behavior of bone, soft tissue, and skin: Secondary | ICD-10-CM | POA: Diagnosis not present

## 2024-06-12 NOTE — Telephone Encounter (Signed)
 There's been a hx of her getting early RF and overtaking it, so no she can't get it almost a week too early.  Even getting it on 11/21 is 2 days early but I will allow that.

## 2024-06-12 NOTE — Telephone Encounter (Signed)
 LVM to [facility]

## 2024-06-13 ENCOUNTER — Other Ambulatory Visit: Payer: Self-pay | Admitting: Podiatry

## 2024-06-13 ENCOUNTER — Encounter: Payer: Self-pay | Admitting: Podiatry

## 2024-06-13 MED ORDER — HYDROCODONE-ACETAMINOPHEN 10-325 MG PO TABS: 1.0000 | ORAL_TABLET | Freq: Four times a day (QID) | ORAL | 0 refills | Status: AC | PRN

## 2024-06-14 ENCOUNTER — Encounter: Payer: Self-pay | Admitting: Podiatry

## 2024-06-14 DIAGNOSIS — Z9089 Acquired absence of other organs: Secondary | ICD-10-CM | POA: Diagnosis not present

## 2024-06-14 DIAGNOSIS — R188 Other ascites: Secondary | ICD-10-CM | POA: Diagnosis not present

## 2024-06-14 DIAGNOSIS — J439 Emphysema, unspecified: Secondary | ICD-10-CM | POA: Diagnosis not present

## 2024-06-14 DIAGNOSIS — Z5329 Procedure and treatment not carried out because of patient's decision for other reasons: Secondary | ICD-10-CM | POA: Diagnosis not present

## 2024-06-14 DIAGNOSIS — E785 Hyperlipidemia, unspecified: Secondary | ICD-10-CM | POA: Diagnosis not present

## 2024-06-14 DIAGNOSIS — R0602 Shortness of breath: Secondary | ICD-10-CM | POA: Diagnosis not present

## 2024-06-14 DIAGNOSIS — J432 Centrilobular emphysema: Secondary | ICD-10-CM | POA: Diagnosis not present

## 2024-06-14 DIAGNOSIS — R7402 Elevation of levels of lactic acid dehydrogenase (LDH): Secondary | ICD-10-CM | POA: Diagnosis not present

## 2024-06-14 DIAGNOSIS — D72829 Elevated white blood cell count, unspecified: Secondary | ICD-10-CM | POA: Diagnosis not present

## 2024-06-14 DIAGNOSIS — R918 Other nonspecific abnormal finding of lung field: Secondary | ICD-10-CM | POA: Diagnosis not present

## 2024-06-14 DIAGNOSIS — E039 Hypothyroidism, unspecified: Secondary | ICD-10-CM | POA: Diagnosis not present

## 2024-06-14 DIAGNOSIS — K229 Disease of esophagus, unspecified: Secondary | ICD-10-CM | POA: Diagnosis not present

## 2024-06-14 DIAGNOSIS — F17210 Nicotine dependence, cigarettes, uncomplicated: Secondary | ICD-10-CM | POA: Diagnosis not present

## 2024-06-14 DIAGNOSIS — K828 Other specified diseases of gallbladder: Secondary | ICD-10-CM | POA: Diagnosis not present

## 2024-06-14 DIAGNOSIS — I959 Hypotension, unspecified: Secondary | ICD-10-CM | POA: Diagnosis not present

## 2024-06-14 DIAGNOSIS — K5989 Other specified functional intestinal disorders: Secondary | ICD-10-CM | POA: Diagnosis not present

## 2024-06-14 DIAGNOSIS — K838 Other specified diseases of biliary tract: Secondary | ICD-10-CM | POA: Diagnosis not present

## 2024-06-14 DIAGNOSIS — K3189 Other diseases of stomach and duodenum: Secondary | ICD-10-CM | POA: Diagnosis not present

## 2024-06-14 DIAGNOSIS — A419 Sepsis, unspecified organism: Secondary | ICD-10-CM | POA: Diagnosis not present

## 2024-06-14 DIAGNOSIS — Z79899 Other long term (current) drug therapy: Secondary | ICD-10-CM | POA: Diagnosis not present

## 2024-06-14 DIAGNOSIS — F419 Anxiety disorder, unspecified: Secondary | ICD-10-CM | POA: Diagnosis not present

## 2024-06-14 DIAGNOSIS — J9811 Atelectasis: Secondary | ICD-10-CM | POA: Diagnosis not present

## 2024-06-14 DIAGNOSIS — R946 Abnormal results of thyroid function studies: Secondary | ICD-10-CM | POA: Diagnosis not present

## 2024-06-14 DIAGNOSIS — K5900 Constipation, unspecified: Secondary | ICD-10-CM | POA: Diagnosis not present

## 2024-06-15 ENCOUNTER — Encounter: Payer: Self-pay | Admitting: Podiatry

## 2024-06-17 ENCOUNTER — Encounter: Payer: Self-pay | Admitting: Medical

## 2024-06-17 ENCOUNTER — Encounter: Payer: Self-pay | Admitting: Podiatry

## 2024-06-17 ENCOUNTER — Ambulatory Visit: Admitting: Podiatry

## 2024-06-17 ENCOUNTER — Ambulatory Visit: Admitting: Medical

## 2024-06-17 VITALS — BP 110/60 | HR 74 | Temp 97.9°F | Resp 14 | Ht 59.0 in | Wt 132.8 lb

## 2024-06-17 VITALS — BP 90/53 | HR 78 | Temp 97.3°F

## 2024-06-17 DIAGNOSIS — I10 Essential (primary) hypertension: Secondary | ICD-10-CM | POA: Diagnosis not present

## 2024-06-17 DIAGNOSIS — D72829 Elevated white blood cell count, unspecified: Secondary | ICD-10-CM | POA: Diagnosis not present

## 2024-06-17 DIAGNOSIS — R7989 Other specified abnormal findings of blood chemistry: Secondary | ICD-10-CM | POA: Diagnosis not present

## 2024-06-17 DIAGNOSIS — E782 Mixed hyperlipidemia: Secondary | ICD-10-CM

## 2024-06-17 DIAGNOSIS — R944 Abnormal results of kidney function studies: Secondary | ICD-10-CM | POA: Diagnosis not present

## 2024-06-17 DIAGNOSIS — E039 Hypothyroidism, unspecified: Secondary | ICD-10-CM | POA: Diagnosis not present

## 2024-06-17 DIAGNOSIS — D492 Neoplasm of unspecified behavior of bone, soft tissue, and skin: Secondary | ICD-10-CM

## 2024-06-17 MED ORDER — LISINOPRIL 20 MG PO TABS: 20.0000 mg | ORAL_TABLET | Freq: Every day | ORAL | 0 refills | Status: DC

## 2024-06-17 NOTE — Progress Notes (Signed)
 Subjective:    Patient ID: Brenda Dunn, female    DOB: [birth year], 73 y.o.   MRN: [id number]  HPI  On 06/14/2024 went to the ED.    Chief Complaint  Patient presents with  Hypotension  Pt POV with vomiting. Had surgery on her right foot on Wednesday. Hypotensive in triage.   Brenda Dunn has a past medical history of COPD, hypertension, hyperlipidemia, anxiety on chronic benzodiazepines, and tobacco use. She presents to the ED with nausea and vomiting. She recently had a mass removed from her right second toe. She reports that she came in today for nausea and vomiting. This came up fairly suddenly. She feels a little bit of heartburn in her anterior chest that is going up to her jaw. She denies fevers, chills, cough, dyspnea, abdominal pain, dysuria, or frequency. She has been dealing with constipation, and this is a chronic issue. She denies any new medication use.  History provided by: Patient Hypotension  Past Medical History:  Diagnosis Date  Anxiety  Chronic constipation  Emphysema of lung (*)  Hypertension  IBS (irritable bowel syndrome)  Overactive bladder  Scoliosis   Lab results:  CBC AND DIFFERENTIAL - Abnormal  Result Value  WBC 11.2 (*)  RBC 3.59 (*)  HGB 12.0  HCT 37.5  MCV 104.5 (*)  MCH 33.4 (*)  MCHC 32.0 (*)  Plt Ct 170  RDW SD 50.4 (*)  MPV 11.8  NRBC% 0.0  Absolute NRBC Count 0.00  NEUTROPHIL % 84.9  LYMPHOCYTE % 6.2  MONOCYTE % 7.7  Eosinophil % 0.2  BASOPHIL % 0.2  IG% 0.8  ABSOLUTE NEUTROPHIL COUNT 9.51 (*)  ABSOLUTE LYMPHOCYTE COUNT 0.69 (*)  Absolute Monocyte Count 0.86 (*)  Absolute Eosinophil Count 0.02  Absolute Basophil Count 0.02  Absolute Immature Granulocyte Count 0.09 (*)  COMPREHENSIVE METABOLIC PANEL - Abnormal  Na 859  Potassium 4.5  Cl 102  CO2 22  AGAP 16  Glucose 117 (*)  BUN 21  Creatinine 1.31 (*)  Ca 11.4 (*)  ALK PHOS 106  T Bili 0.2  Total Protein 7.1  Alb 4.4  GLOBULIN 2.7  ALBUMIN/GLOBULIN  RATIO 1.6  BUN/CREAT RATIO 16.0  ALT 71 (*)  AST 98 (*)  eGFR 43 (*)  Comment: Normal GFR (glomerular filtration rate) > 60 mL/min/1.73 meters squared, < 60 may include impaired kidney function. Calculation based on the Chronic Kidney Disease Epidemiology Collaboration (CK-EPI)equation refit without adjustment for race.  GEN5 CARDIAC TROPONIN T (TNT5) BASELINE - Abnormal  TnT-Gen5 (0hr) 18 (*)  Comment: An elevated Troponin indicates myocardial damage. Elevated troponin may also be due to pulmonary emboli, aortic dissection, heart failure, trauma, toxins and ischemia in the setting of critical illness.    Collection Time Result Time Calculated R Axis Calculated T Axis ECGDiag  06/14/24 13:36:20 06/14/24 14:41:00 -72 48 Normal sinus rhythm Left axis deviation Low voltage QRS Possible Anterolateral infarct (cited on or before 14-Jun-2024) Abnormal ECG When compared with ECG of 14-Jun-2024 11:39, QT has shortened No acute injury pattern Brien Kung ([id number]) on 06/14/2024 2:40:56 PM certifies that he/she has reviewed the ECG tracing and confirms the independent interpretation is correct.   Pt declined to stay in hospital.  The 10-year ASCVD risk score (Arnett DK, et al., 2019) is: 19.6%   Values used to calculate the score:     Age: 75 years     Clincally relevant sex: Female     Is Non-Hispanic African American: No  Diabetic: No     Tobacco smoker: Yes     Systolic Blood Pressure: 120 mmHg     Is BP treated: Yes     HDL Cholesterol: 67.5 mg/dL     Total Cholesterol: 165 mg/dL       Pt has hypertension who presents with low blood pressure and post-surgical complications.  She underwent surgery for a soft tissue mass on his foot, during which she experienced extremely low blood pressure managed with IV fluids. Post-surgery, she developed severe nausea and vomiting, leading to an emergency department visit where his blood pressure was critically low. She was treated with IV  fluids and returned home.  At home, she monitored his blood pressure, noting fluctuations with a low of 63/38, which he stabilized by drinking water and iv fluids. Recent readings have been around 110/60. She continues to take lisinopril  despite low readings. She also takes atorvastatin  40 mg.  She has a history of thyroidectomy and takes 100 mcg of thyroid  medication. Recent labs showed elevated TSH. Post-surgery, she had elevated lactic acid levels, which normalized. She reports feeling tired, attributing it to his thyroid  condition. During a recent emergency visit, elevated cardiac proteins were noted, but he has no history of heart attacks or kidney issues.  She is a widower and relies on his sister for support. He has a history of smoking.    Review of Systems  Constitutional:  Negative for chills and fever.  Eyes:  Negative for visual disturbance.  Respiratory:  Negative for chest tightness and shortness of breath.   Cardiovascular:  Negative for chest pain and palpitations.  Gastrointestinal:  Negative for abdominal pain, diarrhea, rectal pain and vomiting.  Genitourinary:  Negative for difficulty urinating, dysuria and frequency.  Musculoskeletal:  Negative for back pain, myalgias and neck stiffness.  Skin:  Negative for rash.  Neurological:  Negative for dizziness, syncope, weakness and light-headedness.  Hematological:  Negative for adenopathy.  Psychiatric/Behavioral:  Negative for behavioral problems and suicidal ideas.    Past Medical History:  Diagnosis Date   Anxiety    Benzodiazepine dependence (HCC)    Colonic polyp    mulitple polyps. repeat in 05/2019   Constipation    IBS   COPD (chronic obstructive pulmonary disease) (HCC)    Depression    GERD (gastroesophageal reflux disease)    Hyperlipidemia    Hypertension    Insomnia    Migraine headache    OAB (overactive bladder)    Osteoarthritis    Osteoporosis 12/19/2017   Post-surgical hypothyroidism     Pulmonary embolism (HCC)    Eliquis  to stop 05/03/19   Scoliosis      Social History   Socioeconomic History   Marital status: Widowed    Spouse name: Not on file   Number of children: Not on file   Years of education: Not on file   Highest education level: Not on file  Occupational History   Occupation: disabled due my nerves  Tobacco Use   Smoking status: Every Day    Current packs/day: 1.50    Average packs/day: 1.5 packs/day for 44.0 years (66.0 ttl pk-yrs)    Types: Cigarettes   Smokeless tobacco: Never  Vaping Use   Vaping status: Former  Substance and Sexual Activity   Alcohol use: No   Drug use: No   Sexual activity: Not on file  Other Topics Concern   Not on file  Social History Narrative   Not on file   Social Drivers  of Health   Financial Resource Strain: Low Risk  (02/13/2020)   Overall Financial Resource Strain (CARDIA)    Difficulty of Paying Living Expenses: Not hard at all  Food Insecurity: No Food Insecurity (02/13/2020)   Hunger Vital Sign    Worried About Running Out of Food in the Last Year: Never true    Ran Out of Food in the Last Year: Never true  Transportation Needs: No Transportation Needs (02/13/2020)   PRAPARE - Administrator, Civil Service (Medical): No    Lack of Transportation (Non-Medical): No  Physical Activity: Inactive (04/08/2022)   Exercise Vital Sign    Days of Exercise per Week: 0 days    Minutes of Exercise per Session: 0 min  Stress: No Stress Concern Present (04/08/2022)   [employer] of Occupational Health - Occupational Stress Questionnaire    Feeling of Stress : Not at all  Social Connections: Moderately Isolated (04/08/2022)   Social Connection and Isolation Panel    Frequency of Communication with Friends and Family: More than three times a week    Frequency of Social Gatherings with Friends and Family: Twice a week    Attends Religious Services: More than 4 times per year    Active Member of [employer] or  Organizations: No    Attends Banker Meetings: Never    Marital Status: Widowed  Intimate Partner Violence: Not At Risk (06/14/2024)   Received from [facility]   HITS    Over the last 12 months how often did your partner physically hurt you?: Never    Over the last 12 months how often did your partner insult you or talk down to you?: Never    Over the last 12 months how often did your partner threaten you with physical harm?: Never    Over the last 12 months how often did your partner scream or curse at you?: Never    Past Surgical History:  Procedure Laterality Date   ABDOMINAL AORTOGRAM W/LOWER EXTREMITY Bilateral 03/24/2020   Procedure: ABDOMINAL AORTOGRAM W/LOWER EXTREMITY;  Surgeon: Serene Gaile ORN, MD;  Location: [facility];  Service: Cardiovascular;  Laterality: Bilateral;   BREAST CYST ASPIRATION Left    HEMORRHOID SURGERY     PERIPHERAL VASCULAR INTERVENTION Bilateral 03/24/2020   Procedure: PERIPHERAL VASCULAR INTERVENTION;  Surgeon: Serene Gaile ORN, MD;  Location: [facility]VAS[facility];  Service: Cardiovascular;  Laterality: Bilateral;  ILIAC STENTS   THYROIDECTOMY     at [facility]   TUBAL LIGATION  1979    Family History  Problem Relation Age of Onset   Breast cancer Mother    Arthritis Mother    Hearing loss Mother    Hyperlipidemia Mother    Hypertension Mother    Anxiety disorder Mother    Lung cancer Maternal Uncle    Anxiety disorder Maternal Uncle    Lung cancer Maternal Grandmother    Heart attack Maternal Grandmother    Anxiety disorder Maternal Grandmother    Asthma Father    COPD Father    Hyperlipidemia Father    Hypertension Father    Alcohol abuse Father    Anxiety disorder Sister    Hypertension Sister    Thyroid  cancer Sister    Anxiety disorder Brother    Hypertension Brother    Drug abuse Son    Anxiety disorder Sister    Hypertension Sister    Anxiety disorder Sister    Hypertension Sister    Hyperlipidemia Son  No Known Allergies  Current Outpatient Medications on File Prior to Visit  Medication Sig Dispense Refill   ADVAIR HFA 115-21 MCG/ACT inhaler INHALE 2 PUFFS INTO THE LUNGS 2 (TWO) TIMES DAILY. FILL FOR EITHER ADVAIR HFA OR GENERIC HFA - DEPENDING ON LOWEST COST WITH HER HTA PLAN. (PATIENT IS REQUESTING MDI AND NOT DPI) 12 each 1   albuterol  (VENTOLIN  HFA) 108 (90 Base) MCG/ACT inhaler Inhale 1-2 puffs into the lungs every 4 (four) hours as needed for wheezing or shortness of breath. 36 g 1   atorvastatin  (LIPITOR) 40 MG tablet TAKE 1 TABLET BY MOUTH EVERYDAY AT BEDTIME 90 tablet 1   celecoxib  (CELEBREX ) 100 MG capsule Take 1 capsule (100 mg total) by mouth 2 (two) times daily. 30 capsule 0   cephALEXin  (KEFLEX ) 500 MG capsule Take 1 capsule (500 mg total) by mouth 3 (three) times daily. 21 capsule 0   diazepam  (VALIUM ) 5 MG tablet Take 1 tablet (5 mg total) by mouth in the morning, at noon, and at bedtime. May take another 5 mg (=10 mg) at bedtime occas for sleep. Must last 30 days 105 tablet 5   docusate sodium  (COLACE) 100 MG capsule Take 100-200 mg by mouth at bedtime. Patient taking 4-6 capsules daily     fenofibrate  (TRICOR ) 145 MG tablet Take 1 tablet (145 mg total) by mouth daily. 90 tablet 3   fluticasone -salmeterol (ADVAIR HFA) 45-21 MCG/ACT inhaler Inhale 2 puffs into the lungs 2 (two) times daily. 1 each 12   HYDROcodone -acetaminophen  (NORCO) 10-325 MG tablet Take 1 tablet by mouth every 6 (six) hours as needed for up to 5 days. 15 tablet 0   hydrocortisone  (ANUSOL -HC) 2.5 % rectal cream Place 1 Application rectally 2 (two) times daily. 30 g 1   lactulose  (CHRONULAC ) 10 GM/15ML solution TAKE 15 MLS (10 G TOTAL) BY MOUTH 2 (TWO) TIMES DAILY. 270 mL 1   levothyroxine  (SYNTHROID ) 100 MCG tablet TAKE 1 TABLET BY MOUTH EVERY DAY 90 tablet 1   lidocaine  (XYLOCAINE ) 2 % solution Gargle 5-10 mL every 4 hours as needed for throat pain. Do not swallow. 200 mL 0   linaclotide  (LINZESS ) 290  MCG CAPS capsule Take 1 capsule (290 mcg total) by mouth daily before breakfast. 90 capsule 2   lisinopril  (ZESTRIL ) 40 MG tablet Take 1 tablet (40 mg total) by mouth daily. 90 tablet 1   mirabegron  ER (MYRBETRIQ ) 50 MG TB24 tablet Take 50 mg by mouth daily.     mupirocin  ointment (BACTROBAN ) 2 % Apply 1 Application topically 2 (two) times daily. 30 g 2   prazosin  (MINIPRESS ) 5 MG capsule TAKE 2 CAPSULES (10 MG TOTAL) BY MOUTH AT BEDTIME. 180 capsule 2   Suzetrigine (JOURNAVX) 50 MG TABS Take 1 Dose by mouth 2 (two) times daily. 14 tablet 0   No current facility-administered medications on file prior to visit.    BP 110/60   Pulse 74   Temp 97.9 F (36.6 C) (Oral)   Resp 14   Ht 4' 11 (1.499 m)   Wt 132 lb 12.8 oz (60.2 kg)   SpO2 97%   BMI 26.82 kg/m          Objective:   Physical Exam  General Mental Status- Alert. General Appearance- Not in acute distress.   Skin General: Color- Normal Color. Moisture- Normal Moisture.  Neck Carotid Arteries- Normal color. Moisture- Normal Moisture. No carotid bruits. No JVD.  Chest and Lung Exam Auscultation: Breath Sounds:-CTA  Cardiovascular Auscultation:Rythm- RRR  Murmurs & Other Heart Sounds:Auscultation of the heart reveals- No Murmurs.  Abdomen Inspection:-Inspeection Normal. Palpation/Percussion:Note:No mass. Palpation and Percussion of the abdomen reveal- Non Tender, Non Distended + BS, no rebound or guarding.   Neurologic Cranial Nerve exam:- CN III-XII intact(No nystagmus), symmetric smile. Strength:- 5/5 equal and symmetric strength both upper and lower extremities.       Assessment & Plan:   Hypertension , recent episode Recent post-surgical hypotension episode with stabilized blood pressure. Possible factors: dehydration, low thyroid  function, elevated lactic acid. No infection source identified. - Rechecked TSH and T4 levels. - Rechecked lactic acid levels. - Monitor blood pressure daily and report  readings by Friday. - Adjusted lisinopril  to 20 mg daily.   Hypothyroidism, post-thyroidectomy Post-thyroidectomy hypothyroidism managed with levothyroxine . Elevated TSH suggests possible dose adjustment. - Rechecked TSH and T4 levels.  Recent soft tissue foot mass, post-surgical Post-surgical status following removal of soft tissue mass on foot.  Elevated troponin, etiology unclear, under evaluation Elevated troponin with no acute cardiac symptoms. No current symptoms.  Cardiovascular risk score elevated at 20%. - Referred to cardiologist for further evaluation. - Monitor for any cardiac symptoms and seek emergency care if symptoms develop. -decreased pattern in ED and pt did not stay for evaluation. No chf.  No acute injury pattern on ekg. Risk factors and high ascvd score. Refer to cardiology. If pending appt cardiac symptoms be seen in the ED.  Hyperlipidemia Managed with atorvastatin  40 mg. Cholesterol levels within target range as of October 2025. - Continue atorvastatin  40 mg daily.  Follow up in week of Dec approximate or sooner if needed   [employer], PA-C   I personally spent a total of 43 minutes in the care of the patient today including performing a medically appropriate exam/evaluation, counseling and educating, placing orders, referring and communicating with other health care professionals, and documenting clinical information in the EHR.

## 2024-06-17 NOTE — Patient Instructions (Addendum)
 Hypertension , recent episode hypotension Recent post-surgical hypotension episode with stabilized blood pressure. Possible factors: dehydration, low thyroid  function, elevated lactic acid. No infection source identified. - Rechecked TSH and T4 levels. - Rechecked lactic acid levels. - Monitor blood pressure daily and report readings by Friday. - Adjusted lisinopril  to 20 mg daily.   Hypothyroidism, post-thyroidectomy Post-thyroidectomy hypothyroidism managed with levothyroxine . Elevated TSH suggests possible dose adjustment. - Rechecked TSH and T4 levels.  Recent soft tissue foot mass, post-surgical Post-surgical status following removal of soft tissue mass on foot.  Elevated troponin, etiology unclear, under evaluation Elevated troponin with no acute cardiac symptoms. No current symptoms.  Cardiovascular risk score elevated at 20%. - Referred to cardiologist for further evaluation. - Monitor for any cardiac symptoms and seek emergency care if symptoms develop. -decreased pattern in ED and pt did not stay for evaluation. No chf.  No acute injury pattern on ekg. Risk factors and high ascvd score. Refer to cardiology. If pending appt cardiac symptoms be seen in the ED.  Hyperlipidemia Managed with atorvastatin  40 mg. Cholesterol levels within target range as of October 2025. - Continue atorvastatin  40 mg daily.  Follow up in week of Dec approximate or sooner if needed

## 2024-06-17 NOTE — Progress Notes (Signed)
 Patient presents for post-op visit today, POV#1 DOS 06/12/2024 RT EXC GANGLION/TUMOR  My foot is excellent. Friday morning I felt sick on my stomach, had some vomiting. My blood pressure was very low. Went to hospital..  RN Notes: Patient is talking in full sentences, pupils are pinpoint. Last dose of hydrocodone  was approximately 630 am. Patient is somnolent in chair between questions. Patient states I think I could have taken a pain pill since this morning, but my mind is not all there today. When provider and RN returned to exam room patient states, I need to correct that, I did not take the other pain pill.  Vital Signs: Today's Vitals   06/17/24 1329 06/17/24 1333  BP: (!) 96/52 (!) 90/53  Pulse: 78 78  Temp: (!) 97.3 F (36.3 C)   PainSc: 0-No pain   PainLoc: Foot       Radiographs: []  Taken [x]  Not taken  Surgical Site Assessment:  - Dressing:  [x]  Minimal dry blood, intact []  Reinforced   []  Changed     -RN Notes: n/a  - Incision:  [x]  CDI (clean, dry, intact)  [x]  Mild erythema  []  Drainage noted   -RN Notes: n/a  - Swelling:  []  None  [x]  Mild  []  Moderate   []  Significant     -RN Notes: n/a  - Bruising:  []  None  [x]  Present: some bruising on the toe   - Sutures/Staples:  []  None [x]  Intact  []  Removed Today  [x]  Plan to remove at next visit   -Cast/Splint/Pins: [x]  None []  Intact []  Removed Today []  Plan to remove at next visit []  Replaced  -Signs of infection:  [x]  None  []  Present - Describe: n/a  -DME:    [x]  None []  AFW []  Surgical shoe []  Cast  []  Splint  -Walking status:  [x]  Full WB  []  Partial WB  []  NWB  -Utilizing device:  [x]  None []  Knee Scooter []  Crutches []  Wheelchair    DVT assessment:  [x]  Denies symptoms []  Chest pain/SOB []  Pain in calf/redness/warmth   Redressed DSD and ace wrap. Educated on signs of infection, proper dressing care, pain management, and weight bearing status. Patient will contact provider with any new or  worsening symptoms. The provider assessed the patient today and reviewed instructions regarding plan of care.  -  Patient seen and evaluated by myself.  Incisions healing well sutures intact but any evidence of dehiscence.  Minimal edema present.  No signs of cellulitis.  No significant pain on exam.  Appears to be healing well at this time postoperatively.  We can advance her noted from the hospital.  Her blood pressure is low.  Advised her not to take any further pain medication.  She did follow-up with her PCP as well today.  No signs of infection from the foot.  Dressing reapplied.  Discussed with her she can change the dressing at home if able.  She can continue with her sandal.  Hold off on getting the incision wet for now.   Brenda Dunn DPM

## 2024-06-18 ENCOUNTER — Ambulatory Visit: Payer: Self-pay | Admitting: Medical

## 2024-06-18 ENCOUNTER — Encounter: Payer: Self-pay | Admitting: Medical

## 2024-06-18 ENCOUNTER — Encounter: Payer: Self-pay | Admitting: Podiatry

## 2024-06-18 ENCOUNTER — Encounter: Payer: Self-pay | Admitting: Family Medicine

## 2024-06-18 LAB — COMPREHENSIVE METABOLIC PANEL WITH GFR
ALT: 59 U/L — ABNORMAL HIGH (ref 0–35)
AST: 66 U/L — ABNORMAL HIGH (ref 0–37)
Albumin: 4.1 g/dL (ref 3.5–5.2)
Alkaline Phosphatase: 82 U/L (ref 39–117)
BUN: 15 mg/dL (ref 6–23)
CO2: 27 meq/L (ref 19–32)
Calcium: 9.7 mg/dL (ref 8.4–10.5)
Chloride: 106 meq/L (ref 96–112)
Creatinine, Ser: 1.17 mg/dL (ref 0.40–1.20)
GFR: 46.55 mL/min — ABNORMAL LOW (ref >60.00)
Glucose, Bld: 113 mg/dL — ABNORMAL HIGH (ref 70–99)
Potassium: 3.9 meq/L (ref 3.5–5.1)
Sodium: 142 meq/L (ref 135–145)
Total Bilirubin: 0.4 mg/dL (ref 0.2–1.2)
Total Protein: 6.1 g/dL (ref 6.0–8.3)

## 2024-06-18 LAB — CBC WITH DIFFERENTIAL/PLATELET
Basophils Absolute: 0 K/uL (ref 0.0–0.1)
Basophils Relative: 0.8 % (ref 0.0–3.0)
Eosinophils Absolute: 0.1 K/uL (ref 0.0–0.7)
Eosinophils Relative: 2.5 % (ref 0.0–5.0)
HCT: 33.4 % — ABNORMAL LOW (ref 36.0–46.0)
Hemoglobin: 11.6 g/dL — ABNORMAL LOW (ref 12.0–15.0)
Lymphocytes Relative: 25.2 % (ref 12.0–46.0)
Lymphs Abs: 1 K/uL (ref 0.7–4.0)
MCHC: 34.6 g/dL (ref 30.0–36.0)
MCV: 98.1 fl (ref 78.0–100.0)
Monocytes Absolute: 0.3 K/uL (ref 0.1–1.0)
Monocytes Relative: 8 % (ref 3.0–12.0)
Neutro Abs: 2.6 K/uL (ref 1.4–7.7)
Neutrophils Relative %: 63.5 % (ref 43.0–77.0)
Platelets: 132 K/uL — ABNORMAL LOW (ref 150.0–400.0)
RBC: 3.4 Mil/uL — ABNORMAL LOW (ref 3.87–5.11)
RDW: 13.3 % (ref 11.5–15.5)
WBC: 4.1 K/uL (ref 4.0–10.5)

## 2024-06-18 LAB — T4, FREE: Free T4: 0.95 ng/dL (ref 0.60–1.60)

## 2024-06-18 LAB — TSH: TSH: 12.36 u[IU]/mL — ABNORMAL HIGH (ref 0.35–5.50)

## 2024-06-19 ENCOUNTER — Encounter: Payer: Self-pay | Admitting: Family Medicine

## 2024-06-19 ENCOUNTER — Encounter: Payer: Self-pay | Admitting: Medical

## 2024-06-19 ENCOUNTER — Other Ambulatory Visit: Payer: Self-pay | Admitting: Family Medicine

## 2024-06-19 ENCOUNTER — Ambulatory Visit: Admitting: Family Medicine

## 2024-06-19 VITALS — BP 110/60 | HR 87 | Temp 98.0°F | Resp 16 | Ht 59.0 in | Wt 129.2 lb

## 2024-06-19 DIAGNOSIS — D649 Anemia, unspecified: Secondary | ICD-10-CM | POA: Diagnosis not present

## 2024-06-19 DIAGNOSIS — E039 Hypothyroidism, unspecified: Secondary | ICD-10-CM

## 2024-06-19 DIAGNOSIS — Z789 Other specified health status: Secondary | ICD-10-CM | POA: Diagnosis not present

## 2024-06-19 DIAGNOSIS — K5909 Other constipation: Secondary | ICD-10-CM | POA: Diagnosis not present

## 2024-06-19 DIAGNOSIS — E663 Overweight: Secondary | ICD-10-CM

## 2024-06-19 LAB — IBC + FERRITIN
Ferritin: 35.6 ng/mL (ref 10.0–291.0)
Iron: 89 ug/dL (ref 42–145)
Saturation Ratios: 21.5 % (ref 20.0–50.0)
TIBC: 414.4 ug/dL (ref 250.0–450.0)
Transferrin: 296 mg/dL (ref 212.0–360.0)

## 2024-06-19 LAB — CBC
HCT: 38.4 % (ref 36.0–46.0)
Hemoglobin: 13 g/dL (ref 12.0–15.0)
MCHC: 34 g/dL (ref 30.0–36.0)
MCV: 98.4 fl (ref 78.0–100.0)
Platelets: 150 K/uL (ref 150.0–400.0)
RBC: 3.9 Mil/uL (ref 3.87–5.11)
RDW: 13.5 % (ref 11.5–15.5)
WBC: 4.9 K/uL (ref 4.0–10.5)

## 2024-06-19 LAB — COMPREHENSIVE METABOLIC PANEL WITH GFR
ALT: 96 U/L — ABNORMAL HIGH (ref 0–35)
AST: 118 U/L — ABNORMAL HIGH (ref 0–37)
Albumin: 4.4 g/dL (ref 3.5–5.2)
Alkaline Phosphatase: 100 U/L (ref 39–117)
BUN: 11 mg/dL (ref 6–23)
CO2: 25 meq/L (ref 19–32)
Calcium: 10.8 mg/dL — ABNORMAL HIGH (ref 8.4–10.5)
Chloride: 102 meq/L (ref 96–112)
Creatinine, Ser: 0.83 mg/dL (ref 0.40–1.20)
GFR: 70.29 mL/min (ref >60.00)
Glucose, Bld: 98 mg/dL (ref 70–99)
Potassium: 3.8 meq/L (ref 3.5–5.1)
Sodium: 136 meq/L (ref 135–145)
Total Bilirubin: 0.5 mg/dL (ref 0.2–1.2)
Total Protein: 7.1 g/dL (ref 6.0–8.3)

## 2024-06-19 LAB — B12 AND FOLATE PANEL
Folate: >23.7 ng/mL (ref >5.9)
Vitamin B-12: 1089 pg/mL — ABNORMAL HIGH (ref 211–911)

## 2024-06-19 MED ORDER — ALBUTEROL SULFATE HFA 108 (90 BASE) MCG/ACT IN AERS: 1.0000 | INHALATION_SPRAY | RESPIRATORY_TRACT | 1 refills | Status: DC | PRN

## 2024-06-19 MED ORDER — LEVOTHYROXINE SODIUM 112 MCG PO TABS: 112.0000 ug | ORAL_TABLET | Freq: Every day | ORAL | 3 refills | Status: AC

## 2024-06-19 NOTE — Patient Instructions (Addendum)
 Give us  2-3 business days to get the results of your labs back.   Keep the diet clean and stay active.  Stay hydrated.  Try 2 tablespoons of Milk of Magnesia.   Let us  know if you need anything.

## 2024-06-19 NOTE — Telephone Encounter (Signed)
 Pt called and aware still has questions about her labs that I tried to answer but could not. Advised her to keep her appointment next week with dr frann so he can answer her questions

## 2024-06-19 NOTE — Progress Notes (Signed)
 Chief Complaint  Patient presents with   Follow-up    Follow Up    Subjective: Patient is a 73 y.o. female here for f/u.  TSH elevated, T4 WNL. Has s/s's of cold intolerance and fatigue. Compliant w levothyroxine  100 mcg/d. No AE's. Believes her dose should be increased.   Concerned about her labs. Hb low. She had a recent surgery and required abx. No obvious bleeding otherwise. Hx of B12 def and she has not been taking her supp.   Patient reports weighing around 115 pounds 7 months ago.  She has been eating more microwave meals lately out of convenience.  She wants to lose weight.  She is interested in seeing a nutritionist.  She is not wanting to cook at this stage.  Patient has intermittent issues with constipation.  She has tried fiber, MiraLAX without relief.  She states hydrated.  She cannot use enemas/suppositories due to her limited mobility.  No bleeding, nausea/vomiting, or pain.  She also wished to review her medication and supplements.  Past Medical History:  Diagnosis Date   Anxiety    Benzodiazepine dependence (HCC)    Colonic polyp    mulitple polyps. repeat in 05/2019   Constipation    IBS   COPD (chronic obstructive pulmonary disease) (HCC)    Depression    GERD (gastroesophageal reflux disease)    Hyperlipidemia    Hypertension    Insomnia    Migraine headache    OAB (overactive bladder)    Osteoarthritis    Osteoporosis 12/19/2017   Post-surgical hypothyroidism    Pulmonary embolism (HCC)    Eliquis  to stop 05/03/19   Scoliosis     Objective: BP 110/60 (BP Location: Left Arm, Patient Position: Sitting)   Pulse 87   Temp 98 F (36.7 C) (Oral)   Resp 16   Ht 4' 11 (1.499 m)   Wt 129 lb 3.2 oz (58.6 kg)   BMI 26.10 kg/m  General: Awake, appears stated age Lungs: No accessory muscle use Psych: Age appropriate judgment and insight, normal affect and mood  Assessment and Plan: Hypothyroidism, unspecified type  Anemia, unspecified type - Plan:  CBC, IBC + Ferritin, B12 and Folate Panel, Comprehensive metabolic panel with GFR  Overweight (BMI 25.0-29.9) - Plan: Amb ref to Medical Nutrition Therapy-MNT  Chronic constipation  Takes dietary supplements  Chronic, uncontrolled. Increase levothyroxine  to 112 mcg/d. Reck in 6 weeks.  Reck labs. Could be from recent medical happenings. May need to go back on B12.  Refer to nutritionist at her request. Counseled on diet/exercise. She is unwilling to cook at this stage. Stay hydrated. Consider milk of magnesia. Failed MiraLAX, suppository, fiber. We reviewed this as well. The patient voiced understanding and agreement to the plan.  I spent 45 minutes with the patient discussing the above plans in addition to reviewing her chart/labs on the same day of the visit.  Mabel [place], DO 06/19/24  12:42 PM

## 2024-06-20 ENCOUNTER — Ambulatory Visit: Payer: Self-pay | Admitting: Family Medicine

## 2024-06-20 ENCOUNTER — Encounter: Payer: Self-pay | Admitting: Medical

## 2024-06-20 DIAGNOSIS — R7989 Other specified abnormal findings of blood chemistry: Secondary | ICD-10-CM

## 2024-06-21 ENCOUNTER — Encounter: Payer: Self-pay | Admitting: Medical

## 2024-06-21 ENCOUNTER — Encounter: Payer: Self-pay | Admitting: Family Medicine

## 2024-06-21 ENCOUNTER — Ambulatory Visit

## 2024-06-22 ENCOUNTER — Encounter: Payer: Self-pay | Admitting: Medical

## 2024-06-22 DIAGNOSIS — Z9889 Other specified postprocedural states: Secondary | ICD-10-CM | POA: Diagnosis not present

## 2024-06-22 DIAGNOSIS — Z79899 Other long term (current) drug therapy: Secondary | ICD-10-CM | POA: Diagnosis not present

## 2024-06-22 DIAGNOSIS — I952 Hypotension due to drugs: Secondary | ICD-10-CM | POA: Diagnosis not present

## 2024-06-22 DIAGNOSIS — M71379 Other bursal cyst, unspecified ankle and foot: Secondary | ICD-10-CM | POA: Diagnosis not present

## 2024-06-22 DIAGNOSIS — R0602 Shortness of breath: Secondary | ICD-10-CM | POA: Diagnosis not present

## 2024-06-22 DIAGNOSIS — R748 Abnormal levels of other serum enzymes: Secondary | ICD-10-CM | POA: Diagnosis not present

## 2024-06-22 DIAGNOSIS — M79672 Pain in left foot: Secondary | ICD-10-CM | POA: Diagnosis not present

## 2024-06-22 DIAGNOSIS — R9431 Abnormal electrocardiogram [ECG] [EKG]: Secondary | ICD-10-CM | POA: Diagnosis not present

## 2024-06-22 DIAGNOSIS — F17210 Nicotine dependence, cigarettes, uncomplicated: Secondary | ICD-10-CM | POA: Diagnosis not present

## 2024-06-23 ENCOUNTER — Encounter: Payer: Self-pay | Admitting: Family Medicine

## 2024-06-24 ENCOUNTER — Encounter: Payer: Self-pay | Admitting: Family Medicine

## 2024-06-24 NOTE — Telephone Encounter (Signed)
 Initial Comment Caller states she had a surgery on 06/12/24 and is having pain after going to PT. Had surgery on the right foot but is now having pain in her left foot. Translation No Nurse Assessment Nurse: Lida, RN, [place] Date/Time Titus Time): 06/21/2024 6:49:58 PM Confirm and document reason for call. If symptomatic, describe symptoms. ---Caller reports foot pain 10/10. Caller reports had surgery 11/12 for soft tissue mass. Caller reports just feel a little bit sick to stomach, but normal. Does the patient have any new or worsening symptoms? ---Yes Will a triage be completed? ---Yes Related visit to physician within the last 2 weeks? ---Yes Does the PT have any chronic conditions? (i.e. diabetes, asthma, this includes High risk factors for pregnancy, etc.) ---Yes List chronic conditions. ---IBS, HTN-lisinopril , thyroid  dx Is this a behavioral health or substance abuse call? ---No Guidelines Guideline Title Affirmed Question Affirmed Notes Nurse Date/Time (Eastern Time) Post-Op Symptoms and Questions [1] MILDMODERATE postop pain (e.g., pain scale 1-7) AND [2] not controlled with pain medications Lida, RN, [facility] 06/21/2024 7:00:52 PM Blood Pressure - Low Major surgery in the past month Lida OBIE [place] 06/21/2024 7:14:25 PM PLEASE NOTE: All timestamps contained within this report are represented as Eastern Standard Time. CONFIDENTIALTY NOTICE: This fax transmission is intended only for the addressee. It contains information that is legally privileged, confidential or otherwise protected from use or disclosure. If you are not the intended recipient, you are strictly prohibited from reviewing, disclosing, copying using or disseminating any of this information or taking any action in reliance on or regarding this information. If you have received this fax in error, please notify us  immediately by telephone so that we can arrange for its return to us . Phone:  [phone], Toll-Free: [phone], Fax: [phone] SONJA_BOYD 1951-01-06 Page: 1 of3 CallId: [id number] Disp. Time Titus Time) Disposition Final User 06/21/2024 7:13:58 PM Call PCP within 24 Hours [place], [place], [place] 06/21/2024 7:22:36 PM Go to ED Now Yes Lida, RN, [facility] Final Disposition 06/21/2024 7:22:36 PM Go to ED Now Yes Lida, RN, [place] Disagree/Comply Disagree Caller Understands Yes PreDisposition Did not know what to do Care Advice Given Per Guideline ALTERNATE DISPOSITION - CALL SURGEON WITHIN 24 HOURS: * For most post-operative symptoms and questions, it is better to speak with the surgeon than the patient's regular doctor (or NP/PA). PAIN MEDICINES - FOLLOW YOUR SURGEON'S RECOMMENDATIONS: * Follow your surgeon's instructions. Take the pain medicine as directed. * Follow these dosing instructions unless your doctor (or NP/PA) has told you to take a different dose. CALL BACK IF: * Severe pain * Fever over 100.4 F (38.0 C) * You become worse CARE ADVICE per Post-Op Symptoms and Questions (Adult) guideline. GO TO ED NOW: * You need to be seen in the Emergency Department. * Go to the ED at ___________ Hospital. * Leave now. Drive carefully. NOTE TO TRIAGER - DRIVING: * Another adult should drive. * Patient should not delay going to the emergency department. * If immediate transportation is not available via car, rideshare (e.g., Lyft, Uber), or taxi, then the patient should be instructed to call [id number]. BRING MEDICINES: * Bring a list of your current medicines when you go to the Emergency Department (ER). * Bring the pill bottles too. This will help the doctor (or NP/PA) to make certain you are taking the right medicines and the right dose. CARE ADVICE given per Blood Pressure - Low (Adult) guideline. Comments User: Cree Lida, RN Date/Time Titus Time): 06/21/2024 6:57:38 PM Caller reports is seeing  Physical Therapy User: Mitzie Brunt, RN Date/Time  Titus Time): 06/21/2024 7:01:16 PM hx copd User: Mitzie Brunt, RN Date/Time Titus Time): 06/21/2024 7:08:39 PM 100/58 at 1845 User: Mitzie Brunt, RN Date/Time Titus Time): 06/21/2024 7:09:51 PM caller reports cannot take motrin User: Mitzie Brunt, RN Date/Time Titus Time): 06/21/2024 7:21:28 PM pt reporting blood pressures have been 120-150s systolic over last couple days PLEASE NOTE: All timestamps contained within this report are represented as Eastern Standard Time. CONFIDENTIALTY NOTICE: This fax transmission is intended only for the addressee. It contains information that is legally privileged, confidential or otherwise protected from use or disclosure. If you are not the intended recipient, you are strictly prohibited from reviewing, disclosing, copying using or disseminating any of this information or taking any action in reliance on or regarding this information. If you have received this fax in error, please notify us  immediately by telephone so that we can arrange for its return to us . Phone: [phone], Toll-Free: [phone], Fax: [phone] SONJA_BOYD 12-Feb-1951 Page: 2 of3 CallId: [id number] Referrals GO TO FACILITY REFUSED

## 2024-06-25 ENCOUNTER — Ambulatory Visit: Admitting: Family Medicine

## 2024-06-25 ENCOUNTER — Encounter: Payer: Self-pay | Admitting: Family Medicine

## 2024-06-25 ENCOUNTER — Other Ambulatory Visit (HOSPITAL_BASED_OUTPATIENT_CLINIC_OR_DEPARTMENT_OTHER): Payer: Self-pay

## 2024-06-25 ENCOUNTER — Other Ambulatory Visit: Payer: Self-pay

## 2024-06-25 VITALS — BP 128/74 | HR 78 | Temp 98.0°F | Resp 16 | Ht 59.0 in | Wt 134.0 lb

## 2024-06-25 DIAGNOSIS — F411 Generalized anxiety disorder: Secondary | ICD-10-CM | POA: Diagnosis not present

## 2024-06-25 DIAGNOSIS — R0989 Other specified symptoms and signs involving the circulatory and respiratory systems: Secondary | ICD-10-CM | POA: Diagnosis not present

## 2024-06-25 DIAGNOSIS — K581 Irritable bowel syndrome with constipation: Secondary | ICD-10-CM

## 2024-06-25 DIAGNOSIS — I10 Essential (primary) hypertension: Secondary | ICD-10-CM

## 2024-06-25 DIAGNOSIS — R7989 Other specified abnormal findings of blood chemistry: Secondary | ICD-10-CM

## 2024-06-25 DIAGNOSIS — E669 Obesity, unspecified: Secondary | ICD-10-CM

## 2024-06-25 LAB — COMPREHENSIVE METABOLIC PANEL WITH GFR
ALT: 188 U/L — ABNORMAL HIGH (ref 0–35)
AST: 220 U/L — ABNORMAL HIGH (ref 0–37)
Albumin: 4.5 g/dL (ref 3.5–5.2)
Alkaline Phosphatase: 111 U/L (ref 39–117)
BUN: 13 mg/dL (ref 6–23)
CO2: 24 meq/L (ref 19–32)
Calcium: 10.3 mg/dL (ref 8.4–10.5)
Chloride: 98 meq/L (ref 96–112)
Creatinine, Ser: 0.9 mg/dL (ref 0.40–1.20)
GFR: 63.77 mL/min (ref >60.00)
Glucose, Bld: 83 mg/dL (ref 70–99)
Potassium: 4.1 meq/L (ref 3.5–5.1)
Sodium: 128 meq/L — ABNORMAL LOW (ref 135–145)
Total Bilirubin: 0.5 mg/dL (ref 0.2–1.2)
Total Protein: 7.1 g/dL (ref 6.0–8.3)

## 2024-06-25 MED ORDER — BUSPIRONE HCL 5 MG PO TABS: 5.0000 mg | ORAL_TABLET | Freq: Two times a day (BID) | ORAL | 1 refills | Status: DC | PRN

## 2024-06-25 MED ORDER — ALBUTEROL SULFATE HFA 108 (90 BASE) MCG/ACT IN AERS
1.0000 | INHALATION_SPRAY | RESPIRATORY_TRACT | 1 refills | Status: AC | PRN
  Filled 2024-06-25 – 2024-07-09 (×2): qty 13.4, 34d supply, fill #0
  Filled 2024-07-24: qty 13.4, 30d supply, fill #0

## 2024-06-25 MED ORDER — ALBUTEROL SULFATE (2.5 MG/3ML) 0.083% IN NEBU
2.5000 mg | INHALATION_SOLUTION | Freq: Four times a day (QID) | RESPIRATORY_TRACT | 1 refills | Status: AC | PRN
  Filled 2024-06-25: qty 150, 13d supply, fill #0

## 2024-06-25 MED ORDER — AMITRIPTYLINE HCL 10 MG PO TABS: 10.0000 mg | ORAL_TABLET | Freq: Every day | ORAL | 1 refills | Status: DC

## 2024-06-25 NOTE — Patient Instructions (Signed)
Keep the diet clean and stay active.  Aim to do some physical exertion for 150 minutes per week. This is typically divided into 5 days per week, 30 minutes per day. The activity should be enough to get your heart rate up. Anything is better than nothing if you have time constraints.  If you do not hear anything about your referral in the next 1-2 weeks, call our office and ask for an update.  Let us know if you need anything.    

## 2024-06-25 NOTE — Progress Notes (Signed)
 Chief Complaint  Patient presents with   Follow-up    Follow Up    Subjective: Patient is a 73 y.o. female here for f/u. Here w sister who helps with the hx.   Pt has stopped her lisinopril  20 mg/d. She has been taking her BP at home and it has been running in the low 100's/60-70's off of it. Diet oculd be better. She is not exercising routinely. She is not having CP or SOB. Not on any other BP meds.   She has been gaining wt and struggling with it. Diet/exercise as above. She is interested in seeing a nutritionist but her insurance will not cover I.t they will not cover wt loss medication either. She has a hx of HTN, GERD and hyperlipidemia.  She was on prazosin  for sleep which was helpful early on.  For the past few months, it has been less helpful.  She had surgery on her left foot that seem to start a cascade of unfortunate medical situations.  Pain has been limiting her sleep but it is currently controlled.  She has lots of anxiety secondary to her medical situation.  She is also very constipated, along with a history of IBS C.  She tries to stay hydrated and could do better with fiber intake.  Past Medical History:  Diagnosis Date   Anxiety    Benzodiazepine dependence (HCC)    Colonic polyp    mulitple polyps. repeat in 05/2019   Constipation    IBS   COPD (chronic obstructive pulmonary disease) (HCC)    Depression    GERD (gastroesophageal reflux disease)    Hyperlipidemia    Hypertension    Insomnia    Migraine headache    OAB (overactive bladder)    Osteoarthritis    Osteoporosis 12/19/2017   Post-surgical hypothyroidism    Pulmonary embolism (HCC)    Eliquis  to stop 05/03/19   Scoliosis     Objective: BP 128/74 (BP Location: Left Arm, Patient Position: Sitting)   Pulse 78   Temp 98 F (36.7 C) (Oral)   Resp 16   Ht 4' 11 (1.499 m)   Wt 134 lb (60.8 kg)   SpO2 98%   BMI 27.06 kg/m  General: Awake, appears stated age Lungs: No accessory muscle use Psych:  Age appropriate judgment and insight, normal affect and mood  Assessment and Plan: Obesity with serious comorbidity, unspecified class, unspecified obesity type - Plan: Amb Ref to Medical Weight Management  Essential hypertension  GAD (generalized anxiety disorder) - Plan: busPIRone  (BUSPAR ) 5 MG tablet  Irritable bowel syndrome with constipation - Plan: amitriptyline  (ELAVIL ) 10 MG tablet  Refer MWM given BMI >27 + GERD, +HLD. Counseled on diet/exercise. Chronic, stable. Will stay off of lisinopril . Monitor BP intermittently at home. Chronic, not controlled. 2/2 health issues.  She takes Valium  from the psychiatry team.  Add BuSpar  5 mg twice daily as needed. Chronic, likely secondary to #3.  Causing constipation issues.  Amitriptyline  10 mg nightly.  Hopefully this helps with sleep as well.  Follow-up in 1 month to recheck. The patient voiced understanding and agreement to the plan.  I spent 56 min with the pt discussing the above plans in addition to reviewing her chart on the same day of the visit.   Mabel [place], DO 06/25/24  12:37 PM

## 2024-06-25 NOTE — Addendum Note (Signed)
 Addended by: Lelon Ikard M on: 06/25/2024 02:21 PM   Modules accepted: Orders

## 2024-06-26 ENCOUNTER — Ambulatory Visit: Admitting: Family Medicine

## 2024-06-26 ENCOUNTER — Ambulatory Visit: Payer: Self-pay | Admitting: Family Medicine

## 2024-06-26 ENCOUNTER — Encounter: Payer: Self-pay | Admitting: Family Medicine

## 2024-06-26 ENCOUNTER — Other Ambulatory Visit: Payer: Self-pay

## 2024-06-26 DIAGNOSIS — E871 Hypo-osmolality and hyponatremia: Secondary | ICD-10-CM

## 2024-06-26 DIAGNOSIS — K754 Autoimmune hepatitis: Secondary | ICD-10-CM

## 2024-06-26 DIAGNOSIS — Z789 Other specified health status: Secondary | ICD-10-CM

## 2024-06-26 NOTE — Telephone Encounter (Signed)
 Per provider from [facility] pts must have bmi of 30 unless they have had bariatric surgery.

## 2024-06-28 ENCOUNTER — Encounter: Payer: Self-pay | Admitting: Family Medicine

## 2024-06-28 ENCOUNTER — Other Ambulatory Visit: Payer: Self-pay | Admitting: Family Medicine

## 2024-06-28 DIAGNOSIS — Z634 Disappearance and death of family member: Secondary | ICD-10-CM

## 2024-06-28 DIAGNOSIS — K5909 Other constipation: Secondary | ICD-10-CM

## 2024-07-01 ENCOUNTER — Encounter: Admitting: Podiatry

## 2024-07-01 ENCOUNTER — Encounter: Payer: Self-pay | Admitting: Family Medicine

## 2024-07-01 LAB — ANTI-SMOOTH MUSCLE ANTIBODY, IGG: Actin (Smooth Muscle) Antibody (IGG): 41 U — ABNORMAL HIGH (ref <20)

## 2024-07-01 LAB — CERULOPLASMIN: Ceruloplasmin: 29 mg/dL (ref 14–48)

## 2024-07-01 LAB — ANTI-MICROSOMAL ANTIBODY LIVER / KIDNEY: LKM1 Ab: <=20 U (ref <=20.0)

## 2024-07-01 LAB — PTH, INTACT AND CALCIUM
Calcium: 10.1 mg/dL (ref 8.6–10.4)
PTH: 25 pg/mL (ref 16–77)

## 2024-07-01 LAB — HEPATITIS C ANTIBODY: Hepatitis C Ab: NONREACTIVE

## 2024-07-01 LAB — ALPHA-1-ANTITRYPSIN: A-1 Antitrypsin, Ser: 163 mg/dL (ref 83–199)

## 2024-07-01 LAB — HEPATITIS B SURFACE ANTIGEN: Hepatitis B Surface Ag: NONREACTIVE

## 2024-07-02 ENCOUNTER — Encounter: Payer: Self-pay | Admitting: Family Medicine

## 2024-07-02 ENCOUNTER — Encounter: Payer: Self-pay | Admitting: Podiatry

## 2024-07-02 ENCOUNTER — Other Ambulatory Visit (HOSPITAL_BASED_OUTPATIENT_CLINIC_OR_DEPARTMENT_OTHER): Payer: Self-pay

## 2024-07-03 ENCOUNTER — Encounter: Payer: Self-pay | Admitting: Family Medicine

## 2024-07-04 ENCOUNTER — Ambulatory Visit: Admitting: Podiatry

## 2024-07-04 ENCOUNTER — Encounter: Admitting: Podiatry

## 2024-07-04 ENCOUNTER — Encounter: Payer: Self-pay | Admitting: Family Medicine

## 2024-07-04 ENCOUNTER — Ambulatory Visit: Payer: Self-pay | Admitting: Podiatry

## 2024-07-04 DIAGNOSIS — K838 Other specified diseases of biliary tract: Secondary | ICD-10-CM | POA: Diagnosis not present

## 2024-07-04 DIAGNOSIS — D492 Neoplasm of unspecified behavior of bone, soft tissue, and skin: Secondary | ICD-10-CM

## 2024-07-04 DIAGNOSIS — D3613 Benign neoplasm of peripheral nerves and autonomic nervous system of lower limb, including hip: Secondary | ICD-10-CM

## 2024-07-04 DIAGNOSIS — R7989 Other specified abnormal findings of blood chemistry: Secondary | ICD-10-CM | POA: Diagnosis not present

## 2024-07-04 NOTE — Progress Notes (Unsigned)
 Patient presents for post-op visit today, POV#2 DOS 06/12/2024 RT EXC GANGLION/TUMOR  Doing fine. The ace bandage came off, but I kept it on there.  Patient received a prescription from [facility] recently for tramadol  and would like to see if she can get something else sent in in case she runs out. She does have an appt with [facility]. Patient informed that since they prescribed that medication they would be the ones that needs to issue the refill. .  RN Notes: n/a  Vital Signs: Today's Vitals   07/04/24 1105  PainSc: 0-No pain      Radiographs: []  Taken [x]  Not taken  Surgical Site Assessment:  - Dressing:  [x]  Minimal dry blood, intact []  Reinforced   []  Changed     -RN Notes: n/a  - Incision:  [x]  CDI (clean, dry, intact)  []  Mild erythema  []  Drainage noted   -RN Notes: n/a  - Swelling:  []  None  [x]  Mild  []  Moderate   []  Significant     -RN Notes: n/a  - Bruising:  [x]  None  []  Present: n/a   - Sutures/Staples:  []  None [x]  Intact  [x]  Removed Today  []  Plan to remove at next visit   -Cast/Splint/Pins: [x]  None []  Intact []  Removed Today []  Plan to remove at next visit []  Replaced  -Signs of infection:  [x]  None  []  Present - Describe: n/a  -DME:    [x]  None []  AFW []  Surgical shoe []  Cast  []  Splint  -Walking status:  [x]  Full WB  []  Partial WB  []  NWB  -Utilizing device:  [x]  None []  Knee Scooter []  Crutches []  Wheelchair    DVT assessment:  [x]  Denies symptoms []  Chest pain/SOB []  Pain in calf/redness/warmth   Redressed DSD and ace wrap. Educated on signs of infection, proper dressing care, pain management, and weight bearing status. Patient will contact provider with any new or worsening symptoms. The provider assessed the patient today and reviewed instructions regarding plan of care.  -  Patient seen and evaluated by myself.  Sutures removed and incisions healing well.  Scabbing is present there is no open lesions.  No swelling, redness or  any drainage.  No significant pain to the toe.  Advised that she can start to transition to regular shoe gear as tolerated.  Continue ice and elevate.  Monitor for any signs or symptoms of infection.  Reviewed pathology results with her.  No follow-ups on file.  Donnice JONELLE Fees DPM

## 2024-07-05 ENCOUNTER — Other Ambulatory Visit: Payer: Self-pay | Admitting: Family Medicine

## 2024-07-05 ENCOUNTER — Other Ambulatory Visit (HOSPITAL_BASED_OUTPATIENT_CLINIC_OR_DEPARTMENT_OTHER): Payer: Self-pay

## 2024-07-05 DIAGNOSIS — K5909 Other constipation: Secondary | ICD-10-CM

## 2024-07-07 DIAGNOSIS — D3613 Benign neoplasm of peripheral nerves and autonomic nervous system of lower limb, including hip: Secondary | ICD-10-CM | POA: Insufficient documentation

## 2024-07-08 ENCOUNTER — Other Ambulatory Visit: Payer: Self-pay | Admitting: Family Medicine

## 2024-07-08 ENCOUNTER — Encounter: Payer: Self-pay | Admitting: Family Medicine

## 2024-07-08 DIAGNOSIS — K5909 Other constipation: Secondary | ICD-10-CM

## 2024-07-08 NOTE — Telephone Encounter (Signed)
 Pt requesting refill. Looks like it was discontinued on 11/19. Please advise

## 2024-07-09 ENCOUNTER — Encounter: Payer: Self-pay | Admitting: Family Medicine

## 2024-07-09 ENCOUNTER — Telehealth: Payer: Self-pay | Admitting: Family Medicine

## 2024-07-09 ENCOUNTER — Other Ambulatory Visit (HOSPITAL_BASED_OUTPATIENT_CLINIC_OR_DEPARTMENT_OTHER): Payer: Self-pay

## 2024-07-09 NOTE — Telephone Encounter (Signed)
 Hey Courteney, we have the paperwork and it has been placed in Dr. Frann bin for review thanks.

## 2024-07-09 NOTE — Telephone Encounter (Signed)
 Pt dropped off papers for pcp to keep. Pt states she is also sending a messaged to pcp stating she has dropped off forms. Placing forms in pcp's box.

## 2024-07-10 ENCOUNTER — Encounter: Payer: Self-pay | Admitting: Family Medicine

## 2024-07-11 ENCOUNTER — Telehealth: Payer: Self-pay

## 2024-07-11 NOTE — Telephone Encounter (Signed)
 Copied from CRM #[id number]. Topic: General - Other >> Jul 11, 2024  3:51 PM Charlet HERO wrote: Reason for CRM: Dr Cherylle is calling to speak with Dr Frann about the patient call back [phone] asap.

## 2024-07-12 NOTE — Telephone Encounter (Signed)
 Called and spoke w Dr Tawni discussing the pt's case.

## 2024-07-13 ENCOUNTER — Other Ambulatory Visit: Payer: Self-pay | Admitting: Medical

## 2024-07-15 ENCOUNTER — Encounter: Admitting: Podiatry

## 2024-07-18 ENCOUNTER — Other Ambulatory Visit: Payer: Self-pay | Admitting: Family Medicine

## 2024-07-18 DIAGNOSIS — F411 Generalized anxiety disorder: Secondary | ICD-10-CM

## 2024-07-18 DIAGNOSIS — K581 Irritable bowel syndrome with constipation: Secondary | ICD-10-CM

## 2024-07-24 ENCOUNTER — Other Ambulatory Visit (HOSPITAL_BASED_OUTPATIENT_CLINIC_OR_DEPARTMENT_OTHER): Payer: Self-pay

## 2024-07-29 ENCOUNTER — Other Ambulatory Visit (HOSPITAL_BASED_OUTPATIENT_CLINIC_OR_DEPARTMENT_OTHER): Payer: Self-pay

## 2024-08-02 ENCOUNTER — Ambulatory Visit: Admitting: Family Medicine

## 2024-08-05 ENCOUNTER — Other Ambulatory Visit: Payer: Self-pay

## 2024-08-05 ENCOUNTER — Encounter: Payer: Self-pay | Admitting: Family Medicine

## 2024-08-05 ENCOUNTER — Other Ambulatory Visit

## 2024-08-05 MED ORDER — BREZTRI AEROSPHERE 160-9-4.8 MCG/ACT IN AERO: 2.0000 | INHALATION_SPRAY | Freq: Two times a day (BID) | RESPIRATORY_TRACT | 11 refills | Status: AC

## 2024-08-06 ENCOUNTER — Encounter: Payer: Self-pay | Admitting: Family Medicine

## 2024-08-09 ENCOUNTER — Telehealth: Payer: Self-pay | Admitting: Physician Assistant

## 2024-08-09 NOTE — Telephone Encounter (Signed)
 Pt called back stating she does not take Buspar 

## 2024-08-09 NOTE — Telephone Encounter (Signed)
 Is she still taking the Buspar  5 mg as prescribed by Dr. Frann? I recommend increasing it to tid from bid. Continue Valium  as directed.

## 2024-08-09 NOTE — Telephone Encounter (Signed)
 Pt called tearful states she just found out she has cirrhosis of the liver and fibroids. She is at her wits end. Wants to know if teresa will call her in something for her nerves.

## 2024-08-09 NOTE — Telephone Encounter (Signed)
 Rtc to patient who was very tearful discussing the last few months of her health and family issues. Pt reports she has not taken Buspar  in quite awhile.

## 2024-08-09 NOTE — Telephone Encounter (Signed)
 I know pt is already on Diazepam 

## 2024-08-09 NOTE — Telephone Encounter (Signed)
 Correct we had already discussed that and informed Verneita

## 2024-08-12 ENCOUNTER — Encounter: Payer: Self-pay | Admitting: Podiatry

## 2024-08-12 ENCOUNTER — Telehealth: Payer: Self-pay | Admitting: Podiatry

## 2024-08-12 NOTE — Telephone Encounter (Signed)
 Patient called in stating that she saw morphine on a medication list and she would like it taken off.

## 2024-08-14 ENCOUNTER — Other Ambulatory Visit: Payer: Self-pay | Admitting: Family Medicine

## 2024-08-19 ENCOUNTER — Other Ambulatory Visit: Payer: Self-pay

## 2024-08-19 ENCOUNTER — Encounter: Payer: Self-pay | Admitting: Family Medicine

## 2024-08-19 ENCOUNTER — Telehealth: Payer: Self-pay | Admitting: Physician Assistant

## 2024-08-19 ENCOUNTER — Encounter: Payer: Self-pay | Admitting: Podiatry

## 2024-08-19 MED ORDER — GABAPENTIN 300 MG PO CAPS: ORAL_CAPSULE | ORAL | 1 refills | Status: AC

## 2024-08-19 NOTE — Telephone Encounter (Signed)
 Patient is asking for a RF of gabapentin . Brenda Dunn weaned her off in Nov 24 at her request, had reported doing well. She is reporting now that neuropathy is keeping her awake. I asked her what dose she was taking and she said 300 mg TID. This differs from the dose she was taking prior to weaning, which was:  400 mg, 1 capsule BID and 4 capsules at bedtime.   Script also sent to decrease total daily dose of Gabapentin  by one capsule each week until completed. Please let her know that Gabapentin  also is used to treat pain and neuropathy and that if she notices anything like this with decreasing Gabapentin  to contact office. Can continue at lowest possible effective dose if this occurs. Please advise her to contact office with any worsening symptoms or questions.   Pharmacy:  [facility] [address] [address], [place].

## 2024-08-19 NOTE — Telephone Encounter (Signed)
 Patient called in for refill on Gabapentin . She saw JC and has an appt with TH in March. States that she is down to last bottle and needs prescription sent to pharmacy. [facility] [address] [place], [place] appt 3/25

## 2024-08-19 NOTE — Telephone Encounter (Signed)
 Rx for gabapentin  sent as requested. Patient notified of Rx and directions for taking.

## 2024-08-19 NOTE — Telephone Encounter (Signed)
 Please send in 300 mg, #30, 1 po approx 4 hours prior to bedtime. RF 1.   I may need to increase the dose or frequency, but I don't want to start off there.

## 2024-08-20 NOTE — Telephone Encounter (Signed)
 Called pt was advised to just follow up with liver specialist.

## 2024-08-26 ENCOUNTER — Telehealth: Payer: Self-pay

## 2024-08-26 NOTE — Telephone Encounter (Signed)
 Appt scheduled w/ PCP on 09/04/24

## 2024-08-26 NOTE — Telephone Encounter (Signed)
 Initial Comment Caller states she is on a low salt, low sugar diet, due to her liver, her BP is 151/76, she is unsure if she should continue taking her medication , Translation No Nurse Assessment Nurse: Vonzell, RN, Deanna Date/Time (Eastern Time): 08/24/2024 9:06:50 AM Confirm and document reason for call. If symptomatic, describe symptoms. ---Caller states she was seen in ER for vomiting in november 2025. During that visit she was taken off her bp meds due to low bp and was told to follow up with a specialist for elevated liver enzymes. Who she saw and recommended further labs. Patient is currently doing low salt low sugar no medications. 151/76 this morning. Patient is wanting to know if she should start medication. No other symptoms. Does the patient have any new or worsening symptoms? ---Yes Will a triage be completed? ---Yes Related visit to physician within the last 2 weeks? ---Yes Does the PT have any chronic conditions? (i.e. diabetes, asthma, this includes High risk factors for pregnancy, etc.) ---Yes List chronic conditions. ---HTN, elevated liver enzymes, anxiety Is this a behavioral health or substance abuse call? ---No Guidelines Guideline Title Affirmed Question Affirmed Notes Nurse Date/Time (Eastern Time) Blood Pressure - High [1] Systolic BP >= 130 OR Diastolic >= 80 AND [2] taking BP medications Vonzell, RN, Deanna 08/24/2024 9:11:43 AM PLEASE NOTE: All timestamps contained within this report are represented as Eastern Standard Time. CONFIDENTIALTY NOTICE: This fax transmission is intended only for the addressee. It contains information that is legally privileged, confidential or otherwise protected from use or disclosure. If you are not the intended recipient, you are strictly prohibited from reviewing, disclosing, copying using or disseminating any of this information or taking any action in reliance on or regarding this information. If you have received  this fax in error, please notify us  immediately by telephone so that we can arrange for its return to us . Phone: [phone], Toll-Free: [phone], Fax: [phone] SONJA_BOYD [birth year] Page: 1 of2 CallId: [id number] Disp. Time Titus Time) Disposition Final User 08/24/2024 9:14:39 AM See PCP within 2 Weeks Yes Vonzell, RN, Deanna Final Disposition 08/24/2024 9:14:39 AM See PCP within 2 Weeks Yes Vonzell, RN, Deanna Caller Disagree/Comply Comply Caller Understands Yes PreDisposition 911 Care Advice Given Per Guideline SEE PCP WITHIN 2 WEEKS: * You need to be seen for this ongoing problem within the next 2 weeks. * PCP VISIT: Call your doctor (or NP/PA) during regular office hours and make an appointment. * Your blood pressure is high but you have said that you are not having any symptoms. * You should see your doctor (or NP/PA) and have your blood pressure checked within 2 weeks. * You might need to have a change in your medicine(s). * Weakness or numbness of the face, arm or leg on one side of the body occurs * Difficulty walking, difficulty talking, or severe headache occurs * Chest pain or difficulty breathing occurs * Your blood pressure is over 160/100 * You become worse Referrals REFERRED TO PCP OFFICE

## 2024-08-29 ENCOUNTER — Telehealth: Payer: Self-pay | Admitting: Family Medicine

## 2024-08-29 NOTE — Telephone Encounter (Signed)
 Called pt was advised she would need to video visit, Pt was advised the weather should be better  by March.

## 2024-08-29 NOTE — Telephone Encounter (Signed)
 Copied from CRM #[id number]. Topic: Appointments - Scheduling Inquiry for Clinic >> Aug 29, 2024  9:27 AM Donna BRAVO wrote: Reason for CRM:  Patient asking  for telephone appt 09/04/24 due to weather. Patient does not drive in this weather. patient is unable to do video appt  Patient provided this BP information  Date             BP              Time 01/22           141/73        8:15am 01/23           145/75        9:50am 01/24           152/76        8:14am 01/25           135/71        8:15am 01/26           157/75        8:07am 01/27           150/76        8:11am 01/28           143/83        9:06am 01/29           159/87        8:15am   Patient wanted to inform Dr Frann about medications  Patient has lisinopril  (PRINIVIL ,ZESTRIL ) 40 mg tablet has a few in one bottle and a new bottle that has not been opened

## 2024-08-30 ENCOUNTER — Ambulatory Visit: Admitting: Podiatry

## 2024-09-03 ENCOUNTER — Other Ambulatory Visit: Payer: Self-pay | Admitting: Family Medicine

## 2024-09-03 DIAGNOSIS — Z634 Disappearance and death of family member: Secondary | ICD-10-CM

## 2024-09-04 ENCOUNTER — Ambulatory Visit: Admitting: Family Medicine

## 2024-10-01 ENCOUNTER — Ambulatory Visit: Admitting: Family Medicine

## 2024-10-23 ENCOUNTER — Ambulatory Visit: Admitting: Physician Assistant
